# Patient Record
Sex: Female | Born: 1935
Health system: Southern US, Community
[De-identification: ages and names within clinical notes are randomized; demographics above are authoritative.]

## PROBLEM LIST (undated history)

## (undated) DIAGNOSIS — R7302 Impaired glucose tolerance (oral): Secondary | ICD-10-CM

## (undated) DIAGNOSIS — I700 Atherosclerosis of aorta: Secondary | ICD-10-CM

## (undated) DIAGNOSIS — R892 Abnormal level of other drugs, medicaments and biological substances in specimens from other organs, systems and tissues: Secondary | ICD-10-CM

## (undated) DIAGNOSIS — S2239XA Fracture of one rib, unspecified side, initial encounter for closed fracture: Secondary | ICD-10-CM

## (undated) DIAGNOSIS — M5136 Other intervertebral disc degeneration, lumbar region: Secondary | ICD-10-CM

## (undated) DIAGNOSIS — Z8719 Personal history of other diseases of the digestive system: Secondary | ICD-10-CM

## (undated) DIAGNOSIS — Z8673 Personal history of transient ischemic attack (TIA), and cerebral infarction without residual deficits: Secondary | ICD-10-CM

## (undated) DIAGNOSIS — M810 Age-related osteoporosis without current pathological fracture: Secondary | ICD-10-CM

## (undated) DIAGNOSIS — N393 Stress incontinence (female) (male): Secondary | ICD-10-CM

## (undated) DIAGNOSIS — J181 Lobar pneumonia, unspecified organism: Secondary | ICD-10-CM

## (undated) DIAGNOSIS — N184 Chronic kidney disease, stage 4 (severe): Secondary | ICD-10-CM

## (undated) DIAGNOSIS — F419 Anxiety disorder, unspecified: Secondary | ICD-10-CM

## (undated) DIAGNOSIS — I5189 Other ill-defined heart diseases: Secondary | ICD-10-CM

## (undated) DIAGNOSIS — R4189 Other symptoms and signs involving cognitive functions and awareness: Secondary | ICD-10-CM

## (undated) DIAGNOSIS — K5790 Diverticulosis of intestine, part unspecified, without perforation or abscess without bleeding: Secondary | ICD-10-CM

## (undated) DIAGNOSIS — K589 Irritable bowel syndrome without diarrhea: Secondary | ICD-10-CM

## (undated) DIAGNOSIS — J449 Chronic obstructive pulmonary disease, unspecified: Secondary | ICD-10-CM

## (undated) DIAGNOSIS — F32A Depression, unspecified: Secondary | ICD-10-CM

## (undated) DIAGNOSIS — M51369 Other intervertebral disc degeneration, lumbar region without mention of lumbar back pain or lower extremity pain: Secondary | ICD-10-CM

## (undated) DIAGNOSIS — K219 Gastro-esophageal reflux disease without esophagitis: Secondary | ICD-10-CM

## (undated) DIAGNOSIS — F329 Major depressive disorder, single episode, unspecified: Secondary | ICD-10-CM

## (undated) DIAGNOSIS — I10 Essential (primary) hypertension: Secondary | ICD-10-CM

## (undated) DIAGNOSIS — E785 Hyperlipidemia, unspecified: Secondary | ICD-10-CM

## (undated) HISTORY — DX: Chronic kidney disease, stage 4 (severe): N18.4

## (undated) HISTORY — DX: Chronic obstructive pulmonary disease, unspecified: J44.9

## (undated) HISTORY — DX: Stress incontinence (female) (male): N39.3

## (undated) HISTORY — PX: OTHER SURGICAL HISTORY: SHX169

## (undated) HISTORY — DX: Age-related osteoporosis without current pathological fracture: M81.0

## (undated) HISTORY — DX: Other ill-defined heart diseases: I51.89

## (undated) HISTORY — DX: Atherosclerosis of aorta: I70.0

## (undated) HISTORY — DX: Essential (primary) hypertension: I10

## (undated) HISTORY — DX: Hyperlipidemia, unspecified: E78.5

## (undated) HISTORY — DX: Impaired glucose tolerance (oral): R73.02

## (undated) HISTORY — DX: Major depressive disorder, single episode, unspecified: F32.9

## (undated) HISTORY — DX: Personal history of other diseases of the digestive system: Z87.19

## (undated) HISTORY — DX: Anxiety disorder, unspecified: F41.9

## (undated) HISTORY — DX: Other intervertebral disc degeneration, lumbar region: M51.36

## (undated) HISTORY — DX: Fracture of one rib, unspecified side, initial encounter for closed fracture: S22.39XA

## (undated) HISTORY — DX: Lobar pneumonia, unspecified organism: J18.1

## (undated) HISTORY — DX: Other intervertebral disc degeneration, lumbar region without mention of lumbar back pain or lower extremity pain: M51.369

## (undated) HISTORY — DX: Personal history of transient ischemic attack (TIA), and cerebral infarction without residual deficits: Z86.73

## (undated) HISTORY — DX: Irritable bowel syndrome, unspecified: K58.9

## (undated) HISTORY — DX: Depression, unspecified: F32.A

## (undated) HISTORY — DX: Diverticulosis of intestine, part unspecified, without perforation or abscess without bleeding: K57.90

## (undated) HISTORY — DX: Other symptoms and signs involving cognitive functions and awareness: R41.89

## (undated) HISTORY — DX: Gastro-esophageal reflux disease without esophagitis: K21.9

## (undated) HISTORY — DX: Abnormal level of other drugs, medicaments and biological substances in specimens from other organs, systems and tissues: R89.2

---
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
## 1960-01-19 HISTORY — PX: OTHER SURGICAL HISTORY: SHX169

## 1971-01-19 HISTORY — PX: TUBAL LIGATION: SHX77

## 1980-01-19 HISTORY — PX: BREAST ENHANCEMENT SURGERY: SHX7

## 1993-10-09 ENCOUNTER — Encounter: Payer: Self-pay | Admitting: Family Medicine

## 1994-01-18 HISTORY — PX: TOTAL ABDOMINAL HYSTERECTOMY: SHX209

## 1997-09-19 ENCOUNTER — Ambulatory Visit (HOSPITAL_COMMUNITY): Admission: RE | Admit: 1997-09-19 | Discharge: 1997-09-19 | Payer: Self-pay | Admitting: Internal Medicine

## 1998-09-30 ENCOUNTER — Ambulatory Visit (HOSPITAL_COMMUNITY): Admission: RE | Admit: 1998-09-30 | Discharge: 1998-09-30 | Payer: Self-pay | Admitting: Internal Medicine

## 1998-09-30 ENCOUNTER — Encounter: Payer: Self-pay | Admitting: Internal Medicine

## 2000-01-19 HISTORY — PX: COLONOSCOPY: SHX174

## 2000-03-09 ENCOUNTER — Ambulatory Visit (HOSPITAL_COMMUNITY): Admission: RE | Admit: 2000-03-09 | Discharge: 2000-03-09 | Payer: Self-pay | Admitting: Internal Medicine

## 2000-05-02 ENCOUNTER — Encounter: Payer: Self-pay | Admitting: Family Medicine

## 2000-05-03 ENCOUNTER — Ambulatory Visit (HOSPITAL_COMMUNITY): Admission: RE | Admit: 2000-05-03 | Discharge: 2000-05-03 | Payer: Self-pay | Admitting: Gastroenterology

## 2000-05-03 ENCOUNTER — Encounter: Payer: Self-pay | Admitting: Gastroenterology

## 2001-04-10 ENCOUNTER — Ambulatory Visit (HOSPITAL_COMMUNITY): Admission: RE | Admit: 2001-04-10 | Discharge: 2001-04-10 | Payer: Self-pay | Admitting: Internal Medicine

## 2002-01-07 ENCOUNTER — Encounter: Payer: Self-pay | Admitting: Internal Medicine

## 2002-01-07 ENCOUNTER — Encounter: Admission: RE | Admit: 2002-01-07 | Discharge: 2002-01-07 | Payer: Self-pay | Admitting: Internal Medicine

## 2002-04-30 ENCOUNTER — Encounter: Payer: Self-pay | Admitting: Internal Medicine

## 2002-04-30 ENCOUNTER — Ambulatory Visit (HOSPITAL_COMMUNITY): Admission: RE | Admit: 2002-04-30 | Discharge: 2002-04-30 | Payer: Self-pay | Admitting: Internal Medicine

## 2004-08-11 ENCOUNTER — Ambulatory Visit: Payer: Self-pay | Admitting: Internal Medicine

## 2004-08-17 ENCOUNTER — Ambulatory Visit: Payer: Self-pay | Admitting: Internal Medicine

## 2004-08-28 ENCOUNTER — Ambulatory Visit (HOSPITAL_COMMUNITY): Admission: RE | Admit: 2004-08-28 | Discharge: 2004-08-28 | Payer: Self-pay | Admitting: Internal Medicine

## 2005-09-06 ENCOUNTER — Ambulatory Visit: Payer: Self-pay | Admitting: Internal Medicine

## 2005-09-23 ENCOUNTER — Ambulatory Visit: Payer: Self-pay | Admitting: Internal Medicine

## 2005-09-23 ENCOUNTER — Ambulatory Visit (HOSPITAL_COMMUNITY): Admission: RE | Admit: 2005-09-23 | Discharge: 2005-09-23 | Payer: Self-pay | Admitting: Internal Medicine

## 2005-09-27 ENCOUNTER — Ambulatory Visit: Payer: Self-pay | Admitting: Internal Medicine

## 2006-10-04 ENCOUNTER — Ambulatory Visit: Payer: Self-pay | Admitting: Internal Medicine

## 2006-10-04 LAB — CONVERTED CEMR LAB
ALT: 21 units/L (ref 0–35)
Alkaline Phosphatase: 70 units/L (ref 39–117)
BUN: 21 mg/dL (ref 6–23)
Basophils Relative: 0.4 % (ref 0.0–1.0)
Bilirubin, Direct: 0.1 mg/dL (ref 0.0–0.3)
CO2: 25 meq/L (ref 19–32)
Calcium: 9.6 mg/dL (ref 8.4–10.5)
Cholesterol: 206 mg/dL (ref 0–200)
Creatinine, Ser: 1.3 mg/dL — ABNORMAL HIGH (ref 0.4–1.2)
Eosinophils Relative: 2 % (ref 0.0–5.0)
Glucose, Bld: 92 mg/dL (ref 70–99)
Hemoglobin: 13.3 g/dL (ref 12.0–15.0)
Lymphocytes Relative: 28.2 % (ref 12.0–46.0)
Monocytes Absolute: 0.6 10*3/uL (ref 0.2–0.7)
Monocytes Relative: 7.6 % (ref 3.0–11.0)
Potassium: 4.7 meq/L (ref 3.5–5.1)
RDW: 12.7 % (ref 11.5–14.6)
Total Bilirubin: 0.8 mg/dL (ref 0.3–1.2)
Total CHOL/HDL Ratio: 2.2
Total Protein: 6.3 g/dL (ref 6.0–8.3)
VLDL: 9 mg/dL (ref 0–40)
WBC: 7.8 10*3/uL (ref 4.5–10.5)

## 2006-10-07 ENCOUNTER — Ambulatory Visit (HOSPITAL_COMMUNITY): Admission: RE | Admit: 2006-10-07 | Discharge: 2006-10-07 | Payer: Self-pay | Admitting: Internal Medicine

## 2007-02-24 ENCOUNTER — Encounter: Payer: Self-pay | Admitting: Internal Medicine

## 2007-04-05 ENCOUNTER — Encounter: Payer: Self-pay | Admitting: Internal Medicine

## 2007-04-06 ENCOUNTER — Telehealth: Payer: Self-pay | Admitting: Internal Medicine

## 2007-05-01 ENCOUNTER — Telehealth: Payer: Self-pay | Admitting: Internal Medicine

## 2007-05-09 ENCOUNTER — Encounter: Payer: Self-pay | Admitting: Internal Medicine

## 2007-05-10 ENCOUNTER — Encounter: Payer: Self-pay | Admitting: Internal Medicine

## 2007-05-17 ENCOUNTER — Encounter: Payer: Self-pay | Admitting: Internal Medicine

## 2007-05-19 ENCOUNTER — Ambulatory Visit: Payer: Self-pay | Admitting: Internal Medicine

## 2007-05-19 DIAGNOSIS — G4700 Insomnia, unspecified: Secondary | ICD-10-CM

## 2007-05-19 DIAGNOSIS — E739 Lactose intolerance, unspecified: Secondary | ICD-10-CM

## 2007-05-19 DIAGNOSIS — M545 Low back pain: Secondary | ICD-10-CM

## 2007-05-19 DIAGNOSIS — M810 Age-related osteoporosis without current pathological fracture: Secondary | ICD-10-CM

## 2007-06-14 ENCOUNTER — Encounter: Payer: Self-pay | Admitting: Internal Medicine

## 2007-08-29 ENCOUNTER — Encounter: Payer: Self-pay | Admitting: Internal Medicine

## 2007-09-06 ENCOUNTER — Ambulatory Visit: Payer: Self-pay | Admitting: Vascular Surgery

## 2007-09-26 ENCOUNTER — Encounter: Payer: Self-pay | Admitting: Internal Medicine

## 2007-10-10 ENCOUNTER — Ambulatory Visit (HOSPITAL_COMMUNITY): Admission: RE | Admit: 2007-10-10 | Discharge: 2007-10-10 | Payer: Self-pay | Admitting: Internal Medicine

## 2007-12-05 ENCOUNTER — Encounter: Payer: Self-pay | Admitting: Family Medicine

## 2008-03-18 HISTORY — PX: OTHER SURGICAL HISTORY: SHX169

## 2008-04-10 ENCOUNTER — Ambulatory Visit: Payer: Self-pay | Admitting: Urology

## 2008-04-11 ENCOUNTER — Encounter: Payer: Self-pay | Admitting: Family Medicine

## 2008-04-15 ENCOUNTER — Ambulatory Visit: Payer: Self-pay | Admitting: Urology

## 2008-06-12 ENCOUNTER — Encounter: Payer: Self-pay | Admitting: Family Medicine

## 2008-06-13 ENCOUNTER — Encounter: Admission: RE | Admit: 2008-06-13 | Discharge: 2008-06-13 | Payer: Self-pay | Admitting: Family Medicine

## 2008-06-13 ENCOUNTER — Encounter: Payer: Self-pay | Admitting: Family Medicine

## 2008-07-04 ENCOUNTER — Encounter: Payer: Self-pay | Admitting: Family Medicine

## 2008-07-30 ENCOUNTER — Encounter: Payer: Self-pay | Admitting: Family Medicine

## 2008-08-14 ENCOUNTER — Encounter: Payer: Self-pay | Admitting: Family Medicine

## 2008-08-15 ENCOUNTER — Encounter: Payer: Self-pay | Admitting: Family Medicine

## 2008-08-19 ENCOUNTER — Ambulatory Visit: Payer: Self-pay | Admitting: Family Medicine

## 2008-08-19 LAB — CONVERTED CEMR LAB
ALT: 17 units/L (ref 0–35)
AST: 21 units/L (ref 0–37)
Basophils Relative: 1.3 % (ref 0.0–3.0)
Chloride: 110 meq/L (ref 96–112)
Eosinophils Relative: 4.5 % (ref 0.0–5.0)
GFR calc non Af Amer: 36.04 mL/min (ref >60)
HCT: 34.1 % — ABNORMAL LOW (ref 36.0–46.0)
Hemoglobin: 11.7 g/dL — ABNORMAL LOW (ref 12.0–15.0)
LDL Cholesterol: 96 mg/dL (ref 0–99)
Lymphs Abs: 2.3 10*3/uL (ref 0.7–4.0)
Monocytes Relative: 10.2 % (ref 3.0–12.0)
Neutro Abs: 3 10*3/uL (ref 1.4–7.7)
Platelets: 330 10*3/uL (ref 150.0–400.0)
Potassium: 5.2 meq/L — ABNORMAL HIGH (ref 3.5–5.1)
RBC: 3.68 M/uL — ABNORMAL LOW (ref 3.87–5.11)
Sodium: 142 meq/L (ref 135–145)
TSH: 3.49 microintl units/mL (ref 0.35–5.50)
Total Bilirubin: 0.7 mg/dL (ref 0.3–1.2)
Total CHOL/HDL Ratio: 2
Total Protein: 7.2 g/dL (ref 6.0–8.3)
VLDL: 10.2 mg/dL (ref 0.0–40.0)
WBC: 6.4 10*3/uL (ref 4.5–10.5)

## 2008-08-20 LAB — CONVERTED CEMR LAB: Vit D, 25-Hydroxy: 20 ng/mL — ABNORMAL LOW (ref 30–89)

## 2008-08-21 ENCOUNTER — Ambulatory Visit: Payer: Self-pay | Admitting: Family Medicine

## 2008-08-21 DIAGNOSIS — N189 Chronic kidney disease, unspecified: Secondary | ICD-10-CM

## 2008-08-21 DIAGNOSIS — D631 Anemia in chronic kidney disease: Secondary | ICD-10-CM

## 2008-08-21 DIAGNOSIS — E559 Vitamin D deficiency, unspecified: Secondary | ICD-10-CM

## 2008-09-03 ENCOUNTER — Encounter: Payer: Self-pay | Admitting: Family Medicine

## 2008-09-11 ENCOUNTER — Telehealth: Payer: Self-pay | Admitting: Family Medicine

## 2008-09-13 HISTORY — PX: OTHER SURGICAL HISTORY: SHX169

## 2008-09-25 ENCOUNTER — Ambulatory Visit: Payer: Self-pay | Admitting: Family Medicine

## 2008-09-25 LAB — CONVERTED CEMR LAB
Ketones, urine, test strip: NEGATIVE
Urobilinogen, UA: 0.2

## 2008-09-26 ENCOUNTER — Encounter (INDEPENDENT_AMBULATORY_CARE_PROVIDER_SITE_OTHER): Payer: Self-pay | Admitting: Internal Medicine

## 2008-10-01 ENCOUNTER — Telehealth (INDEPENDENT_AMBULATORY_CARE_PROVIDER_SITE_OTHER): Payer: Self-pay | Admitting: Internal Medicine

## 2008-10-02 ENCOUNTER — Ambulatory Visit: Payer: Self-pay | Admitting: Family Medicine

## 2008-10-03 ENCOUNTER — Encounter: Payer: Self-pay | Admitting: Family Medicine

## 2008-10-05 LAB — CONVERTED CEMR LAB
Basophils Relative: 1 % (ref 0.0–3.0)
Chloride: 113 meq/L — ABNORMAL HIGH (ref 96–112)
Eosinophils Relative: 6 % — ABNORMAL HIGH (ref 0.0–5.0)
Hgb A1c MFr Bld: 5.6 % (ref 4.6–6.5)
Lymphocytes Relative: 35.5 % (ref 12.0–46.0)
MCV: 95.6 fL (ref 78.0–100.0)
Neutrophils Relative %: 48.4 % (ref 43.0–77.0)
Potassium: 4.5 meq/L (ref 3.5–5.1)
RBC: 3.46 M/uL — ABNORMAL LOW (ref 3.87–5.11)
Sodium: 142 meq/L (ref 135–145)
Vitamin B-12: 458 pg/mL (ref 211–911)
WBC: 6 10*3/uL (ref 4.5–10.5)

## 2008-10-08 ENCOUNTER — Telehealth: Payer: Self-pay | Admitting: Family Medicine

## 2008-10-09 ENCOUNTER — Ambulatory Visit: Payer: Self-pay | Admitting: Family Medicine

## 2008-10-14 ENCOUNTER — Encounter: Payer: Self-pay | Admitting: Family Medicine

## 2008-10-30 ENCOUNTER — Ambulatory Visit: Payer: Self-pay | Admitting: Family Medicine

## 2008-11-19 ENCOUNTER — Ambulatory Visit (HOSPITAL_COMMUNITY): Admission: RE | Admit: 2008-11-19 | Discharge: 2008-11-19 | Payer: Self-pay | Admitting: Family Medicine

## 2008-12-16 ENCOUNTER — Encounter: Payer: Self-pay | Admitting: Family Medicine

## 2008-12-18 ENCOUNTER — Telehealth: Payer: Self-pay | Admitting: Family Medicine

## 2008-12-23 ENCOUNTER — Ambulatory Visit: Payer: Self-pay | Admitting: Family Medicine

## 2008-12-23 LAB — CONVERTED CEMR LAB
Basophils Relative: 1.7 % (ref 0.0–3.0)
Eosinophils Relative: 5.8 % — ABNORMAL HIGH (ref 0.0–5.0)
HCT: 33.3 % — ABNORMAL LOW (ref 36.0–46.0)
Lymphs Abs: 2.9 10*3/uL (ref 0.7–4.0)
MCV: 97.3 fL (ref 78.0–100.0)
Monocytes Absolute: 0.9 10*3/uL (ref 0.1–1.0)
Neutrophils Relative %: 37.6 % — ABNORMAL LOW (ref 43.0–77.0)
RBC: 3.42 M/uL — ABNORMAL LOW (ref 3.87–5.11)
Vitamin B-12: 400 pg/mL (ref 211–911)
WBC: 6.9 10*3/uL (ref 4.5–10.5)

## 2009-01-02 ENCOUNTER — Encounter: Payer: Self-pay | Admitting: Family Medicine

## 2009-01-21 ENCOUNTER — Encounter: Payer: Self-pay | Admitting: Family Medicine

## 2009-01-21 ENCOUNTER — Telehealth: Payer: Self-pay | Admitting: Family Medicine

## 2009-01-27 ENCOUNTER — Telehealth: Payer: Self-pay | Admitting: Family Medicine

## 2009-02-18 ENCOUNTER — Encounter: Payer: Self-pay | Admitting: Family Medicine

## 2009-03-04 ENCOUNTER — Telehealth: Payer: Self-pay | Admitting: Family Medicine

## 2009-03-17 ENCOUNTER — Telehealth: Payer: Self-pay | Admitting: Family Medicine

## 2009-04-02 ENCOUNTER — Telehealth: Payer: Self-pay | Admitting: Family Medicine

## 2009-06-02 ENCOUNTER — Telehealth: Payer: Self-pay | Admitting: Family Medicine

## 2009-07-04 ENCOUNTER — Telehealth: Payer: Self-pay | Admitting: Family Medicine

## 2009-08-14 ENCOUNTER — Encounter: Payer: Self-pay | Admitting: Family Medicine

## 2009-08-26 ENCOUNTER — Encounter (INDEPENDENT_AMBULATORY_CARE_PROVIDER_SITE_OTHER): Payer: Self-pay | Admitting: *Deleted

## 2009-09-09 ENCOUNTER — Ambulatory Visit: Payer: Self-pay | Admitting: Family Medicine

## 2009-09-10 LAB — CONVERTED CEMR LAB
ALT: 14 units/L (ref 0–35)
AST: 17 units/L (ref 0–37)
Albumin: 4.2 g/dL (ref 3.5–5.2)
BUN: 24 mg/dL — ABNORMAL HIGH (ref 6–23)
Basophils Relative: 1.2 % (ref 0.0–3.0)
Chloride: 104 meq/L (ref 96–112)
Eosinophils Relative: 3.2 % (ref 0.0–5.0)
HCT: 34.5 % — ABNORMAL LOW (ref 36.0–46.0)
Hemoglobin: 11.5 g/dL — ABNORMAL LOW (ref 12.0–15.0)
Lipase: 37 units/L (ref 11.0–59.0)
Lymphs Abs: 1.9 10*3/uL (ref 0.7–4.0)
MCV: 95.1 fL (ref 78.0–100.0)
Monocytes Absolute: 0.7 10*3/uL (ref 0.1–1.0)
Neutro Abs: 3.5 10*3/uL (ref 1.4–7.7)
Platelets: 283 10*3/uL (ref 150.0–400.0)
Potassium: 5 meq/L (ref 3.5–5.1)
Sodium: 140 meq/L (ref 135–145)
Total Bilirubin: 0.5 mg/dL (ref 0.3–1.2)
Total Protein: 6.4 g/dL (ref 6.0–8.3)
WBC: 6.4 10*3/uL (ref 4.5–10.5)

## 2009-09-15 ENCOUNTER — Telehealth: Payer: Self-pay | Admitting: Family Medicine

## 2009-09-23 ENCOUNTER — Ambulatory Visit: Payer: Self-pay | Admitting: Internal Medicine

## 2009-09-26 ENCOUNTER — Encounter: Payer: Self-pay | Admitting: Family Medicine

## 2009-09-29 ENCOUNTER — Telehealth (INDEPENDENT_AMBULATORY_CARE_PROVIDER_SITE_OTHER): Payer: Self-pay | Admitting: *Deleted

## 2009-10-20 ENCOUNTER — Telehealth (INDEPENDENT_AMBULATORY_CARE_PROVIDER_SITE_OTHER): Payer: Self-pay | Admitting: *Deleted

## 2009-10-20 ENCOUNTER — Telehealth: Payer: Self-pay | Admitting: Family Medicine

## 2009-11-27 ENCOUNTER — Telehealth: Payer: Self-pay | Admitting: Family Medicine

## 2009-12-01 ENCOUNTER — Ambulatory Visit: Payer: Self-pay | Admitting: Family Medicine

## 2009-12-01 DIAGNOSIS — N184 Chronic kidney disease, stage 4 (severe): Secondary | ICD-10-CM

## 2009-12-10 ENCOUNTER — Encounter (INDEPENDENT_AMBULATORY_CARE_PROVIDER_SITE_OTHER): Payer: Self-pay | Admitting: *Deleted

## 2009-12-23 ENCOUNTER — Ambulatory Visit: Payer: Self-pay | Admitting: Family Medicine

## 2009-12-25 LAB — CONVERTED CEMR LAB
Alkaline Phosphatase: 69 units/L (ref 39–117)
Basophils Relative: 1.4 % (ref 0.0–3.0)
Bilirubin, Direct: 0.1 mg/dL (ref 0.0–0.3)
Calcium: 9.2 mg/dL (ref 8.4–10.5)
Creatinine, Ser: 1.6 mg/dL — ABNORMAL HIGH (ref 0.4–1.2)
Eosinophils Absolute: 0.3 10*3/uL (ref 0.0–0.7)
GFR calc non Af Amer: 33.82 mL/min — ABNORMAL LOW (ref >60.00)
HDL: 74.8 mg/dL (ref >39.00)
Hemoglobin: 11.6 g/dL — ABNORMAL LOW (ref 12.0–15.0)
Lymphocytes Relative: 43.1 % (ref 12.0–46.0)
MCHC: 33.3 g/dL (ref 30.0–36.0)
MCV: 94.7 fL (ref 78.0–100.0)
Neutro Abs: 2.5 10*3/uL (ref 1.4–7.7)
RBC: 3.68 M/uL — ABNORMAL LOW (ref 3.87–5.11)
Sodium: 143 meq/L (ref 135–145)
Total Bilirubin: 0.8 mg/dL (ref 0.3–1.2)
Total CHOL/HDL Ratio: 3
Triglycerides: 50 mg/dL (ref 0.0–149.0)
VLDL: 10 mg/dL (ref 0.0–40.0)

## 2010-01-18 DIAGNOSIS — I5189 Other ill-defined heart diseases: Secondary | ICD-10-CM

## 2010-01-18 DIAGNOSIS — Z8673 Personal history of transient ischemic attack (TIA), and cerebral infarction without residual deficits: Secondary | ICD-10-CM

## 2010-01-18 HISTORY — DX: Personal history of transient ischemic attack (TIA), and cerebral infarction without residual deficits: Z86.73

## 2010-01-18 HISTORY — DX: Other ill-defined heart diseases: I51.89

## 2010-01-18 HISTORY — PX: ESOPHAGOGASTRODUODENOSCOPY: SHX1529

## 2010-02-07 ENCOUNTER — Encounter: Payer: Self-pay | Admitting: Family Medicine

## 2010-02-08 ENCOUNTER — Encounter: Payer: Self-pay | Admitting: Internal Medicine

## 2010-02-09 ENCOUNTER — Encounter: Payer: Self-pay | Admitting: Family Medicine

## 2010-02-16 ENCOUNTER — Encounter (INDEPENDENT_AMBULATORY_CARE_PROVIDER_SITE_OTHER): Payer: Self-pay | Admitting: *Deleted

## 2010-02-16 ENCOUNTER — Encounter: Payer: Self-pay | Admitting: Family Medicine

## 2010-02-16 LAB — CONVERTED CEMR LAB
ALT: 15 units/L
Alkaline Phosphatase: 61 units/L
CO2: 22 meq/L
Cholesterol: 185 mg/dL
LDL Cholesterol: 95 mg/dL
Sodium: 139 meq/L
Total Bilirubin: 0.3 mg/dL
Total Protein: 6.3 g/dL
Triglycerides: 56 mg/dL

## 2010-02-17 ENCOUNTER — Ambulatory Visit: Payer: Self-pay | Admitting: Cardiology

## 2010-02-19 NOTE — Letter (Signed)
Summary: Dr.Emilie Carp [facility],Urology,Note  Dr.Sabree Nuon Evans,Urology,Note   Imported By: Virgia Land 02/10/2009 09:27:33  _____________________________________________________________________  External Attachment:    Type:   Image     Comment:   External Document

## 2010-02-19 NOTE — Assessment & Plan Note (Signed)
Summary: ROA FOR 2 MONTH FOLLOW-UP/JRR   Vital Signs:  Patient profile:   77 year old female Height:      63 inches Weight:      123.25 pounds BMI:     21.91 Temp:     98.4 degrees F oral Pulse rate:   60 / minute Pulse rhythm:   regular BP sitting:   110 / 62  (left arm) Cuff size:   regular  Vitals Entered By: Ozzie Hoyle LPN (November 14, 624THL 12:26 PM) CC: 2 mont f/u   History of Present Illness: CC: f/u stomach, CPE  recent trip to [place] working on bylaws.    1. Stomache - seems better since on omeprazole.  thinks had virus for 1 mo, now better.  appetite returned in [place].  + soreness ribcage better after chiropractor put rib back in place and feeling better.  off omeprazole.  2. osteoporosis- takes sea calcium/vit D 1000IU.  last Dexa 2005 osteoporosis.  hasn't had vit D checked in a while.  3. HTN - per cards.  no HA, vision changes, chest pain, tightness, urinary changes, LE swelling.    4. HLD - very mild hx.  not on meds. takes coQ for gums/heart.    Preventative: flu shot UTD, tetanus shot UTD, pneumovax UTD, zostavax UTD. no paps - hysterectomy.  hasn't had recent pelvic. mammogram scheduled tomorrow.  -  Date:  10/25/2009    Flu vaccine given  Current Medications (verified): 1)  Zolpidem Tartrate 10 Mg  Tabs (Zolpidem Tartrate) .Marland Kitchen.. 1 By Mouth At Bedtime As Needed For Insomnia 2)  Alprazolam 0.25 Mg Tabs (Alprazolam) .... Take 1 Tablet By Mouth Twice A Day As Needed 3)  Gabapentin 300 Mg Caps (Gabapentin) .... Take 1 Capsule At Bedtime 4)  Vicodin 5-500 Mg  Tabs (Hydrocodone-Acetaminophen) .Marland Kitchen.. 1 Two Times A Day As Needed 5)  Bystolic 5 Mg Tabs (Nebivolol Hcl) .... 1/2 Daily 6)  Tekturna 150 Mg Tabs (Aliskiren Fumarate) .Marland Kitchen.. 1 Tablet Daily By Mouth 7)  Sea Calcium .Marland Kitchen.. 1 Per Patient Daily By Mouth 8)  Stomach Comfort .Marland KitchenMarland Kitchen. 1 Daily By Mouth 9)  Nutra-Calm .... Daily By Mouth 10)  Nopal .... 1 Capsules By Mouth 11)  Coq10 50 Mg Caps (Coenzyme Q10) ....  One Capsule Daily By Mouth 12)  Vitamin D 1000 Unit Tabs (Cholecalciferol) .... One Daily  Allergies: 1)  ! * Ramipril 2)  ! Doxycycline Monohydrate (Doxycycline Monohydrate)  Past History:  Past Medical History: Last updated: 08/20/2009 Diverticulitis, hx of Hyperlipidemia Anxiety Low back pain Osteoporosis Depression GERD IBS glucose intolerance Allergic rhinitis migraine Hypertension- followed by Dr. [place]  Past Surgical History: Last updated: 09/25/2009 Tonsillectomy [place] Submucous Resection 1962 Tubal ligation 1973 Breast implant 1982 ECHO Nml LVF  No Pulm Htn 10/09/93 Total Hysterectomy  For frequent dysmennorhea 1996 Colonoscopy  severe Diverticulosis (Dr Deatra Ina) 05/03/00 DEXA osteoporosis T -2.5 08/2003 Macroplastique implantation (Dr Rogers Blocker) 03/2008 Barium Swallow Swallowing Function nml  Marked Reflux 06/13/08 Uretherolysis and Removal of Macroplastique (Dr Amalia Hailey ) 09/13/08  Social History: Last updated: 09/23/2009 Widow third husband  Esoph Cancer 2002, first [place], second suicide.Marland KitchenMarland KitchenAF ([place]) Etoh   became Etohic Former Personal assistant Former Smoker Alcohol use-yes (1 1/2-2 glasses white wine/day) Participated in [facility] study PMH-FH-SH reviewed for relevance  Review of Systems       per HPI  Physical Exam  General:  Well-developed,well-nourished,in no acute distress; alert,appropriate and cooperative throughout examination Head:  Normocephalic and atraumatic without  obvious abnormalities. No apparent alopecia or balding. Eyes:  Conjunctiva clear bilaterally. PERRLA, EOMI.  s/p cataract removal Ears:  External ear exam shows no significant lesions or deformities.  Otoscopic examination reveals clear canals, tympanic membranes are intact bilaterally without bulging, retraction, inflammation or discharge. Hearing is grossly normal bilaterally. Nose:  External nasal examination shows no deformity or inflammation. Nasal mucosa are  pink and moist without lesions or exudates. Mouth:  Oral mucosa and oropharynx without lesions or exudates.  Teeth in good repair.  Neck:  No deformities, masses, or tenderness noted.  no bruits.  no LAD Lungs:  CTAB, no c/w Heart:  Normal rate and regular rhythm. S1 and S2 normal without gallop, murmur, click, rub or other extra sounds. Abdomen:  Bowel sounds positive, slightly hyperactive, abdomen soft and without masses, organomegaly or hernias noted.  Neg murphy, neg rebound.  + pain with deep palpation RUQ mostly.  no abd pulsatile mass or abd bruit Genitalia:  Pelvic Exam:        External: normal female genitalia without lesions or masses        Vagina: normal without lesions or masses        Cervix: normal without lesions or masses        Adnexa: normal bimanual exam without masses or fullness        Uterus: absent        Pap smear: not performed Msk:  No CVAT. Pulses:  2+ rad pulses Extremities:  No clubbing, cyanosis, edema, or deformity noted with normal full range of motion of all joints.   Neurologic:  No cranial nerve deficits noted. Station and gait are normal.  Sensory, motor and coordinative functions appear intact. Skin:  Intact without suspicious lesions or rashes Psych:  Cognition and judgment appear intact. Alert and cooperative with normal attention span and concentration. No apparent delusions, illusions, hallucinations   Impression & Recommendations:  Problem # 1:  ROUTINE GYNECOLOGICAL EXAMINATION (ICD-V72.31) done. no external lesions.  Problem # 2:  HEALTH MAINTENANCE EXAM (ICD-V70.0) protocols reviewed, immunizations UTD.  pelvic/breast today.  mammo tomorrow.  Problem # 3:  HYPERTENSION (ICD-401.9) per cards.  h/o Cr up to 1.8, recheck when returns fasting.  Her updated medication list for this problem includes:    Bystolic 5 Mg Tabs (Nebivolol hcl) .Marland Kitchen... 1/2 daily    Tekturna 150 Mg Tabs (Aliskiren fumarate) .Marland Kitchen... 1 tablet daily by mouth  BP today:  110/62 Prior BP: 124/70 (09/23/2009)  Labs Reviewed: K+: 5.0 (09/09/2009) Creat: : 1.8 (09/09/2009)   Chol: 190 (08/19/2008)   HDL: 83.50 (08/19/2008)   LDL: 96 (08/19/2008)   TG: 51.0 (08/19/2008)  Problem # 4:  OSTEOPOROSIS (ICD-733.00)  recheck Vit D.  taking sea calcium. Her updated medication list for this problem includes:    Vitamin D 1000 Unit Tabs (Cholecalciferol) ..... One daily  Bone Density: abnormal (08/22/2003) Vit D:28 (12/23/2008), 19 (10/02/2008)  Problem # 5:  RENAL INSUFFICIENCY (ICD-588.9) Cr 1.8 last check.  rpt today.  Problem # 6:  HYPERLIPIDEMIA, MILD NO MEDS (ICD-272.4) good control off meds.  very mild previously, now resolved.  good HDL.  if normal FLP, remove from list. Labs Reviewed: SGOT: 17 (09/09/2009)   SGPT: 14 (09/09/2009)   HDL:83.50 (08/19/2008), 87 (11/21/2007)  LDL:96 (08/19/2008), 81 (11/21/2007)  Chol:190 (08/19/2008), 183 (11/21/2007)  Trig:51.0 (08/19/2008), 74 (11/21/2007)  Complete Medication List: 1)  Zolpidem Tartrate 10 Mg Tabs (Zolpidem tartrate) .Marland Kitchen.. 1 by mouth at bedtime as needed for insomnia 2)  Alprazolam  0.25 Mg Tabs (Alprazolam) .... Take 1 tablet by mouth twice a day as needed 3)  Gabapentin 300 Mg Caps (Gabapentin) .... Take 1 capsule at bedtime 4)  Vicodin 5-500 Mg Tabs (Hydrocodone-acetaminophen) .Marland Kitchen.. 1 two times a day as needed 5)  Bystolic 5 Mg Tabs (Nebivolol hcl) .... 1/2 daily 6)  Tekturna 150 Mg Tabs (Aliskiren fumarate) .Marland Kitchen.. 1 tablet daily by mouth 7)  Sea Calcium  .Marland Kitchen.. 1 per patient daily by mouth 8)  Stomach Comfort  .Marland Kitchen.. 1 daily by mouth 9)  Nutra-calm  .... Daily by mouth 10)  Nopal  .... 1 capsules by mouth 11)  Coq10 50 Mg Caps (Coenzyme q10) .... One capsule daily by mouth 12)  Vitamin D 1000 Unit Tabs (Cholecalciferol) .... One daily  Patient Instructions: 1)  Return in next few weeks fasting for blood work (nothing to eat or drink after midnight).  [CMP, vit D, FLP, CBC, TSH V70.0, 401.9, 272.4,  733.0] 2)  Breast and pelvic exam today - everything looked normal and healthy. 3)  Mammogram scheduled tomorrow. 4)  Call us with questions.  Good to see you today. Prescriptions: VICODIN 5-500 MG  TABS (HYDROCODONE-ACETAMINOPHEN) 1 two times a day as needed  #60 x 1   Entered and Authorized by:   Ria Bush  MD   Signed by:   Ria Bush  MD on 12/01/2009   Method used:   Print then Give to Patient   RxID:   [id number] GABAPENTIN 300 MG CAPS (GABAPENTIN) Take 1 capsule at bedtime  #30 x 11   Entered and Authorized by:   Ria Bush  MD   Signed by:   Ria Bush  MD on 12/01/2009   Method used:   Print then Give to Patient   RxID:   [id number] ALPRAZOLAM 0.25 MG TABS (ALPRAZOLAM) Take 1 tablet by mouth twice a day as needed  #60 x 1   Entered and Authorized by:   Ria Bush  MD   Signed by:   Ria Bush  MD on 12/01/2009   Method used:   Print then Give to Patient   RxID:   [id number] ZOLPIDEM TARTRATE 10 MG  TABS (ZOLPIDEM TARTRATE) 1 by mouth at bedtime as needed for insomnia  #30 x 1   Entered and Authorized by:   Ria Bush  MD   Signed by:   Ria Bush  MD on 12/01/2009   Method used:   Print then Give to Patient   RxID:   [id number]    Orders Added: 1)  Est. Patient Level IV [id number]    Current Allergies (reviewed today): ! * RAMIPRIL ! DOXYCYCLINE MONOHYDRATE (DOXYCYCLINE MONOHYDRATE)   Prevention & Chronic Care Immunizations   Influenza vaccine: given  (10/25/2009)    Tetanus booster: 07/03/2002: given   Tetanus booster due: 07/02/2012    Pneumococcal vaccine: Pneumovax  (05/19/2007)   Pneumococcal vaccine deferral: Not indicated  (09/25/2009)   Pneumococcal vaccine due: 11/2005    H. zoster vaccine: 07/04/2009: Zostavax   H. zoster vaccine deferral: Not indicated  (09/25/2009)  Colorectal Screening   Hemoccult: Not documented   Hemoccult action/deferral: Not indicated  (09/25/2009)     Colonoscopy: Diverticulosis  (05/03/2000)   Colonoscopy due: 05/2010  Other Screening   Pap smear: Not documented   Pap smear action/deferral: Not indicated S/P hysterectomy  (09/25/2009)    Mammogram: ASSESSMENT: Negative - BI-RADS 1^MM [place]  (11/19/2008)   Mammogram due: 11/2009    DXA bone  density scan: abnormal  (08/22/2003)   DXA scan due: 08/2005    Smoking status: quit  (08/21/2008)  Lipids   Total Cholesterol: 190  (08/19/2008)   LDL: 96  (08/19/2008)   LDL Direct: 101.2  (10/04/2006)   HDL: 83.50  (08/19/2008)   Triglycerides: 51.0  (08/19/2008)    SGOT (AST): 17  (09/09/2009)   SGPT (ALT): 14  (09/09/2009)   Alkaline phosphatase: 58  (09/09/2009)   Total bilirubin: 0.5  (09/09/2009)  Hypertension   Last Blood Pressure: 110 / 62  (12/01/2009)   Serum creatinine: 1.8  (09/09/2009)   Serum potassium 5.0  (09/09/2009)  Self-Management Support :    Hypertension self-management support: Not documented    Lipid self-management support: Not documented

## 2010-02-19 NOTE — Progress Notes (Signed)
Summary: alprazolam   Phone Note Refill Request Message from:  Patient on November 27, 2009 10:39 AM  Refills Requested: Medication #1:  ALPRAZOLAM 0.25 MG TABS Take 1 tablet by mouth twice a day as needed   Last Refilled: 09/29/2009 Refill request from [facility] [address] [id number]  Initial call taken by: Lacretia Nicks,  November 27, 2009 10:40 AM  Follow-up for Phone Call        please call in. Follow-up by: Ria Bush  MD,  November 27, 2009 11:46 AM  Additional Follow-up for Phone Call Additional follow up Details #1::        Rx called in as directed.  Additional Follow-up by: Arnetha Courser CMA Deborra Medina),  November 27, 2009 1:59 PM    Prescriptions: ALPRAZOLAM 0.25 MG TABS (ALPRAZOLAM) Take 1 tablet by mouth twice a day as needed  #60 x 0   Entered and Authorized by:   Ria Bush  MD   Signed by:   Ria Bush  MD on 11/27/2009   Method used:   Telephoned to ...       [facility] [address] [phone]* (retail)       [address] [place], [place]  [place]:[id number]       Ph: [phone]       Fax: [phone]   RxID:   [id number]   Appended Document: alprazolam  Patient called regarding her xanx. She said that she went to pharmacy to pick up rx and they told her that they didn't have it, she says that she waited for 25 min before they told her that and she told them that she wouldn't be back. She wanted to let you know that she will just get a written script from you at her appt on Monday to take to a new pharmacy.

## 2010-02-19 NOTE — Letter (Signed)
Summary: [facility] / [place] [facility] [place] / [place] / DR. Herbie Baltimore EVANS   Imported By: Pilar Grammes 03/03/2009 10:55:11  _____________________________________________________________________  External Attachment:    Type:   Image     Comment:   External Document

## 2010-02-19 NOTE — Progress Notes (Signed)
Summary: Zolpidem  Phone Note Refill Request Message from:  La [place] on September 15, 2009 9:17 AM  Refills Requested: Medication #1:  ZOLPIDEM TARTRATE 10 MG  TABS 1 by mouth at bedtime as needed for insomnia [facility] [address] [place] [id number]*   Last Nicole English Date:  08/13/2009   Pharmacy Phone:  [phone]   Method Requested: Telephone to Pharmacy Initial call taken by: Christena Deem CMA Deborra Medina),  September 15, 2009 9:17 AM  Follow-up for Phone Call        Was this ok to refill x1? Follow-up by: Arnetha Courser CMA Deborra Medina),  September 15, 2009 10:04 AM  Additional Follow-up for Phone Call Additional follow up Details #1::        ok to refill x 5. Additional Follow-up by: Ria Bush  MD,  September 15, 2009 10:13 AM    Additional Follow-up for Phone Call Additional follow up Details #2::    Called in as directed Follow-up by: Arnetha Courser CMA Deborra Medina),  September 15, 2009 10:22 AM  Prescriptions: ZOLPIDEM TARTRATE 10 MG  TABS (ZOLPIDEM TARTRATE) 1 by mouth at bedtime as needed for insomnia  #30 x 5   Entered and Authorized by:   Ria Bush  MD   Signed by:   Ria Bush  MD on 09/15/2009   Method used:   Telephoned to ...       [facility] [address] [phone]* (retail)       [address] [place], [place]  [id number]       Ph: [phone]       Fax: [phone]   [place]:   [phone]

## 2010-02-19 NOTE — Letter (Signed)
Summary: Dr.Endre Coutts [facility]   Imported By: Virgia Land 02/24/2009 09:26:17  _____________________________________________________________________  External Attachment:    Type:   Image     Comment:   External Document

## 2010-02-19 NOTE — Progress Notes (Signed)
Summary: Pharmcy call ? DEA #  Phone Note From Pharmacy Call back at [phone]   [name]: [place]. [address] [phone]* Call For: Dr. Danise [name]  Summary of Call: Rx was called in this morning for a controlled substance and the DEA number is not matching up. Please call and talk with someone regarding this. Initial call taken by: Emelia Salisbury LPN,  October  3, 624THL 1:57 PM  Follow-up for Phone Call        Taken care of Follow-up by: Arnetha Courser CMA Deborra Medina),  October 20, 2009 2:03 PM

## 2010-02-19 NOTE — Progress Notes (Signed)
Summary: AMOXICILLIN  Phone Note Refill Request Message from:  Patient on January 21, 2009 4:40 PM  Refills Requested: Medication #1:  AMOXICILLIN 250 MG CAPS one tab by mouth 4 times a day. pt states she wants a refill just in case she gets a UTI, her cathether came out today. [facility] [place]   Method Requested: Electronic Initial call taken by: DeShannon Tamala Julian CMA Deborra Medina),  January 21, 2009 4:40 PM  Follow-up for Phone Call        Have her call Dr Amalia Hailey. I don't want to start this kind of prescribing. I'm not comfortable prescribing prophyklactically something done by someone else. Follow-up by: Raenette Rover MD,  January 21, 2009 4:46 PM  Additional Follow-up for Phone Call Additional follow up Details #1::        Patient notified as instructed by telephone. Patient states that she called Dr. Rebekah [place] office today and they wanted to give her what   they had given her before that made her sick. Patient states that she took the Amoxicillin for several days and then had to stop it for a few days and just started it back a few days ago and only has one left and really insisted on getting from you at least 2 more days worth of medicatoin to make sure that she does not get an infection.. Please advise. Additional Follow-up by: Sherrian Divers CMA Deborra Medina),  January 21, 2009 5:11 PM    Additional Follow-up for Phone Call Additional follow up Details #2::    Patient advised as instructed.  If she needs any additional refills, she will have to get them from Dr. Amalia Hailey. Follow-up by: Sherrian Divers CMA Deborra Medina),  January 21, 2009 5:19 PM  Prescriptions: AMOXICILLIN 250 MG CAPS (AMOXICILLIN) one tab by mouth 4 times a day.  #6 x 0   Entered and Authorized by:   Raenette Rover MD   Signed by:   Raenette Rover MD on 01/21/2009   Method used:   Electronically to        [facility]. [address] [phone]* (retail)       [address] [place], [place]  [place]:[id number]       Ph:  [phone]       Fax: [phone]   [place]:   [phone]

## 2010-02-19 NOTE — Assessment & Plan Note (Signed)
Summary: 2WK FOLLOW UP / LFW   Vital Signs:  Patient profile:   77 year old female Height:      63 inches Weight:      127.25 pounds BMI:     22.62 Temp:     97.6 degrees F oral Pulse rate:   60 / minute Pulse rhythm:   regular BP sitting:   124 / 70  (left arm) Cuff size:   regular  Vitals Entered By: Ozzie Hoyle LPN (September  6, 624THL 12:43 PM) CC: 2 week follow up   History of Present Illness: CC: 2wk f/u.  having sinus issues with weather change.  Stomach better with omeprazole - no longer having epigastric pain.  Still with right sided back pain mostly at night that seems to start in RUQ.  No nausea with this.  Hasn't noticed any types of foods that make it worse.  Also with bloating/gas that also has improved since starting omeprazole.  Endorses diet "all screwed up".  still missing meals, never has breakfast, averages 1 meal a day.  Has cut out all dairy because per patient doesn't digest milk protein well.  Allergies: 1)  ! * Ramipril 2)  ! Doxycycline Monohydrate (Doxycycline Monohydrate) PMH-FH-SH reviewed for relevance  Social History: Widow third husband  Esoph Cancer 2002, first [place], second suicide.Marland KitchenMarland KitchenAF ([place]) Etoh   became Etohic Former Personal assistant Former Smoker Alcohol use-yes (1 1/2-2 glasses white wine/day) Participated in [place] study  Physical Exam  General:  Well-developed,well-nourished,in no acute distress; alert,appropriate and cooperative throughout examination Abdomen:  Bowel sounds positive, slightly hyperactive, abdomen soft and without masses, organomegaly or hernias noted.  Neg murphy, neg rebound.  + pain with deep palpation RUQ mostly.  no abd pulsatile mass or abd bruit   Impression & Recommendations:  Problem # 1:  ABDOMINAL PAIN, LEFT UPPER QUADRANT (ICD-789.02) epigastric pain resolved with omeprazole.  likely attributable to GERD.  Still with persistent RUQ and R posterior back pain worse at night.  Could be gallbladder  pathology.  discussed this, pt prefers to wait and complete PPI course and will call us if not improved to schedule RUQ [place].  No early satiety.  No weight loss.  Problem # 2:  GERD (ICD-530.81) continue omeprazole for full 4 wk course.  then may stop and see if sxs return.  pt agrees to this. Her updated medication list for this problem includes:    Omeprazole 40 Mg Cpdr (Omeprazole) ..... One daily for 4 weeks  Complete Medication List: 1)  Zolpidem Tartrate 10 Mg Tabs (Zolpidem tartrate) .Marland Kitchen.. 1 by mouth at bedtime as needed for insomnia 2)  Alprazolam 0.25 Mg Tabs (Alprazolam) .... Take 1 tablet by mouth twice a day as needed 3)  Gabapentin 300 Mg Caps (Gabapentin) .... Take 1 capsule at bedtime 4)  Vicodin 5-500 Mg Tabs (Hydrocodone-acetaminophen) .Marland Kitchen.. 1 two times a day as needed 5)  Bystolic 5 Mg Tabs (Nebivolol hcl) .Marland Kitchen.. 1 two times a day 6)  Tekturna 150 Mg Tabs (Aliskiren fumarate) .Marland Kitchen.. 1 tablet daily by mouth 7)  Sea Calcium  .Marland Kitchen.. 1 per patient daily by mouth 8)  Stomach Comfort  .Marland Kitchen.. 1 daily by mouth 9)  Nutra-calm  .... Daily by mouth 10)  Nopal  .... 1 capsules by mouth 11)  Coq10 50 Mg Caps (Coenzyme q10) .... One capsule daily by mouth 12)  Omeprazole 40 Mg Cpdr (Omeprazole) .... One daily for 4 weeks 13)  Vitamin D 1000 Unit Tabs (Cholecalciferol) .Marland KitchenMarland KitchenMarland Kitchen  One daily  Patient Instructions: 1)  return in 1-2 months for complete physical 2)  Continue omeprazole daily for next 2 weeks. 3)  If not improving, call us and we will set up a liver/gallbladder ultrasound. 4)  More fiber in diet - fruits/vegetables/whole grains. 5)  Check on calcium supplement - goal is 1200mg /day.  cehck to make sure vit D is 1000IU daily. 6)  Pleasure to see you today. 7)  Call clinic with questions.    Current Allergies (reviewed today): ! * RAMIPRIL ! DOXYCYCLINE MONOHYDRATE (DOXYCYCLINE MONOHYDRATE)  Appended Document: old chart input    Clinical Lists Changes  Observations: Added new  observation of PAST SURG HX: Tonsillectomy [place] Submucous Resection 1962 Tubal ligation 1973 Breast implant 1982 ECHO Nml LVF  No Pulm Htn 10/09/93 Total Hysterectomy  For frequent dysmennorhea 1996 Colonoscopy  severe Diverticulosis (Dr Deatra Ina) 05/03/00 DEXA osteoporosis T -2.5 08/2003 Macroplastique implantation (Dr Rogers Blocker) 03/2008 Barium Swallow Swallowing Function nml  Marked Reflux 06/13/08 Uretherolysis and Removal of Macroplastique (Dr Amalia Hailey ) 09/13/08 (09/25/2009 20:32) Added new observation of PAPRECACT: Not indicated S/P hysterectomy (09/25/2009 20:32) Added new observation of HEMOCCRECACT: Not indicated (09/25/2009 20:32) Added new observation of ZOSTREFUSED: Not indicated (09/25/2009 20:32) Added new observation of PNEUVAXDECLN: Not indicated (09/25/2009 20:32) Added new observation of TDBOOSTDUE: 07/02/2012 (09/25/2009 20:32) Added new observation of DM PROGRESS: N/A (09/25/2009 20:32) Added new observation of DM FSREVIEW: N/A (09/25/2009 20:32) Added new observation of PLATELETK/UL: 220 K/uL (09/17/2009 20:32) Added new observation of MCV: 90.3 fL (09/17/2009 20:32) Added new observation of HCT: 38.4 % (09/17/2009 20:32) Added new observation of HGB: 12.7 g/dL (09/17/2009 20:32) Added new observation of RBC M/UL: 4.25 M/uL (09/17/2009 20:32) Added new observation of WBC COUNT: 7.0 10*3/microliter (09/17/2009 20:32) Added new observation of CALCIUM: 9.7 mg/dL (06/04/2009 20:32) Added new observation of CREATININE: 0.60 mg/dL (06/04/2009 20:32) Added new observation of BUN: 12 mg/dL (06/04/2009 20:32) Added new observation of BG RANDOM: 97 mg/dL (06/04/2009 20:32) Added new observation of CO2 PLSM/SER: 33 meq/L (06/04/2009 20:32) Added new observation of CL SERUM: 98 meq/L (06/04/2009 20:32) Added new observation of K SERUM: 3.6 meq/L (06/04/2009 20:32) Added new observation of NA: 139 meq/L (06/04/2009 20:32) Added new observation of BNP: 62   (06/04/2009 20:32) Added new observation of CALCIUM: 10.0 mg/dL (03/04/2009 20:32) Added new observation of CREATININE: 0.70 mg/dL (03/04/2009 20:32) Added new observation of BUN: 16 mg/dL (03/04/2009 20:32) Added new observation of BG RANDOM: 101 mg/dL (03/04/2009 20:32) Added new observation of CL SERUM: 99 meq/L (03/04/2009 20:32) Added new observation of K SERUM: 3.6 meq/L (03/04/2009 20:32) Added new observation of NA: 139 meq/L (03/04/2009 20:32) Added new observation of LDL: 144 mg/dL (03/04/2009 20:32) Added new observation of HDL: 95 mg/dL (03/04/2009 20:32) Added new observation of TRIGLYC TOT: 93 mg/dL (03/04/2009 20:32) Added new observation of CHOLESTEROL: 266 mg/dL (03/04/2009 20:32) Added new observation of TSH: 3.02 microintl units/mL (10/04/2006 20:32) Added new observation of WBC COUNT: 7.8 10*3/microliter (10/04/2006 20:32) Added new observation of TD BOOSTER: given  (07/03/2002 20:35)       Prevention & Chronic Care Immunizations   Influenza vaccine: Fluvax 3+  (10/30/2008)    Tetanus booster: 07/03/2002: given   Tetanus booster due: 07/02/2012    Pneumococcal vaccine: Pneumovax  (05/19/2007)   Pneumococcal vaccine deferral: Not indicated  (09/25/2009)   Pneumococcal vaccine due: 11/2005    H. zoster vaccine: 07/04/2009: Zostavax   H. zoster vaccine deferral: Not indicated  (09/25/2009)  Colorectal Screening  Hemoccult: Not documented   Hemoccult action/deferral: Not indicated  (09/25/2009)    Colonoscopy: Diverticulosis  (05/03/2000)   Colonoscopy due: 05/2010  Other Screening   Pap smear: Not documented   Pap smear action/deferral: Not indicated S/P hysterectomy  (09/25/2009)    Mammogram: ASSESSMENT: Negative - BI-RADS 1^MM DIGITAL SCREENING W/ IMPLANTS  (11/19/2008)   Mammogram due: 11/2009    DXA bone density scan: abnormal  (08/22/2003)   DXA scan due: 08/2005    Smoking status: quit  (08/21/2008)  Lipids   Total Cholesterol: 266   (03/04/2009)   LDL: 144  (03/04/2009)   LDL Direct: 101.2  (10/04/2006)   HDL: 95  (03/04/2009)   Triglycerides: 93  (03/04/2009)    SGOT (AST): 17  (09/09/2009)   SGPT (ALT): 14  (09/09/2009)   Alkaline phosphatase: 58  (09/09/2009)   Total bilirubin: 0.5  (09/09/2009)  Hypertension   Last Blood Pressure: 124 / 70  (09/23/2009)   Serum creatinine: 1.8  (09/09/2009)   Serum potassium 5.0  (09/09/2009)  Self-Management Support :    Hypertension self-management support: Not documented    Lipid self-management support: Not documented      Past History:  Past Surgical History: Tonsillectomy [place] Submucous Resection 1962 Tubal ligation 1973 Breast implant 1982 ECHO Nml LVF  No Pulm Htn 10/09/93 Total Hysterectomy  For frequent dysmennorhea 1996 Colonoscopy  severe Diverticulosis (Dr Deatra Ina) 05/03/00 DEXA osteoporosis T -2.5 08/2003 Macroplastique implantation (Dr Rogers Blocker) 03/2008 Barium Swallow Swallowing Function nml  Marked Reflux 06/13/08 Uretherolysis and Removal of Macroplastique (Dr Amalia Hailey ) 09/13/08   -  Date:  07/03/2002    TD booster given

## 2010-02-19 NOTE — Letter (Signed)
Summary: [place]   Imported By: Edmonia James 01/21/2009 11:59:33  _____________________________________________________________________  External Attachment:    Type:   Image     Comment:   External Document

## 2010-02-19 NOTE — Letter (Signed)
Summary: Dr.Tannisha Kennington Evans,[facility],Note  Dr.Paizleigh Wilds Evans,[facility]   Imported By: Virgia Land 02/25/2009 14:00:11  _____________________________________________________________________  External Attachment:    Type:   Image     Comment:   External Document

## 2010-02-19 NOTE — Progress Notes (Signed)
Summary: refill request for alprazolam  Phone Note Refill Request Message from:  Fax from Pharmacy  Refills Requested: Medication #1:  ALPRAZOLAM 0.25 MG TABS Take 1 tablet by mouth twice a day as needed   Last Refilled: 02/06/2009 Faxed refill [facility] from [facility] [address]  [phone].  Initial call taken by: Marty Heck CMA,  April 02, 2009 11:33 AM  Follow-up for Phone Call        [facility] Follow-up by: Marty Heck CMA,  April 02, 2009 12:26 PM    Prescriptions: ALPRAZOLAM 0.25 MG TABS (ALPRAZOLAM) Take 1 tablet by mouth twice a day as needed  #60 x 0   Entered and Authorized by:   Raenette Rover MD   Signed by:   Raenette Rover MD on 04/02/2009   Method used:   Telephoned to ...         [id number]

## 2010-02-19 NOTE — Letter (Signed)
Summary: Anabel Halon letter  [place] at [facility]  [address] [place], [place] [address]   Phone: [phone]  Fax: [phone]       08/26/2009 MRN: [id number]  [facility] [address] [place], [place]  [address]  Dear Ms. Charolotte Eke [place], and [place] announce the retirement of Modesto Charon, M.D., from full-time practice at the [facility] office effective July 17, 2009 and his plans of returning part-time.  It is important to Dr. Council Mechanic and to our practice that you understand that [facility] has seven physicians in our office for your health care needs.  We will continue to offer the same exceptional care that you have today.    Dr. Council Mechanic has spoken to many of you about his plans for retirement and returning part-time in the fall.   We will continue to work with you through the transition to schedule appointments for you in the office and meet the high standards that [place] is committed to.   Again, it is with great pleasure that we share the news that Dr. Council Mechanic will return to [facility] in October of 2011 with a reduced schedule.    If you have any questions, or would like to request an appointment with one of our physicians, please call us at [phone] and press the option for Scheduling an appointment.  We take pleasure in providing you with excellent patient care and look forward to seeing you at your next office visit.  [facility] are:  Viviana Simpler, M.D. Teresa Pelton, M.D. Loura Pardon, M.D. Eliezer Lofts, M.D. Owens Loffler, M.D. Arnette Norris, M.D. We proudly welcomed Renford Dills, M.D. and Ria Bush, M.D. to the practice in July/August 2011.  Sincerely,  [facility]

## 2010-02-19 NOTE — Progress Notes (Signed)
Summary: ALPRAZOLAM 0.25 MG TABS   Phone Note Refill Request Message from:  Patient on January 27, 2009 10:37 AM  Refills Requested: Medication #1:  ALPRAZOLAM 0.25 MG TABS Take 1 tablet by mouth twice a day as needed Patient would like this called in to [facility] [place].   Initial call taken by: Lacretia Nicks,  January 27, 2009 10:38 AM  Follow-up for Phone Call        px written on EMR for call in  Follow-up by: Allena Earing MD,  January 27, 2009 11:03 AM  Additional Follow-up for Phone Call Additional follow up Details #1::        [facility] Additional Follow-up by: Marty Heck CMA,  January 27, 2009 11:29 AM    New/Updated Medications: ALPRAZOLAM 0.25 MG TABS (ALPRAZOLAM) Take 1 tablet by mouth twice a day as needed Prescriptions: ALPRAZOLAM 0.25 MG TABS (ALPRAZOLAM) Take 1 tablet by mouth twice a day as needed  #60 x 0   Entered and Authorized by:   Allena Earing MD   Signed by:   Marty Heck CMA on 01/27/2009   Method used:   Telephoned to ...       [facility] [address] [phone]* (retail)       [address] [place], [place]  [place]:[id number]       Ph: [phone]       Fax: [phone]   [place]:   [phone]

## 2010-02-19 NOTE — Progress Notes (Signed)
Summary: Xanax refill request  Phone Note Refill Request Call back at [phone] [facility]) Message from:  [facility] on September 29, 2009 8:40 AM  Refills Requested: Medication #1:  ALPRAZOLAM 0.25 MG TABS Take 1 tablet by mouth twice a day as needed   Last Refilled: 08/03/2009 Electronic request for Xanax. Okay to refill? Please advise   Method Requested: Electronic Initial call taken by: Arnetha Courser CMA Deborra Medina),  September 29, 2009 8:41 AM  Follow-up for Phone Call        ok to refill #60, no refills. Follow-up by: Ria Bush  MD,  September 29, 2009 10:20 AM  Additional Follow-up for Phone Call Additional follow up Details #1::        thanks. Additional Follow-up by: Ria Bush  MD,  September 29, 2009 11:02 AM    Additional Follow-up for Phone Call Additional follow up Details #2::    Rx called in as directed Follow-up by: Arnetha Courser CMA Deborra Medina),  September 29, 2009 11:03 AM  Prescriptions: ALPRAZOLAM 0.25 MG TABS (ALPRAZOLAM) Take 1 tablet by mouth twice a day as needed  #60 x 0   Entered and Authorized by:   Arnetha Courser CMA ([place])   Signed by:   Arnetha Courser CMA ([place]) on 09/29/2009   Method used:   Telephoned to ...       [facility] [address] [phone]* (retail)       [address] [place], [place]  [place]:[id number]       Ph: [phone]       Fax: [phone]   [place]:   [phone]

## 2010-02-19 NOTE — Letter (Signed)
Summary: [facility]   Imported By: Edmonia James 08/20/2009 10:41:30  _____________________________________________________________________  External Attachment:    Type:   Image     Comment:   External Document  Appended Document: [facility]    Clinical Lists Changes  Observations: Added new observation of PAST MED HX: Diverticulitis, hx of Hyperlipidemia Anxiety Low back pain Osteoporosis Depression GERD IBS glucose intolerance Allergic rhinitis migraine Hypertension- followed by Dr. Martinique  (08/20/2009 11:16)       Past History:  Past Medical History: Diverticulitis, hx of Hyperlipidemia Anxiety Low back pain Osteoporosis Depression GERD IBS glucose intolerance Allergic rhinitis migraine Hypertension- followed by Dr. Martinique

## 2010-02-19 NOTE — Progress Notes (Signed)
Summary: Rx Alprazolam  Phone Note Refill Request Call back at [phone] Message from:  [facility] on Jun 02, 2009 8:08 AM  Refills Requested: Medication #1:  ALPRAZOLAM 0.25 MG TABS Take 1 tablet by mouth twice a day as needed   Last Refilled: 04/02/2009  Method Requested: Telephone to Pharmacy Initial call taken by: Emelia Salisbury LPN,  May 16, 624THL [id number] AM  Follow-up for Phone Call        Rx called to pharmacy Follow-up by: Emelia Salisbury LPN,  May 16, 624THL [id number] AM    Prescriptions: ALPRAZOLAM 0.25 MG TABS (ALPRAZOLAM) Take 1 tablet by mouth twice a day as needed  #60 x 1   Entered and Authorized by:   Raenette Rover MD   Signed by:   Raenette Rover MD on 06/02/2009   Method used:   Telephoned to ...       [facility] [address] [phone]* (retail)       [address] [place], [place]  [id number]       Ph: [phone]       Fax: [phone]   [place]:   [phone]

## 2010-02-19 NOTE — Progress Notes (Signed)
Summary: Rx Vicodin  Phone Note Refill Request Call back at [phone] Message from:  [facility] on March 04, 2009 7:56 AM  Refills Requested: Medication #1:  VICODIN 5-500 MG  TABS 1 two times a day as needed   Last Refilled: 01/27/2009 Received e-scribe refill request.   Method Requested: Telephone to Pharmacy Initial call taken by: Emelia Salisbury LPN,  February 15, 624THL 7:56 AM  Follow-up for Phone Call        Rx called to [facility]. Follow-up by: Emelia Salisbury LPN,  February 15, 624THL 9:44 AM    Prescriptions: VICODIN 5-500 MG  TABS (HYDROCODONE-ACETAMINOPHEN) 1 two times a day as needed  #60 x 5   Entered and Authorized by:   Raenette Rover MD   Signed by:   Raenette Rover MD on 03/04/2009   Method used:   Telephoned to ...       [facility] [address] [phone]* (retail)       [address] [place], [place]  [id number]       Ph: [phone]       Fax: [phone]   [place]:   [phone]

## 2010-02-19 NOTE — Assessment & Plan Note (Signed)
Summary: DIGESTIVE PROBLEMS/SCHALLER/CLE   Vital Signs:  Patient profile:   77 year old female Height:      63 inches Weight:      124.75 pounds BMI:     22.18 Temp:     97.9 degrees F oral Pulse rate:   60 / minute Pulse rhythm:   regular BP sitting:   122 / 64  (left arm) Cuff size:   regular  Vitals Entered By: Maudie Mercury Dance CMA Deborra Medina) (September 09, 2009 11:21 AM) CC: GI issues (Gas/pain)   History of Present Illness: CC: GI issues  1 mo h/o epigastric pain, bloating with gas, trouble laying flat at night.  Dull gnaw epigastrically, no radiation.  Has tried "stomach comfort" with mild improvement, especially of gas.  Nothing worsening, hasn't noticed any relation to foods.  Doesn't think has good diet.  Doesn't remember to eat, has about 1 meal a day.  Generally pain starts after noon.  Not like hunger pains.  Main issue is gas discomfort/bloating.  No heartburn or ST.  + occasional cough and hoarseness in AM.  Also notes trouble swallowing with pills especially later in evening.  Had swallow eval at 10am WNL, pt feels not thorough eval 2/2 sxs presenting at night.  review of records show normal swallow, marked reflux.  No fevers/chills, n/v/d/c.  Does have back and neck pain intermittently (chronic).  No weight changes.  No early satiety.  appetite OK.  In shingles study for booster, received it 06/2009.  nonsmoker.  + EtOH (about 1 1/2 to 2 glasses white wine/day).  weight stable.  Allergies: 1)  ! * Ramipril 2)  ! Doxycycline Monohydrate (Doxycycline Monohydrate)  Past History:  Past Medical History: Last updated: 08/20/2009 Diverticulitis, hx of Hyperlipidemia Anxiety Low back pain Osteoporosis Depression GERD IBS glucose intolerance Allergic rhinitis migraine Hypertension- followed by Dr. Martinique  Past Surgical History: Last updated: 10/30/2008 Tonsillectomy [place] Submucous Resection 1962 Tubal ligation 1973 Breast implant 1982 ECHO  Nml LVF  No Pulm Htn 10/09/93 Total Hysterectomy  For frequent dysmennorhea 1996 Colonoscopy  Divertics (Dr Deatra Ina) 05/03/00 Macroplastique implantation (Dr Rogers Blocker) 03/2008 Barium Swallow Swallowing Function nml  Marked Reflux 06/13/08 Uretherolysis and Removal of Macroplastique (Dr Amalia Hailey ) 09/13/08  Social History: Last updated: 08/21/2008 Widow third husband  Esoph Cancer 2002, first [place], second suicide.Marland KitchenMarland KitchenAF ([place]) Etoh   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 No apparent alopecia or balding. Eyes:  Conjunctiva clear bilaterally. PERRLA, EOMI. Ears:  External ear exam shows no significant lesions or deformities.  Otoscopic examination reveals clear canals, tympanic membranes are intact bilaterally without bulging, retraction, inflammation or discharge. Hearing is grossly normal bilaterally. Nose:  External nasal examination shows no deformity or inflammation. Nasal mucosa are pink and moist without lesions or exudates. Mouth:  Oral mucosa and oropharynx without lesions or exudates.  Teeth in good repair. Mild white PND. Neck:  No deformities, masses, or tenderness noted. Lungs:  Fine ronchi in RLL. No wheezing heard, o/w clear. Heart:  Normal rate and regular rhythm. S1 and S2 normal without gallop, murmur, click, rub or other extra sounds. Abdomen:  Bowel sounds positive, slightly hyperactive, abdomen soft and without masses, organomegaly or hernias noted.  Neg murphy, neg rebound.  +  pain with deep palpation RUQ mostly.  no abd pulsatile mass or abd bruit Pulses:  2+ periph pulses   Impression & Recommendations:  Problem # 1:  ABDOMINAL PAIN, EPIGASTRIC (ICD-789.06) with bloating and  gas.  gastritis vs gallbladder.  More likely gastritis.  treat with PPI for 3 wks, RTC for f/u. If not improved, RUQ [place] to eval stones.  check basic labwork.  Red flags to seek urgent care discussed.  alternatively could be flare of IBS (in chart, however pt says may have but never told had this).  rec decrease EtOh intake.  reviewed ingredients of stomach comfort to include CaHCO3, alginic acid, slippery elm bark, ginger root, papaya fruit, licorice root.  Orders: TLB-BMP (Basic Metabolic Panel-BMET) (99991111) TLB-CBC Platelet - w/Differential (85025-CBCD) TLB-Hepatic/Liver Function Pnl (80076-HEPATIC) TLB-Lipase (83690-LIPASE) Prescription Created Electronically [id number])  Problem # 2:  HYPERTENSION (ICD-401.9) due for blood work, checked to follow Cr.  The following medications were removed from the medication list:    Hydrochlorothiazide 12.5 Mg Caps (Hydrochlorothiazide) .Marland Kitchen... 1 capsule daily as needed    Lasix 20 Mg Tabs (Furosemide) ..... One tab by mouth daily for five days. Her updated medication list for this problem includes:    Bystolic 5 Mg Tabs (Nebivolol hcl) .Marland Kitchen... 1 two times a day    Tekturna 150 Mg Tabs (Aliskiren fumarate) .Marland Kitchen... 1 tablet daily by mouth  BP today: 122/64 Prior BP: 130/64 (12/23/2008)  Labs Reviewed: K+: 4.5 (10/02/2008) Creat: : 1.5 (10/02/2008)   Chol: 190 (08/19/2008)   HDL: 83.50 (08/19/2008)   LDL: 96 (08/19/2008)   TG: 51.0 (08/19/2008)  Complete Medication List: 1)  Zolpidem Tartrate 10 Mg Tabs (Zolpidem tartrate) .Marland Kitchen.. 1 by mouth at bedtime as needed for insomnia 2)  Alprazolam 0.25 Mg Tabs (Alprazolam) .... Take 1 tablet by mouth twice a day as needed 3)  Gabapentin 300 Mg Caps (Gabapentin) .... Take 1 capsule at bedtime 4)  Vicodin 5-500 Mg Tabs (Hydrocodone-acetaminophen) .Marland Kitchen.. 1 two times a day as needed 5)  Bystolic 5 Mg Tabs (Nebivolol hcl) .Marland Kitchen.. 1 two times a day 6)  Tekturna 150 Mg Tabs (Aliskiren fumarate) .Marland Kitchen.. 1 tablet daily by  mouth 7)  Sea Calcium  .Marland Kitchen.. 1 per patient daily by mouth 8)  Stomach Comfort  .Marland Kitchen.. 1 daily by mouth 9)  Nutra-calm  .... Daily by mouth 10)  Nopal  .... 1 capsules by mouth 11)  Coq10 50 Mg Caps (Coenzyme q10) .... 75 mg daily by mouth 12)  Omeprazole 40 Mg Cpdr (Omeprazole) .... One daily for 4 weeks  Patient Instructions: 1)  This sounds like gastritis.  Treat with omeprazole 40mg  daily for next 3 weeks. 2)  Return in 2-3 weeks for follow up. 3)  Continue stomach comfort or Gas-X for symptomatic relief. 4)  If you start having high fevers >101.5, worsening abdominal pain, nausea or vomiting, or not improving as expected, please return to be seen sooner. 5)  Pleasure to meet you today. Prescriptions: OMEPRAZOLE 40 MG CPDR (OMEPRAZOLE) one daily for 4 weeks  #30 x 1   Entered and Authorized by:   Ria Bush  MD   Signed by:   Ria Bush  MD on 09/09/2009   Method used:   Electronically to        [facility]. [address] [phone]* (retail)       [address] [place], [place]  [id number]       Ph: [phone]  Fax: [phone]   [id number]   Current Allergies (reviewed today): ! * RAMIPRIL ! DOXYCYCLINE MONOHYDRATE (DOXYCYCLINE MONOHYDRATE)

## 2010-02-19 NOTE — Progress Notes (Signed)
Summary: Excessive thrist  Phone Note Call from Patient Call back at Home Phone [phone]   [name]: Patient Call For: Raenette Rover MD Summary of Call: Patient is having some thirst issues, some nausea, no vomiting, no diarrhea, some dizziness, frequent urination, abdomnal pain on yesterday and wanted to know what to do.  Hydrated herself all day yesterday, today dry tongue but she feels better.  No change in medications recently.  Has not had blood work done in a while.  Please advise.   Initial call taken by: Sherrian Divers CMA Deborra Medina),  March 17, 2009 9:38 AM  Follow-up for Phone Call        No FH DM and sugar very nml 9/10. Think little chance diabetes. Perhaps all her urology work has changed her fluid setpoint. If dry mouth continues another week or so, come in after labwork. Follow-up by: Raenette Rover MD,  March 17, 2009 9:46 AM  Additional Follow-up for Phone Call Additional follow up Details #1::        Left message on voicemail for patient to return call.  Sherrian Divers CMA Deborra Medina)  March 17, 2009 10:19 AM   Patient notified as instructed.   Additional Follow-up by: Sherrian Divers CMA Deborra Medina),  March 17, 2009 10:22 AM

## 2010-02-19 NOTE — Miscellaneous (Signed)
  Clinical Lists Changes  Observations: Removed observation of PLATELETK/UL: 220 K/uL (09/17/2009 20:32) Removed observation of MCV: 90.3 fL (09/17/2009 20:32) Removed observation of HCT: 38.4 % (09/17/2009 20:32) Removed observation of HGB: 12.7 g/dL (09/17/2009 20:32) Removed observation of RBC M/UL: 4.25 M/uL (09/17/2009 20:32) Removed observation of WBC COUNT: 7.0 10*3/microliter (09/17/2009 20:32) Removed observation of NA: 139 meq/L (06/04/2009 20:32) Removed observation of K SERUM: 3.6 meq/L (06/04/2009 20:32) Removed observation of CREATININE: 0.60 mg/dL (06/04/2009 20:32) Removed observation of CO2 PLSM/SER: 33 meq/L (06/04/2009 20:32) Removed observation of CL SERUM: 98 meq/L (06/04/2009 20:32) Removed observation of CALCIUM: 9.7 mg/dL (06/04/2009 20:32) Removed observation of BUN: 12 mg/dL (06/04/2009 20:32) Removed observation of BG RANDOM: 97 mg/dL (06/04/2009 20:32) Removed observation of BNP: 62  (06/04/2009 20:32) Removed observation of NA: 139 meq/L (03/04/2009 20:32) Removed observation of K SERUM: 3.6 meq/L (03/04/2009 20:32) Removed observation of CREATININE: 0.70 mg/dL (03/04/2009 20:32) Removed observation of CL SERUM: 99 meq/L (03/04/2009 20:32) Removed observation of CALCIUM: 10.0 mg/dL (03/04/2009 20:32) Removed observation of BUN: 16 mg/dL (03/04/2009 20:32) Removed observation of BG RANDOM: 101 mg/dL (03/04/2009 20:32) Removed observation of LDL: 144 mg/dL (03/04/2009 20:32) Removed observation of HDL: 95 mg/dL (03/04/2009 20:32) Removed observation of TRIGLYC TOT: 93 mg/dL (03/04/2009 20:32) Removed observation of CHOLESTEROL: 266 mg/dL (03/04/2009 20:32)

## 2010-02-19 NOTE — Letter (Signed)
Summary: [place] No Show Letter  [place] at [facility]  [address] [place], [place] [address]   Phone: [phone]  Fax: [phone]    12/10/2009 MRN: [id number]  [facility] [address] [place], [place]  [address]   Dear Nicole English,   Our records indicate that you missed your scheduled appointment with ____lab_________________ on __11.23.11__________.  Please contact this office to reschedule your appointment as soon as possible.  It is important that you keep your scheduled appointments with your physician, so we can provide you the best care possible.  Please be advised that there may be a charge for "no show" appointments.    Sincerely,    at [facility]

## 2010-02-19 NOTE — Progress Notes (Signed)
Summary: Rx Zolpidem  Phone Note Refill Request Call back at [phone] Message from:  [facility] on March 17, 2009 8:21 AM  Refills Requested: Medication #1:  ZOLPIDEM TARTRATE 10 MG  TABS 1 by mouth at bedtime as needed for insomnia   Last Refilled: 02/16/2009  Method Requested: Electronic Initial call taken by: Emelia Salisbury LPN,  February 28, 624THL 8:22 AM    Prescriptions: ZOLPIDEM TARTRATE 10 MG  TABS (ZOLPIDEM TARTRATE) 1 by mouth at bedtime as needed for insomnia  #30 x 5   Entered and Authorized by:   Raenette Rover MD   Signed by:   Raenette Rover MD on 03/17/2009   Method used:   Telephoned to ...       [facility] [address] [phone]* (retail)       [address] [place], [place]  [id number]       Ph: [phone]       Fax: [phone]   [place]:   [phone]

## 2010-02-19 NOTE — Progress Notes (Signed)
Summary: pt was given zostavax  Phone Note Call from Patient   [name]: Patient Call For: Nicole Rover MD Summary of Call: Pt states she is involved in a shingles study and was given a zostavax booster on 07/01/09. I dont know where to list this in emr. Initial call taken by: Marty Heck CMA,  July 04, 2009 8:54 AM      Other Immunization History:    Zostavax # 1:  Zostavax (07/04/2009)

## 2010-02-19 NOTE — Letter (Signed)
Summary: [place]   Imported By: Edmonia James 01/31/2009 13:45:14  _____________________________________________________________________  External Attachment:    Type:   Image     Comment:   External Document

## 2010-02-19 NOTE — Progress Notes (Signed)
Summary: Vicodin Refill  Phone Note Refill Request Call back at [facility] [phone] Message from:  Pharmacy on October 20, 2009 10:01 AM  Refills Requested: Medication #1:  VICODIN 5-500 MG  TABS 1 two times a day as needed   Last Refilled: 07/07/2009 Electronic refill request. Okay to refill?   Method Requested: Electronic Initial call taken by: Arnetha Courser CMA Deborra Medina),  October 20, 2009 10:01 AM  Follow-up for Phone Call        ok to refill. Follow-up by: Ria Bush  MD,  October 20, 2009 10:25 AM  Additional Follow-up for Phone Call Additional follow up Details #1::        Rx. called in as directed and patient notified Additional Follow-up by: Arnetha Courser CMA Deborra Medina),  October 20, 2009 10:41 AM    Prescriptions: VICODIN 5-500 MG  TABS (HYDROCODONE-ACETAMINOPHEN) 1 two times a day as needed  #60 x 1   Entered and Authorized by:   Ria Bush  MD   Signed by:   Ria Bush  MD on 10/20/2009   Method used:   Telephoned to ...       [facility] [address] [address] [phone]* (retail)       [address] [place], [place]  IN:[id number]       Ph: [phone]       Fax: [phone]   [place]:   [phone]

## 2010-02-20 ENCOUNTER — Encounter (INDEPENDENT_AMBULATORY_CARE_PROVIDER_SITE_OTHER): Payer: Self-pay | Admitting: *Deleted

## 2010-02-23 ENCOUNTER — Encounter: Payer: Self-pay | Admitting: Family Medicine

## 2010-02-25 NOTE — Miscellaneous (Signed)
Summary: Lab results to flowsheet  Clinical Lists Changes  Observations: Added new observation of CALCIUM: 9.4 mg/dL (02/16/2010 8:14) Added new observation of ALBUMIN: 4.2 g/dL (02/16/2010 8:14) Added new observation of PROTEIN, TOT: 6.3 g/dL (02/16/2010 8:14) Added new observation of SGPT (ALT): 15 units/L (02/16/2010 8:14) Added new observation of SGOT (AST): 18 units/L (02/16/2010 8:14) Added new observation of ALK PHOS: 61 units/L (02/16/2010 8:14) Added new observation of BILI TOTAL: 0.3 mg/dL (02/16/2010 8:14) Added new observation of CREATININE: 1.40 mg/dL (02/16/2010 8:14) Added new observation of BUN: 26 mg/dL (02/16/2010 8:14) Added new observation of BG RANDOM: 88 mg/dL (02/16/2010 8:14) Added new observation of CO2 PLSM/SER: 22 meq/L (02/16/2010 8:14) Added new observation of CL SERUM: 106 meq/L (02/16/2010 8:14) Added new observation of K SERUM: 5.1 meq/L (02/16/2010 8:14) Added new observation of NA: 139 meq/L (02/16/2010 8:14) Added new observation of LDL: 95 mg/dL (02/16/2010 8:14) Added new observation of HDL: 79 mg/dL (02/16/2010 8:14) Added new observation of TRIGLYC TOT: 56 mg/dL (02/16/2010 8:14) Added new observation of CHOLESTEROL: 185 mg/dL (02/16/2010 8:14)

## 2010-03-11 NOTE — Letter (Signed)
Summary: [facility] - Dr. Amalia Hailey, [facility] Office Visit   Imported By: Jamelle Haring 03/03/2010 09:50:34  _____________________________________________________________________  External Attachment:    Type:   Image     Comment:   External Document  Appended Document: [facility] - Dr. Amalia Hailey, [facility]    Clinical Lists Changes  Medications: Added new medication of TOVIAZ 8 MG XR24H-TAB (FESOTERODINE FUMARATE) one daily for urination Observations: Added new observation of PAST MED HX: Diverticulitis, hx of Hyperlipidemia Anxiety Low back pain Osteoporosis Depression GERD IBS glucose intolerance Allergic rhinitis migraine Hypertension- followed by Dr. Martinique Stress Urinary incontinence [Dr. Amalia Hailey  uro - Dr. Amalia Hailey  (03/03/2010 10:25)        Past History:  Past Medical History: Diverticulitis, hx of Hyperlipidemia Anxiety Low back pain Osteoporosis Depression GERD IBS glucose intolerance Allergic rhinitis migraine Hypertension- followed by Dr. Martinique Stress Urinary incontinence [Dr. Evans]  uro - Dr. Amalia Hailey

## 2010-03-26 NOTE — Letter (Signed)
Summary: [facility]   Imported By: Phillis Knack 03/16/2010 09:57:28  _____________________________________________________________________  External Attachment:    Type:   Image     Comment:   External Document

## 2010-03-27 ENCOUNTER — Other Ambulatory Visit: Payer: Self-pay | Admitting: Family Medicine

## 2010-03-27 DIAGNOSIS — Z1231 Encounter for screening mammogram for malignant neoplasm of breast: Secondary | ICD-10-CM

## 2010-04-07 ENCOUNTER — Encounter: Payer: Self-pay | Admitting: Family Medicine

## 2010-04-07 DIAGNOSIS — J309 Allergic rhinitis, unspecified: Secondary | ICD-10-CM | POA: Insufficient documentation

## 2010-04-07 DIAGNOSIS — F32A Depression, unspecified: Secondary | ICD-10-CM

## 2010-04-07 DIAGNOSIS — E785 Hyperlipidemia, unspecified: Secondary | ICD-10-CM

## 2010-04-07 DIAGNOSIS — M810 Age-related osteoporosis without current pathological fracture: Secondary | ICD-10-CM

## 2010-04-07 DIAGNOSIS — Z8719 Personal history of other diseases of the digestive system: Secondary | ICD-10-CM | POA: Insufficient documentation

## 2010-04-07 DIAGNOSIS — K589 Irritable bowel syndrome without diarrhea: Secondary | ICD-10-CM

## 2010-04-07 DIAGNOSIS — M545 Low back pain, unspecified: Secondary | ICD-10-CM

## 2010-04-07 DIAGNOSIS — G43909 Migraine, unspecified, not intractable, without status migrainosus: Secondary | ICD-10-CM | POA: Insufficient documentation

## 2010-04-07 DIAGNOSIS — N393 Stress incontinence (female) (male): Secondary | ICD-10-CM

## 2010-04-07 DIAGNOSIS — K219 Gastro-esophageal reflux disease without esophagitis: Secondary | ICD-10-CM

## 2010-04-07 DIAGNOSIS — F329 Major depressive disorder, single episode, unspecified: Secondary | ICD-10-CM | POA: Insufficient documentation

## 2010-04-07 DIAGNOSIS — R7302 Impaired glucose tolerance (oral): Secondary | ICD-10-CM

## 2010-04-07 DIAGNOSIS — F419 Anxiety disorder, unspecified: Secondary | ICD-10-CM

## 2010-04-07 DIAGNOSIS — I10 Essential (primary) hypertension: Secondary | ICD-10-CM

## 2010-04-12 ENCOUNTER — Other Ambulatory Visit: Payer: Self-pay | Admitting: Family Medicine

## 2010-04-13 ENCOUNTER — Ambulatory Visit (HOSPITAL_COMMUNITY)
Admission: RE | Admit: 2010-04-13 | Discharge: 2010-04-13 | Disposition: A | Discharge status: Home / Self Care | Payer: Medicare Other | Source: Ambulatory Visit | Attending: Family Medicine | Admitting: Family Medicine

## 2010-04-13 DIAGNOSIS — Z1231 Encounter for screening mammogram for malignant neoplasm of breast: Secondary | ICD-10-CM | POA: Insufficient documentation

## 2010-04-13 NOTE — Telephone Encounter (Signed)
Please phone in and notify patient.

## 2010-04-13 NOTE — Telephone Encounter (Signed)
Rx called in as directed and patient aware.

## 2010-04-15 ENCOUNTER — Encounter: Payer: Self-pay | Admitting: *Deleted

## 2010-04-19 HISTORY — PX: CT HEAD LIMITED W/O CM: HXRAD127

## 2010-04-30 ENCOUNTER — Other Ambulatory Visit: Payer: Self-pay | Admitting: *Deleted

## 2010-04-30 DIAGNOSIS — I10 Essential (primary) hypertension: Secondary | ICD-10-CM

## 2010-04-30 MED ORDER — ALISKIREN FUMARATE 150 MG PO TABS: 150.0000 mg | ORAL_TABLET | Freq: Every day | ORAL | Status: DC

## 2010-04-30 NOTE — Telephone Encounter (Signed)
escribe medication per fax request  

## 2010-05-06 ENCOUNTER — Telehealth: Payer: Self-pay | Admitting: *Deleted

## 2010-05-06 ENCOUNTER — Inpatient Hospital Stay: Payer: Self-pay | Admitting: Internal Medicine

## 2010-05-06 DIAGNOSIS — R079 Chest pain, unspecified: Secondary | ICD-10-CM

## 2010-05-06 NOTE — Telephone Encounter (Signed)
Noted.  Admitted to [facility], received H&P already.  Can we call tomorrow to see how she's doing?  I'll be glad to f/u with patient at d/c.

## 2010-05-06 NOTE — Telephone Encounter (Signed)
Triage Record Num: [id number] Operator: Regina Eck Patient Name: Donshae Monico Call Date & Time: 05/05/2010 11:04:35PM Patient Phone: [phone] PCP: Patient Gender: Female PCP Fax : Patient DOB: [birth year] Practice Name: [facility] Reason for Call: Pt. calling about numbness on left side of body; pressure in left arm above elbow and some in midsternum; dry throat Onset 4/17 approx. 45 mins. Emergent sx of 'New numbness, weakness or paralysis involving face, arm or leg, especially on same side of body, occuring now or within last 2 hrs' per Neurological Deficits protocol. RN instructed pt. to contact 911 now - she will go to [facility] as that is closest to her. Protocol(s) Used: Neurological Deficits Recommended Outcome per Protocol: Activate EMS 911 Reason for Outcome: New numbness, weakness or paralysis involving face, arm or leg, especially on same side of body, occurring now or within last 2 hours Care Advice: ~ Do not give the patient anything to eat or drink. ~ IMMEDIATE ACTION Write down provider's name. List or place the following in a bag for transport with the patient: current prescription and/or nonprescription medications; alternative treatments, therapies and medications; and street drugs. ~ 04/

## 2010-05-07 NOTE — Telephone Encounter (Signed)
Spoke with patient. She feels about the same as she did. The hospital  Said no bleed or blockages. She was dehydrated and is trying to get more fluids. Appointment scheduled.

## 2010-05-14 ENCOUNTER — Ambulatory Visit (INDEPENDENT_AMBULATORY_CARE_PROVIDER_SITE_OTHER): Payer: Medicare Other | Admitting: Family Medicine

## 2010-05-14 ENCOUNTER — Encounter: Payer: Self-pay | Admitting: Family Medicine

## 2010-05-14 ENCOUNTER — Telehealth: Payer: Self-pay | Admitting: *Deleted

## 2010-05-14 VITALS — BP 116/68 | HR 56 | Temp 98.4°F | Wt 123.1 lb

## 2010-05-14 DIAGNOSIS — I10 Essential (primary) hypertension: Secondary | ICD-10-CM

## 2010-05-14 DIAGNOSIS — R1013 Epigastric pain: Secondary | ICD-10-CM

## 2010-05-14 DIAGNOSIS — N259 Disorder resulting from impaired renal tubular function, unspecified: Secondary | ICD-10-CM

## 2010-05-14 LAB — POCT URINALYSIS DIPSTICK
Blood, UA: NEGATIVE
Nitrite, UA: NEGATIVE
Protein, UA: 100
Urobilinogen, UA: 0.2
pH, UA: 6.5

## 2010-05-14 NOTE — Patient Instructions (Addendum)
We will check blood work today for H pylori If normal, consider going up to 2 pills of prilosec daily for a 2 wk trial.   We will set you up with stomach doctor, we will call you with appointment.Marland Kitchenif H pylori test negative Ensure you are pushing fluids and rest. Good to see you today, call us with questions.

## 2010-05-14 NOTE — Assessment & Plan Note (Signed)
Main concern today is feeling of dry mouth, states this is how sxs started that led her to ER. Sxs consistent with dyspepsia, concern for gastritis.  Has had sxs on and off for > 6 mo.  Cardiac w/u negative in hospital. Has not been checked for H pylori.  If positive, treat.  If negative, referral to GI for eval, possible EGD (given age, in setting of longstanding difficulty swallowing small pills, no problems with solids or liquids though, r/o stricture, other).  No current red flags. For now, continue prilosec 20mg  daily.  Pt hesitant to increase as states very sensitive to meds.

## 2010-05-14 NOTE — Assessment & Plan Note (Addendum)
Stable per previous checks.    Lab Results  Component Value Date   CREATININE 1.40 02/16/2010   BUN 26 02/16/2010   NA 139 02/16/2010   K 5.1 02/16/2010   CL 106 02/16/2010   CO2 22 02/16/2010  (04/2010 - 1.43 in hospital)  On tekturna.  Per chart intolerance of ACEIs (bumped kidney function). Reviewing records, pt has had renal doppler in past. Will check with Dr. Martinique to see his opinion on ARB as unsure if tekturna will have same proven renoprotection as ACEI/ARBs.  However BP has been very well controlled on current therapy. 2+ proteinuria today.  Recheck next visit.  Due for Hgb recheck as well. Not hypercalcemic.

## 2010-05-14 NOTE — Telephone Encounter (Signed)
Error-no telephone note for this patient

## 2010-05-14 NOTE — Progress Notes (Signed)
Subjective:    Patient ID: [name], female    DOB: January 11, 1933, 77 y.o.   MRN: [id number]  HPI CC: [facility] hospitalization f/u  Nicole English is pleasant 77 yo with h/o HTN, GERD, DDD who presents today as hospital follow up for recent 24 hour admission 4/18 for chest pain likely 2/2 gastritis as well as L arm numbness and tightness.  Started on prilosec 20mg  po bid.  Underwent CT of head which was normal.  Nitroglycerin didn't help.  [place] H&P and D/C summary reviewed.  She underwent r/o with negative enzymes x 3, and lexiscan showing no significant ischemia, normal wall motion and EF 90%.  She was also found to have creatinine of 1.43.  At baseline does have some CRI with Cr ranging from 1.5 to 1.9 in last 2 years.  Does not see nephrologist currently.    Today Nicole English states she has had some dehydration symptoms.  Also under a lot of increased stress recently.  Since discharge, felt good initially, yesterday started having dry mouth again as well as burping.  This is how symptoms started that led her to ER.  Drinking plenty of water.  Trying to stay hydrated but having difficulty.  Voiding more than normal.  Now having some irritative symptoms.  Some shortness of breath last few days along with indigestion.  Discomfort not associated with any type of food.  Taking prilosec 20mg  twice daily.  + burning bladder, no rashes or discharge or yeast infection.  H/o stress incontinence, prescribed toviaz by Dr. Amalia Hailey at [facility] (uro) but has stopped taking.  Decreased appetite.  No early satiety.  Endorses longstanding h/o dysphagia with small pills, not otherwise.  States had normal swallow study but was disenfranchised with them because doesn't felt they actually evaluated her complaint.    No fevers/chills, NS, weight changes, recurrent vomiting.  No nausea.  No HA, vision changes, leg swelling.  Medications and allergies reviewed and updated as above. PMHx, SHx reviewed for relevance. Past  Medical History  Diagnosis Date  . History of diverticulitis of colon   . HLD (hyperlipidemia)   . Anxiety   . LBP (low back pain)   . OP (osteoporosis)   . Depression   . GERD (gastroesophageal reflux disease)   . IBS (irritable bowel syndrome)   . Glucose intolerance (impaired glucose tolerance)   . Allergic rhinitis   . Migraine   . HTN (hypertension)     Dr. Martinique, cards  . SUI (stress urinary incontinence, female)     Dr. Amalia Hailey, urology (some urge as well)   Past Surgical History  Procedure Date  . Tonsillectomy 1942  . Submucous resection 1962  . Tubal ligation 1973  . Breast enhancement surgery 1982  . Total abdominal hysterectomy 1996    for frequent dysmennorhea  . Macroplastique implantation 03/2008    Dr. Rogers Blocker  . Uretherolysis and removal of macroplastique 09/13/08    Dr. Amalia Hailey  . Ct head limited w/o cm 04/2010    nothing acute, ? old infarcts L internal capsule  . Renal doppler 2009?    simple cysts, wnl    reports that she has quit smoking. She does not have any smokeless tobacco history on file. She reports that she drinks alcohol. She reports that she does not use illicit drugs. family history includes Cancer in her father; Heart disease in her father; Heart failure in her father; and Leukemia in her mother. Allergies  Allergen Reactions  . Doxycycline  Monohydrate     REACTION: Vomitting  . Ramipril     REACTION: worsening renal function   Review of Systems Per HPI    Objective:   Physical Exam  Constitutional: She appears well-developed and well-nourished. No distress.  HENT:  Head: Normocephalic and atraumatic.  Mouth/Throat: Oropharynx is clear and moist. No oropharyngeal exudate.  Eyes: Conjunctivae and EOM are normal. Pupils are equal, round, and reactive to light. No scleral icterus.  Neck: Normal range of motion. Neck supple.  Cardiovascular: Normal rate, regular rhythm, normal heart sounds and intact distal pulses.   No murmur  heard. Pulmonary/Chest: Effort normal and breath sounds normal. No respiratory distress. She has no wheezes. She has no rales.  Abdominal: Soft. Normal appearance and bowel sounds are normal. She exhibits no distension and no mass. There is no hepatosplenomegaly. There is no tenderness. There is no rebound and no guarding.  Lymphadenopathy:    She has no cervical adenopathy.  Skin: Skin is warm and dry. No rash noted.          Assessment & Plan:

## 2010-05-14 NOTE — Assessment & Plan Note (Addendum)
Good control on current meds, see below

## 2010-05-15 ENCOUNTER — Telehealth: Payer: Self-pay | Admitting: Family Medicine

## 2010-05-15 ENCOUNTER — Encounter: Payer: Self-pay | Admitting: Family Medicine

## 2010-05-15 DIAGNOSIS — R1013 Epigastric pain: Secondary | ICD-10-CM

## 2010-05-15 DIAGNOSIS — K219 Gastro-esophageal reflux disease without esophagitis: Secondary | ICD-10-CM

## 2010-05-15 NOTE — Telephone Encounter (Signed)
Patient notified. She said she was feeling slightly better. She increased her prilosec to BID yesterday and it seemed to help. She is going to do that over the weekend and call me Monday with an update before going to GI. She also said she saw Dr. Martinique in January and got a good report from him.

## 2010-05-15 NOTE — Telephone Encounter (Signed)
Please notify H pylori test returned negative for infection.  If still having abd sxs, will refer to GI for evaluation.  Have placed order in chart.  Also ask when last time she saw Dr. Martinique Cards was - if not in last 6 mo, rec f/u with him for BP check (as he wanted her to f/u in 6 mo at last visit (07/2009)

## 2010-05-18 ENCOUNTER — Ambulatory Visit: Payer: Medicare Other | Admitting: Family Medicine

## 2010-05-18 ENCOUNTER — Telehealth: Payer: Self-pay | Admitting: *Deleted

## 2010-05-18 NOTE — Telephone Encounter (Signed)
Pt called back today to report.  She says she does feel better since increasing her prilosec to twice a day dosing.  She doesn't know if she should have further testing- she has concerns and would like direction from you.  Please call pt when you can to discuss her concerns.

## 2010-05-18 NOTE — Telephone Encounter (Signed)
Discussed concerns.  Reviewed CRI.  Discussed GERD and gastritis. Pt prefers to wait on GI referral, as no early satiety or fevers or weight loss, ok to wait. Will call us if changes mind. Will take prilosec 20mg  bid x 3 wks then notify us if needs daily medicine (would likely send in 40mg  pill daily).

## 2010-05-20 ENCOUNTER — Encounter: Payer: Self-pay | Admitting: Family Medicine

## 2010-05-20 ENCOUNTER — Other Ambulatory Visit: Payer: Self-pay | Admitting: Family Medicine

## 2010-05-21 NOTE — Telephone Encounter (Signed)
Ok to phone in.  plz call pt.

## 2010-05-21 NOTE — Telephone Encounter (Signed)
Rx called in as directed.   

## 2010-06-02 ENCOUNTER — Ambulatory Visit (AMBULATORY_SURGERY_CENTER): Payer: Medicare Other

## 2010-06-02 VITALS — Ht 63.5 in | Wt 124.7 lb

## 2010-06-02 DIAGNOSIS — Z1211 Encounter for screening for malignant neoplasm of colon: Secondary | ICD-10-CM

## 2010-06-02 NOTE — Progress Notes (Signed)
Pt came into office today for her pre-visit prior to the colonoscopy.When I verified her appointment for the procedure, (colon) on 06/17/10 with Dr Deatra Ina, she got mad and said she was coming into the office for an endoscopy not a colonoscopy. I explained to the pt we would need an order from her doctor for an endoscopy or she can see Dr Deatra Ina in the office to discuss. Pt said she was leaving and going somewhere else and left the office!Nicole English

## 2010-06-02 NOTE — Procedures (Signed)
RENAL ARTERY DUPLEX EVALUATION   INDICATION:  Elevated, fluctuating hypertension.   HISTORY:  Diabetes:  No.  Cardiac:  Possible CHF.  Hypertension:  Yes.  Smoking:  Sporadic.   RENAL ARTERY DUPLEX FINDINGS:  Aorta-Proximal:  57 cm/s  Aorta-Mid:  55 cm/s  Aorta-Distal:  74 cm/s  Celiac Artery Origin:  176 cm/s  SMA Origin:  153 cm/s                                    RIGHT               LEFT  Renal Artery Origin:             98/10 cm/s          57/10 cm/s  Renal Artery Proximal:           123/26 cm/s         52/14 cm/s  Renal Artery Mid:                101/14 cm/s         27/7 cm/s  Renal Artery Distal:             112/16 cm/s         38/6 cm/s  Hilar Acceleration Time (AT):  Renal-Aortic Ratio (RAR):        2.16                1.0  Kidney Size:                     10.4 cm             [id number] cm  End Diastolic Ratio (EDR):  Resistive Index (RI):            0.78   IMPRESSION:  1. Normal renal artery duplex with no evidence of stenosis      bilaterally.  2. Bilateral kidneys appear normal with respect to shape and size.  3. However, bilateral kidneys appear multicystic with the largest on      the right measuring 2.03 cm X 2.52 cm and on the left measuring      1.49 cm X 1.70 cm.  4. Note:  Some areas technically difficult to image.   ___________________________________________  Rosetta Posner, M.D.   AS/MEDQ  D:  09/06/2007  T:  09/06/2007  Job:  [id number]

## 2010-06-03 ENCOUNTER — Telehealth: Payer: Self-pay | Admitting: *Deleted

## 2010-06-03 DIAGNOSIS — R1013 Epigastric pain: Secondary | ICD-10-CM

## 2010-06-03 DIAGNOSIS — K219 Gastro-esophageal reflux disease without esophagitis: Secondary | ICD-10-CM

## 2010-06-03 DIAGNOSIS — Z8719 Personal history of other diseases of the digestive system: Secondary | ICD-10-CM

## 2010-06-03 DIAGNOSIS — K589 Irritable bowel syndrome without diarrhea: Secondary | ICD-10-CM

## 2010-06-03 NOTE — Telephone Encounter (Signed)
Patient went to her GI appt and was very upset that she didn't have a endoscopy. She says that they want her to have a colonoscopy and she refuses to have one. She says that no one there wanted to help her so she is going somewhere else.

## 2010-06-04 ENCOUNTER — Encounter: Payer: Self-pay | Admitting: *Deleted

## 2010-06-04 NOTE — Telephone Encounter (Signed)
Called to touch base with pt.  confusion because when arrived at GI, realized was there for a screening colonsocopy??  I'm confused as well because that's not what I ordered and I thought I had cancelled that referral.  I wanted her to be evaluated by GI doc for possible EGD.  Pt states she does not want to have another screening colonoscopy. Does not want to go to [place].  Requests we set up referral to [facility] clinic for GI evaluation of upper GI sxs. Will place in chart.

## 2010-06-08 NOTE — Telephone Encounter (Signed)
Noted. Thanks.

## 2010-06-08 NOTE — Telephone Encounter (Signed)
I Dont know what the situation was with [facility]. She had a Endoscopy and Colonoscopy in 2002 with [facility]. The appt was made with the patient. I think it was [facility] that scheduled it wrong but the patient was prob due to have both done again. I have made her a consult for an Endoscopy with [facility] for 07/02/2010 this is s pre-visit before they do an Endoscopy on her. Now I also need to request again the [facility] records as they are gone from here for scanning. I will await those records as well as her old [place] ones to send to [facility].

## 2010-06-17 ENCOUNTER — Other Ambulatory Visit: Payer: Medicare Other | Admitting: Gastroenterology

## 2010-07-06 ENCOUNTER — Other Ambulatory Visit: Payer: Self-pay | Admitting: Family Medicine

## 2010-07-07 NOTE — Telephone Encounter (Signed)
plz phone in. 

## 2010-07-07 NOTE — Telephone Encounter (Signed)
Rx called in as directed.   

## 2010-07-08 ENCOUNTER — Emergency Department: Payer: Self-pay | Admitting: Unknown Physician Specialty

## 2010-07-08 ENCOUNTER — Telehealth: Payer: Self-pay | Admitting: *Deleted

## 2010-07-08 NOTE — Telephone Encounter (Signed)
Spoke with patient again. She said she hasn't been taking the prilosec twice a day. She has only been taking it when she really needs it because it causes swelling. She said that she can check her BP at home but hasn't been doing it. I encouraged her to start taking it regularly since she has the means to do so. I also encouraged her to increase her fluid intake and if that didn't help to let us know. She said she saw a PA at [facility] and assumed they had your note. She said she was feeling worse and really thought she was dehydrated. I again encouraged her to increase her fluids and she said she had, but that it wasn't helping. I explained that it may take a while to notice any improvement. She said her mouth was terribly dry and her head felt "bad." She checked her blood pressure on the phone with me and it was 178/114. I explained that more than likely that is why she's feeling bad. She said she has been taking her medication as directed and wanted to know how to get it lower so that she would feel better. I told her that would be for a doctor to decide, that there wasn't anything I could tell her to do over the phone to lower it. I told her that you were gone this afternoon and that it may be in her best interest to go to an urgent care for immediate treatment and then we can address it further here later this week. She agreed and said she would go. GI is aware that she refuses the colonoscopy.

## 2010-07-08 NOTE — Telephone Encounter (Signed)
Patient called back and left a message that she was able to cancel the colonoscopy portion of the test. She says that she is starting to feel dehydrated again with extremely dry mouth and same symptoms as before and wants to know if she should go back to the ER for fluids and tests. Please advise

## 2010-07-08 NOTE — Telephone Encounter (Signed)
Patient called and was upset that [facility] wanted to do a colonoscopy too. She wanted to know why everyone wanted to do that when all she needed was an endoscopy. I tried explaining to her that her last colonoscopy was done in 2002 and that the standards recommend to repeat every 10 years. I said it has been 10 years, so she is due for that study. I told her it was easier on her to have both done at the same time instead of separately so that's probably why they scheduled her for both. She said she only wanted the endoscopy done due to issues with her previous colonoscopy. I asked if she had told [facility] that when she was there and she said she was never given the opportunity to. I told her she needed to call [facility] and advise them that she only wanted the one study since it was her right as a patient to refuse and so that they could make appropriate changes to their schedule. I provided her with the phone number and told her to make sure she called them today. She said she would.  I also told her to consider the colonoscopy since she was due for it and that it would be easier on her. She still refused it.

## 2010-07-08 NOTE — Telephone Encounter (Signed)
Has she been taking prilosec 20mg  twice daily? Is she able to check blood pressure at home to ensure staying good value? I would recommend drink as much simple fluid as possible (water, gatorade), if not improving to let us know. Did she have initial evaluation with GI doc prior to scheduling procedure?  Had they received my note? Pt has declined colonsocopy in past which is her right to do, but needs to make GI docs aware that is her decision on this.  It was in my last phone note.

## 2010-07-09 NOTE — Telephone Encounter (Signed)
Noted. Thanks.

## 2010-07-09 NOTE — Telephone Encounter (Signed)
Patient called me before I could call her. She said she was told to follow up with you since her BP was high. I made her an appt for tomorrow to discuss things. She said she is still convinced she is dehydrated because of her dry mouth and gas but said nothing was done for that yesterday. I told her many things can cause dry mouth and gas, not just being dehydrated, so it would be discussed with her tomorrow. She verbalized understanding. I will get records.

## 2010-07-09 NOTE — Telephone Encounter (Signed)
Can we call for an update on [facility] visit?

## 2010-07-10 ENCOUNTER — Encounter: Payer: Self-pay | Admitting: Family Medicine

## 2010-07-10 ENCOUNTER — Ambulatory Visit (INDEPENDENT_AMBULATORY_CARE_PROVIDER_SITE_OTHER): Payer: Medicare Other | Admitting: Family Medicine

## 2010-07-10 DIAGNOSIS — K117 Disturbances of salivary secretion: Secondary | ICD-10-CM

## 2010-07-10 DIAGNOSIS — I10 Essential (primary) hypertension: Secondary | ICD-10-CM

## 2010-07-10 DIAGNOSIS — R1310 Dysphagia, unspecified: Secondary | ICD-10-CM | POA: Insufficient documentation

## 2010-07-10 DIAGNOSIS — R682 Dry mouth, unspecified: Secondary | ICD-10-CM

## 2010-07-10 MED ORDER — GABAPENTIN 100 MG PO CAPS: 100.0000 mg | ORAL_CAPSULE | Freq: Three times a day (TID) | ORAL | Status: DC

## 2010-07-10 NOTE — Patient Instructions (Signed)
Decrease gabapentin (neurontin) to 100mg  nightly, hopefully this will help dry mouth.  I've sent in 1 month supply. Ensure you are carrying water bottle with you to stay well hydrated. Keep eye on blood pressures in mornings for now and call me with readings in a week.  If staying elevated, we may increase tekturna.  If we do we would want you to return sooner for blood work. Good to see you today, call us with questions.

## 2010-07-10 NOTE — Assessment & Plan Note (Signed)
Don't think due to dehydration. Reviewed meds,gabapentin and vicodin could cause dry mouth. Per patient tekturna as well, but not on list of side effects available to me. Start with decreasing gabapentin to 100mg  daily, reassess. RTC 3 mo, sooner if needed.

## 2010-07-10 NOTE — Progress Notes (Signed)
Subjective:    Patient ID: Nicole English, female    DOB: 1932-04-10, 77 y.o.   MRN: [id number]  HPI CC: hosp f/u bp and dry mouth  Seen 07/08/2010 [facility] (records reviewed) for elevated sbp to 200s, they did not give IV fluids, just monitored.  EKG - NSR bradycardia at 56.  Blood work remarkable for Cr 1.5, known CRI.  Hgb 11.1, TnI <0.02. Lab Results  Component Value Date   CREATININE 1.40 02/16/2010   Recent labile BP - in ER elevated to [address].  Brings log of home values over last 2 days - 122-161/66-94, HR 50s consistently.  On bystolic.  Taking prilosec 20mg  every other day.  When takes daily, notices some neck swelling.  Saw [facility] PA, changed prilosec to something stronger (unsure med) but unable to fill 2/2 cost.  Scheduled for EGD with [facility] next week.  Concern with esoph stricture.  + dizziness and eye pain, head pressure yesterday.  Thought had fever and some chest tightness.  Diffuse body aches/pain.  ? Viral syndrome.  No cough, SOB, chest pain.  Today feeling better.  Dry mouth - has been going on for several months.  Has been on gabapentin for several years.  Endorses dry eyes when feels dry mouth.  No taut or dry skin.  Patient Active Problem List  Diagnoses  . VITAMIN D DEFICIENCY  . ANEMIA, NORMOCYTIC  . CKD (chronic kidney disease) stage 3, GFR 30-59 ml/min  . LOW BACK PAIN  . OSTEOPOROSIS  . INSOMNIA-SLEEP DISORDER-UNSPEC  . DIVERTICULITIS, HX OF  . History of diverticulitis of colon  . HLD (hyperlipidemia)  . Anxiety  . Depression  . GERD (gastroesophageal reflux disease)  . IBS (irritable bowel syndrome)  . Glucose intolerance (impaired glucose tolerance)  . Allergic rhinitis  . Migraine  . HTN (hypertension)  . SUI (stress urinary incontinence, female)  . Abdominal pain, epigastric   Past Medical History  Diagnosis Date  . History of diverticulitis of colon   . HLD (hyperlipidemia)   . Anxiety   . LBP (low back pain)   . OP (osteoporosis)   .  Depression   . GERD (gastroesophageal reflux disease)   . IBS (irritable bowel syndrome)   . Glucose intolerance (impaired glucose tolerance)   . Allergic rhinitis   . Migraine   . HTN (hypertension)     Dr. Martinique, cards  . SUI (stress urinary incontinence, female)     Dr. Amalia Hailey, urology (some urge as well)   Past Surgical History  Procedure Date  . Tonsillectomy 1942  . Submucous resection 1962  . Tubal ligation 1973  . Breast enhancement surgery 1982  . Total abdominal hysterectomy 1996    for frequent dysmennorhea  . Macroplastique implantation 03/2008    Dr. Rogers Blocker  . Uretherolysis and removal of macroplastique 09/13/08    Dr. Amalia Hailey  . Ct head limited w/o cm 04/2010    nothing acute, ? old infarcts L internal capsule  . Renal doppler 2009?    simple cysts, wnl  . Colonoscopy     does not want another!   History  Substance Use Topics  . Smoking status: Former Research scientist (life sciences)  . Smokeless tobacco: Not on file  . Alcohol Use: Yes     1 1/2-2 glasses white wine/day   Family History  Problem Relation Age of Onset  . Heart failure Father   . Heart disease Father   . Cancer Father     prostate  .  Leukemia Mother     CLL prior   Allergies  Allergen Reactions  . Doxycycline Monohydrate     REACTION: Vomitting  . Ramipril     REACTION: worsening renal function   Current Outpatient Prescriptions on File Prior to Visit  Medication Sig Dispense Refill  . aliskiren (TEKTURNA) 150 MG tablet Take 1 tablet (150 mg total) by mouth daily.  30 tablet  5  . ALPRAZolam (XANAX) 0.25 MG tablet take 1 tablet by mouth twice a day if needed  60 tablet  0  . cholecalciferol (VITAMIN D) 1000 UNITS tablet Take 1,000 Units by mouth daily.        . Coenzyme Q10 (COQ10) 50 MG CAPS Take 1 capsule by mouth daily.        Marland Kitchen gabapentin (NEURONTIN) 300 MG capsule Take 300 mg by mouth at bedtime.        Marland Kitchen HYDROcodone-acetaminophen (VICODIN) 5-500 MG per tablet take 1 tablet by mouth twice a day if needed   60 tablet  1  . nebivolol (BYSTOLIC) 5 MG tablet Take 2.5 mg by mouth daily.        Marland Kitchen omeprazole (PRILOSEC) 20 MG capsule Take 20 mg by mouth as needed.       . zolpidem (AMBIEN) 10 MG tablet take 1 tablet by mouth at bedtime if needed for insomnia  30 tablet  1   Review of Systems Per HPI    Objective:   Physical Exam  Nursing note and vitals reviewed. Constitutional: She appears well-developed and well-nourished. No distress.  HENT:  Head: Normocephalic and atraumatic.  Mouth/Throat: Oropharynx is clear and moist. No oropharyngeal exudate.  Eyes: Conjunctivae and EOM are normal. Pupils are equal, round, and reactive to light. No scleral icterus.  Neck: Normal range of motion. Neck supple. Carotid bruit is not present.  Cardiovascular: Normal rate, regular rhythm, normal heart sounds and intact distal pulses.   No murmur heard. Pulmonary/Chest: Effort normal and breath sounds normal. No respiratory distress. She has no wheezes. She has no rales.  Abdominal: Soft. Bowel sounds are normal. She exhibits no distension. There is no tenderness. There is no rebound.       No abd/renal bruits  Musculoskeletal: She exhibits no edema.  Lymphadenopathy:    She has no cervical adenopathy.  Skin: Skin is warm and dry. No rash noted.  Psychiatric: She has a normal mood and affect.          Assessment & Plan:

## 2010-07-10 NOTE — Assessment & Plan Note (Signed)
Scheduled for endoscopy next week. Concern for stricture vs other. If normal, consider testing for scleroderma given sxs.

## 2010-07-10 NOTE — Assessment & Plan Note (Addendum)
Stable from hospital check where Cr 1.5. Lab Results  Component Value Date   CREATININE 1.40 02/16/2010

## 2010-07-10 NOTE — Assessment & Plan Note (Addendum)
Bradycardic but not sxs.  On bystolic. No changes for now.  Check bp's with heart rate in am for next week, call us with update. Will decide on changes then.

## 2010-07-13 ENCOUNTER — Telehealth: Payer: Self-pay | Admitting: *Deleted

## 2010-07-13 NOTE — Telephone Encounter (Signed)
Continued from below-   Endoscopy and was told to not drink anything for up 5 hrs before coming in and she asking what she can do to help with her thirst since she has to go so long without any liquid. Please advise.

## 2010-07-13 NOTE — Telephone Encounter (Signed)
No good answer. Push fluids until she is supposed to stop drinking.  Hopefully endoscopy will help alleviate sxs.

## 2010-07-13 NOTE — Telephone Encounter (Signed)
Patient notified

## 2010-07-13 NOTE — Telephone Encounter (Signed)
Patient says that she is staying extremely thirsty. She has decreased the gabapentin as instructed but hasn't seemed to help. Her main concern is that she is going in for her

## 2010-07-14 ENCOUNTER — Encounter: Payer: Self-pay | Admitting: Cardiology

## 2010-07-15 ENCOUNTER — Ambulatory Visit: Payer: Medicare Other | Admitting: Gastroenterology

## 2010-07-15 ENCOUNTER — Encounter: Payer: Self-pay | Admitting: Family Medicine

## 2010-07-16 ENCOUNTER — Ambulatory Visit: Payer: Self-pay | Admitting: Gastroenterology

## 2010-07-17 ENCOUNTER — Telehealth: Payer: Self-pay | Admitting: *Deleted

## 2010-07-17 NOTE — Telephone Encounter (Signed)
Pt called with her BP readings.  6/23 136/80 pulse 53, 6/24 124/69 p 57, 6/25 139/77 p 56, 6/26 112/63 p 62, 6/27 140/83 p 57, 6/28 144/89 p 54, 6/29 146/76 p 50.

## 2010-07-19 NOTE — Telephone Encounter (Signed)
Noted. Blood pressure running slightly high.  Goal for her <130/80 given some kidney insufficiency. Lab Results  Component Value Date   CREATININE 1.40 02/16/2010  pulse also running low.  May consider going off bystolic and starting amlodipine if pt amenable.  Or to f/u with Dr. Martinique and bring log like she has been keeping.  How did endoscopy go?

## 2010-07-20 ENCOUNTER — Encounter: Payer: Self-pay | Admitting: Family Medicine

## 2010-07-20 NOTE — Telephone Encounter (Signed)
Attempted to contact patient. Phone # has been disconnected. Will try again later.

## 2010-07-21 NOTE — Telephone Encounter (Signed)
Attempted to call patient. No answer and mailbox could not accept messages at this time. Will try again later.

## 2010-07-23 ENCOUNTER — Encounter: Payer: Self-pay | Admitting: Family Medicine

## 2010-07-23 NOTE — Telephone Encounter (Signed)
Attempted to contact patient. No answer and mailbox could not accept messages. Will try again later.

## 2010-07-24 ENCOUNTER — Encounter: Payer: Self-pay | Admitting: Family Medicine

## 2010-07-24 NOTE — Telephone Encounter (Signed)
Attempted to contact patient. No answer and mailbox could not accept messages. Will try again later.

## 2010-07-25 ENCOUNTER — Other Ambulatory Visit: Payer: Self-pay | Admitting: Family Medicine

## 2010-07-26 NOTE — Telephone Encounter (Signed)
Please phone in and notify patient.

## 2010-07-27 NOTE — Telephone Encounter (Signed)
Rx called in as directed.   

## 2010-07-28 NOTE — Telephone Encounter (Signed)
Attempted to contact patient. No answer and mailbox could not accept messages. Will try again later.

## 2010-07-29 ENCOUNTER — Telehealth: Payer: Self-pay | Admitting: Cardiology

## 2010-07-29 NOTE — Telephone Encounter (Signed)
Called stating BP has been elevated. States she was in [place] in April to r/o stroke and heart attack. Had stress test and was told was neg. MRI was neg. BP has been elevated off and on since then. Yest 155/87; 159/96; last pm 143/87; today 141/87; feels lightheaded and has headache. Has been under some stress with moving. Advised that Dr. Martinique was at [place] and would talk w/ him and get back in touch w/ her. Is taking Bystolic 2.5 mg daily and Tekturna 150 mg daily. Has been on Bystolic 5 mg BID in past and couldn't tolerate. She probably could tolerate Bystolic 5 mg daily.

## 2010-07-29 NOTE — Telephone Encounter (Signed)
Pt called back.  She is going to check with Dr. Martinique and see what he advises.  She will call back here to have meds changed if she needs to.   Her endoscopy went well, they didn't find anything wrong but she still has the problem that she went in with, they did do some stretching while they were in there so she hopes that will help.  (we were calling her new home number, but she doesn't live there yet, so please call her on her mobile number, which is actually her home phone number at her present house)

## 2010-07-29 NOTE — Telephone Encounter (Signed)
CALL PT ABOUT A SPIKE IN BP. CHART IN BOX.

## 2010-07-30 NOTE — Telephone Encounter (Signed)
Pt called to let you know that Dr. Martinique has decreased her bystolic dose to 5 mg's a day to see if that will better control her BP.

## 2010-07-30 NOTE — Telephone Encounter (Signed)
Increase bystolic to 5 mg daily.

## 2010-07-30 NOTE — Telephone Encounter (Signed)
Noted. Thanks.

## 2010-07-30 NOTE — Telephone Encounter (Signed)
Per Dr. Martinique from note yest will increase Bystolic to 5 mg. Continue to monitor BP. Will call if has any further elevation of BP.

## 2010-08-16 ENCOUNTER — Emergency Department (HOSPITAL_COMMUNITY)
Admission: EM | Admit: 2010-08-16 | Discharge: 2010-08-17 | Disposition: A | Discharge status: Home / Self Care | Payer: Medicare Other | Attending: Emergency Medicine | Admitting: Emergency Medicine

## 2010-08-16 DIAGNOSIS — R0609 Other forms of dyspnea: Secondary | ICD-10-CM | POA: Insufficient documentation

## 2010-08-16 DIAGNOSIS — I10 Essential (primary) hypertension: Secondary | ICD-10-CM | POA: Insufficient documentation

## 2010-08-16 DIAGNOSIS — Z7982 Long term (current) use of aspirin: Secondary | ICD-10-CM | POA: Insufficient documentation

## 2010-08-16 DIAGNOSIS — R0989 Other specified symptoms and signs involving the circulatory and respiratory systems: Secondary | ICD-10-CM | POA: Insufficient documentation

## 2010-08-16 DIAGNOSIS — R42 Dizziness and giddiness: Secondary | ICD-10-CM | POA: Insufficient documentation

## 2010-08-16 DIAGNOSIS — Z79899 Other long term (current) drug therapy: Secondary | ICD-10-CM | POA: Insufficient documentation

## 2010-08-16 DIAGNOSIS — R079 Chest pain, unspecified: Secondary | ICD-10-CM | POA: Insufficient documentation

## 2010-08-17 ENCOUNTER — Emergency Department (HOSPITAL_COMMUNITY): Payer: Medicare Other

## 2010-08-17 ENCOUNTER — Other Ambulatory Visit: Payer: Self-pay | Admitting: Family Medicine

## 2010-08-17 ENCOUNTER — Telehealth: Payer: Self-pay | Admitting: Cardiology

## 2010-08-17 LAB — DIFFERENTIAL
Basophils Absolute: 0.1 10*3/uL (ref 0.0–0.1)
Basophils Relative: 1 % (ref 0–1)
Eosinophils Absolute: 0.3 10*3/uL (ref 0.0–0.7)
Eosinophils Relative: 4 % (ref 0–5)
Lymphs Abs: 1.5 10*3/uL (ref 0.7–4.0)
Neutrophils Relative %: 66 % (ref 43–77)

## 2010-08-17 LAB — CK TOTAL AND CKMB (NOT AT ARMC)
CK, MB: 4.8 ng/mL — ABNORMAL HIGH (ref 0.3–4.0)
Relative Index: INVALID (ref 0.0–2.5)

## 2010-08-17 LAB — CBC
MCV: 89.4 fL (ref 78.0–100.0)
Platelets: 322 10*3/uL (ref 150–400)
RBC: 3.48 MIL/uL — ABNORMAL LOW (ref 3.87–5.11)
RDW: 12.2 % (ref 11.5–15.5)
WBC: 7.9 10*3/uL (ref 4.0–10.5)

## 2010-08-17 LAB — POCT I-STAT, CHEM 8
Chloride: 99 mEq/L (ref 96–112)
HCT: 34 % — ABNORMAL LOW (ref 36.0–46.0)
Potassium: 4.8 mEq/L (ref 3.5–5.1)

## 2010-08-17 LAB — GLUCOSE, CAPILLARY: Glucose-Capillary: 114 mg/dL — ABNORMAL HIGH (ref 70–99)

## 2010-08-17 MED ORDER — GABAPENTIN 100 MG PO CAPS: 100.0000 mg | ORAL_CAPSULE | Freq: Three times a day (TID) | ORAL | Status: DC

## 2010-08-17 NOTE — Telephone Encounter (Signed)
Resent Rx- wrong quantity on initial refill. Changed from 30 to 90.

## 2010-08-17 NOTE — Telephone Encounter (Signed)
Pt seen at ER last night for high blood pressure, discharged this morning at 5 am, pt was prescribed Norvasc of  5mg , pt wants a call back regarding medication management, pt has 6 month ov f/u scheduled already for 8/9, please call pt back regarding questions

## 2010-08-17 NOTE — Telephone Encounter (Signed)
Was in ER early this AM w/ BP 195/85; the ER doctor gave RX for Norvasc but hasn't filled yet. Since being home BP 147/84. Looked back in chart and was unable to take Norvasc due to edema in ankles and feet. Since 7/11 has been taking Bystolic 5 mg. Per Dr. Martinique advised to increase Tekturna to 300 mg daily. Scheduled to see him 8/9. Advised to monitor BP and to call if remains elevated. She wants to take Bystolic 2.5 mg BID; and Tekturna 150 mg BID. Will call if further questions.

## 2010-08-21 ENCOUNTER — Encounter: Payer: Self-pay | Admitting: Cardiology

## 2010-08-21 ENCOUNTER — Telehealth: Payer: Self-pay | Admitting: Cardiology

## 2010-08-21 NOTE — Telephone Encounter (Signed)
Pt states her blood pressure is up and was seen in the ER this weekend, would like to speak to nurse by end of day, told pt will route message but it is 55mins til 5pm, if no call returned by end of day to please return to emergency room if she is having any symptoms, please call pt back ASAP

## 2010-08-21 NOTE — Telephone Encounter (Signed)
Called at 4:55 stating BP today about 5 pm was 212/110.  Took a Xanax. At 5:30 was 174/94. She is taking both Bystolic 5 mg and Tekturna 300 mg daily in AM. Advised if BP high tonight ie: 200/100 can take extra Bystolic 2.5 mg. Also advised maybe tomorrow take Bystolic in AM and Tekturna in PM. She may take a Xanax as needed. Will call her Monday to see how she did over weekend and will talk w/Dr. Martinique to see if wants to adjust medication again. She was comfortable with that. If gets concerned over weekend will go to ER.

## 2010-08-24 ENCOUNTER — Telehealth: Payer: Self-pay | Admitting: Cardiology

## 2010-08-24 NOTE — Telephone Encounter (Signed)
Called stating she has 2 doses of Tekturna left and didn't want to get refilled if he was going to change med. Has an app 8/9. BP yest (sun) was 165/105. Is taking Bystolic 5 mg in AM; and Tekturna 300 mg around 2-3 PM and then taking extra Bystolic 2.5 mg around 7 pm. Per Dr. Martinique take the tekturna tues and wed and Bystolic and will re adjust meds on Thursday.

## 2010-08-24 NOTE — Telephone Encounter (Signed)
Patient called, she will be two days short on Tekturna.  She wasn't sure if she needs to refill, patient did not want to refill full prescription if Dr. Martinique changes medication.  This is a new medication she has started taking.

## 2010-08-27 ENCOUNTER — Ambulatory Visit (INDEPENDENT_AMBULATORY_CARE_PROVIDER_SITE_OTHER): Payer: Medicare Other | Admitting: Cardiology

## 2010-08-27 ENCOUNTER — Encounter: Payer: Self-pay | Admitting: Cardiology

## 2010-08-27 VITALS — BP 152/80 | HR 60 | Ht 62.0 in | Wt 116.0 lb

## 2010-08-27 DIAGNOSIS — I10 Essential (primary) hypertension: Secondary | ICD-10-CM

## 2010-08-27 MED ORDER — ASPIRIN EC 81 MG PO TBEC: 81.0000 mg | DELAYED_RELEASE_TABLET | Freq: Every day | ORAL | Status: AC

## 2010-08-27 MED ORDER — ALISKIREN FUMARATE 300 MG PO TABS: 300.0000 mg | ORAL_TABLET | Freq: Every day | ORAL | Status: AC

## 2010-08-27 NOTE — Assessment & Plan Note (Signed)
She has had some increase in the lability of her blood pressure recently. Pending this may be related to the stress of her move and of caring for her grandchildren. She has been difficult to control given her multiple drug intolerances. I recommended continuing on Tekturna 300 mg daily and Bystolic 5 mg daily. We will continue to monitor her blood pressure for that period of time. If she has sustained elevation of her readings in we may need to consider a trial of hydralazine. I do not think she would tolerate clonidine well but this would also be an option.

## 2010-08-27 NOTE — Progress Notes (Signed)
[name] Date of Birth: [birth year]   History of Present Illness: [name] is seen today for followup of her labile blood pressure. She reports that she has been seen on 2 or 3 occasions at [facility] for labile elevation her blood pressure. At one time she was felt to be dehydrated. She has been under increased stress this summer since she moved to a new residence in June. She denies any dizziness or syncope. She has had no chest pain or shortness of breath. She denies using any over-the-counter medications. She has been intolerant to multiple medications in the past. ACE inhibitors caused worsening of her renal function and elevation of her potassium. For this reason she is also not been tried on angiotensin receptor blockers. HCTZ also resulted in worsening renal insufficiency. Amlodipine caused swelling. She has had extensive evaluation for secondary causes of hypertension including renal artery duplex Doppler and evaluation for pheochromocytoma which have all been negative. She has done relatively well on Tekturna and Bystolic.  Current Outpatient Prescriptions on File Prior to Visit  Medication Sig Dispense Refill  . ALPRAZolam (XANAX) 0.25 MG tablet Take 0.25 mg by mouth at bedtime as needed.        . cholecalciferol (VITAMIN D) 1000 UNITS tablet Take 1,000 Units by mouth daily.        . Coenzyme Q10 (COQ10) 50 MG CAPS Take 1 capsule by mouth daily.        Marland Kitchen gabapentin (NEURONTIN) 100 MG capsule Take 1 capsule (100 mg total) by mouth 3 (three) times daily.  90 capsule  0  . gabapentin (NEURONTIN) 300 MG capsule Take 100 mg by mouth 3 (three) times daily.       Marland Kitchen HYDROcodone-acetaminophen (VICODIN) 5-500 MG per tablet take 1 tablet by mouth twice a day if needed  60 tablet  1  . nebivolol (BYSTOLIC) 5 MG tablet Take 5 mg by mouth daily.       . nitrofurantoin (MACRODANTIN) 100 MG capsule Take 100 mg by mouth 2 (two) times daily.        Marland Kitchen omeprazole (PRILOSEC) 20 MG  capsule Take 20 mg by mouth as needed.       . zolpidem (AMBIEN) 10 MG tablet take 1 tablet by mouth at bedtime if needed for insomnia  30 tablet  1    Allergies  Allergen Reactions  . Doxycycline Monohydrate     REACTION: Vomitting  . Ramipril     REACTION: worsening renal function    Past Medical History  Diagnosis Date  . History of diverticulitis of colon   . HLD (hyperlipidemia)   . Anxiety   . OP (osteoporosis)   . Depression   . GERD (gastroesophageal reflux disease)   . IBS (irritable bowel syndrome)   . Glucose intolerance (impaired glucose tolerance)   . Allergic rhinitis   . Migraine   . HTN (hypertension)     Dr. Martinique, cards  . SUI (stress urinary incontinence, female)     Dr. Amalia Hailey, urology (some urge as well)  . Diverticulosis     colonsocopy 2002 aborted 2/2 tortuous colon and heavy sigmoid diverticulosis  . Dysphagia     EGD 2012 - wnl, s/p esoph dilation    Past Surgical History  Procedure Date  . Tonsillectomy 1942  . Submucous resection 1962  . Tubal ligation 1973  . Breast enhancement surgery 1982  . Total abdominal hysterectomy 1996    for frequent dysmennorhea  . Macroplastique  implantation 03/2008    Dr. Rogers Blocker  . Uretherolysis and removal of macroplastique 09/13/08    Dr. Amalia Hailey  . Ct head limited w/o cm 04/2010    nothing acute, ? old infarcts L internal capsule  . Renal doppler 2009?    simple cysts, wnl  . Colonoscopy 2002    does not want another!  . Esophagogastroduodenoscopy 2012    WNL, s/p esoph dilation (Dr. Julieta Gutting)    History  Smoking status  . Former Smoker  Smokeless tobacco  . Not on file    History  Alcohol Use  . Yes    1 1/2-2 glasses white wine/day    Family History  Problem Relation Age of Onset  . Heart failure Father   . Heart disease Father   . Cancer Father     prostate  . Hypertension Father   . Coronary artery disease Father   . Leukemia Mother     CLL prior    Review of Systems: The  review of systems is positive for labile blood pressure readings.  All other systems were reviewed and are negative.  Physical Exam: BP 152/80  Pulse 60  Ht 5\' 2"  (1.575 m)  Wt 116 lb (52.617 kg)  BMI 21.22 kg/m2 She is a pleasant elderly white female in no acute distress. She is normocephalic, atraumatic. Pupils are equal round and reactive to light accommodation. Extraocular movements are full. Oropharynx is clear. Neck is supple without JVD, adenopathy, thyromegaly, or bruits. Lungs are clear. Cardiac exam reveals a regular rate and rhythm without gallop, murmur, or click. Abdomen is soft and nontender. She has no masses or bruits. Extremities are without edema. Pedal pulses are good. Her neurologic exam is nonfocal. LABORATORY DATA:   Assessment / Plan:

## 2010-09-07 NOTE — Progress Notes (Signed)
Addended by: Lowry Ram on: 09/07/2010 04:04 PM   Modules accepted: Level of Service

## 2010-09-08 ENCOUNTER — Other Ambulatory Visit: Payer: Self-pay | Admitting: Family Medicine

## 2010-09-08 NOTE — Telephone Encounter (Signed)
plz phone in and notify pt. 

## 2010-09-09 NOTE — Telephone Encounter (Signed)
Rx called in as directed.   

## 2010-09-15 ENCOUNTER — Other Ambulatory Visit: Payer: Self-pay | Admitting: Family Medicine

## 2010-09-16 ENCOUNTER — Telehealth: Payer: Self-pay | Admitting: *Deleted

## 2010-09-16 MED ORDER — GABAPENTIN 100 MG PO CAPS: 100.0000 mg | ORAL_CAPSULE | Freq: Every day | ORAL | Status: DC

## 2010-09-16 NOTE — Telephone Encounter (Signed)
Spoke with patient. She said she was taking 100 mg three times daily. I explained that her last office note said she was decreased to 100 mg at night. She said that was incorrect and that she had been decreased from 300 mg three times daily to 100 mg three times daily. Pharmacy notified. Refilled with # 90 and 6 refills.

## 2010-09-16 NOTE — Telephone Encounter (Signed)
Please clarify with patient which is right script then notify pharmacy. Last OV we had backed off to 100mg  nightly to see if would help with dry mouth, see if this is what she's taking currently (instead of 100mg  tid).

## 2010-09-16 NOTE — Telephone Encounter (Signed)
[facility] received 2 scripts for gabapentin 100 mg's with different directions, please advised on which is correct script.

## 2010-10-07 ENCOUNTER — Other Ambulatory Visit: Payer: Self-pay | Admitting: Family Medicine

## 2010-10-08 NOTE — Telephone Encounter (Signed)
Please phone in

## 2010-10-08 NOTE — Telephone Encounter (Signed)
Rx's called in as directed.  

## 2010-11-10 ENCOUNTER — Telehealth: Payer: Self-pay | Admitting: Cardiology

## 2010-11-10 DIAGNOSIS — R0602 Shortness of breath: Secondary | ICD-10-CM

## 2010-11-10 NOTE — Telephone Encounter (Signed)
Patient calling C/O sob, weakness in leg. Pharmacy advise patient to call her MD due to side effect. Bystolic, Tekturna.

## 2010-11-11 NOTE — Telephone Encounter (Signed)
Called stating since increasing her medication on 8/9 she has been having increasing SOB and weakness in lower extremities. States she talked w/pharmacist and was told that both Bystolic and Marisa Severin can cause SOB and weakness. Is taking Bystolic 5 mg in AM and Tekturna 300 mg in afternoon. Per Dr. Martinique she may break up Tekturna by taking 150 mg in AM and 150 mg in afternoon. Will also get Echo to evaluate SOB. Will call when scheduled.

## 2010-11-16 ENCOUNTER — Other Ambulatory Visit: Payer: Self-pay | Admitting: Family Medicine

## 2010-11-16 NOTE — Telephone Encounter (Signed)
OK to refill

## 2010-11-16 NOTE — Telephone Encounter (Signed)
Please phone in

## 2010-11-16 NOTE — Telephone Encounter (Signed)
Rx called in as directed.   

## 2010-11-19 ENCOUNTER — Ambulatory Visit (HOSPITAL_COMMUNITY): Payer: Medicare Other | Attending: Cardiology | Admitting: Radiology

## 2010-11-19 DIAGNOSIS — R0989 Other specified symptoms and signs involving the circulatory and respiratory systems: Secondary | ICD-10-CM | POA: Insufficient documentation

## 2010-11-19 DIAGNOSIS — I10 Essential (primary) hypertension: Secondary | ICD-10-CM | POA: Insufficient documentation

## 2010-11-19 DIAGNOSIS — R0602 Shortness of breath: Secondary | ICD-10-CM

## 2010-11-19 DIAGNOSIS — E785 Hyperlipidemia, unspecified: Secondary | ICD-10-CM | POA: Insufficient documentation

## 2010-11-19 DIAGNOSIS — R0609 Other forms of dyspnea: Secondary | ICD-10-CM | POA: Insufficient documentation

## 2010-11-19 DIAGNOSIS — I079 Rheumatic tricuspid valve disease, unspecified: Secondary | ICD-10-CM | POA: Insufficient documentation

## 2010-11-19 DIAGNOSIS — I059 Rheumatic mitral valve disease, unspecified: Secondary | ICD-10-CM | POA: Insufficient documentation

## 2010-11-19 HISTORY — PX: US ECHOCARDIOGRAPHY: HXRAD669

## 2010-11-23 ENCOUNTER — Telehealth: Payer: Self-pay | Admitting: *Deleted

## 2010-11-23 ENCOUNTER — Encounter: Payer: Self-pay | Admitting: Family Medicine

## 2010-11-23 MED ORDER — HYDRALAZINE HCL 10 MG PO TABS: 10.0000 mg | ORAL_TABLET | Freq: Three times a day (TID) | ORAL | Status: DC

## 2010-11-23 NOTE — Telephone Encounter (Signed)
Notified of lab results. Will send copy to Dr. Danise [name]. Also wants to know what other meds she can take for her BP. States Marisa Severin is too expensive. Per Dr. Martinique unfortunately has been intolerant of a lot of medications. So will try hydrazaline 10 mg TID; may decrease Tekturna to 150 mg daily. If tolerates Hydrazaline may can stop Tekturna. Will send Rx in for Hydrazaline.

## 2010-12-03 ENCOUNTER — Other Ambulatory Visit (INDEPENDENT_AMBULATORY_CARE_PROVIDER_SITE_OTHER): Payer: Medicare Other

## 2010-12-03 ENCOUNTER — Other Ambulatory Visit: Payer: Self-pay | Admitting: Family Medicine

## 2010-12-03 DIAGNOSIS — D649 Anemia, unspecified: Secondary | ICD-10-CM

## 2010-12-03 DIAGNOSIS — N183 Chronic kidney disease, stage 3 unspecified: Secondary | ICD-10-CM

## 2010-12-03 DIAGNOSIS — E785 Hyperlipidemia, unspecified: Secondary | ICD-10-CM

## 2010-12-03 DIAGNOSIS — I10 Essential (primary) hypertension: Secondary | ICD-10-CM

## 2010-12-03 DIAGNOSIS — R7302 Impaired glucose tolerance (oral): Secondary | ICD-10-CM

## 2010-12-03 DIAGNOSIS — R7309 Other abnormal glucose: Secondary | ICD-10-CM

## 2010-12-03 DIAGNOSIS — E559 Vitamin D deficiency, unspecified: Secondary | ICD-10-CM

## 2010-12-04 ENCOUNTER — Telehealth: Payer: Self-pay | Admitting: Family Medicine

## 2010-12-04 DIAGNOSIS — N189 Chronic kidney disease, unspecified: Secondary | ICD-10-CM

## 2010-12-04 DIAGNOSIS — E875 Hyperkalemia: Secondary | ICD-10-CM

## 2010-12-04 DIAGNOSIS — N179 Acute kidney failure, unspecified: Secondary | ICD-10-CM

## 2010-12-04 LAB — CBC WITH DIFFERENTIAL/PLATELET
Basophils Relative: 1.1 % (ref 0.0–3.0)
Eosinophils Absolute: 0.4 10*3/uL (ref 0.0–0.7)
Eosinophils Relative: 6.2 % — ABNORMAL HIGH (ref 0.0–5.0)
HCT: 33.2 % — ABNORMAL LOW (ref 36.0–46.0)
Lymphs Abs: 2.5 10*3/uL (ref 0.7–4.0)
MCHC: 33.5 g/dL (ref 30.0–36.0)
MCV: 93.1 fl (ref 78.0–100.0)
Monocytes Absolute: 0.6 10*3/uL (ref 0.1–1.0)
Platelets: 262 10*3/uL (ref 150.0–400.0)
WBC: 6.6 10*3/uL (ref 4.5–10.5)

## 2010-12-04 LAB — TSH: TSH: 2.94 u[IU]/mL (ref 0.35–5.50)

## 2010-12-04 LAB — BASIC METABOLIC PANEL
BUN: 43 mg/dL — ABNORMAL HIGH (ref 6–23)
Calcium: 9.8 mg/dL (ref 8.4–10.5)
GFR: 24.84 mL/min — ABNORMAL LOW (ref >60.00)
Glucose, Bld: 91 mg/dL (ref 70–99)
Sodium: 151 mEq/L — ABNORMAL HIGH (ref 135–145)

## 2010-12-04 MED ORDER — SODIUM POLYSTYRENE SULFONATE 15 GM/60ML PO SUSP: 15.0000 g | Freq: Once | ORAL | Status: AC

## 2010-12-04 NOTE — Telephone Encounter (Signed)
Called pt and discussed abnormal potassium and sodium.  Will send in kayexalate, advised pt to pick up tonight. Has had viral sxs - some diarrhea as well.  Voiding fine. Had been on increased dose of tekturna 300mg , stopped 1 wk ago. Started hydralazine, continued bystolic.  Has seen nephrologist in past.  Can we call pt for rpt blood work on Monday or Tuesday?  thakns.

## 2010-12-07 ENCOUNTER — Other Ambulatory Visit (INDEPENDENT_AMBULATORY_CARE_PROVIDER_SITE_OTHER): Payer: Medicare Other

## 2010-12-07 DIAGNOSIS — E875 Hyperkalemia: Secondary | ICD-10-CM

## 2010-12-07 DIAGNOSIS — N179 Acute kidney failure, unspecified: Secondary | ICD-10-CM

## 2010-12-07 LAB — RENAL FUNCTION PANEL
BUN: 28 mg/dL — ABNORMAL HIGH (ref 6–23)
CO2: 25 mEq/L (ref 19–32)
Calcium: 9 mg/dL (ref 8.4–10.5)
Creatinine, Ser: 1.7 mg/dL — ABNORMAL HIGH (ref 0.4–1.2)

## 2010-12-07 NOTE — Telephone Encounter (Signed)
Spoke with patient and she had already had repeat labs this morning prior to my call. She will await results.

## 2010-12-09 ENCOUNTER — Telehealth: Payer: Self-pay | Admitting: Family Medicine

## 2010-12-09 ENCOUNTER — Encounter: Payer: Self-pay | Admitting: Family Medicine

## 2010-12-09 ENCOUNTER — Ambulatory Visit (INDEPENDENT_AMBULATORY_CARE_PROVIDER_SITE_OTHER): Payer: Medicare Other | Admitting: Family Medicine

## 2010-12-09 VITALS — BP 128/60 | HR 62 | Temp 98.7°F | Ht 62.0 in | Wt 120.0 lb

## 2010-12-09 DIAGNOSIS — E559 Vitamin D deficiency, unspecified: Secondary | ICD-10-CM

## 2010-12-09 DIAGNOSIS — D649 Anemia, unspecified: Secondary | ICD-10-CM

## 2010-12-09 DIAGNOSIS — I10 Essential (primary) hypertension: Secondary | ICD-10-CM

## 2010-12-09 DIAGNOSIS — M810 Age-related osteoporosis without current pathological fracture: Secondary | ICD-10-CM

## 2010-12-09 DIAGNOSIS — E875 Hyperkalemia: Secondary | ICD-10-CM

## 2010-12-09 DIAGNOSIS — E785 Hyperlipidemia, unspecified: Secondary | ICD-10-CM

## 2010-12-09 DIAGNOSIS — Z23 Encounter for immunization: Secondary | ICD-10-CM

## 2010-12-09 DIAGNOSIS — Z0000 Encounter for general adult medical examination without abnormal findings: Secondary | ICD-10-CM | POA: Insufficient documentation

## 2010-12-09 DIAGNOSIS — N179 Acute kidney failure, unspecified: Secondary | ICD-10-CM

## 2010-12-09 LAB — POTASSIUM: Potassium: 5.8 mEq/L — ABNORMAL HIGH (ref 3.5–5.1)

## 2010-12-09 NOTE — Assessment & Plan Note (Addendum)
Reviewed numbers on recent blood work. intolerant of ACEI/ARB/tekturna Reviewed importance of hydration status.

## 2010-12-09 NOTE — Assessment & Plan Note (Addendum)
Chronic, labile. Tolerating bystolic and hydralazine well, doing ok with tid dosing of hydralazine. Continue. Will route note to Dr. Martinique as fyi recent blood work.

## 2010-12-09 NOTE — Patient Instructions (Addendum)
Blood work today. If potassium staying abnormal, may do short course of lasix.  Otherwise, will continue to monitor for now. Continue blood pressure meds as up to now. Good to see you today, call us with questions. Tdap today. Return as needed or in 1 year for next physical.

## 2010-12-09 NOTE — Assessment & Plan Note (Addendum)
Anticipate due to CKD.  hgb >10.

## 2010-12-09 NOTE — Assessment & Plan Note (Signed)
Stable.  Off meds.

## 2010-12-09 NOTE — Telephone Encounter (Signed)
Can we have pt pass by to pick up iFOB for colon screening as last colonoscopy 2002, and we don't want to repeat that.  Placed iFOB letter in kim's box.

## 2010-12-09 NOTE — Assessment & Plan Note (Signed)
On calcium/vit D supplements.  Declines rpt dexa.

## 2010-12-09 NOTE — Assessment & Plan Note (Addendum)
I have personally reviewed the Medicare Annual Wellness questionnaire and have noted 1. The patient's medical and social history 2. Their use of alcohol, tobacco or illicit drugs 3. Their current medications and supplements 4. The patient's functional ability including ADL's, fall risks, home safety risks and hearing or visual impairment. 5. Diet and physical activities 6. Evidence for depression or mood disorders The patients weight, height, BMI have been recorded in the chart.  Hearing and vision has been addressed. I have made referrals, counseling and provided education to the patient based review of the above and I have provided the pt with a written personalized care plan for preventive services.  Tdap today per pt preference as has grandchild due 01/2011.  Discussed likely will not be covered by medicare. Colonoscopy 2002, did not visualize cecum to descending colon, pt does not want to have repeated. Will ask pt to submit iFOB

## 2010-12-09 NOTE — Assessment & Plan Note (Signed)
Improving.  Pt thinks may not have been staying hydrated as well as she could have in last few weeks.

## 2010-12-09 NOTE — Assessment & Plan Note (Signed)
Vit D today good range, continue 1000IU daily.

## 2010-12-09 NOTE — Telephone Encounter (Signed)
Spoke with patient and she was confused as to why she didn't get it at her appt, but she will stop by next week to pick up the IFOB.

## 2010-12-09 NOTE — Assessment & Plan Note (Signed)
S/p kayexalate. If potassium staying elevated, may consider short course of lasix as pt endorses feeling retaining more fluid although on exam no dependent edema.

## 2010-12-09 NOTE — Progress Notes (Signed)
Subjective:    Patient ID: Nicole English, female    DOB: 01-30-1932, 77 y.o.   MRN: [id number]  HPI CC: medicare wellness today.  Here for medicare physical today.  HTN - occasional dizziness.  Some headaches.  Changed tekturna to hydralazine, continues bystolic 5mg  daily.  Intermittent shortness of breath, leg swelling.  Stopped tekturna 2/2 did not like how it was affecting her on increased dose (300mg ).  On fasting physical blood work last week found to have ARF with Cr to 2.1 and hyperkalemia to 6.0, treated with kayexalate and on rpt kidneys improved some with Cr 1.7 and K down to 5.2.  Noting more swelling around abdomen.  osteoporosis- takes sea calcium/vit D 1000IU. last Dexa 2005 osteoporosis. Vit D 54 this month.  Has never been on bisphosphonate.  Wt Readings from Last 3 Encounters:  12/09/10 120 lb (54.432 kg)  08/27/10 116 lb (52.617 kg)  07/10/10 118 lb (53.524 kg)   Preventative: flu shot at pharmacy 2012 tetanus shot 2004, has grandson due 01/2010 Pneumovax completed. zostavax completed. no paps - hysterectomy. Pelvic last year WNL. mammogram done 02/2010.  Requests defer breast exam today, rpt next year.  Does SBE at home, no concerns. H/o osteoporosis - dexa remotely, doesn't want to repeat.  On vitamin d and sea calcium 4 tablets daily. Vision ok - last screen 04/2010.  Hearing ok.  Colonoscopy 04/2000 aborted 2/2 tortuous colon and diverticulosis, descending colon to cecum not visualized, does not want another  Medications and allergies reviewed and updated in chart.  Past histories reviewed and updated if relevant as below. Patient Active Problem List  Diagnoses  . VITAMIN D DEFICIENCY  . ANEMIA, NORMOCYTIC  . CKD (chronic kidney disease) stage 3, GFR 30-59 ml/min  . LOW BACK PAIN  . OSTEOPOROSIS  . INSOMNIA-SLEEP DISORDER-UNSPEC  . DIVERTICULITIS, HX OF  . History of diverticulitis of colon  . HLD (hyperlipidemia)  . Anxiety  . Depression  . GERD  (gastroesophageal reflux disease)  . IBS (irritable bowel syndrome)  . Glucose intolerance (impaired glucose tolerance)  . Allergic rhinitis  . Migraine  . HTN (hypertension)  . SUI (stress urinary incontinence, female)  . Abdominal pain, epigastric  . Dry mouth  . Dysphagia  . Acute on chronic renal failure  . Hyperkalemia   Past Medical History  Diagnosis Date  . History of diverticulitis of colon   . HLD (hyperlipidemia)   . Anxiety   . OP (osteoporosis)   . Depression   . GERD (gastroesophageal reflux disease)   . IBS (irritable bowel syndrome)   . Glucose intolerance (impaired glucose tolerance)   . Allergic rhinitis   . Migraine   . HTN (hypertension)     Dr. Martinique, cards  . SUI (stress urinary incontinence, female)     Dr. Amalia Hailey, urology (some urge as well)  . Diverticulosis     colonsocopy 2002 aborted 2/2 tortuous colon and heavy sigmoid diverticulosis  . Dysphagia     EGD 2012 - wnl, s/p esoph dilation   Past Surgical History  Procedure Date  . Tonsillectomy 1942  . Submucous resection 1962  . Tubal ligation 1973  . Breast enhancement surgery 1982  . Total abdominal hysterectomy 1996    for frequent dysmennorhea  . Macroplastique implantation 03/2008    Dr. Rogers Blocker  . Uretherolysis and removal of macroplastique 09/13/08    Dr. Amalia Hailey  . Ct head limited w/o cm 04/2010    nothing acute, ? old infarcts L  internal capsule  . Renal doppler 2009?    simple cysts, wnl  . Colonoscopy 2002    does not want another!  . Esophagogastroduodenoscopy 2012    WNL, s/p esoph dilation (Dr. Candace Cruise, [facility])  . US echocardiography 11/2010    EF 55-60%, mild LAE, mild diastolic dysfunction, normal valves   History  Substance Use Topics  . Smoking status: Former Research scientist (life sciences)  . Smokeless tobacco: Never Used  . Alcohol Use: Yes     1 1/2-2 glasses white wine/day   Family History  Problem Relation Age of Onset  . Heart failure Father   . Heart disease Father   . Cancer Father      prostate  . Hypertension Father   . Coronary artery disease Father   . Leukemia Mother     CLL prior   Allergies  Allergen Reactions  . Doxycycline Monohydrate     REACTION: Vomitting  . Ramipril     REACTION: worsening renal function   Current Outpatient Prescriptions on File Prior to Visit  Medication Sig Dispense Refill  . ALPRAZolam (XANAX) 0.25 MG tablet take 1 tablet by mouth twice a day if needed  60 tablet  3  . aspirin EC 81 MG tablet Take 1 tablet (81 mg total) by mouth daily.  150 tablet  2  . cholecalciferol (VITAMIN D) 1000 UNITS tablet Take 1,000 Units by mouth daily.        . Coenzyme Q10 (COQ10) 50 MG CAPS Take 1 capsule by mouth daily.        Marland Kitchen gabapentin (NEURONTIN) 100 MG capsule Take 1 capsule (100 mg total) by mouth at bedtime.  30 capsule  6  . hydrALAZINE (APRESOLINE) 10 MG tablet Take 1 tablet (10 mg total) by mouth 3 (three) times daily.  90 tablet  5  . HYDROcodone-acetaminophen (VICODIN) 5-500 MG per tablet take tablet by mouth twice a day if needed  60 tablet  0  . nebivolol (BYSTOLIC) 5 MG tablet Take 5 mg by mouth daily.       Marland Kitchen omeprazole (PRILOSEC) 20 MG capsule Take 20 mg by mouth as needed.       . zolpidem (AMBIEN) 10 MG tablet take 1 tablet by mouth at bedtime if needed  30 tablet  3   Review of Systems  Constitutional: Negative for fever, chills, activity change, appetite change, fatigue and unexpected weight change.  HENT: Negative for hearing loss and neck pain.   Eyes: Negative for visual disturbance.  Respiratory: Positive for shortness of breath. Negative for cough, chest tightness and wheezing.   Cardiovascular: Negative for chest pain, palpitations and leg swelling.  Gastrointestinal: Negative for nausea, vomiting, abdominal pain, diarrhea, constipation, blood in stool and abdominal distention.  Genitourinary: Negative for hematuria and difficulty urinating.  Musculoskeletal: Negative for myalgias and arthralgias.  Skin: Negative for  rash.  Neurological: Positive for headaches. Negative for dizziness, seizures and syncope.  Hematological: Bruises/bleeds easily.  Psychiatric/Behavioral: Negative for dysphoric mood. The patient is not nervous/anxious.        Objective:   Physical Exam  Nursing note and vitals reviewed. Constitutional: She is oriented to person, place, and time. She appears well-developed and well-nourished. No distress.  HENT:  Head: Normocephalic and atraumatic.  Right Ear: Hearing, tympanic membrane, external ear and ear canal normal.  Left Ear: Hearing, tympanic membrane, external ear and ear canal normal.  Nose: Nose normal. No mucosal edema or rhinorrhea.  Mouth/Throat: Uvula is midline, oropharynx is clear and  moist and mucous membranes are normal. No oropharyngeal exudate, posterior oropharyngeal erythema or tonsillar abscesses.  Eyes: Conjunctivae and EOM are normal. Pupils are equal, round, and reactive to light. No scleral icterus.  Neck: Normal range of motion. Neck supple.  Cardiovascular: Normal rate, regular rhythm, normal heart sounds and intact distal pulses.   No murmur heard. Pulses:      Radial pulses are 2+ on the right side, and 2+ on the left side.  Pulmonary/Chest: Effort normal and breath sounds normal. No respiratory distress. She has no wheezes. She has no rales.  Abdominal: Soft. Bowel sounds are normal. She exhibits no distension and no mass. There is no tenderness. There is no rebound and no guarding.  Musculoskeletal: Normal range of motion. She exhibits no edema.  Lymphadenopathy:    She has no cervical adenopathy.  Neurological: She is alert and oriented to person, place, and time.       CN grossly intact, station and gait intact  Skin: Skin is warm and dry. No rash noted.  Psychiatric: She has a normal mood and affect. Her behavior is normal. Judgment and thought content normal.       Assessment & Plan:

## 2010-12-10 NOTE — Telephone Encounter (Signed)
Did not review actual colonoscopy report from 2002 until pt left.

## 2010-12-11 ENCOUNTER — Telehealth: Payer: Self-pay | Admitting: Family Medicine

## 2010-12-11 DIAGNOSIS — E875 Hyperkalemia: Secondary | ICD-10-CM

## 2010-12-11 DIAGNOSIS — N189 Chronic kidney disease, unspecified: Secondary | ICD-10-CM

## 2010-12-11 DIAGNOSIS — N179 Acute kidney failure, unspecified: Secondary | ICD-10-CM

## 2010-12-11 MED ORDER — SODIUM POLYSTYRENE SULFONATE 15 GM/60ML PO SUSP: 15.0000 g | Freq: Once | ORAL | Status: AC

## 2010-12-11 MED ORDER — FUROSEMIDE 20 MG PO TABS: 10.0000 mg | ORAL_TABLET | Freq: Every day | ORAL | Status: DC

## 2010-12-11 NOTE — Telephone Encounter (Signed)
Please notify potassium level has returned elevated again at 5.8 I would like her to take another dose of kayexalate (Sent to [place]) and to take 5d course of lasix 10mg  (sent to [place]). I'd like her to return mid next week to recheck potassium values. (not sure who to send to today so sent to [facility], dee and kim)

## 2010-12-11 NOTE — Telephone Encounter (Signed)
Patient advised as instructed via telephone.  Lab appt scheduled for 12/16/2010.

## 2010-12-16 ENCOUNTER — Other Ambulatory Visit (INDEPENDENT_AMBULATORY_CARE_PROVIDER_SITE_OTHER): Payer: Medicare Other

## 2010-12-16 DIAGNOSIS — N179 Acute kidney failure, unspecified: Secondary | ICD-10-CM

## 2010-12-16 DIAGNOSIS — E875 Hyperkalemia: Secondary | ICD-10-CM

## 2010-12-16 LAB — RENAL FUNCTION PANEL
Chloride: 106 mEq/L (ref 96–112)
GFR: 30.79 mL/min — ABNORMAL LOW (ref >60.00)
Phosphorus: 5 mg/dL — ABNORMAL HIGH (ref 2.3–4.6)
Potassium: 4.7 mEq/L (ref 3.5–5.1)
Sodium: 136 mEq/L (ref 135–145)

## 2010-12-20 ENCOUNTER — Other Ambulatory Visit: Payer: Self-pay | Admitting: Cardiology

## 2010-12-25 ENCOUNTER — Telehealth: Payer: Self-pay | Admitting: Cardiology

## 2010-12-25 NOTE — Telephone Encounter (Signed)
Called stating BP has still been elevated. Today 145/90 this AM; at 1:00 169/95; at 2:20 182/99. Also states she is taking Hydralazine 10 mg q 8 hrs but about 1 hr before next dose is due she get  tingling and numbness on tongue and headache in "back of head". Per Dr. Martinique will increase Hydralazine to 25 mg TID. She states she has enough medication because she just got a refill. Will call us when needs new Rx. Will continue to monitor BP.

## 2010-12-25 NOTE — Telephone Encounter (Signed)
New Msg: Pt calling stating that she has a spike in BP and pt wants to know what is advised she do. Please return pt call to discuss further.

## 2010-12-28 ENCOUNTER — Telehealth: Payer: Self-pay | Admitting: Cardiology

## 2010-12-28 NOTE — Telephone Encounter (Signed)
New Msg: Pt calling stating that she still has same symptoms that pt had Friday when MD changed pt medications. Pt said meds are not helping pt symptoms. Please return pt call to discuss further.

## 2010-12-28 NOTE — Telephone Encounter (Signed)
Called stating that BP is still elevated. BP 141/84;164/114 over the week-end. States she is having symptoms of numbness and tingling 2 hrs prior to taking medication. She is really running out of options for BP. Can not tolerate increase doses of Tekturna, Bystolic, HCTZ and Hydralazine. Spoke w/Lori Servando Snare, NP and she suggested Benicar 20 mg. Also Cecille Rubin doesn't feel that she has been on increase dose of Hydralazine 25 mg TID long enough to bring BP down. Ms. Cockrill really doesn't want to try another medication. Advised her that I will talk w/Dr. Martinique on Wed but if she feels worse tomorrow or if BP is still elevated to call back tomorrow. She is agreeable to that plan.

## 2010-12-30 ENCOUNTER — Telehealth: Payer: Self-pay | Admitting: *Deleted

## 2010-12-30 MED ORDER — OLMESARTAN MEDOXOMIL 20 MG PO TABS: 20.0000 mg | ORAL_TABLET | Freq: Every day | ORAL | Status: DC

## 2010-12-30 NOTE — Telephone Encounter (Signed)
Spoke w/Dr. Martinique regarding BP issues. He suggested starting Benicar 20 mg daily in addition to Hydralazine. She states past 2 days BP still goes up and down. Usually lower in mornings and higher at night. Advised to continue to monitor BP. States she is still dizzy and having numbness and tingling 1 1/2 hr AFTER taking medications.

## 2010-12-31 ENCOUNTER — Other Ambulatory Visit: Payer: Medicare Other

## 2010-12-31 DIAGNOSIS — Z1211 Encounter for screening for malignant neoplasm of colon: Secondary | ICD-10-CM

## 2011-01-01 ENCOUNTER — Encounter: Payer: Self-pay | Admitting: *Deleted

## 2011-01-04 ENCOUNTER — Other Ambulatory Visit: Payer: Self-pay | Admitting: Cardiology

## 2011-01-04 MED ORDER — HYDRALAZINE HCL 25 MG PO TABS: 25.0000 mg | ORAL_TABLET | Freq: Three times a day (TID) | ORAL | Status: DC

## 2011-01-04 NOTE — Telephone Encounter (Signed)
Called requesting refill on increase dose of Hydralazine 25 mg TID. Sent to [facility].

## 2011-01-04 NOTE — Telephone Encounter (Signed)
New message:  Pt needs a new prescription for Hydralazine 25 mg.  Please call this into [facility] on [address].

## 2011-01-05 ENCOUNTER — Other Ambulatory Visit: Payer: Self-pay | Admitting: Internal Medicine

## 2011-01-05 MED ORDER — FUROSEMIDE 20 MG PO TABS: 10.0000 mg | ORAL_TABLET | Freq: Every day | ORAL | Status: DC | PRN

## 2011-01-05 NOTE — Telephone Encounter (Signed)
Patient would like another refill on her Furosemide. She states that she only received a few pills and she still has fluid.   Is this Ok.

## 2011-01-05 NOTE — Telephone Encounter (Signed)
Message left notifying patient to use Rx sparingly due to kidneys. Advised to call with any questions or concerns.

## 2011-01-05 NOTE — Telephone Encounter (Signed)
Have sent in small refill, to use prn, but need to use sparingly because can affect kidneys.

## 2011-01-07 ENCOUNTER — Telehealth: Payer: Self-pay | Admitting: Family Medicine

## 2011-01-07 NOTE — Telephone Encounter (Signed)
Questions about Dr. Darnell Level recommendations to take sparingly.  Patient needs clarification on what that means  Needs further instructions.  Pt also mentioned Kidney disease and she'd like more information about this.  Please return the call.  Thanks

## 2011-01-08 NOTE — Telephone Encounter (Signed)
Message left advising patient to take lasix only as needed, not everyday. Advised this is due to her K+ being low previously and also her kidney function not being ideal. Advised it can deplete K+ and it can cause an additional decrease in kidney function. Therefore she shouldn't take it everyday but only when swelling is significant. Advised to call with anymore questions.

## 2011-01-12 ENCOUNTER — Other Ambulatory Visit: Payer: Self-pay | Admitting: Family Medicine

## 2011-01-13 ENCOUNTER — Other Ambulatory Visit: Payer: Self-pay | Admitting: *Deleted

## 2011-01-13 NOTE — Telephone Encounter (Signed)
OK to refill in Dr. Synthia Innocent absence?

## 2011-01-13 NOTE — Telephone Encounter (Signed)
Rx called in as directed.   

## 2011-01-13 NOTE — Telephone Encounter (Signed)
Opened in error, refill already taken care of.

## 2011-01-22 ENCOUNTER — Telehealth: Payer: Self-pay | Admitting: Cardiology

## 2011-01-22 ENCOUNTER — Telehealth: Payer: Self-pay | Admitting: Family Medicine

## 2011-01-22 ENCOUNTER — Other Ambulatory Visit: Payer: Self-pay

## 2011-01-22 MED ORDER — FUROSEMIDE 20 MG PO TABS: 10.0000 mg | ORAL_TABLET | Freq: Every day | ORAL | Status: DC | PRN

## 2011-01-22 MED ORDER — NEBIVOLOL HCL 5 MG PO TABS: 5.0000 mg | ORAL_TABLET | Freq: Every day | ORAL | Status: DC

## 2011-01-22 MED ORDER — HYDRALAZINE HCL 25 MG PO TABS: 25.0000 mg | ORAL_TABLET | Freq: Three times a day (TID) | ORAL | Status: DC

## 2011-01-22 MED ORDER — OLMESARTAN MEDOXOMIL 20 MG PO TABS: 20.0000 mg | ORAL_TABLET | Freq: Every day | ORAL | Status: DC

## 2011-01-22 NOTE — Telephone Encounter (Signed)
Nerw Problem:     Patient called because she is switching to a new pharmacy and needs new prescriptions written and mailed to her.

## 2011-01-22 NOTE — Telephone Encounter (Signed)
Pt sent in request for all prescriptions to change pharmacies due to Medicare and needs all prescriptions re-written and mailed to her but she has not received.  Please call patient back today.  She is at point that she needs her medications/refills this weekend.

## 2011-01-25 NOTE — Telephone Encounter (Signed)
Patient called and told written prescriptions for bystolic 5mg ,benicar 20 mg,hydralazine 25 mg,furosemide20mg  mailed today.

## 2011-01-29 ENCOUNTER — Other Ambulatory Visit: Payer: Self-pay | Admitting: Internal Medicine

## 2011-01-29 DIAGNOSIS — L820 Inflamed seborrheic keratosis: Secondary | ICD-10-CM | POA: Diagnosis not present

## 2011-01-29 DIAGNOSIS — B079 Viral wart, unspecified: Secondary | ICD-10-CM | POA: Diagnosis not present

## 2011-01-29 DIAGNOSIS — D485 Neoplasm of uncertain behavior of skin: Secondary | ICD-10-CM | POA: Diagnosis not present

## 2011-01-29 MED ORDER — GABAPENTIN 100 MG PO CAPS: 100.0000 mg | ORAL_CAPSULE | Freq: Three times a day (TID) | ORAL | Status: DC

## 2011-01-29 MED ORDER — ALPRAZOLAM 0.25 MG PO TABS: 0.2500 mg | ORAL_TABLET | Freq: Two times a day (BID) | ORAL | Status: DC | PRN

## 2011-01-29 MED ORDER — HYDROCODONE-ACETAMINOPHEN 5-500 MG PO TABS: 1.0000 | ORAL_TABLET | Freq: Two times a day (BID) | ORAL | Status: DC | PRN

## 2011-01-29 MED ORDER — ZOLPIDEM TARTRATE 10 MG PO TABS: 10.0000 mg | ORAL_TABLET | Freq: Every evening | ORAL | Status: DC | PRN

## 2011-01-29 NOTE — Telephone Encounter (Signed)
Patient states she needs her Rx printed out that you have prescribed her so she can mail them into her pharmacy.  Hydrocodone,Gabapentin and Xanax and Ambien.

## 2011-01-29 NOTE — Telephone Encounter (Signed)
Done. Durene Cal in 3 mo supply of gabapentin for 1 year. However other meds are 1 mo at a time, sent in 3 refills.

## 2011-02-01 NOTE — Telephone Encounter (Signed)
Patient notified and prescriptions mailed to patient per her request.

## 2011-02-14 ENCOUNTER — Encounter (HOSPITAL_COMMUNITY): Payer: Self-pay | Admitting: *Deleted

## 2011-02-14 ENCOUNTER — Emergency Department (HOSPITAL_COMMUNITY)
Admission: EM | Admit: 2011-02-14 | Discharge: 2011-02-14 | Disposition: A | Discharge status: Home / Self Care | Payer: Medicare Other | Attending: Emergency Medicine | Admitting: Emergency Medicine

## 2011-02-14 ENCOUNTER — Emergency Department (HOSPITAL_COMMUNITY): Payer: Medicare Other

## 2011-02-14 DIAGNOSIS — S52509A Unspecified fracture of the lower end of unspecified radius, initial encounter for closed fracture: Secondary | ICD-10-CM

## 2011-02-14 DIAGNOSIS — M25539 Pain in unspecified wrist: Secondary | ICD-10-CM | POA: Insufficient documentation

## 2011-02-14 DIAGNOSIS — E785 Hyperlipidemia, unspecified: Secondary | ICD-10-CM | POA: Insufficient documentation

## 2011-02-14 DIAGNOSIS — Z7982 Long term (current) use of aspirin: Secondary | ICD-10-CM | POA: Insufficient documentation

## 2011-02-14 DIAGNOSIS — M25439 Effusion, unspecified wrist: Secondary | ICD-10-CM | POA: Diagnosis not present

## 2011-02-14 DIAGNOSIS — N183 Chronic kidney disease, stage 3 unspecified: Secondary | ICD-10-CM | POA: Diagnosis not present

## 2011-02-14 DIAGNOSIS — K219 Gastro-esophageal reflux disease without esophagitis: Secondary | ICD-10-CM | POA: Diagnosis not present

## 2011-02-14 DIAGNOSIS — M810 Age-related osteoporosis without current pathological fracture: Secondary | ICD-10-CM | POA: Insufficient documentation

## 2011-02-14 DIAGNOSIS — I129 Hypertensive chronic kidney disease with stage 1 through stage 4 chronic kidney disease, or unspecified chronic kidney disease: Secondary | ICD-10-CM | POA: Insufficient documentation

## 2011-02-14 DIAGNOSIS — Z79899 Other long term (current) drug therapy: Secondary | ICD-10-CM | POA: Diagnosis not present

## 2011-02-14 DIAGNOSIS — S52599A Other fractures of lower end of unspecified radius, initial encounter for closed fracture: Secondary | ICD-10-CM | POA: Insufficient documentation

## 2011-02-14 DIAGNOSIS — F341 Dysthymic disorder: Secondary | ICD-10-CM | POA: Insufficient documentation

## 2011-02-14 DIAGNOSIS — K589 Irritable bowel syndrome without diarrhea: Secondary | ICD-10-CM | POA: Insufficient documentation

## 2011-02-14 DIAGNOSIS — M549 Dorsalgia, unspecified: Secondary | ICD-10-CM | POA: Insufficient documentation

## 2011-02-14 DIAGNOSIS — S60219A Contusion of unspecified wrist, initial encounter: Secondary | ICD-10-CM | POA: Diagnosis not present

## 2011-02-14 DIAGNOSIS — M25559 Pain in unspecified hip: Secondary | ICD-10-CM | POA: Insufficient documentation

## 2011-02-14 DIAGNOSIS — W010XXA Fall on same level from slipping, tripping and stumbling without subsequent striking against object, initial encounter: Secondary | ICD-10-CM | POA: Insufficient documentation

## 2011-02-14 MED ORDER — HYDROCODONE-ACETAMINOPHEN 5-325 MG PO TABS
1.0000 | ORAL_TABLET | Freq: Once | ORAL | Status: AC
  Administered 2011-02-14: 1 via ORAL
  Filled 2011-02-14: qty 1

## 2011-02-14 MED ORDER — DOCUSATE SODIUM 100 MG PO CAPS: 100.0000 mg | ORAL_CAPSULE | Freq: Two times a day (BID) | ORAL | Status: AC

## 2011-02-14 MED ORDER — HYDROCODONE-ACETAMINOPHEN 5-325 MG PO TABS: 1.0000 | ORAL_TABLET | ORAL | Status: DC | PRN

## 2011-02-14 NOTE — ED Notes (Signed)
The pt fell on the ice yesterday.  C/o a lt swollen wrist and some minor lower back pain

## 2011-02-14 NOTE — ED Provider Notes (Signed)
History     CSN: [id number]  Arrival date & time 02/14/11  [id number]   First MD Initiated Contact with Patient 02/14/11 [phone]      Chief Complaint  Patient presents with  . Fall    (Consider location/radiation/quality/duration/timing/severity/associated sxs/prior treatment) HPI Comments: Patient presents after having a slip and fall yesterday where she landed on her buttocks and low back as well as her outstretched left hand.  Patient comes in today because she now felt safe driving to the hospital for evaluation and has primarily left wrist pain with some mild swelling present.  Patient is right-hand dominant.  She has no weakness or numbness in her fingertips.  No complaints of pain in her shoulder or elbow.  Patient also notes some very mild bilateral hip pain but is able to ambulate but normal and with ease.  She has no loss of bowel or bladder control.  She has no weakness or numbness in her legs.  Patient notes that this was not a presyncopal event as she had no lightheadedness, dizziness, chest pain or shortness of breath.  Patient did not have loss of consciousness with this episode.  Patient is a 78 y.o. female presenting with fall. The history is provided by the patient. No language interpreter was used.  Fall The accident occurred yesterday. The fall occurred while walking. She landed on concrete. There was no blood loss. The point of impact was the left wrist. The pain is present in the left wrist. The pain is mild. She was ambulatory at the scene. There was no entrapment after the fall. There was no drug use involved in the accident. There was no alcohol use involved in the accident. Pertinent negatives include no visual change, no fever, no numbness, no abdominal pain, no bowel incontinence, no nausea, no vomiting, no hematuria, no headaches, no hearing loss, no loss of consciousness and no tingling.    Past Medical History  Diagnosis Date  . History of diverticulitis of colon   .  HLD (hyperlipidemia)   . Anxiety   . OP (osteoporosis)   . Depression   . GERD (gastroesophageal reflux disease)   . IBS (irritable bowel syndrome)   . Glucose intolerance (impaired glucose tolerance)   . Allergic rhinitis   . Migraine   . HTN (hypertension)     Dr. Martinique, cards  . SUI (stress urinary incontinence, female)     Dr. Amalia Hailey, urology (some urge as well)  . Diverticulosis     colonsocopy 2002 aborted 2/2 tortuous colon and heavy sigmoid diverticulosis  . Dysphagia     EGD 2012 - wnl, s/p esoph dilation  . CKD (chronic kidney disease) stage 3, GFR 30-59 ml/min     Past Surgical History  Procedure Date  . Tonsillectomy 1942  . Submucous resection 1962  . Tubal ligation 1973  . Breast enhancement surgery 1982  . Total abdominal hysterectomy 1996    for frequent dysmennorhea  . Macroplastique implantation 03/2008    Dr. Rogers Blocker  . Uretherolysis and removal of macroplastique 09/13/08    Dr. Amalia Hailey  . Ct head limited w/o cm 04/2010    nothing acute, ? old infarcts L internal capsule  . Renal doppler 2009?    simple cysts, wnl  . Colonoscopy 2002    does not want another!  . Esophagogastroduodenoscopy 2012    WNL, s/p esoph dilation (Dr. Candace Cruise, [facility])  . US echocardiography 11/2010    EF 55-60%, mild LAE, mild diastolic dysfunction, normal valves  Family History  Problem Relation Age of Onset  . Heart failure Father   . Heart disease Father   . Cancer Father     prostate  . Hypertension Father   . Coronary artery disease Father   . Leukemia Mother     CLL prior    History  Substance Use Topics  . Smoking status: Former Research scientist (life sciences)  . Smokeless tobacco: Never Used  . Alcohol Use: Yes     1 1/2-2 glasses white wine/day    OB History    Grav Para Term Preterm Abortions TAB SAB Ect Mult Living                  Review of Systems  Constitutional: Negative.  Negative for fever and chills.  HENT: Negative.   Eyes: Negative.  Negative for discharge and  redness.  Respiratory: Negative.  Negative for cough and shortness of breath.   Cardiovascular: Negative.  Negative for chest pain.  Gastrointestinal: Negative.  Negative for nausea, vomiting, abdominal pain, diarrhea and bowel incontinence.  Genitourinary: Negative.  Negative for dysuria, hematuria and vaginal discharge.  Musculoskeletal: Positive for back pain and joint swelling.  Skin: Negative.  Negative for color change and rash.  Neurological: Negative.  Negative for tingling, loss of consciousness, syncope, numbness and headaches.  Hematological: Negative.  Negative for adenopathy.  Psychiatric/Behavioral: Negative.  Negative for confusion.  All other systems reviewed and are negative.    Allergies  Doxycycline monohydrate and Ramipril  Home Medications   Current Outpatient Rx  Name Route Sig Dispense Refill  . ALPRAZOLAM 0.25 MG PO TABS Oral Take 0.25 mg by mouth 2 (two) times daily as needed. For anxiety    . ASPIRIN EC 81 MG PO TBEC Oral Take 1 tablet (81 mg total) by mouth daily. 150 tablet 2  . B COMPLEX PO TABS Oral Take 2 tablets by mouth daily.    Marland Kitchen VITAMIN D 1000 UNITS PO TABS Oral Take 1,000 Units by mouth daily.      . COQ10 50 MG PO CAPS Oral Take 1 capsule by mouth daily.      . FUROSEMIDE 20 MG PO TABS Oral Take 10 mg by mouth daily as needed. For fluid retention    . GABAPENTIN 100 MG PO CAPS Oral Take 1 capsule (100 mg total) by mouth 3 (three) times daily. 270 capsule 3  . HYDRALAZINE HCL 25 MG PO TABS Oral Take 1 tablet (25 mg total) by mouth 3 (three) times daily. 90 tablet 11  . HYDROCODONE-ACETAMINOPHEN 5-500 MG PO TABS Oral Take 1 tablet by mouth 2 (two) times daily as needed. For pain    . NEBIVOLOL HCL 5 MG PO TABS Oral Take 1 tablet (5 mg total) by mouth daily. 30 tablet 5  . NON FORMULARY Oral Take 1 tablet by mouth 4 (four) times daily. Sea calcium    . OLMESARTAN MEDOXOMIL 20 MG PO TABS Oral Take 1 tablet (20 mg total) by mouth daily. 30 tablet 11    . OMEPRAZOLE 20 MG PO CPDR Oral Take 20 mg by mouth daily as needed. For reflux    . ZOLPIDEM TARTRATE 10 MG PO TABS Oral Take 10 mg by mouth at bedtime as needed. For sleep      BP 137/60  Pulse 64  Temp(Src) 97.4 F (36.3 C) (Oral)  Resp 20  SpO2 97%  Physical Exam  Nursing note and vitals reviewed. Constitutional: She is oriented to person, place, and time. She  appears well-developed and well-nourished.  Non-toxic appearance. She does not have a sickly appearance.  HENT:  Head: Normocephalic and atraumatic.  Eyes: Conjunctivae, EOM and lids are normal. Pupils are equal, round, and reactive to light. No scleral icterus.  Neck: Trachea normal and normal range of motion. Neck supple.  Cardiovascular: Normal rate, regular rhythm and normal heart sounds.   Pulmonary/Chest: Effort normal and breath sounds normal.  Abdominal: Soft. Normal appearance. There is no tenderness. There is no rebound, no guarding and no CVA tenderness.  Musculoskeletal: Normal range of motion. She exhibits edema.       No focal C-spine, T-spine or L-spine tenderness on exam or step-offs noted on exam.  Patient has mild left wrist swelling with mild bruising present.  She has a palpable radial pulse.  Capillary refill in her fingertips is less than 2 seconds.  Sensation to light touch is intact in all of her fingertips.  She has no pain over her left clavicle left shoulder or left elbow and has full range of motion at those joints.  She does have snuffbox tenderness on examination of her wrist.  Neurological: She is alert and oriented to person, place, and time. She has normal strength.  Skin: Skin is warm, dry and intact. No rash noted.  Psychiatric: She has a normal mood and affect. Her behavior is normal. Judgment and thought content normal.    ED Course  Procedures (including critical care time)  Dg Wrist Complete Left  02/14/2011  *RADIOLOGY REPORT*  Clinical Data: Fall  LEFT WRIST - COMPLETE 3+ VIEW   Comparison: None  Findings: Mildly impacted fracture deformity of the distal radius is identified.  No significant angulation of the fracture fragments identified.  Degenerative changes are noted at the basilar joint.  There is extensive chondrocalcinosis noted within the wrist.  IMPRESSION:  1.  Slightly impacted fracture deformity involves the distal radius.  Per CMS PQRS reporting requirements (PQRS Measure 24): Given the patient's age of greater than 14 and the fracture site (hip, distal radius, or spine), the patient should be tested for osteoporosis using DXA, and the appropriate treatment considered based on the DXA results.  Original Report Authenticated By: Angelita Ingles, M.D.      MDM  Patient with left distal radius fracture.  Patient will be placed in a sugar tong splint with a sling and given followup with the hand specialist later this week.  Patient is neurovascularly intact at this time.  She'll be given a prescription for Norco to help control her pain at home.        Lezlie Octave, MD 02/14/11 [phone]

## 2011-02-14 NOTE — Progress Notes (Signed)
Orthopedic Tech Progress Note Patient Details:  Nicole English 12/21/1932 [id number]  Type of Splint: Sugartong;Other (comment) Splint Location: arm sling Splint Interventions: Application    Cammer, Theodoro Parma 02/14/2011, 11:18 AM

## 2011-02-14 NOTE — ED Notes (Signed)
Pt slipped on ice yesterday and fell complains of low back pain and pain and deformity to left wrist.

## 2011-02-18 DIAGNOSIS — S52599A Other fractures of lower end of unspecified radius, initial encounter for closed fracture: Secondary | ICD-10-CM | POA: Diagnosis not present

## 2011-02-23 ENCOUNTER — Ambulatory Visit: Payer: Medicare Other | Admitting: Cardiology

## 2011-03-05 ENCOUNTER — Other Ambulatory Visit: Payer: Self-pay | Admitting: Family Medicine

## 2011-03-05 DIAGNOSIS — Z1231 Encounter for screening mammogram for malignant neoplasm of breast: Secondary | ICD-10-CM

## 2011-03-08 DIAGNOSIS — S52599A Other fractures of lower end of unspecified radius, initial encounter for closed fracture: Secondary | ICD-10-CM | POA: Diagnosis not present

## 2011-03-17 ENCOUNTER — Ambulatory Visit (INDEPENDENT_AMBULATORY_CARE_PROVIDER_SITE_OTHER): Payer: Medicare Other | Admitting: Cardiology

## 2011-03-17 ENCOUNTER — Encounter: Payer: Self-pay | Admitting: Cardiology

## 2011-03-17 ENCOUNTER — Other Ambulatory Visit: Payer: Medicare Other

## 2011-03-17 VITALS — BP 140/73 | HR 65 | Ht 62.0 in | Wt 121.8 lb

## 2011-03-17 DIAGNOSIS — E785 Hyperlipidemia, unspecified: Secondary | ICD-10-CM

## 2011-03-17 DIAGNOSIS — I10 Essential (primary) hypertension: Secondary | ICD-10-CM

## 2011-03-17 NOTE — Progress Notes (Signed)
Nicole English Date of Birth: [birth year]   History of Present Illness: Nicole English is seen today for followup. She states she has been doing well. She still has some fluctuation in her blood pressure readings but it typically stays less than [id number] systolic. She slipped on the ice 4 weeks ago and fractured her left ulna. She states she will need to have surgery on her left thumb. She denies any dizziness or chest pain. She's had no shortness of breath.  Current Outpatient Prescriptions on File Prior to Visit  Medication Sig Dispense Refill  . ALPRAZolam (XANAX) 0.25 MG tablet Take 0.25 mg by mouth 2 (two) times daily as needed. For anxiety       . aspirin EC 81 MG tablet Take 1 tablet (81 mg total) by mouth daily.  150 tablet  2  . b complex vitamins tablet Take 2 tablets by mouth daily.      . cholecalciferol (VITAMIN D) 1000 UNITS tablet Take 1,000 Units by mouth daily.        . Coenzyme Q10 (COQ10) 50 MG CAPS Take 1 capsule by mouth daily.        . furosemide (LASIX) 20 MG tablet Take 10 mg by mouth as needed. For fluid retention      . gabapentin (NEURONTIN) 100 MG capsule Take 1 capsule (100 mg total) by mouth 3 (three) times daily.  270 capsule  3  . hydrALAZINE (APRESOLINE) 25 MG tablet Take 1 tablet (25 mg total) by mouth 3 (three) times daily.  90 tablet  11  . HYDROcodone-acetaminophen (VICODIN) 5-500 MG per tablet Take 1 tablet by mouth 2 (two) times daily as needed. For pain       . nebivolol (BYSTOLIC) 5 MG tablet Take 1 tablet (5 mg total) by mouth daily.  30 tablet  5  . NON FORMULARY Take 1 tablet by mouth 4 (four) times daily. Sea calcium      . olmesartan (BENICAR) 20 MG tablet Take 1 tablet (20 mg total) by mouth daily.  30 tablet  11  . omeprazole (PRILOSEC) 20 MG capsule Take 20 mg by mouth daily as needed. For reflux      . zolpidem (AMBIEN) 10 MG tablet Take 10 mg by mouth at bedtime as needed. For sleep       . HYDROcodone-acetaminophen (NORCO) 5-325 MG per tablet Take  1 tablet by mouth every 4 (four) hours as needed for pain.  20 tablet  0    Allergies  Allergen Reactions  . Doxycycline Monohydrate     REACTION: Vomitting  . Ramipril     REACTION: worsening renal function    Past Medical History  Diagnosis Date  . History of diverticulitis of colon   . HLD (hyperlipidemia)   . Anxiety   . OP (osteoporosis)   . Depression   . GERD (gastroesophageal reflux disease)   . IBS (irritable bowel syndrome)   . Glucose intolerance (impaired glucose tolerance)   . Allergic rhinitis   . Migraine   . HTN (hypertension)     Dr. Martinique, cards  . SUI (stress urinary incontinence, female)     Dr. Amalia Hailey, urology (some urge as well)  . Diverticulosis     colonsocopy 2002 aborted 2/2 tortuous colon and heavy sigmoid diverticulosis  . Dysphagia     EGD 2012 - wnl, s/p esoph dilation  . CKD (chronic kidney disease) stage 3, GFR 30-59 ml/min     Past Surgical History  Procedure Date  . Tonsillectomy 1942  . Submucous resection 1962  . Tubal ligation 1973  . Breast enhancement surgery 1982  . Total abdominal hysterectomy 1996    for frequent dysmennorhea  . Macroplastique implantation 03/2008    Dr. Rogers Blocker  . Uretherolysis and removal of macroplastique 09/13/08    Dr. Amalia Hailey  . Ct head limited w/o cm 04/2010    nothing acute, ? old infarcts L internal capsule  . Renal doppler 2009?    simple cysts, wnl  . Colonoscopy 2002    does not want another!  . Esophagogastroduodenoscopy 2012    WNL, s/p esoph dilation (Dr. Candace Cruise, [facility])  . US echocardiography 11/2010    EF 55-60%, mild LAE, mild diastolic dysfunction, normal valves    History  Smoking status  . Former Smoker  Smokeless tobacco  . Never Used    History  Alcohol Use  . Yes    1 1/2-2 glasses white wine/day    Family History  Problem Relation Age of Onset  . Heart failure Father   . Heart disease Father   . Cancer Father     prostate  . Hypertension Father   . Coronary artery  disease Father   . Leukemia Mother     CLL prior    Review of Systems: As noted in history of present illness.  All other systems were reviewed and are negative.  Physical Exam: BP 140/73  Pulse 65  Ht 5\' 2"  (1.575 m)  Wt 121 lb 12.8 oz (55.248 kg)  BMI 22.28 kg/m2 She is a pleasant elderly white female in no acute distress. She is normocephalic, atraumatic. Pupils are equal round and reactive to light accommodation. Extraocular movements are full. Oropharynx is clear. Neck is supple without JVD, adenopathy, thyromegaly, or bruits. Lungs are clear. Cardiac exam reveals a regular rate and rhythm without gallop, murmur, or click. Abdomen is soft and nontender. She has no masses or bruits. Extremities are without edema. Her left forearm is in a soft brace. Pedal pulses are good. Her neurologic exam is nonfocal. LABORATORY DATA:   Assessment / Plan:

## 2011-03-17 NOTE — Patient Instructions (Signed)
Continue your current medication.  I will see you again in 6 months.   

## 2011-03-17 NOTE — Assessment & Plan Note (Signed)
I think her blood pressure is doing reasonably well. She is tolerating her medications well which has been an issue in the past. She is on multiple drugs. We reviewed her lab work from November. I will plan a followup again in 6 months. I have made no changes in her therapy today.

## 2011-04-05 DIAGNOSIS — S52599A Other fractures of lower end of unspecified radius, initial encounter for closed fracture: Secondary | ICD-10-CM | POA: Diagnosis not present

## 2011-04-15 ENCOUNTER — Ambulatory Visit (HOSPITAL_COMMUNITY)
Admission: RE | Admit: 2011-04-15 | Discharge: 2011-04-15 | Disposition: A | Discharge status: Home / Self Care | Payer: Medicare Other | Source: Ambulatory Visit | Attending: Family Medicine | Admitting: Family Medicine

## 2011-04-15 DIAGNOSIS — Z1231 Encounter for screening mammogram for malignant neoplasm of breast: Secondary | ICD-10-CM | POA: Insufficient documentation

## 2011-04-19 ENCOUNTER — Encounter: Payer: Self-pay | Admitting: *Deleted

## 2011-05-17 DIAGNOSIS — S52599A Other fractures of lower end of unspecified radius, initial encounter for closed fracture: Secondary | ICD-10-CM | POA: Diagnosis not present

## 2011-05-17 DIAGNOSIS — M19049 Primary osteoarthritis, unspecified hand: Secondary | ICD-10-CM | POA: Diagnosis not present

## 2011-06-07 ENCOUNTER — Telehealth: Payer: Self-pay | Admitting: Cardiology

## 2011-06-07 NOTE — Telephone Encounter (Signed)
New problem:  Patient calling C/O chest pain.

## 2011-06-07 NOTE — Telephone Encounter (Signed)
Discussed pt issues with lori gerhardt np, pt is due to take her benicar at 4pm today. Pt instructed to go ahead and take benicar now. She will check her bp again around 4 pm and if cont to be elevated she will take another benicar at that time. She will track her bp daily and bring those to the appt she was made with lori gerhardt np for Friday this week.

## 2011-06-07 NOTE — Telephone Encounter (Signed)
Spoke with pt, for the last couple days she has not felt well. This morning she is having SOB with little exertion and she feels congested. Her bp is 181/117. She reports her head feels funny, the back of her neck feels tight. She denies chest pain or palpitations, states she feels tight. She feels her throat is congested and she is having to clear her throat a lot. Will discuss with lori gerhardt np.

## 2011-06-11 ENCOUNTER — Ambulatory Visit (INDEPENDENT_AMBULATORY_CARE_PROVIDER_SITE_OTHER): Payer: Medicare Other | Admitting: Nurse Practitioner

## 2011-06-11 ENCOUNTER — Encounter: Payer: Self-pay | Admitting: Nurse Practitioner

## 2011-06-11 VITALS — BP 146/80 | HR 63 | Ht 62.0 in | Wt 126.0 lb

## 2011-06-11 DIAGNOSIS — I10 Essential (primary) hypertension: Secondary | ICD-10-CM | POA: Diagnosis not present

## 2011-06-11 DIAGNOSIS — N183 Chronic kidney disease, stage 3 unspecified: Secondary | ICD-10-CM

## 2011-06-11 LAB — BASIC METABOLIC PANEL WITH GFR
BUN: 21 mg/dL (ref 6–23)
CO2: 24 meq/L (ref 19–32)
Calcium: 9.5 mg/dL (ref 8.4–10.5)
Chloride: 105 meq/L (ref 96–112)
Creatinine, Ser: 1.9 mg/dL — ABNORMAL HIGH (ref 0.4–1.2)
GFR: 27.23 mL/min — ABNORMAL LOW
Glucose, Bld: 100 mg/dL — ABNORMAL HIGH (ref 70–99)
Potassium: 6 meq/L — ABNORMAL HIGH (ref 3.5–5.1)
Sodium: 137 meq/L (ref 135–145)

## 2011-06-11 MED ORDER — OLMESARTAN MEDOXOMIL 20 MG PO TABS: 20.0000 mg | ORAL_TABLET | Freq: Two times a day (BID) | ORAL | Status: DC

## 2011-06-11 NOTE — Progress Notes (Signed)
[name] Date of Birth: 02-04-1932 Medical Record [id number]  History of Present Illness: Nicole English is seen today for a work in visit. She is seen for Dr. Martinique. She is a very pleasant 78 year old female with no known CAD. Does have HTN with diastolic dysfunction. Other problems are as listed below.   She comes in today. She is here alone. She had called earlier this week. Blood pressure was up. She had been feeling ok but had had a stressful weekend. Slid down an embankment at her grandchild's soccer field. No injury but has been having chronic orthopedic issues. No NSAID use. Not exercising. She started feeling a little congested and a little short of breath. Took her blood pressure and it was elevated at 181/117. We increased her Benicar to BID.   She is now feeling better. Blood pressure is trending back down. She has no more symptoms. She does report eating "lots of bananas". We will need to recheck labs today.   Current Outpatient Prescriptions on File Prior to Visit  Medication Sig Dispense Refill  . ALPRAZolam (XANAX) 0.25 MG tablet Take 0.25 mg by mouth 2 (two) times daily as needed. For anxiety       . aspirin EC 81 MG tablet Take 1 tablet (81 mg total) by mouth daily.  150 tablet  2  . b complex vitamins tablet Take 2 tablets by mouth daily.      . cholecalciferol (VITAMIN D) 1000 UNITS tablet Take 1,000 Units by mouth daily.        . Coenzyme Q10 (COQ10) 50 MG CAPS Take 1 capsule by mouth daily.        . furosemide (LASIX) 20 MG tablet Take 10 mg by mouth as needed. For fluid retention      . gabapentin (NEURONTIN) 100 MG capsule Take 1 capsule (100 mg total) by mouth 3 (three) times daily.  270 capsule  3  . hydrALAZINE (APRESOLINE) 25 MG tablet Take 1 tablet (25 mg total) by mouth 3 (three) times daily.  90 tablet  11  . HYDROcodone-acetaminophen (VICODIN) 5-500 MG per tablet Take 1 tablet by mouth 2 (two) times daily as needed. For pain       . nebivolol (BYSTOLIC) 5 MG  tablet Take 1 tablet (5 mg total) by mouth daily.  30 tablet  5  . NON FORMULARY Take 1 tablet by mouth 4 (four) times daily. Sea calcium      . omeprazole (PRILOSEC) 20 MG capsule Take 20 mg by mouth daily as needed. For reflux      . Oyster Shell (OYSTER CALCIUM) 500 MG TABS Take 500 mg of elemental calcium by mouth 2 (two) times daily.      Marland Kitchen zolpidem (AMBIEN) 10 MG tablet Take 10 mg by mouth at bedtime as needed. For sleep       . DISCONTD: olmesartan (BENICAR) 20 MG tablet Take 1 tablet (20 mg total) by mouth daily.  30 tablet  11  . HYDROcodone-acetaminophen (NORCO) 5-325 MG per tablet Take 1 tablet by mouth every 4 (four) hours as needed for pain.  20 tablet  0    Allergies  Allergen Reactions  . Doxycycline Monohydrate     REACTION: Vomitting  . Ramipril     REACTION: worsening renal function    Past Medical History  Diagnosis Date  . History of diverticulitis of colon   . HLD (hyperlipidemia)   . Anxiety   . OP (osteoporosis)   .  Depression   . GERD (gastroesophageal reflux disease)   . IBS (irritable bowel syndrome)   . Glucose intolerance (impaired glucose tolerance)   . Allergic rhinitis   . Migraine   . HTN (hypertension)     Dr. Martinique, cards  . SUI (stress urinary incontinence, female)     Dr. Amalia Hailey, urology (some urge as well)  . Diverticulosis     colonsocopy 2002 aborted 2/2 tortuous colon and heavy sigmoid diverticulosis  . Dysphagia     EGD 2012 - wnl, s/p esoph dilation  . CKD (chronic kidney disease) stage 3, GFR 30-59 ml/min   . Diastolic dysfunction     per echo in November 2012; normal EF    Past Surgical History  Procedure Date  . Tonsillectomy 1942  . Submucous resection 1962  . Tubal ligation 1973  . Breast enhancement surgery 1982  . Total abdominal hysterectomy 1996    for frequent dysmennorhea  . Macroplastique implantation 03/2008    Dr. Rogers Blocker  . Uretherolysis and removal of macroplastique 09/13/08    Dr. Amalia Hailey  . Ct head limited w/o  cm 04/2010    nothing acute, ? old infarcts L internal capsule  . Renal doppler 2009?    simple cysts, wnl  . Colonoscopy 2002    does not want another!  . Esophagogastroduodenoscopy 2012    WNL, s/p esoph dilation (Dr. Candace Cruise, [facility])  . US echocardiography 11/2010    EF 55-60%, mild LAE, mild diastolic dysfunction, normal valves    History  Smoking status  . Former Smoker  Smokeless tobacco  . Never Used    History  Alcohol Use  . Yes    1 1/2-2 glasses white wine/day    Family History  Problem Relation Age of Onset  . Heart failure Father   . Heart disease Father   . Cancer Father     prostate  . Hypertension Father   . Coronary artery disease Father   . Leukemia Mother     CLL prior    Review of Systems: The review of systems is per the HPI.  All other systems were reviewed and are negative.  Physical Exam: BP 146/80  Pulse 63  Ht 5\' 2"  (1.575 m)  Wt 126 lb (57.153 kg)  BMI 23.05 kg/m2 Patient is very pleasant and in no acute distress. Skin is warm and dry. Color is normal.  HEENT is unremarkable. Normocephalic/atraumatic. PERRL. Sclera are nonicteric. Neck is supple. No masses. No JVD. Lungs are clear. Cardiac exam shows a regular rate and rhythm. Abdomen is soft. Extremities are without edema. Gait and ROM are intact. No gross neurologic deficits noted.   LABORATORY DATA:  Echo Study Conclusions from November 2012  - Left ventricle: The cavity size was normal. Wall thickness was normal. Systolic function was normal. The estimated ejection fraction was in the range of 55% to 60%. Wall motion was normal; there were no regional wall motion abnormalities. Doppler parameters are consistent with high ventricular filling pressure. - Mitral valve: Calcified annulus. - Left atrium: The atrium was mildly dilated.    Assessment / Plan:

## 2011-06-11 NOTE — Assessment & Plan Note (Signed)
Her blood pressure is trending back down. We will need to recheck a potassium and BUN/creatine today. May need to have her cut back her intake of oral potassium. She will continue to monitor her blood pressure at home. We do have other options if needed for anti hypertensive therapy. I will see her back in one month. Patient is agreeable to this plan and will call if any problems develop in the interim.

## 2011-06-11 NOTE — Patient Instructions (Signed)
Stay on your current medicines  I will see you in a month  Monitor your blood pressure at home and keep a diary  We are going to check your potassium level today  Call the [place] office at [phone] if you have any questions, problems or concerns.

## 2011-06-11 NOTE — Assessment & Plan Note (Signed)
She is on higher doses of Benicar now. We will need to repeat her labs today.

## 2011-06-15 ENCOUNTER — Telehealth: Payer: Self-pay | Admitting: Cardiology

## 2011-06-15 ENCOUNTER — Telehealth: Payer: Self-pay | Admitting: *Deleted

## 2011-06-15 NOTE — Telephone Encounter (Signed)
New Problem:    Patient called wanting to know if she would still be able to eat avocados with the medications she is currently taking. Please call back.

## 2011-06-15 NOTE — Telephone Encounter (Signed)
Patient called no answer.

## 2011-06-15 NOTE — Telephone Encounter (Signed)
Left message to call back  

## 2011-06-15 NOTE — Telephone Encounter (Signed)
Message copied by Earvin Hansen on Tue Jun 15, 2011  2:14 PM ------      Message from: Burtis Junes      Created: Fri Jun 11, 2011  4:21 PM       Please call. Needs to cut out all of her bananas. Cut the Benicar back to just one a day. Increase the Bystolic to 10 mg. Monitor blood pressure. I think I am seeing her back. She will need to have BMET on Tuesday rechecked.

## 2011-06-15 NOTE — Telephone Encounter (Signed)
Message copied by Earvin Hansen on Tue Jun 15, 2011 11:48 AM ------      Message from: Burtis Junes      Created: Fri Jun 11, 2011  4:21 PM       Please call. Needs to cut out all of her bananas. Cut the Benicar back to just one a day. Increase the Bystolic to 10 mg. Monitor blood pressure. I think I am seeing her back. She will need to have BMET on Tuesday rechecked.

## 2011-06-15 NOTE — Telephone Encounter (Signed)
Advised patient of labs and medication changes

## 2011-06-16 ENCOUNTER — Telehealth: Payer: Self-pay | Admitting: Nurse Practitioner

## 2011-06-16 MED ORDER — NEBIVOLOL HCL 5 MG PO TABS: 10.0000 mg | ORAL_TABLET | Freq: Every day | ORAL | Status: DC

## 2011-06-16 NOTE — Telephone Encounter (Signed)
PT NEEDS DOUBLE RX FOR BYSTOLIC TO BE CALLED IN TO [facility] [place], THE DOUBLE BENACAR NEEDS TO BE CANCELLED [place] AFTER CALLED IN DUE TO BLOOD WORK

## 2011-06-16 NOTE — Telephone Encounter (Signed)
Patient called stated she is confused on what to eat.States she was told not to eat bananas.States was wanting to know if avocados are ok, but looked them up on internet last night and read they are higher in potassium.Patient was told try not to worry just dont eat bananas or avocados until she sees Truitt Merle NP back in office,and then she can discuss her diet.

## 2011-06-18 ENCOUNTER — Other Ambulatory Visit: Payer: Self-pay | Admitting: Family Medicine

## 2011-06-18 NOTE — Telephone Encounter (Signed)
[name]: Nicole English/Patient; Phone Number: [phone]; Message from caller: Patient calling to verify fax was received for refill on Xanax from [place] and states she needs medication today.

## 2011-06-18 NOTE — Telephone Encounter (Signed)
Ok to refill 

## 2011-06-18 NOTE — Telephone Encounter (Signed)
Rx called in as directed.   

## 2011-07-14 ENCOUNTER — Ambulatory Visit (INDEPENDENT_AMBULATORY_CARE_PROVIDER_SITE_OTHER): Payer: Medicare Other | Admitting: Nurse Practitioner

## 2011-07-14 ENCOUNTER — Encounter: Payer: Self-pay | Admitting: Nurse Practitioner

## 2011-07-14 VITALS — BP 100/60 | HR 61 | Ht 62.0 in | Wt 123.0 lb

## 2011-07-14 DIAGNOSIS — I10 Essential (primary) hypertension: Secondary | ICD-10-CM | POA: Diagnosis not present

## 2011-07-14 LAB — BASIC METABOLIC PANEL
BUN: 46 mg/dL — ABNORMAL HIGH (ref 6–23)
CO2: 23 mEq/L (ref 19–32)
Calcium: 9.3 mg/dL (ref 8.4–10.5)
Chloride: 105 mEq/L (ref 96–112)
Creatinine, Ser: 1.8 mg/dL — ABNORMAL HIGH (ref 0.4–1.2)
GFR: 28.79 mL/min — ABNORMAL LOW (ref >60.00)
Glucose, Bld: 97 mg/dL (ref 70–99)
Potassium: 5.5 mEq/L — ABNORMAL HIGH (ref 3.5–5.1)
Sodium: 135 mEq/L (ref 135–145)

## 2011-07-14 NOTE — Patient Instructions (Addendum)
We are going to check your lab today  I will see you in 6 weeks. Bring your blood pressure cuff at your next visit for [place] to check.  Call the [facility] office at [phone] if you have any questions, problems or concerns.

## 2011-07-14 NOTE — Assessment & Plan Note (Signed)
Her blood pressure is quite low here today. She does have some lability at home which she feels is more related to stress which is not going to get better until later this summer. I do not think she needs more medicine at this point. We are going to recheck her BMET today. Potassium was quite high. She needs to get her BP cuff checked to see if it correlates. We will see her back in about 6 weeks. She will continue to monitor at home but will also bring her cuff in at her next visit. No change in medicines today. Patient is agreeable to this plan and will call if any problems develop in the interim.

## 2011-07-14 NOTE — Progress Notes (Signed)
[name] Date of Birth: 08-Apr-1932 Medical Record [id number]  History of Present Illness: Nicole English is seen back today for a one month check. She is seen for Dr. Martinique. She has no known CAD. Reports negative stress test last year at [facility]. Does have HTN with diastolic dysfunction, HLD, anxiety, depresion, SUI, diverticulosis and CKD.   She comes in today. She is here with her granddaughter. She hasn't really been feeling all that well. No chest pain. Gets weak at times. Some headaches. Still not exercising. Stayed in bed all day yesterday due to "just not feeling good" and read a book all day. Blood pressure seems to be up and down and she feels it is related to her stress level. She will use her Xanax prn. 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 At her last visit, we were going to increase her ARB but her potassium was quite elevated and so we increased her Bystolic instead. She has never had her blood pressure cuff checked for correlation.   Current Outpatient Prescriptions on File Prior to Visit  Medication Sig Dispense Refill  . ALPRAZolam (XANAX) 0.25 MG tablet TAKE ONE TABLET BY MOUTH TWICE DAILY AS NEEDED FOR SLEEP OR ANXIETY  60 tablet  0  . aspirin EC 81 MG tablet Take 1 tablet (81 mg total) by mouth daily.  150 tablet  2  . b complex vitamins tablet Take 2 tablets by mouth daily.      . cholecalciferol (VITAMIN D) 1000 UNITS tablet Take 1,000 Units by mouth daily.        . Coenzyme Q10 (COQ10) 50 MG CAPS Take 1 capsule by mouth daily.        . furosemide (LASIX) 20 MG tablet Take 10 mg by mouth as needed. For fluid retention      . gabapentin (NEURONTIN) 100 MG capsule Take 1 capsule (100 mg total) by mouth 3 (three) times daily.  270 capsule  3  . hydrALAZINE (APRESOLINE) 25 MG tablet Take 1 tablet (25 mg total) by mouth 3 (three) times daily.  90 tablet  11  . HYDROcodone-acetaminophen (VICODIN) 5-500 MG per tablet Take 1 tablet by mouth 2  (two) times daily as needed. For pain       . nebivolol (BYSTOLIC) 5 MG tablet Take 2 tablets (10 mg total) by mouth daily. 2 daily  60 tablet  3  . NON FORMULARY Take 1 tablet by mouth 4 (four) times daily. Sea calcium      . olmesartan (BENICAR) 20 MG tablet Take 20 mg by mouth daily.      Marland Kitchen omeprazole (PRILOSEC) 20 MG capsule Take 20 mg by mouth daily as needed. For reflux      . Oyster Shell (OYSTER CALCIUM) 500 MG TABS Take 500 mg of elemental calcium by mouth 2 (two) times daily.      Marland Kitchen zolpidem (AMBIEN) 10 MG tablet Take 10 mg by mouth at bedtime as needed. For sleep       . HYDROcodone-acetaminophen (NORCO) 5-325 MG per tablet Take 1 tablet by mouth every 4 (four) hours as needed for pain.  20 tablet  0    Allergies  Allergen Reactions  . Doxycycline Monohydrate     REACTION: Vomitting  . Ramipril     REACTION: worsening renal function    Past Medical History  Diagnosis Date  . History of diverticulitis of colon   . HLD (hyperlipidemia)   .  Anxiety   . OP (osteoporosis)   . Depression   . GERD (gastroesophageal reflux disease)   . IBS (irritable bowel syndrome)   . Glucose intolerance (impaired glucose tolerance)   . Allergic rhinitis   . Migraine   . HTN (hypertension)     Dr. Martinique, cards  . SUI (stress urinary incontinence, female)     Dr. Amalia Hailey, urology (some urge as well)  . Diverticulosis     colonsocopy 2002 aborted 2/2 tortuous colon and heavy sigmoid diverticulosis  . Dysphagia     EGD 2012 - wnl, s/p esoph dilation  . CKD (chronic kidney disease) stage 3, GFR 30-59 ml/min   . Diastolic dysfunction     per echo in November 2012; normal EF    Past Surgical History  Procedure Date  . Tonsillectomy 1942  . Submucous resection 1962  . Tubal ligation 1973  . Breast enhancement surgery 1982  . Total abdominal hysterectomy 1996    for frequent dysmennorhea  . Macroplastique implantation 03/2008    Dr. Rogers Blocker  . Uretherolysis and removal of macroplastique  09/13/08    Dr. Amalia Hailey  . Ct head limited w/o cm 04/2010    nothing acute, ? old infarcts L internal capsule  . Renal doppler 2009?    simple cysts, wnl  . Colonoscopy 2002    does not want another!  . Esophagogastroduodenoscopy 2012    WNL, s/p esoph dilation (Dr. Candace Cruise, [facility])  . US echocardiography 11/2010    EF 55-60%, mild LAE, mild diastolic dysfunction, normal valves    History  Smoking status  . Former Smoker  Smokeless tobacco  . Never Used    History  Alcohol Use  . Yes    1 1/2-2 glasses white wine/day    Family History  Problem Relation Age of Onset  . Heart failure Father   . Heart disease Father   . Cancer Father     prostate  . Hypertension Father   . Coronary artery disease Father   . Leukemia Mother     CLL prior    Review of Systems: The review of systems is per the HPI.  All other systems were reviewed and are negative.  Physical Exam: BP 100/60  Pulse 61  Ht 5\' 2"  (1.575 m)  Wt 123 lb (55.792 kg)  BMI 22.50 kg/m2  SpO2 94% Blood pressure by me is also 100/60. Patient is very pleasant and in no acute distress. She seems a little depressed to me. Skin is warm and dry. Color is normal.  HEENT is unremarkable. Normocephalic/atraumatic. PERRL. Sclera are nonicteric. Neck is supple. No masses. No JVD. Lungs are clear. Cardiac exam shows a regular rate and rhythm. Abdomen is soft. Extremities are without edema. Gait and ROM are intact. No gross neurologic deficits noted.  LABORATORY DATA: Repeat BMET is pending.   Lab Results  Component Value Date   WBC 6.6 12/03/2010   HGB 11.1* 12/03/2010   HCT 33.2* 12/03/2010   PLT 262.0 12/03/2010   GLUCOSE 100* 06/11/2011   CHOL 185 02/16/2010   TRIG 56 02/16/2010   HDL 79 02/16/2010   LDLDIRECT 103.5 12/03/2010   LDLCALC 95 02/16/2010   ALT 15 02/16/2010   AST 18 02/16/2010   NA 137 06/11/2011   K 6.0* 06/11/2011   CL 105 06/11/2011   CREATININE 1.9* 06/11/2011   BUN 21 06/11/2011   CO2 24 06/11/2011   TSH  2.94 12/03/2010   HGBA1C 5.6 10/02/2008   MICROALBUR  13.4* 08/19/2008     Assessment / Plan:

## 2011-07-15 ENCOUNTER — Other Ambulatory Visit: Payer: Self-pay

## 2011-07-15 DIAGNOSIS — I10 Essential (primary) hypertension: Secondary | ICD-10-CM

## 2011-07-18 ENCOUNTER — Other Ambulatory Visit: Payer: Self-pay | Admitting: Family Medicine

## 2011-07-19 NOTE — Telephone Encounter (Signed)
Rx called in as directed.   

## 2011-07-19 NOTE — Telephone Encounter (Signed)
plz phone in. 

## 2011-07-23 ENCOUNTER — Ambulatory Visit (INDEPENDENT_AMBULATORY_CARE_PROVIDER_SITE_OTHER): Payer: Medicare Other | Admitting: *Deleted

## 2011-07-23 ENCOUNTER — Other Ambulatory Visit (INDEPENDENT_AMBULATORY_CARE_PROVIDER_SITE_OTHER): Payer: Medicare Other

## 2011-07-23 VITALS — Wt 122.0 lb

## 2011-07-23 DIAGNOSIS — I10 Essential (primary) hypertension: Secondary | ICD-10-CM

## 2011-07-23 LAB — BASIC METABOLIC PANEL
BUN: 31 mg/dL — ABNORMAL HIGH (ref 6–23)
CO2: 22 mEq/L (ref 19–32)
Calcium: 9.1 mg/dL (ref 8.4–10.5)
Chloride: 101 mEq/L (ref 96–112)
Creatinine, Ser: 1.8 mg/dL — ABNORMAL HIGH (ref 0.4–1.2)
GFR: 29.54 mL/min — ABNORMAL LOW (ref >60.00)
Glucose, Bld: 97 mg/dL (ref 70–99)
Potassium: 4.9 mEq/L (ref 3.5–5.1)
Sodium: 130 mEq/L — ABNORMAL LOW (ref 135–145)

## 2011-07-23 NOTE — Progress Notes (Signed)
Pt here for blood pressure check. She brought home blood pressure cuff for comparison.  She has stopped Benicar. Felt nauseated for 3 days after stopping but nausea has improved.  Blood pressures as follows' Manual--Left arm-136/70, heart rate 52             Right arm--114/70.  Pt's home cuff-- Left arm-126/83, heart rate 49                            Right arm--127/74  Reviewed with Dr. Martinique. Pt will stay off Benicar and we will call her with results of labs done today.

## 2011-07-27 ENCOUNTER — Telehealth: Payer: Self-pay | Admitting: Cardiology

## 2011-07-27 NOTE — Telephone Encounter (Signed)
Patient called was given lab results. Advised Truitt Merle NP recommends renal evaluation.Patient stated she did not think that was necessary she had renal evaluation a few years ago.Also stated since she stopped her benicar it is like she has been detoxing.States mouth,tongue and lips tingling and feels like tongue is on fire.States has been weak,nausea and disoriented.Patient told will let Truitt Merle NP know and call her back.

## 2011-07-27 NOTE — Telephone Encounter (Signed)
New problem:  Patient calling regarding test results.

## 2011-07-29 NOTE — Telephone Encounter (Signed)
Patient called was told spoke with Dr.Jordan he advises may hold off on a renal evaluation.Patient also was asking how should she be taking bystolic.Dr.Jordan advised take 5 mg twice daily.

## 2011-08-07 ENCOUNTER — Other Ambulatory Visit: Payer: Self-pay | Admitting: Family Medicine

## 2011-08-08 NOTE — Telephone Encounter (Signed)
plz phone in. 

## 2011-08-09 NOTE — Telephone Encounter (Signed)
Rx called in as directed.   

## 2011-08-16 ENCOUNTER — Other Ambulatory Visit: Payer: Self-pay | Admitting: Family Medicine

## 2011-08-16 NOTE — Telephone Encounter (Signed)
plz phone in. 

## 2011-08-17 ENCOUNTER — Other Ambulatory Visit: Payer: Self-pay | Admitting: Family Medicine

## 2011-08-17 NOTE — Telephone Encounter (Signed)
Rx called in as directed.   

## 2011-08-23 ENCOUNTER — Other Ambulatory Visit: Payer: Self-pay | Admitting: Nurse Practitioner

## 2011-08-23 ENCOUNTER — Encounter: Payer: Self-pay | Admitting: Nurse Practitioner

## 2011-08-23 ENCOUNTER — Ambulatory Visit (INDEPENDENT_AMBULATORY_CARE_PROVIDER_SITE_OTHER): Payer: Medicare Other | Admitting: Nurse Practitioner

## 2011-08-23 VITALS — BP 152/70 | HR 64 | Ht 62.0 in | Wt 125.0 lb

## 2011-08-23 DIAGNOSIS — N183 Chronic kidney disease, stage 3 unspecified: Secondary | ICD-10-CM | POA: Diagnosis not present

## 2011-08-23 DIAGNOSIS — I10 Essential (primary) hypertension: Secondary | ICD-10-CM

## 2011-08-23 MED ORDER — HYDRALAZINE HCL 50 MG PO TABS: 50.0000 mg | ORAL_TABLET | Freq: Three times a day (TID) | ORAL | Status: DC

## 2011-08-23 NOTE — Progress Notes (Signed)
[name] Date of Birth: October 04, 1932 Medical Record [id number]  History of Present Illness: Nicole English is seen today for a follow up visit. She is seen for Dr. Martinique. She has no known CAD with negative stress test last year at [facility]. Does have HTN with diastolic dysfunction, HLD, anxiety, depression, diverticulosis and CKD. She has been intolerant to ACE/ARB due to her renal function and HTN.  She comes in today. She is not having chest pain. Not short of breath. Still feels tired and weak. Remains pretty stressed out with her job and has a wedding coming up that is making her anxious. She is not exercising. Says she needs some guidelines. Blood pressures from home are high.   Current Outpatient Prescriptions on File Prior to Visit  Medication Sig Dispense Refill  . ALPRAZolam (XANAX) 0.25 MG tablet TAKE ONE TABLET BY MOUTH TWICE DAILY AS NEEDED FOR SLEEP AND FOR ANXIETY  60 tablet  0  . aspirin EC 81 MG tablet Take 1 tablet (81 mg total) by mouth daily.  150 tablet  2  . b complex vitamins tablet Take 2 tablets by mouth daily.      . cholecalciferol (VITAMIN D) 1000 UNITS tablet Take 1,000 Units by mouth daily.        . Coenzyme Q10 (COQ10) 50 MG CAPS Take 1 capsule by mouth daily.        . furosemide (LASIX) 20 MG tablet Take 10 mg by mouth as needed. For fluid retention      . gabapentin (NEURONTIN) 100 MG capsule Take 1 capsule (100 mg total) by mouth 3 (three) times daily.  270 capsule  3  . HYDROcodone-acetaminophen (VICODIN) 5-500 MG per tablet TAKE ONE TABLET BY MOUTH TWICE DAILY AS NEEDED FOR PAIN  60 tablet  0  . NON FORMULARY Take 1 tablet by mouth 4 (four) times daily. Sea calcium      . omeprazole (PRILOSEC) 20 MG capsule Take 20 mg by mouth daily as needed. For reflux      . zolpidem (AMBIEN) 10 MG tablet Take 10 mg by mouth at bedtime as needed. For sleep       . DISCONTD: nebivolol (BYSTOLIC) 5 MG tablet Take 2 tablets (10 mg total) by mouth daily. 2 daily  60 tablet  3      Allergies  Allergen Reactions  . Doxycycline Monohydrate     REACTION: Vomitting  . Ramipril     REACTION: worsening renal function    Past Medical History  Diagnosis Date  . History of diverticulitis of colon   . HLD (hyperlipidemia)   . Anxiety   . OP (osteoporosis)   . Depression   . GERD (gastroesophageal reflux disease)   . IBS (irritable bowel syndrome)   . Glucose intolerance (impaired glucose tolerance)   . Allergic rhinitis   . Migraine   . HTN (hypertension)     Dr. Martinique, cards  . SUI (stress urinary incontinence, female)     Dr. Amalia Hailey, urology (some urge as well)  . Diverticulosis     colonsocopy 2002 aborted 2/2 tortuous colon and heavy sigmoid diverticulosis  . Dysphagia     EGD 2012 - wnl, s/p esoph dilation  . CKD (chronic kidney disease) stage 3, GFR 30-59 ml/min   . Diastolic dysfunction     per echo in November 2012; normal EF    Past Surgical History  Procedure Date  . Tonsillectomy 1942  . Submucous resection 1962  . Tubal  ligation 1973  . Breast enhancement surgery 1982  . Total abdominal hysterectomy 1996    for frequent dysmennorhea  . Macroplastique implantation 03/2008    Dr. Rogers Blocker  . Uretherolysis and removal of macroplastique 09/13/08    Dr. Amalia Hailey  . Ct head limited w/o cm 04/2010    nothing acute, ? old infarcts L internal capsule  . Renal doppler 2009?    simple cysts, wnl  . Colonoscopy 2002    does not want another!  . Esophagogastroduodenoscopy 2012    WNL, s/p esoph dilation (Dr. Candace Cruise, [facility])  . US echocardiography 11/2010    EF 55-60%, mild LAE, mild diastolic dysfunction, normal valves    History  Smoking status  . Former Smoker  Smokeless tobacco  . Never Used    History  Alcohol Use  . Yes    1 1/2-2 glasses white wine/day    Family History  Problem Relation Age of Onset  . Heart failure Father   . Heart disease Father   . Cancer Father     prostate  . Hypertension Father   . Coronary artery  disease Father   . Leukemia Mother     CLL prior    Review of Systems: The review of systems is per the HPI.  All other systems were reviewed and are negative.  Physical Exam: BP 152/70  Pulse 64  Ht 5\' 2"  (1.575 m)  Wt 125 lb (56.7 kg)  BMI 22.86 kg/m2 Patient is very pleasant and in no acute distress. Skin is warm and dry. Color is normal.  HEENT is unremarkable. Normocephalic/atraumatic. PERRL. Sclera are nonicteric. Neck is supple. No masses. No JVD. Lungs are clear. Cardiac exam shows a regular rate and rhythm. Abdomen is soft. Extremities are without edema. Gait and ROM are intact. No gross neurologic deficits noted.   LABORATORY DATA: Lab Results  Component Value Date   WBC 6.6 12/03/2010   HGB 11.1* 12/03/2010   HCT 33.2* 12/03/2010   PLT 262.0 12/03/2010   GLUCOSE 97 07/23/2011   CHOL 185 02/16/2010   TRIG 56 02/16/2010   HDL 79 02/16/2010   LDLDIRECT 103.5 12/03/2010   LDLCALC 95 02/16/2010   ALT 15 02/16/2010   AST 18 02/16/2010   NA 130* 07/23/2011   K 4.9 07/23/2011   CL 101 07/23/2011   CREATININE 1.8* 07/23/2011   BUN 31* 07/23/2011   CO2 22 07/23/2011   TSH 2.94 12/03/2010   HGBA1C 5.6 10/02/2008   MICROALBUR 13.4* 08/19/2008     Assessment / Plan:

## 2011-08-23 NOTE — Assessment & Plan Note (Signed)
Will recheck a BMET on her return visit. We did not discuss nephrology referral today.

## 2011-08-23 NOTE — Patient Instructions (Signed)
Increase the Hydralazine to 50 mg three times a day  See Dr. Martinique in 6 weeks  Continue to monitor your blood pressure  You may start back exercising - start slow and increase gradually with a goal of 45 to 60 minutes a day  Call the [place] office at [phone] if you have any questions, problems or concerns.

## 2011-08-23 NOTE — Assessment & Plan Note (Signed)
Blood pressure is up. I have increased her Hydralazine to 50 mg TID. She seems to tolerate this. We have discussed the need for exercise in depth today with guidelines. She will see Dr. Martinique in 6 weeks. Patient is agreeable to this plan and will call if any problems develop in the interim.

## 2011-08-26 ENCOUNTER — Other Ambulatory Visit: Payer: Self-pay | Admitting: Family Medicine

## 2011-08-26 NOTE — Telephone Encounter (Signed)
Please call in for 30 days.  Notify Dr. Darnell Level as a FYI.  Thanks.

## 2011-08-26 NOTE — Telephone Encounter (Signed)
OK to refill in Dr. Synthia Innocent absence? Last filled 01/29/11.

## 2011-08-27 NOTE — Telephone Encounter (Signed)
Rx called in as directed.   

## 2011-09-02 ENCOUNTER — Telehealth: Payer: Self-pay | Admitting: Cardiology

## 2011-09-02 NOTE — Telephone Encounter (Signed)
Please return call to patient [phone], pt feels like she may be experiencing sides affects from new medication.

## 2011-09-02 NOTE — Telephone Encounter (Signed)
Pt c/o burning with urination and body aches for days. Pt does not have transportation currently for lab appointment, she will call her pcp to see if they will assist.

## 2011-09-03 ENCOUNTER — Telehealth: Payer: Self-pay | Admitting: Physician Assistant

## 2011-09-03 ENCOUNTER — Telehealth: Payer: Self-pay

## 2011-09-03 NOTE — Telephone Encounter (Signed)
She was upset abx was not called in by [facility]. Please call for update, offer appt today to come in for evaluation, but no abx without OV - practice policy.

## 2011-09-03 NOTE — Telephone Encounter (Signed)
Message left advising patient to call with an update and to schedule appt if needed. Advised that you cannot prescribe abx without seeing her to determine exactly what is going on with her and exactly what she may need.

## 2011-09-03 NOTE — Telephone Encounter (Signed)
Triage Record Num: [id number] Operator: Tommie Ard Patient Name: Nicole English Call Date & Time: 09/02/2011 5:12:59PM Patient Phone: [phone] PCP: Ria Bush Patient Gender: Female PCP Fax : [phone] Patient DOB: [birth year] Practice Name: [facility] Reason for Call: [name]: Tangela/Patient; PCP: Ria Bush; CB#: [phone]; Call regarding Urinary Pain/Bleeding; Onset a few days ago 08/31/11. NO fever. Last UOP 2 hours ago. She was able to go, +burning, +fequency, +urgency. +drinking water. No n/v/d. She is requesting medication. Emergent s/sx ruled out per Urinary Symptoms -Female with exception to "Has one or more urinary tract symptoms and has not been previously evaluated". See provider in 24 hours. [name] upset requesting medications. Advised I cannot call in antibotics after hours. Offered appointment for in the morning . Patient Declined. Offered UCC visit or after hours. 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 Obama care? Advised practice policy . Offered to assist and reviewed home. [name] ended call. Unsure what caller will do Protocol(s) Used: Urinary Symptoms - Female Recommended Outcome per Protocol: See Provider within 24 hours Reason for Outcome: Has one or more urinary tract symptoms AND has not been previously evaluated Care Advice: ~ Call provider if you develop flank or low back pain, fever, generally feel sick. Increase intake of fluids. Try to drink 8 oz. (.2 liter) every hour when awake, including unsweetened cranberry juice, unless on restricted fluids for other medical reasons. Take sips of fluid or eat ice chips if nauseated or vomiting. ~ ~ SYMPTOM / CONDITION MANAGEMENT Limit carbonated, alcoholic, and caffeinated beverages such as coffee, tea and soda. Avoid nonprescription cold and allergy medications that contain caffeine. Limit intake of tomatoes, fruit juices (except for unsweetened  cranberry juice), dairy products, spicy foods, sugar, and artificial sweeteners (aspartame or saccharine). Stop or decrease smoking. Reducing exposure to bladder irritants may help lessen urgency. ~ ~ Call

## 2011-09-03 NOTE — Telephone Encounter (Signed)
Patient called this evening worried about her symptoms. She just "doesn't feel good." She is having "bladder issues" and thinks she has a UTI so she took some spare antibiotics at home. She also has noted her BP has increased from [id number] systolic this evening and has remained elevated. She has had some L shoulder and arm pain that is new for her and she is quite concerned about this in the setting of high BP. I advised she go to the ER for evaluation given her concern but she was very hesitant and asked for other options. She has hydralazine and xanax due at midnight per her report. I advised she take these early and if symptoms persist, go to ER. If they resolve, she should return to her usual BP schedule and f/u in AM for formal urinary tract testing at PCP or urgent care if PCP does not have Saturday hours. She verbalized understanding and gratitude.

## 2011-09-04 ENCOUNTER — Emergency Department (HOSPITAL_COMMUNITY): Payer: Medicare Other

## 2011-09-04 ENCOUNTER — Encounter (HOSPITAL_COMMUNITY): Payer: Self-pay | Admitting: *Deleted

## 2011-09-04 ENCOUNTER — Emergency Department (HOSPITAL_COMMUNITY)
Admission: EM | Admit: 2011-09-04 | Discharge: 2011-09-04 | Disposition: A | Discharge status: Home / Self Care | Payer: Medicare Other | Attending: Emergency Medicine | Admitting: Emergency Medicine

## 2011-09-04 DIAGNOSIS — R51 Headache: Secondary | ICD-10-CM | POA: Diagnosis not present

## 2011-09-04 DIAGNOSIS — R0602 Shortness of breath: Secondary | ICD-10-CM | POA: Diagnosis not present

## 2011-09-04 DIAGNOSIS — I10 Essential (primary) hypertension: Secondary | ICD-10-CM | POA: Insufficient documentation

## 2011-09-04 DIAGNOSIS — R5381 Other malaise: Secondary | ICD-10-CM | POA: Diagnosis not present

## 2011-09-04 DIAGNOSIS — R300 Dysuria: Secondary | ICD-10-CM

## 2011-09-04 DIAGNOSIS — R42 Dizziness and giddiness: Secondary | ICD-10-CM | POA: Insufficient documentation

## 2011-09-04 DIAGNOSIS — I129 Hypertensive chronic kidney disease with stage 1 through stage 4 chronic kidney disease, or unspecified chronic kidney disease: Secondary | ICD-10-CM | POA: Diagnosis not present

## 2011-09-04 DIAGNOSIS — J449 Chronic obstructive pulmonary disease, unspecified: Secondary | ICD-10-CM | POA: Diagnosis not present

## 2011-09-04 DIAGNOSIS — R079 Chest pain, unspecified: Secondary | ICD-10-CM | POA: Insufficient documentation

## 2011-09-04 DIAGNOSIS — R5383 Other fatigue: Secondary | ICD-10-CM | POA: Insufficient documentation

## 2011-09-04 LAB — URINE MICROSCOPIC-ADD ON

## 2011-09-04 LAB — URINALYSIS, ROUTINE W REFLEX MICROSCOPIC
Bilirubin Urine: NEGATIVE
Glucose, UA: NEGATIVE mg/dL
Hgb urine dipstick: NEGATIVE
Ketones, ur: NEGATIVE mg/dL
Specific Gravity, Urine: 1.007 (ref 1.005–1.030)
pH: 6 (ref 5.0–8.0)

## 2011-09-04 LAB — CBC WITH DIFFERENTIAL/PLATELET
Basophils Absolute: 0.1 10*3/uL (ref 0.0–0.1)
Eosinophils Relative: 3 % (ref 0–5)
HCT: 31.2 % — ABNORMAL LOW (ref 36.0–46.0)
Hemoglobin: 10.6 g/dL — ABNORMAL LOW (ref 12.0–15.0)
Lymphocytes Relative: 19 % (ref 12–46)
MCHC: 34 g/dL (ref 30.0–36.0)
MCV: 92.6 fL (ref 78.0–100.0)
Monocytes Absolute: 0.8 10*3/uL (ref 0.1–1.0)
Monocytes Relative: 12 % (ref 3–12)
Neutro Abs: 4.6 10*3/uL (ref 1.7–7.7)
RDW: 13.4 % (ref 11.5–15.5)

## 2011-09-04 LAB — BASIC METABOLIC PANEL
BUN: 17 mg/dL (ref 6–23)
CO2: 21 mEq/L (ref 19–32)
Chloride: 99 mEq/L (ref 96–112)
Creatinine, Ser: 1.52 mg/dL — ABNORMAL HIGH (ref 0.50–1.10)
Potassium: 4.8 mEq/L (ref 3.5–5.1)

## 2011-09-04 LAB — TROPONIN I: Troponin I: <0.3 ng/mL (ref <0.30)

## 2011-09-04 MED ORDER — GI COCKTAIL ~~LOC~~
30.0000 mL | Freq: Once | ORAL | Status: AC
  Administered 2011-09-04: 30 mL via ORAL
  Filled 2011-09-04: qty 30

## 2011-09-04 NOTE — ED Notes (Signed)
C/o high bp tonight and she thinks she has a uti. Painful urination for 2-3 days.  She has increased her bp med per her doctor but the bp has not dropped

## 2011-09-04 NOTE — ED Provider Notes (Signed)
History     CSN: [id number]  Arrival date & time 09/04/11  0045   First MD Initiated Contact with Patient 09/04/11 0055      Chief Complaint  Patient presents with  . Hypertension    (Consider location/radiation/quality/duration/timing/severity/associated sxs/prior treatment) HPI 78 year old female presents to the emergency department with complaints of dysuria, and high blood pressure. Patient reports she was seen by her cardiologist about a week ago and noted that she had high blood pressure at that time. They increased her hydralazine to 50 mg 3 times a day. Patient is currently taking it at 8, 4, 12 midnight. Patient was checking her blood pressures tonight and may or elevated. She reports she took her blood pressure multiple times and it kept going up. Patient developed pain in her left shoulder and pressure in her left chest with numbness and tightness feeling in her left leg starting around 9 PM. Patient called the on-call cardiology nurse who recommended taking her hydralazine early. She reports taking this at 9:30 PM. Patient reports on the drive here she began to have discomfort in her left abdomen. She reports she has been nauseated since starting the increased dose of hydralazine. She reports she has had sweating episodes, but reports this is her baseline. She denies any shortness of breath. Patient reports she did negative stress test a year ago May. Patient reports 3-4 days of burning with urination. She has had pain over both the percutaneous. She denies any fever or chills. She denies previous history of kidney infections, but has had urinary tract infections in the past. Patient reports she had left over antibiotic that she took for 2 days hoping to treat her symptoms. She reports this was Levaquin.  Past Medical History  Diagnosis Date  . History of diverticulitis of colon   . HLD (hyperlipidemia)   . Anxiety   . OP (osteoporosis)   . Depression   . GERD (gastroesophageal  reflux disease)   . IBS (irritable bowel syndrome)   . Glucose intolerance (impaired glucose tolerance)   . Allergic rhinitis   . Migraine   . HTN (hypertension)     Dr. Martinique, cards  . SUI (stress urinary incontinence, female)     Dr. Amalia Hailey, urology (some urge as well)  . Diverticulosis     colonsocopy 2002 aborted 2/2 tortuous colon and heavy sigmoid diverticulosis  . Dysphagia     EGD 2012 - wnl, s/p esoph dilation  . CKD (chronic kidney disease) stage 3, GFR 30-59 ml/min   . Diastolic dysfunction     per echo in November 2012; normal EF    Past Surgical History  Procedure Date  . Tonsillectomy 1942  . Submucous resection 1962  . Tubal ligation 1973  . Breast enhancement surgery 1982  . Total abdominal hysterectomy 1996    for frequent dysmennorhea  . Macroplastique implantation 03/2008    Dr. Rogers Blocker  . Uretherolysis and removal of macroplastique 09/13/08    Dr. Amalia Hailey  . Ct head limited w/o cm 04/2010    nothing acute, ? old infarcts L internal capsule  . Renal doppler 2009?    simple cysts, wnl  . Colonoscopy 2002    does not want another!  . Esophagogastroduodenoscopy 2012    WNL, s/p esoph dilation (Dr. Candace Cruise, [facility])  . US echocardiography 11/2010    EF 55-60%, mild LAE, mild diastolic dysfunction, normal valves    Family History  Problem Relation Age of Onset  . Heart failure Father   .  Heart disease Father   . Cancer Father     prostate  . Hypertension Father   . Coronary artery disease Father   . Leukemia Mother     CLL prior    History  Substance Use Topics  . Smoking status: Former Research scientist (life sciences)  . Smokeless tobacco: Never Used  . Alcohol Use: Yes     1 1/2-2 glasses white wine/day    OB History    Grav Para Term Preterm Abortions TAB SAB Ect Mult Living                  Review of Systems  Constitutional: Positive for fatigue.  Neurological: Positive for weakness, light-headedness and headaches.  All other systems reviewed and are  negative.    Allergies  Doxycycline monohydrate and Ramipril  Home Medications   Current Outpatient Rx  Name Route Sig Dispense Refill  . ALPRAZOLAM 0.25 MG PO TABS  TAKE ONE TABLET BY MOUTH TWICE DAILY AS NEEDED FOR SLEEP AND FOR ANXIETY 60 tablet 0  . B COMPLEX PO TABS Oral Take 2 tablets by mouth daily.    Marland Kitchen VITAMIN D 1000 UNITS PO TABS Oral Take 1,000 Units by mouth daily.      . COQ10 50 MG PO CAPS Oral Take 1 capsule by mouth daily.      . FUROSEMIDE 20 MG PO TABS Oral Take 10 mg by mouth as needed. For fluid retention    . GABAPENTIN 100 MG PO CAPS Oral Take 1 capsule (100 mg total) by mouth 3 (three) times daily. 270 capsule 3  . HYDRALAZINE HCL 50 MG PO TABS Oral Take 1 tablet (50 mg total) by mouth 3 (three) times daily. 270 tablet 3  . HYDROCODONE-ACETAMINOPHEN 5-500 MG PO TABS  TAKE ONE TABLET BY MOUTH TWICE DAILY AS NEEDED FOR PAIN 60 tablet 0  . LEVOFLOXACIN 500 MG PO TABS Oral Take 500 mg by mouth daily.    . NEBIVOLOL HCL 5 MG PO TABS Oral Take 10 mg by mouth daily.    . NON FORMULARY Oral Take 1 tablet by mouth 4 (four) times daily. Sea calcium    . OMEPRAZOLE 20 MG PO CPDR Oral Take 20 mg by mouth daily as needed. For reflux    . ZOLPIDEM TARTRATE 10 MG PO TABS  TAKE ONE TABLET BY MOUTH EVERY DAY AS NEEDED FOR SLEEP 30 tablet 0    BP 200/78  Temp 97.9 F (36.6 C) (Oral)  Resp 18  SpO2 98%  Physical Exam  Nursing note and vitals reviewed. Constitutional: She is oriented to person, place, and time. She appears well-developed and well-nourished.  HENT:  Head: Normocephalic and atraumatic.  Nose: Nose normal.  Mouth/Throat: Oropharynx is clear and moist.  Eyes: Conjunctivae and EOM are normal. Pupils are equal, round, and reactive to light.  Neck: Normal range of motion. Neck supple. No JVD present. No tracheal deviation present. No thyromegaly present.  Cardiovascular: Normal rate, regular rhythm, normal heart sounds and intact distal pulses.  Exam reveals no  gallop and no friction rub.   No murmur heard. Pulmonary/Chest: Effort normal and breath sounds normal. No stridor. No respiratory distress. She has no wheezes. She has no rales. She exhibits no tenderness.  Abdominal: Soft. Bowel sounds are normal. She exhibits no distension and no mass. There is no tenderness. There is no rebound and no guarding.  Musculoskeletal: Normal range of motion. She exhibits no edema and no tenderness.  Lymphadenopathy:    She  has no cervical adenopathy.  Neurological: She is alert and oriented to person, place, and time. She exhibits normal muscle tone. Coordination normal.  Skin: Skin is warm and dry. No rash noted. No erythema. No pallor.  Psychiatric: She has a normal mood and affect. Her behavior is normal. Judgment and thought content normal.    ED Course  Procedures (including critical care time)  Labs Reviewed  URINALYSIS, ROUTINE W REFLEX MICROSCOPIC - Abnormal; Notable for the following:    Protein, ur 100 (*)     All other components within normal limits  BASIC METABOLIC PANEL - Abnormal; Notable for the following:    Sodium 132 (*)     Glucose, Bld 109 (*)     Creatinine, Ser 1.52 (*)     GFR calc non Af Amer 32 (*)     GFR calc Af Amer 37 (*)     All other components within normal limits  CBC WITH DIFFERENTIAL - Abnormal; Notable for the following:    RBC 3.37 (*)     Hemoglobin 10.6 (*)     HCT 31.2 (*)     All other components within normal limits  TROPONIN I  URINE MICROSCOPIC-ADD ON  TROPONIN I   Dg Chest 2 View  09/04/2011  *RADIOLOGY REPORT*  Clinical Data: Chest pain, shortness of breath.  CHEST - 2 VIEW  Comparison: 08/17/2010  Findings: There is hyperinflation of the lungs compatible with COPD.  Heart is borderline in size.  No confluent opacities or effusions.  No acute bony abnormality.  IMPRESSION: COPD.  No active disease.  Original Report Authenticated By: Raelyn Number, M.D.    Date: 09/04/2011  Rate: 61  Rhythm: normal  sinus rhythm  QRS Axis: normal  Intervals: normal  ST/T Wave abnormalities: normal  Conduction Disutrbances:none  Narrative Interpretation:   Old EKG Reviewed: unchanged   1. Hypertension   2. Dysuria       MDM   78 yo female with htn, left shoulder pain, chest pressure also with urinary symptoms.  Will check ua, cxr, labs, and monitor BP.  Will d/w cardiology regarind bp if remains high.   5:39 AM Patient has had 2 negative troponins. Pain relieved with GI cocktail. Discuss case with Dr. Lovena Le, on call for [facility] cardiology. He does not wish to change her antihypertensive at this time, recommend following up with patient's cardiologist on Monday. He does not recommend any further workup or management.    Kalman Drape, MD 09/04/11 [phone]

## 2011-09-04 NOTE — ED Notes (Signed)
Pt has multi complaints.  Sts has had pressure type pain in her chest, hypertensive at home,  Pt c/o bil lower back pain with frequent urination and burning.  When Dr. Sharol Given ask her about her headache she st's she has had pain in back of her head when Dr. Sharol Given ask her when it started she said just when she asked.  Also st's en route to ED st's she started having pain in left lower abd.

## 2011-09-06 ENCOUNTER — Telehealth: Payer: Self-pay | Admitting: Cardiology

## 2011-09-06 NOTE — Telephone Encounter (Signed)
New msg Pt called and said her BP went up this weekend and she went to ED on Friday. Please call

## 2011-09-06 NOTE — Telephone Encounter (Signed)
Pt calls, anxious about recent ED visit this past weekend with her blood pressure and dysuria. She reports her blood pressure is better today at: 126/77    Wilburn Mylar it was: 138/86 before meds  After meds: 117.72  Much reassurance given to pt. Discussed taking her medications with food especially her hydrazline. Discussed dietary issues and possible foods to eat. She is feeling better today.  She will be out of town for a wedding this Friday and does not want to have to come in this week.  Reassurance given   I will forward to Dr. Martinique and Malachy Mood. She is aware if any changes need to be made she will receive a call back  She has an appt with Dr. Martinique on 10/06/11 and will keep that appt. Horton Chin RN

## 2011-09-07 NOTE — Telephone Encounter (Signed)
Patient called stated she was better today no chest pressure and blood pressure has calmed down.Advised to keep appointment with Dr.Jordan 10/04/11 and call sooner if needed.

## 2011-09-13 ENCOUNTER — Telehealth: Payer: Self-pay | Admitting: Cardiology

## 2011-09-13 NOTE — Telephone Encounter (Signed)
PT HAVING INCREAEE IN FLUID, LEGS, FEET, HER TOES AND HER VOICE, PLS CALL [phone]

## 2011-09-13 NOTE — Telephone Encounter (Signed)
Patient states weight up 4 to 5 pounds since last week.    blood pressure 132/82 this am and more recently 127/68  Recently her Hydralazine was increased to 50 mg tid and symptoms are getting worse, lots of fluid retention.  Takes lasix 10 mg as needed but has increased to daily over last 4 to 5 days and this does not seem to be helping.  Does not notice an increased output since taking Lasix daily  Patient having increased shortness of breath, throat feels dry.    Discussed with Dr Lovena Le and will have patient double Lasix (to 20 mg) for next 3 days and try to get her in to see Dr Martinique soon.  Advised patient.  Will forward to Dr Martinique and [facility] LPN

## 2011-09-14 NOTE — Telephone Encounter (Signed)
Patient was called stated she still felt bad.Stated she does not like hydralazine causing no energy,dizzy, she just dont feel like getting out of bed.States she is still sob.Appointment scheduled with Dr.Jordan tomorrow 09/15/11.

## 2011-09-15 ENCOUNTER — Ambulatory Visit (INDEPENDENT_AMBULATORY_CARE_PROVIDER_SITE_OTHER): Payer: Medicare Other | Admitting: Cardiology

## 2011-09-15 ENCOUNTER — Other Ambulatory Visit: Payer: Self-pay | Admitting: Family Medicine

## 2011-09-15 ENCOUNTER — Encounter: Payer: Self-pay | Admitting: Cardiology

## 2011-09-15 VITALS — BP 149/72 | HR 57 | Wt 129.2 lb

## 2011-09-15 DIAGNOSIS — N189 Chronic kidney disease, unspecified: Secondary | ICD-10-CM

## 2011-09-15 DIAGNOSIS — I10 Essential (primary) hypertension: Secondary | ICD-10-CM | POA: Diagnosis not present

## 2011-09-15 MED ORDER — DOXAZOSIN MESYLATE 2 MG PO TABS: 2.0000 mg | ORAL_TABLET | Freq: Every day | ORAL | Status: DC

## 2011-09-15 NOTE — Telephone Encounter (Signed)
Ok to refill 

## 2011-09-15 NOTE — Progress Notes (Signed)
[name] Date of Birth: [birth year] Medical Record [id number]  History of Present Illness: Nicole English is seen today for a follow up visit.  She has no known CAD with negative stress test last year at [facility]. Does have HTN with diastolic dysfunction, HLD, anxiety, depression, diverticulosis and CKD. She has been intolerant to ACE/ARB due to her renal function and HTN. She has been intolerant of amlodipine because of edema.HCTZ has also affected her renal indices. On her last visit her hydralazine dose was increased to 50 mg 3 times a day. Since then she has not felt well. She complains of headache and increased fluid retention. She has numbness in her lips and tongue. She has actually gained 18 pounds. She did increase her Lasix over the last 3 days to 20 mg daily and this has improved somewhat. She denies any chest pain or shortness of breath.  She does complain of fatigue.   Current Outpatient Prescriptions on File Prior to Visit  Medication Sig Dispense Refill  . b complex vitamins tablet Take 2 tablets by mouth daily.      . cholecalciferol (VITAMIN D) 1000 UNITS tablet Take 1,000 Units by mouth daily.        . Coenzyme Q10 (COQ10) 50 MG CAPS Take 1 capsule by mouth daily.        . furosemide (LASIX) 20 MG tablet Take 20 mg by mouth as needed. For fluid retention      . gabapentin (NEURONTIN) 100 MG capsule Take 1 capsule (100 mg total) by mouth 3 (three) times daily.  270 capsule  3  . hydrALAZINE (APRESOLINE) 50 MG tablet Take 1 tablet (50 mg total) by mouth 3 (three) times daily.  270 tablet  3  . HYDROcodone-acetaminophen (VICODIN) 5-500 MG per tablet TAKE ONE TABLET BY MOUTH TWICE DAILY AS NEEDED FOR PAIN  60 tablet  0  . nebivolol (BYSTOLIC) 5 MG tablet Take 10 mg by mouth daily.      . NON FORMULARY Take 1 tablet by mouth 4 (four) times daily. Sea calcium      . omeprazole (PRILOSEC) 20 MG capsule Take 20 mg by mouth daily as needed. For reflux      . zolpidem (AMBIEN) 10 MG  tablet TAKE ONE TABLET BY MOUTH EVERY DAY AS NEEDED FOR SLEEP  30 tablet  0  . doxazosin (CARDURA) 2 MG tablet Take 1 tablet (2 mg total) by mouth at bedtime.  30 tablet  6  . DISCONTD: ALPRAZolam (XANAX) 0.25 MG tablet TAKE ONE TABLET BY MOUTH TWICE DAILY AS NEEDED FOR SLEEP AND FOR ANXIETY  60 tablet  0    Allergies  Allergen Reactions  . Doxycycline Monohydrate     REACTION: Vomitting  . Ramipril     REACTION: worsening renal function    Past Medical History  Diagnosis Date  . History of diverticulitis of colon   . HLD (hyperlipidemia)   . Anxiety   . OP (osteoporosis)   . Depression   . GERD (gastroesophageal reflux disease)   . IBS (irritable bowel syndrome)   . Glucose intolerance (impaired glucose tolerance)   . Allergic rhinitis   . Migraine   . HTN (hypertension)     Dr. Martinique, cards  . SUI (stress urinary incontinence, female)     Dr. Amalia Hailey, urology (some urge as well)  . Diverticulosis     colonsocopy 2002 aborted 2/2 tortuous colon and heavy sigmoid diverticulosis  . Dysphagia     EGD  2012 - wnl, s/p esoph dilation  . CKD (chronic kidney disease) stage 3, GFR 30-59 ml/min   . Diastolic dysfunction     per echo in November 2012; normal EF    Past Surgical History  Procedure Date  . Tonsillectomy 1942  . Submucous resection 1962  . Tubal ligation 1973  . Breast enhancement surgery 1982  . Total abdominal hysterectomy 1996    for frequent dysmennorhea  . Macroplastique implantation 03/2008    Dr. Rogers Blocker  . Uretherolysis and removal of macroplastique 09/13/08    Dr. Amalia Hailey  . Ct head limited w/o cm 04/2010    nothing acute, ? old infarcts L internal capsule  . Renal doppler 2009?    simple cysts, wnl  . Colonoscopy 2002    does not want another!  . Esophagogastroduodenoscopy 2012    WNL, s/p esoph dilation (Dr. Candace Cruise, [facility])  . US echocardiography 11/2010    EF 55-60%, mild LAE, mild diastolic dysfunction, normal valves    History  Smoking status  .  Former Smoker  Smokeless tobacco  . Never Used    History  Alcohol Use  . Yes    1 1/2-2 glasses white wine/day    Family History  Problem Relation Age of Onset  . Heart failure Father   . Heart disease Father   . Cancer Father     prostate  . Hypertension Father   . Coronary artery disease Father   . Leukemia Mother     CLL prior    Review of Systems: The review of systems is per the HPI.  All other systems were reviewed and are negative.  Physical Exam: BP 149/72  Pulse 57  Wt 129 lb 3.2 oz (58.605 kg)  SpO2 97% Patient is very pleasant and in no acute distress. Skin is warm and dry. Color is normal.  HEENT is unremarkable. Normocephalic/atraumatic. PERRL. Sclera are nonicteric. Neck is supple. No masses. No JVD. Lungs are clear. Cardiac exam shows a regular rate and rhythm. Abdomen is soft. Extremities are without edema. Gait and ROM are intact. No gross neurologic deficits noted.   LABORATORY DATA: ECG dated 09/04/2011 was normal. Lab work that same day demonstrated a sodium of 132, potassium 4.8, creatinine 1.5. Hemoglobin was 10.6.  Assessment / Plan: 1. Hypertension. Patient has multiple drug intolerances as listed. Clearly she is not tolerating the higher dose of hydralazine. I recommended reducing her dose back to 25 mg 3 times a day. We will add Cardura 2 mg each bedtime. I will followup again in 4 weeks and we will make further adjustments at that time. She will continue with bystolic.prior evaluation for secondary causes of hypertension was unremarkable with normal renal Dopplers and normal metanephrines.  2. Edema. I suspect this is related to her hydralazine. Continue Lasix as needed for now.  3. Chronic kidney disease stage II.  4. Chronic anemia.

## 2011-09-15 NOTE — Patient Instructions (Signed)
Reduce hydralazine to 25 mg three times a day.  Start Cardura 2 mg at bedtime.  I will see you again in 4 weeks

## 2011-09-15 NOTE — Telephone Encounter (Signed)
Rx called in as directed.   

## 2011-09-16 ENCOUNTER — Other Ambulatory Visit: Payer: Self-pay | Admitting: Family Medicine

## 2011-09-16 NOTE — Telephone Encounter (Signed)
Ok to refill 

## 2011-09-17 DIAGNOSIS — H04129 Dry eye syndrome of unspecified lacrimal gland: Secondary | ICD-10-CM | POA: Diagnosis not present

## 2011-09-17 DIAGNOSIS — Z961 Presence of intraocular lens: Secondary | ICD-10-CM | POA: Diagnosis not present

## 2011-09-17 DIAGNOSIS — H40019 Open angle with borderline findings, low risk, unspecified eye: Secondary | ICD-10-CM | POA: Diagnosis not present

## 2011-09-17 NOTE — Telephone Encounter (Signed)
Rx called in as directed.   

## 2011-10-06 ENCOUNTER — Ambulatory Visit (INDEPENDENT_AMBULATORY_CARE_PROVIDER_SITE_OTHER): Payer: Medicare Other | Admitting: Cardiology

## 2011-10-06 ENCOUNTER — Encounter: Payer: Self-pay | Admitting: Cardiology

## 2011-10-06 VITALS — BP 128/80 | HR 60 | Ht 62.0 in | Wt 128.0 lb

## 2011-10-06 DIAGNOSIS — I10 Essential (primary) hypertension: Secondary | ICD-10-CM

## 2011-10-06 DIAGNOSIS — N3941 Urge incontinence: Secondary | ICD-10-CM | POA: Diagnosis not present

## 2011-10-06 NOTE — Patient Instructions (Signed)
Continue your same therapy.  I will see you in 3 months.

## 2011-10-06 NOTE — Progress Notes (Signed)
[name] Date of Birth: May 12, 1932 Medical Record [id number]  History of Present Illness: Nicole English is seen today for a follow up visit.She has a history of HTN with diastolic dysfunction, HLD, anxiety, depression, diverticulosis and CKD. She has been intolerant to ACE/ARB due to her renal function and HTN. She has been intolerant of amlodipine because of edema.HCTZ has also affected her renal indices. She was also intolerant to higher dose of hydralazine with complaints of increased swelling and headache. On her last visit we reduced this to 25 mg 3 times a day and added Cardura 2 mg at bedtime. She has felt much better. Her blood pressure has been under good control.   Current Outpatient Prescriptions on File Prior to Visit  Medication Sig Dispense Refill  . ALPRAZolam (XANAX) 0.25 MG tablet TAKE ONE TABLET BY MOUTH TWICE DAILY AS NEEDED FOR SLEEP AND FOR ANXIETY  60 tablet  0  . b complex vitamins tablet Take 2 tablets by mouth daily.      . cholecalciferol (VITAMIN D) 1000 UNITS tablet Take 1,000 Units by mouth daily.        . Coenzyme Q10 (COQ10) 50 MG CAPS Take 1 capsule by mouth daily.        Marland Kitchen doxazosin (CARDURA) 2 MG tablet Take 1 tablet (2 mg total) by mouth at bedtime.  30 tablet  6  . furosemide (LASIX) 20 MG tablet Take 20 mg by mouth as needed. For fluid retention      . gabapentin (NEURONTIN) 100 MG capsule Take 1 capsule (100 mg total) by mouth 3 (three) times daily.  270 capsule  3  . HYDROcodone-acetaminophen (VICODIN) 5-500 MG per tablet TAKE ONE TABLET BY MOUTH TWICE DAILY AS NEEDED FOR PAIN  60 tablet  0  . nebivolol (BYSTOLIC) 5 MG tablet Take 10 mg by mouth daily.      . NON FORMULARY Take 1 tablet by mouth 4 (four) times daily. Sea calcium      . omeprazole (PRILOSEC) 20 MG capsule Take 20 mg by mouth daily as needed. For reflux      . zolpidem (AMBIEN) 10 MG tablet TAKE ONE TABLET BY MOUTH EVERY DAY AS NEEDED FOR SLEEP  30 tablet  0    Allergies  Allergen  Reactions  . Doxycycline Monohydrate     REACTION: Vomitting  . Ramipril     REACTION: worsening renal function    Past Medical History  Diagnosis Date  . History of diverticulitis of colon   . HLD (hyperlipidemia)   . Anxiety   . OP (osteoporosis)   . Depression   . GERD (gastroesophageal reflux disease)   . IBS (irritable bowel syndrome)   . Glucose intolerance (impaired glucose tolerance)   . Allergic rhinitis   . Migraine   . HTN (hypertension)     Dr. Martinique, cards  . SUI (stress urinary incontinence, female)     Dr. Amalia Hailey, urology (some urge as well)  . Diverticulosis     colonsocopy 2002 aborted 2/2 tortuous colon and heavy sigmoid diverticulosis  . Dysphagia     EGD 2012 - wnl, s/p esoph dilation  . CKD (chronic kidney disease) stage 3, GFR 30-59 ml/min   . Diastolic dysfunction     per echo in November 2012; normal EF    Past Surgical History  Procedure Date  . Tonsillectomy 1942  . Submucous resection 1962  . Tubal ligation 1973  . Breast enhancement surgery 1982  . Total abdominal  hysterectomy 1996    for frequent dysmennorhea  . Macroplastique implantation 03/2008    Dr. Rogers Blocker  . Uretherolysis and removal of macroplastique 09/13/08    Dr. Amalia Hailey  . Ct head limited w/o cm 04/2010    nothing acute, ? old infarcts L internal capsule  . Renal doppler 2009?    simple cysts, wnl  . Colonoscopy 2002    does not want another!  . Esophagogastroduodenoscopy 2012    WNL, s/p esoph dilation (Dr. Candace Cruise, [facility])  . US echocardiography 11/2010    EF 55-60%, mild LAE, mild diastolic dysfunction, normal valves    History  Smoking status  . Former Smoker  Smokeless tobacco  . Never Used    History  Alcohol Use  . Yes    1 1/2-2 glasses white wine/day    Family History  Problem Relation Age of Onset  . Heart failure Father   . Heart disease Father   . Cancer Father     prostate  . Hypertension Father   . Coronary artery disease Father   . Leukemia  Mother     CLL prior    Review of Systems: The review of systems is per the HPI.  All other systems were reviewed and are negative.  Physical Exam: BP 128/80  Pulse 60  Ht 5\' 2"  (1.575 m)  Wt 128 lb (58.06 kg)  BMI 23.41 kg/m2 Patient is very pleasant and in no acute distress. Skin is warm and dry. Color is normal.  HEENT is unremarkable. Normocephalic/atraumatic. PERRL. Sclera are nonicteric. Neck is supple. No masses. No JVD. Lungs are clear. Cardiac exam shows a regular rate and rhythm. Abdomen is soft. Extremities are without edema. Gait and ROM are intact. No gross neurologic deficits noted.   LABORATORY DATA:   Assessment / Plan: 1. Hypertension. Patient has multiple drug intolerances as listed. She is doing much better now on Cardura with a reduction in her hydralazine dose. We will continue on her current therapy. I will followup again in 3 months. We could consider reducing her hydralazine further for blood pressure remains under good control.  2. Edema. Improved  3. Chronic kidney disease stage II.  4. Chronic anemia.

## 2011-10-13 ENCOUNTER — Other Ambulatory Visit: Payer: Self-pay | Admitting: Family Medicine

## 2011-10-13 NOTE — Telephone Encounter (Signed)
plz phone in. 

## 2011-10-14 NOTE — Telephone Encounter (Signed)
Rx called in as directed.   

## 2011-10-15 ENCOUNTER — Other Ambulatory Visit: Payer: Self-pay | Admitting: Family Medicine

## 2011-10-15 NOTE — Telephone Encounter (Signed)
Rx called in as directed.   

## 2011-10-15 NOTE — Telephone Encounter (Signed)
OK to refill

## 2011-10-15 NOTE — Telephone Encounter (Signed)
plz phone in. 

## 2011-10-19 ENCOUNTER — Other Ambulatory Visit: Payer: Self-pay | Admitting: *Deleted

## 2011-10-19 ENCOUNTER — Other Ambulatory Visit: Payer: Self-pay | Admitting: Family Medicine

## 2011-10-19 NOTE — Telephone Encounter (Signed)
plz refill and notify pt.

## 2011-10-19 NOTE — Telephone Encounter (Signed)
Rx called in as directed.   

## 2011-10-19 NOTE — Telephone Encounter (Signed)
The pt called the triage line and is hoping to get a refill of ambien 10mg  sent to the [facility] on [address].  Pt callback - [phone]  Thanks!

## 2011-10-19 NOTE — Telephone Encounter (Signed)
OK to refill

## 2011-10-26 ENCOUNTER — Ambulatory Visit (INDEPENDENT_AMBULATORY_CARE_PROVIDER_SITE_OTHER): Payer: Medicare Other | Admitting: Family Medicine

## 2011-10-26 ENCOUNTER — Encounter: Payer: Self-pay | Admitting: Family Medicine

## 2011-10-26 VITALS — BP 128/64 | HR 60 | Temp 97.5°F | Resp 20 | Ht 62.0 in | Wt 131.0 lb

## 2011-10-26 DIAGNOSIS — R0989 Other specified symptoms and signs involving the circulatory and respiratory systems: Secondary | ICD-10-CM

## 2011-10-26 DIAGNOSIS — I519 Heart disease, unspecified: Secondary | ICD-10-CM

## 2011-10-26 DIAGNOSIS — J3489 Other specified disorders of nose and nasal sinuses: Secondary | ICD-10-CM

## 2011-10-26 DIAGNOSIS — I10 Essential (primary) hypertension: Secondary | ICD-10-CM | POA: Diagnosis not present

## 2011-10-26 DIAGNOSIS — Z23 Encounter for immunization: Secondary | ICD-10-CM | POA: Diagnosis not present

## 2011-10-26 DIAGNOSIS — I5189 Other ill-defined heart diseases: Secondary | ICD-10-CM | POA: Insufficient documentation

## 2011-10-26 DIAGNOSIS — J449 Chronic obstructive pulmonary disease, unspecified: Secondary | ICD-10-CM | POA: Insufficient documentation

## 2011-10-26 DIAGNOSIS — R0981 Nasal congestion: Secondary | ICD-10-CM

## 2011-10-26 NOTE — Assessment & Plan Note (Signed)
Followed by Dr. Martinique.  No changes today.

## 2011-10-26 NOTE — Patient Instructions (Signed)
Return at your convenience for office spirometry to check for COPD. Flu shot today. For head congestion - start nasal saline regularly - nasal mucosa is dry.  Also start simple mucinex or immediate release guaifenesin with plenty of fluid to help break up and mobilize mucous out. For chest - let's take lasix one pill daily for 3 days then just as needed again. Lungs otherwise sounding clear.  No infection today. Good to see you today, call us with questions.

## 2011-10-26 NOTE — Progress Notes (Signed)
Subjective:    Patient ID: Nicole English, female    DOB: 1932-10-07, 78 y.o.   MRN: [id number]  HPI CC: not feeling well  Nicole English presents today for acute visit of not feeling well, for several weeks to months.  Having nasal and sinus congestion as well as chest congestion.  +PNdrainage as well as "fluid in chest".  + headache.   No fevers/chills, abd pain, n/v, ear or tooth pain, sore throat.   No sick contacts at home.  No smokers at home.  No h/o asthma/COPD.  Has never been on inhaler.  HTN - multiple intolerances to blood pressure medicines in past, followed by Dr. Martinique of cardiology.  Currently on doxazosin 2mg  daily, hydralazine 25mg  tid.  Also on lasix prn.  Uses 1/2 tablet at a time, 3-4 times per week.  Blood pressures when feels ill running 150-182/104-110.  Only checks when feeling ill.  CXR 08/2011 (last ER visit) - hyperinflation.  Ex smoker - states was only social smoker, never heavy.  Denies cough, wheezing.  + shortness of breath present at baseline.    Requests flu shot today.  Past Medical History  Diagnosis Date  . History of diverticulitis of colon   . HLD (hyperlipidemia)   . Anxiety   . OP (osteoporosis)   . Depression   . GERD (gastroesophageal reflux disease)   . IBS (irritable bowel syndrome)   . Glucose intolerance (impaired glucose tolerance)   . Allergic rhinitis   . Migraine   . HTN (hypertension)     Dr. Martinique, cards  . SUI (stress urinary incontinence, female)     Dr. Amalia Hailey, urology (some urge as well)  . Diverticulosis     colonsocopy 2002 aborted 2/2 tortuous colon and heavy sigmoid diverticulosis  . Dysphagia     EGD 2012 - wnl, s/p esoph dilation  . CKD (chronic kidney disease) stage 3, GFR 30-59 ml/min   . Diastolic dysfunction     per echo in November 2012; normal EF    Review of Systems Per HPI    Objective:   Physical Exam  Nursing note and vitals reviewed. Constitutional: She is oriented to person, place, and time. She  appears well-developed and well-nourished. No distress.  HENT:  Head: Normocephalic and atraumatic.  Right Ear: Hearing, tympanic membrane, external ear and ear canal normal.  Left Ear: Hearing, tympanic membrane, external ear and ear canal normal.  Nose: Nose normal. No mucosal edema or rhinorrhea.  Mouth/Throat: Uvula is midline, oropharynx is clear and moist and mucous membranes are normal. No oropharyngeal exudate, posterior oropharyngeal edema, posterior oropharyngeal erythema or tonsillar abscesses.       Nasal mucosal irritation  Eyes: Conjunctivae normal and EOM are normal. Pupils are equal, round, and reactive to light. No scleral icterus.  Neck: Normal range of motion. Neck supple.  Cardiovascular: Normal rate, regular rhythm, normal heart sounds and intact distal pulses.   No murmur heard. Pulses:      Radial pulses are 2+ on the right side, and 2+ on the left side.  Pulmonary/Chest: Effort normal and breath sounds normal. No respiratory distress. She has no wheezes. She has no rales.       Slight crackles L basilar region  Abdominal: Soft. Bowel sounds are normal. She exhibits no distension and no mass. There is no tenderness. There is no rebound and no guarding.  Musculoskeletal: Normal range of motion. She exhibits no edema.  Lymphadenopathy:    She has no cervical  adenopathy.  Neurological: She is alert and oriented to person, place, and time.       CN grossly intact, station and gait intact  Skin: Skin is warm and dry. No rash noted.  Psychiatric: She has a normal mood and affect. Her behavior is normal. Judgment and thought content normal.       Assessment & Plan:

## 2011-10-26 NOTE — Assessment & Plan Note (Signed)
Recommended mucinex and nasal saline given evident nasal mucosal irritation.  No evidence of sinus infection currently.

## 2011-10-26 NOTE — Assessment & Plan Note (Signed)
Mild crackles left lower lobe - will treat with 3 d of lasix 20mg  daily.

## 2011-10-26 NOTE — Assessment & Plan Note (Signed)
Reasonable to return for spirometry given smoking hx and CXR findings from 08/2011.  Endorses longstanding SOB.  No coughing or wheezing.

## 2011-10-27 ENCOUNTER — Telehealth: Payer: Self-pay

## 2011-10-27 NOTE — Telephone Encounter (Signed)
Pt misplaced my chart info from 10/26/11 visit; info given to pt while on phone.

## 2011-10-31 ENCOUNTER — Other Ambulatory Visit: Payer: Self-pay | Admitting: Nurse Practitioner

## 2011-11-09 ENCOUNTER — Ambulatory Visit: Payer: Medicare Other | Admitting: Family Medicine

## 2011-11-10 DIAGNOSIS — N3941 Urge incontinence: Secondary | ICD-10-CM | POA: Diagnosis not present

## 2011-11-14 ENCOUNTER — Other Ambulatory Visit: Payer: Self-pay | Admitting: Family Medicine

## 2011-11-15 ENCOUNTER — Other Ambulatory Visit: Payer: Self-pay | Admitting: Family Medicine

## 2011-11-15 NOTE — Telephone Encounter (Signed)
plz phone in. 

## 2011-11-15 NOTE — Telephone Encounter (Signed)
Rx's called in as directed.  

## 2011-12-10 ENCOUNTER — Other Ambulatory Visit: Payer: Self-pay | Admitting: Family Medicine

## 2011-12-10 NOTE — Telephone Encounter (Signed)
plz phone in. 

## 2011-12-10 NOTE — Telephone Encounter (Signed)
Rx called in as directed.   

## 2011-12-10 NOTE — Telephone Encounter (Signed)
Pt is out of med and request Zolpidem sent to [facility]. Pt request call back when refilled [phone].

## 2011-12-13 ENCOUNTER — Other Ambulatory Visit: Payer: Self-pay | Admitting: Family Medicine

## 2011-12-14 ENCOUNTER — Other Ambulatory Visit: Payer: Self-pay | Admitting: Family Medicine

## 2011-12-14 NOTE — Telephone Encounter (Signed)
plz phone in. 

## 2011-12-14 NOTE — Telephone Encounter (Signed)
Medicine called to [facility]. 

## 2011-12-17 ENCOUNTER — Other Ambulatory Visit: Payer: Self-pay | Admitting: Family Medicine

## 2011-12-19 NOTE — Telephone Encounter (Signed)
plz phone in. 

## 2011-12-20 ENCOUNTER — Other Ambulatory Visit: Payer: Self-pay | Admitting: Family Medicine

## 2011-12-20 NOTE — Telephone Encounter (Signed)
Called in RX to [facility].

## 2011-12-20 NOTE — Telephone Encounter (Signed)
plz phone in if not already done 12/17/2011.

## 2011-12-21 ENCOUNTER — Other Ambulatory Visit: Payer: Self-pay | Admitting: *Deleted

## 2011-12-21 MED ORDER — HYDROCODONE-ACETAMINOPHEN 5-325 MG PO TABS: 1.0000 | ORAL_TABLET | Freq: Two times a day (BID) | ORAL | Status: DC | PRN

## 2011-12-21 NOTE — Telephone Encounter (Signed)
plz phone in new med.  Changed earlier this morning already.

## 2011-12-21 NOTE — Telephone Encounter (Addendum)
Spoke to Nicole English at pharmacy and was advised that they can not get this dose any longer.  Can they switch to something else such as Norco 5/325 mg? Please advise.

## 2011-12-21 NOTE — Telephone Encounter (Signed)
Yes ok to switch - changed in med list.  plz call in.

## 2011-12-21 NOTE — Telephone Encounter (Signed)
Rx called to pharmacy as instructed. 

## 2011-12-21 NOTE — Telephone Encounter (Signed)
Received fax from pharmacy, that vicodin 5/500 is to be d/c and is hard to get. Pharmacy wants to know if it is ok to switch to 5/325 or 5/300 (5/325 is cheaper).

## 2012-01-05 ENCOUNTER — Telehealth: Payer: Self-pay | Admitting: Cardiology

## 2012-01-05 ENCOUNTER — Ambulatory Visit (INDEPENDENT_AMBULATORY_CARE_PROVIDER_SITE_OTHER): Payer: Medicare Other | Admitting: Cardiology

## 2012-01-05 ENCOUNTER — Encounter: Payer: Self-pay | Admitting: Cardiology

## 2012-01-05 VITALS — BP 126/66 | HR 62 | Wt 133.0 lb

## 2012-01-05 DIAGNOSIS — I10 Essential (primary) hypertension: Secondary | ICD-10-CM

## 2012-01-05 DIAGNOSIS — I519 Heart disease, unspecified: Secondary | ICD-10-CM | POA: Diagnosis not present

## 2012-01-05 DIAGNOSIS — I5189 Other ill-defined heart diseases: Secondary | ICD-10-CM

## 2012-01-05 NOTE — Telephone Encounter (Signed)
Spoke to patient she stated she was upset.States when she got home and was reading her after visit summary she saw diagnosis of chronic kidney disease stage 3 and she has never seen that before.Patient was told that diagnosis has been on previous after summaries and this was not new.Pt states no one ever told her about having chronic kidney disease.Patient was reassured

## 2012-01-05 NOTE — Telephone Encounter (Signed)
Spoke to patient was told Dr.Jordan  advised good blood pressure control.He will continue to monitor kidney functions,no further treatment at this time.

## 2012-01-05 NOTE — Telephone Encounter (Signed)
Pt calling re being in today and not understanding where we got the diagnosis of kidney disease? pls call

## 2012-01-05 NOTE — Patient Instructions (Signed)
Reduce hydralazine 12.5 mg three times daily. Quarter of a 50 mg tablet.  Continue your other medication  I will see you in 3 months.

## 2012-01-05 NOTE — Progress Notes (Signed)
[name] Date of Birth: [birth year] Medical Record [id number]  History of Present Illness: Nicole English is seen today for a follow up visit.She has a history of HTN with diastolic dysfunction, HLD, anxiety, depression, diverticulosis and CKD. She has been intolerant to ACE/ARB due to her renal function and HTN. She has been intolerant of amlodipine because of edema.HCTZ has also affected her renal indices. She was also intolerant to higher dose of hydralazine with complaints of increased swelling and headache. On her last visit we reduced her hydralazine dose to 25 mg 3 times a day and started her on Cardura. She feels very well. She has no symptoms of dizziness, palpitations, or headache.   Current Outpatient Prescriptions on File Prior to Visit  Medication Sig Dispense Refill  . ALPRAZolam (XANAX) 0.25 MG tablet TAKE ONE TABLET BY MOUTH TWICE DAILY AS NEEDED FOR SLEEP OR  ANXIETY  60 tablet  0  . aspirin EC 81 MG tablet Take 81 mg by mouth daily.      Marland Kitchen b complex vitamins tablet Take 2 tablets by mouth daily.      Marland Kitchen BYSTOLIC 5 MG tablet TAKE TWO TABLETS BY MOUTH EVERY DAY  60 tablet  6  . cholecalciferol (VITAMIN D) 1000 UNITS tablet Take 1,000 Units by mouth daily.        . Coenzyme Q10 (COQ10) 50 MG CAPS Take 1 capsule by mouth daily.        Marland Kitchen doxazosin (CARDURA) 2 MG tablet Take 1 tablet (2 mg total) by mouth at bedtime.  30 tablet  6  . furosemide (LASIX) 20 MG tablet Take 20 mg by mouth as needed. For fluid retention      . gabapentin (NEURONTIN) 100 MG capsule Take 1 capsule (100 mg total) by mouth 3 (three) times daily.  270 capsule  3  . hydrALAZINE (APRESOLINE) 50 MG tablet Take 25 mg by mouth 3 (three) times daily.      Marland Kitchen HYDROcodone-acetaminophen (NORCO/VICODIN) 5-325 MG per tablet Take 1 tablet by mouth 2 (two) times daily as needed for pain.  60 tablet  0  . nebivolol (BYSTOLIC) 5 MG tablet Take 10 mg by mouth daily.      . NON FORMULARY Take 1 tablet by mouth 4 (four) times  daily. Sea calcium      . omeprazole (PRILOSEC) 20 MG capsule Take 20 mg by mouth daily as needed. For reflux      . zolpidem (AMBIEN) 10 MG tablet TAKE ONE TABLET BY MOUTH AT BEDTIME AS NEEDED  30 tablet  0    Allergies  Allergen Reactions  . Doxycycline Monohydrate     REACTION: Vomitting  . Ramipril     REACTION: worsening renal function    Past Medical History  Diagnosis Date  . History of diverticulitis of colon   . HLD (hyperlipidemia)   . Anxiety   . OP (osteoporosis)   . Depression   . GERD (gastroesophageal reflux disease)   . IBS (irritable bowel syndrome)   . Glucose intolerance (impaired glucose tolerance)   . Allergic rhinitis   . Migraine   . HTN (hypertension)     Dr. Martinique, cards  . SUI (stress urinary incontinence, female)     Dr. Amalia Hailey, urology (some urge as well)  . Diverticulosis     colonsocopy 2002 aborted 2/2 tortuous colon and heavy sigmoid diverticulosis  . Dysphagia     EGD 2012 - wnl, s/p esoph dilation  . CKD (chronic kidney  disease) stage 3, GFR 30-59 ml/min   . Diastolic dysfunction     per echo in November 2012; normal EF    Past Surgical History  Procedure Date  . Tonsillectomy 1942  . Submucous resection 1962  . Tubal ligation 1973  . Breast enhancement surgery 1982  . Total abdominal hysterectomy 1996    for frequent dysmennorhea  . Macroplastique implantation 03/2008    Dr. Rogers Blocker  . Uretherolysis and removal of macroplastique 09/13/08    Dr. Amalia Hailey  . Ct head limited w/o cm 04/2010    nothing acute, ? old infarcts L internal capsule  . Renal doppler 2009?    simple cysts, wnl  . Colonoscopy 2002    does not want another!  . Esophagogastroduodenoscopy 2012    WNL, s/p esoph dilation (Dr. Candace Cruise, [facility])  . US echocardiography 11/2010    EF 55-60%, mild LAE, mild diastolic dysfunction, normal valves    History  Smoking status  . Former Smoker  . Types: Cigarettes  . Start date: 01/18/1954  . Quit date: 01/19/2008   Smokeless tobacco  . Never Used    History  Alcohol Use  . Yes    Comment: 1 1/2-2 glasses white wine/day    Family History  Problem Relation Age of Onset  . Heart failure Father   . Heart disease Father   . Cancer Father     prostate  . Hypertension Father   . Coronary artery disease Father   . Leukemia Mother     CLL prior    Review of Systems: The review of systems is per the HPI.  All other systems were reviewed and are negative.  Physical Exam: BP 126/66  Pulse 62  Wt 133 lb (60.328 kg) Patient is very pleasant and in no acute distress. Skin is warm and dry. Color is normal.  HEENT is unremarkable. Normocephalic/atraumatic. PERRL. Sclera are nonicteric. Neck is supple. No masses. No JVD. Lungs are clear. Cardiac exam shows a regular rate and rhythm. Abdomen is soft. Extremities are without edema. Gait and ROM are intact. No gross neurologic deficits noted.   LABORATORY DATA:   Assessment / Plan: 1. Hypertension. Patient has multiple drug intolerances as listed. She is doing much better now on Cardura with a reduction in her hydralazine dose. We will go ahead and reduce her hydralazine dose to 12.5 mg 3 times a day. I'll followup again in 3 months. If her blood pressure continues to do well we may stop the hydralazine completely.  2. Edema. Improved  3. Chronic kidney disease stage II.  4. Chronic anemia.

## 2012-01-11 ENCOUNTER — Other Ambulatory Visit: Payer: Self-pay | Admitting: Family Medicine

## 2012-01-11 NOTE — Telephone Encounter (Signed)
plz phone in. 

## 2012-01-13 ENCOUNTER — Other Ambulatory Visit: Payer: Self-pay | Admitting: Family Medicine

## 2012-01-13 NOTE — Telephone Encounter (Signed)
Pt called refill not available at pharmacy. Spoke with Arbie Cookey at [facility] Medication phoned to pharmacy as instructed. Pt notified while on phone.

## 2012-01-13 NOTE — Telephone Encounter (Signed)
Rx's called in as directed.  

## 2012-01-19 DIAGNOSIS — J449 Chronic obstructive pulmonary disease, unspecified: Secondary | ICD-10-CM

## 2012-01-19 HISTORY — DX: Chronic obstructive pulmonary disease, unspecified: J44.9

## 2012-01-21 ENCOUNTER — Other Ambulatory Visit: Payer: Self-pay | Admitting: Family Medicine

## 2012-01-21 NOTE — Telephone Encounter (Signed)
Rx called in as directed.   

## 2012-01-21 NOTE — Telephone Encounter (Signed)
plz phone in. 

## 2012-01-31 ENCOUNTER — Ambulatory Visit (INDEPENDENT_AMBULATORY_CARE_PROVIDER_SITE_OTHER): Payer: Medicare Other | Admitting: Family Medicine

## 2012-01-31 ENCOUNTER — Encounter: Payer: Self-pay | Admitting: Family Medicine

## 2012-01-31 VITALS — BP 140/70 | HR 56 | Temp 97.7°F | Ht 62.5 in | Wt 133.8 lb

## 2012-01-31 DIAGNOSIS — R0989 Other specified symptoms and signs involving the circulatory and respiratory systems: Secondary | ICD-10-CM | POA: Diagnosis not present

## 2012-01-31 DIAGNOSIS — J449 Chronic obstructive pulmonary disease, unspecified: Secondary | ICD-10-CM | POA: Diagnosis not present

## 2012-01-31 MED ORDER — ALBUTEROL SULFATE (2.5 MG/3ML) 0.083% IN NEBU
2.5000 mg | INHALATION_SOLUTION | Freq: Once | RESPIRATORY_TRACT | Status: AC
  Administered 2012-01-31: 2.5 mg via RESPIRATORY_TRACT

## 2012-01-31 MED ORDER — FORMOTEROL FUMARATE 12 MCG IN CAPS: 12.0000 ug | ORAL_CAPSULE | Freq: Two times a day (BID) | RESPIRATORY_TRACT | Status: DC

## 2012-01-31 NOTE — Progress Notes (Signed)
  Subjective:    Patient ID: Nicole English, female    DOB: 17-Oct-1932, 79 y.o.   MRN: [id number]  HPI CC: spirometry  I brought Mrs. Puthoff back today to undergo spirometry today to further evaluation her dyspnea on exertion.  H/o hyperinflated lungs on xray - as well as h/o smoking (quit 2010).  Endorses about 27 PY hx.  Denies recent fevers/chills, recent cough, wheezing.    CXR 08/2011 (last ER visit) - hyperinflation.  Ex smoker - states was only social smoker, never heavy.  Denies cough, wheezing.  + shortness of breath present at baseline, most noticeable with exertion. Has never used inhaler or breathing medicine in past.  Past Medical History  Diagnosis Date  . History of diverticulitis of colon   . HLD (hyperlipidemia)   . Anxiety   . OP (osteoporosis)   . Depression   . GERD (gastroesophageal reflux disease)   . IBS (irritable bowel syndrome)   . Glucose intolerance (impaired glucose tolerance)   . Allergic rhinitis   . Migraine   . HTN (hypertension)     Dr. Martinique, cards  . SUI (stress urinary incontinence, female)     Dr. Amalia Hailey, urology (some urge as well)  . Diverticulosis     colonsocopy 2002 aborted 2/2 tortuous colon and heavy sigmoid diverticulosis  . Dysphagia     EGD 2012 - wnl, s/p esoph dilation  . CKD (chronic kidney disease) stage 3, GFR 30-59 ml/min   . Diastolic dysfunction     per echo in November 2012; normal EF     Review of Systems Per HPI    Objective:   Physical Exam  Nursing note and vitals reviewed. Constitutional: She appears well-developed and well-nourished. No distress.  Cardiovascular: Normal rate, regular rhythm, normal heart sounds and intact distal pulses.   No murmur heard. Pulmonary/Chest: Effort normal. No respiratory distress. She has wheezes (faint diffuse end expiratory wheezing). She has no rales.  Skin: Skin is warm and dry. No rash noted.  Psychiatric: She has a normal mood and affect.       Assessment & Plan:

## 2012-01-31 NOTE — Assessment & Plan Note (Addendum)
Spirometry consistent with severe obstruction and low vital capacity. No h/o recurrent respiratory infections, but does endorse baseline dyspnea. Anticipate diagnosis will be emphysema - discussed this as well as treatment options.  Reviewed red flags suggesting COPD exac or infection. Recommended trial of foradil LABA for next month to see if symptomatic improvement in SOB.  Pt agrees with plan. Already quit smoking about 4 yrs ago. Educational material provided - pt instruction handout and Emmi soln prescription for COPD.

## 2012-01-31 NOTE — Patient Instructions (Addendum)
I am suspicious for COPD given spirometry results. Trial of foradil for the next month. Let me know how you are doing on this new medicine.  Give me a call with an update in next few weeks.  Chronic Obstructive Pulmonary Disease Chronic obstructive pulmonary disease (COPD) is a condition in which airflow from the lungs is restricted. The lungs can never return to normal, but there are measures you can take which will improve them and make you feel better. CAUSES   Smoking.  Exposure to secondhand smoke.  Breathing in irritants (pollution, cigarette smoke, strong smells, aerosol sprays, paint fumes).  History of lung infections. TREATMENT  Treatment focuses on making you comfortable (supportive care). Your caregiver may prescribe medications (inhaled or pills) to help improve your breathing. HOME CARE INSTRUCTIONS   If you smoke, stop smoking.  Avoid exposure to smoke, chemicals, and fumes that aggravate your breathing.  Take antibiotic medicines as directed by your caregiver.  Avoid medicines that dry up your system and slow down the elimination of secretions (antihistamines and cough syrups). This decreases respiratory capacity and may lead to infections.  Drink enough water and fluids to keep your urine clear or pale yellow. This loosens secretions.  Use humidifiers at home and at your bedside if they do not make breathing difficult.  Receive all protective vaccines your caregiver suggests, especially pneumococcal and influenza.  Use home oxygen as suggested.  Stay active. Exercise and physical activity will help maintain your ability to do things you want to do.  Eat a healthy diet. SEEK MEDICAL CARE IF:   You develop pus-like mucus (sputum).  Breathing is more labored or exercise becomes difficult to do.  You are running out of the medicine you take for your breathing. SEEK IMMEDIATE MEDICAL CARE IF:   You have a rapid heart rate.  You have agitation, confusion,  tremors, or are in a stupor (family members may need to observe this).  It becomes difficult to breathe.  You develop chest pain.  You have a fever. MAKE SURE YOU:   Understand these instructions.  Will watch your condition.  Will get help right away if you are not doing well or get worse. Document Released: 10/14/2004 Document Revised: 03/29/2011 Document Reviewed: 03/06/2010 [facility] Patient Information 2013 [place].

## 2012-02-07 ENCOUNTER — Other Ambulatory Visit: Payer: Self-pay | Admitting: Cardiology

## 2012-02-10 ENCOUNTER — Other Ambulatory Visit: Payer: Self-pay | Admitting: Family Medicine

## 2012-02-10 NOTE — Telephone Encounter (Signed)
plz phone in alprazolam

## 2012-02-11 ENCOUNTER — Other Ambulatory Visit: Payer: Self-pay | Admitting: Family Medicine

## 2012-02-11 NOTE — Telephone Encounter (Signed)
Rx called in as directed.   

## 2012-02-12 NOTE — Telephone Encounter (Signed)
plz phone in. 

## 2012-02-14 ENCOUNTER — Other Ambulatory Visit: Payer: Self-pay | Admitting: Family Medicine

## 2012-02-14 NOTE — Telephone Encounter (Signed)
Rx called in as directed.   

## 2012-02-21 ENCOUNTER — Other Ambulatory Visit: Payer: Self-pay | Admitting: Cardiology

## 2012-02-21 MED ORDER — HYDRALAZINE HCL 25 MG PO TABS: 12.5000 mg | ORAL_TABLET | Freq: Three times a day (TID) | ORAL | Status: DC

## 2012-02-21 NOTE — Telephone Encounter (Signed)
Spoke to patient she stated she slept through morning dose of hydralazine.Stated she took dose as soon as she thought of it and got all 3 doses in.Advised if  misses a dose dont take extra,resume as normal.Stated she wanted hydralazine 25 mg tablets instead of 50 mg.Also stated she has had shoulder pain.Stated she took alprazolam and hydrocodone.Advised to rest and if shoulder pain continues to call back.

## 2012-02-21 NOTE — Telephone Encounter (Signed)
Pt needs refill for hydralazine 50mg  was reduced to 25mg  cutting in half, then reduced again cutting in fourths, so taking 12.5 mg, uses  [facility] [address]

## 2012-02-21 NOTE — Telephone Encounter (Signed)
Calling patient to verify hydralazine rx. She prefers to cut a 25mg  in half.  While asking dosage preference she states yesterday she slept through  First dosage of BP medication. She took medication immediately then took next dose on time. But now her BP is up(161/88) and down(129/63) and she is having chest discomfort. She feels this may be from her missing her first dose yesterday. She has only took it once today but is not feeling good still with chest discomfort and pain. She wants Dr Doug Sou nurse to call her back about this to see if she should take Hydralazine 12.5mg  three times a day or go back to 25mg  three times a day. I will put in the rx for 25mg  taking12.5 mg three times a day but will not sign just in case nurse decides to change it when calling Ms Viernes back.    Marlis Edelson CMA

## 2012-02-23 ENCOUNTER — Telehealth: Payer: Self-pay | Admitting: Physician Assistant

## 2012-02-23 NOTE — Telephone Encounter (Signed)
Patient called the answering service re: elevated BP and question regarding meds. She has taken her antihypertensives as prescribed. Her BP this evening is 170s/80s, but have otherwise been well-controlled. She takes hydralazine TID and Cardura at bedtime. She takes the evening hydralazine and Cardura doses at the same time. Advised to take hydralazine now, and Cardura at bedtime to provide more continuous BP control. She will take hydralazine in the evening 2-3 hours before Cardura, and continued to log her BP readings.    Jacquelynn Cree, PA-C 02/23/2012 7:02 PM

## 2012-02-27 ENCOUNTER — Other Ambulatory Visit: Payer: Self-pay | Admitting: Family Medicine

## 2012-02-27 NOTE — Telephone Encounter (Signed)
plz phone in. 

## 2012-02-28 ENCOUNTER — Other Ambulatory Visit: Payer: Self-pay | Admitting: Family Medicine

## 2012-02-28 ENCOUNTER — Telehealth: Payer: Self-pay | Admitting: Cardiology

## 2012-02-28 NOTE — Telephone Encounter (Signed)
Spoke with pt, for the last week she has had elevated bp, ranging from 178-132/ 99-87. Her cardura is 2 mg at hs, bystolic 5 mg bid and hydralazine 50 mg 1/4 tablet tid. She is having headaches and generally feeling bad. She wonders if medicine needs to be adjusted. Will forward for dr Martinique review.

## 2012-02-28 NOTE — Telephone Encounter (Signed)
I would increase cardura to 4 mg Qhs.  Monaye Blackie Martinique MD, [facility]

## 2012-02-28 NOTE — Telephone Encounter (Signed)
New problem    C/O high blood pressure. Should adjustment in medication be made.

## 2012-02-28 NOTE — Telephone Encounter (Signed)
Donnie pharmacist [facility] left message for Nicole English for future reference Vicodin is 5-300 mg and Norco is 5-325 mg.Pt has gotten Norco in the past. Donnie did not request call back.

## 2012-02-28 NOTE — Telephone Encounter (Signed)
Pt left v/m requesting status of Hydrocodone refill. Advised pt via v/m  med called in this AM and to ck with pharmacy.

## 2012-02-28 NOTE — Telephone Encounter (Signed)
Rx called in as directed.   

## 2012-02-29 MED ORDER — DOXAZOSIN MESYLATE 4 MG PO TABS: 4.0000 mg | ORAL_TABLET | Freq: Every day | ORAL | Status: DC

## 2012-02-29 NOTE — Telephone Encounter (Signed)
Spoke with pt, aware of dr jordan's recommendations. 

## 2012-02-29 NOTE — Addendum Note (Signed)
Addended by: Cristopher Estimable on: 02/29/2012 09:31 AM   Modules accepted: Orders

## 2012-03-13 ENCOUNTER — Other Ambulatory Visit: Payer: Self-pay | Admitting: Family Medicine

## 2012-03-13 NOTE — Telephone Encounter (Signed)
Rx called in as directed.   

## 2012-03-13 NOTE — Telephone Encounter (Signed)
plz phone in. 

## 2012-03-14 ENCOUNTER — Other Ambulatory Visit: Payer: Self-pay | Admitting: Family Medicine

## 2012-03-14 DIAGNOSIS — Z1231 Encounter for screening mammogram for malignant neoplasm of breast: Secondary | ICD-10-CM

## 2012-03-23 ENCOUNTER — Emergency Department (HOSPITAL_COMMUNITY): Payer: Medicare Other

## 2012-03-23 ENCOUNTER — Emergency Department (HOSPITAL_COMMUNITY)
Admission: EM | Admit: 2012-03-23 | Discharge: 2012-03-23 | Disposition: A | Discharge status: Home / Self Care | Payer: Medicare Other | Attending: Emergency Medicine | Admitting: Emergency Medicine

## 2012-03-23 ENCOUNTER — Telehealth: Payer: Self-pay | Admitting: Family Medicine

## 2012-03-23 ENCOUNTER — Encounter (HOSPITAL_COMMUNITY): Payer: Self-pay | Admitting: Emergency Medicine

## 2012-03-23 DIAGNOSIS — R5381 Other malaise: Secondary | ICD-10-CM | POA: Insufficient documentation

## 2012-03-23 DIAGNOSIS — Z87891 Personal history of nicotine dependence: Secondary | ICD-10-CM | POA: Diagnosis not present

## 2012-03-23 DIAGNOSIS — Z7982 Long term (current) use of aspirin: Secondary | ICD-10-CM | POA: Diagnosis not present

## 2012-03-23 DIAGNOSIS — Z862 Personal history of diseases of the blood and blood-forming organs and certain disorders involving the immune mechanism: Secondary | ICD-10-CM | POA: Diagnosis not present

## 2012-03-23 DIAGNOSIS — R55 Syncope and collapse: Secondary | ICD-10-CM | POA: Diagnosis not present

## 2012-03-23 DIAGNOSIS — R001 Bradycardia, unspecified: Secondary | ICD-10-CM

## 2012-03-23 DIAGNOSIS — I498 Other specified cardiac arrhythmias: Secondary | ICD-10-CM | POA: Insufficient documentation

## 2012-03-23 DIAGNOSIS — R5383 Other fatigue: Secondary | ICD-10-CM

## 2012-03-23 DIAGNOSIS — Z8739 Personal history of other diseases of the musculoskeletal system and connective tissue: Secondary | ICD-10-CM | POA: Diagnosis not present

## 2012-03-23 DIAGNOSIS — K5900 Constipation, unspecified: Secondary | ICD-10-CM | POA: Insufficient documentation

## 2012-03-23 DIAGNOSIS — F3289 Other specified depressive episodes: Secondary | ICD-10-CM | POA: Insufficient documentation

## 2012-03-23 DIAGNOSIS — F329 Major depressive disorder, single episode, unspecified: Secondary | ICD-10-CM | POA: Insufficient documentation

## 2012-03-23 DIAGNOSIS — Z8719 Personal history of other diseases of the digestive system: Secondary | ICD-10-CM | POA: Diagnosis not present

## 2012-03-23 DIAGNOSIS — I129 Hypertensive chronic kidney disease with stage 1 through stage 4 chronic kidney disease, or unspecified chronic kidney disease: Secondary | ICD-10-CM | POA: Diagnosis not present

## 2012-03-23 DIAGNOSIS — N183 Chronic kidney disease, stage 3 unspecified: Secondary | ICD-10-CM | POA: Diagnosis not present

## 2012-03-23 DIAGNOSIS — Z8639 Personal history of other endocrine, nutritional and metabolic disease: Secondary | ICD-10-CM | POA: Insufficient documentation

## 2012-03-23 DIAGNOSIS — F411 Generalized anxiety disorder: Secondary | ICD-10-CM | POA: Insufficient documentation

## 2012-03-23 DIAGNOSIS — K219 Gastro-esophageal reflux disease without esophagitis: Secondary | ICD-10-CM | POA: Diagnosis not present

## 2012-03-23 DIAGNOSIS — R4182 Altered mental status, unspecified: Secondary | ICD-10-CM | POA: Diagnosis not present

## 2012-03-23 DIAGNOSIS — Z87448 Personal history of other diseases of urinary system: Secondary | ICD-10-CM | POA: Diagnosis not present

## 2012-03-23 DIAGNOSIS — Z8679 Personal history of other diseases of the circulatory system: Secondary | ICD-10-CM | POA: Diagnosis not present

## 2012-03-23 DIAGNOSIS — Z79899 Other long term (current) drug therapy: Secondary | ICD-10-CM | POA: Diagnosis not present

## 2012-03-23 LAB — URINALYSIS, ROUTINE W REFLEX MICROSCOPIC
Glucose, UA: NEGATIVE mg/dL
Hgb urine dipstick: NEGATIVE
Protein, ur: 100 mg/dL — AB
pH: 6 (ref 5.0–8.0)

## 2012-03-23 LAB — URINE MICROSCOPIC-ADD ON

## 2012-03-23 LAB — BASIC METABOLIC PANEL
CO2: 21 mEq/L (ref 19–32)
Calcium: 9.8 mg/dL (ref 8.4–10.5)
Creatinine, Ser: 1.59 mg/dL — ABNORMAL HIGH (ref 0.50–1.10)
Glucose, Bld: 97 mg/dL (ref 70–99)
Sodium: 140 mEq/L (ref 135–145)

## 2012-03-23 LAB — CBC WITH DIFFERENTIAL/PLATELET
Eosinophils Relative: 5 % (ref 0–5)
HCT: 33.8 % — ABNORMAL LOW (ref 36.0–46.0)
Lymphocytes Relative: 33 % (ref 12–46)
Lymphs Abs: 2.2 10*3/uL (ref 0.7–4.0)
MCV: 90.9 fL (ref 78.0–100.0)
Monocytes Absolute: 0.6 10*3/uL (ref 0.1–1.0)
RBC: 3.72 MIL/uL — ABNORMAL LOW (ref 3.87–5.11)
WBC: 6.8 10*3/uL (ref 4.0–10.5)

## 2012-03-23 MED ORDER — DIAZEPAM 2 MG PO TABS
2.0000 mg | ORAL_TABLET | Freq: Once | ORAL | Status: AC
  Administered 2012-03-23: 2 mg via ORAL
  Filled 2012-03-23: qty 1

## 2012-03-23 NOTE — ED Provider Notes (Signed)
History     CSN: [id number]  Arrival date & time 03/23/12  97   First MD Initiated Contact with Patient 03/23/12 1659      Chief Complaint  Patient presents with  . Fatigue    (Consider location/radiation/quality/duration/timing/severity/associated sxs/prior treatment) HPI Comments: 79 y.o. Female presents with a series of blackouts and falls the details of which she cannot remember for the last week. Pt is unsure if she lost consciousness or just does not remember. Pt states her 72-year old granddaughter told her she was not putting sentences together correctly and found her on the floor in her bedroom during a visit.   Pt states she is "not feeling herself" for the last week. Admits generalized weakness, lethargy, a desire not to get dressed at home (pt arrived in her bathrobe).   Denies chest pain, shortness of breath, diff breathing, headache, visual disturbances.   Called Dr. Danise [name] today to tell him the story, spoke to the nurse, who told her to come to the hospital.   PMHx significant for CKD, COPD, anemia, depression, and anxiety.    Past Medical History  Diagnosis Date  . History of diverticulitis of colon   . HLD (hyperlipidemia)   . Anxiety   . OP (osteoporosis)   . Depression   . GERD (gastroesophageal reflux disease)   . IBS (irritable bowel syndrome)   . Glucose intolerance (impaired glucose tolerance)   . Allergic rhinitis   . Migraine   . HTN (hypertension)     Dr. Martinique, cards  . SUI (stress urinary incontinence, female)     Dr. Amalia Hailey, urology (some urge as well)  . Diverticulosis     colonsocopy 2002 aborted 2/2 tortuous colon and heavy sigmoid diverticulosis  . Dysphagia     EGD 2012 - wnl, s/p esoph dilation  . CKD (chronic kidney disease) stage 3, GFR 30-59 ml/min   . Diastolic dysfunction     per echo in November 2012; normal EF    Past Surgical History  Procedure Laterality Date  . Tonsillectomy  1942  . Submucous resection  1962  .  Tubal ligation  1973  . Breast enhancement surgery  1982  . Total abdominal hysterectomy  1996    for frequent dysmennorhea  . Macroplastique implantation  03/2008    Dr. Rogers Blocker  . Uretherolysis and removal of macroplastique  09/13/08    Dr. Amalia Hailey  . Ct head limited w/o cm  04/2010    nothing acute, ? old infarcts L internal capsule  . Renal doppler  2009?    simple cysts, wnl  . Colonoscopy  2002    does not want another!  . Esophagogastroduodenoscopy  2012    WNL, s/p esoph dilation (Dr. Candace Cruise, [facility])  . US echocardiography  11/2010    EF 55-60%, mild LAE, mild diastolic dysfunction, normal valves    Family History  Problem Relation Age of Onset  . Heart failure Father   . Heart disease Father   . Cancer Father     prostate  . Hypertension Father   . Coronary artery disease Father   . Leukemia Mother     CLL prior    History  Substance Use Topics  . Smoking status: Former Smoker -- 0.50 packs/day for 54 years    Types: Cigarettes    Start date: 01/18/1954    Quit date: 01/19/2008  . Smokeless tobacco: Never Used  . Alcohol Use: Yes     Comment: 1 1/2-2 glasses white  wine/day    OB History   Grav Para Term Preterm Abortions TAB SAB Ect Mult Living                  Review of Systems  Constitutional: Negative for fever and diaphoresis.  HENT: Negative for neck pain and neck stiffness.   Eyes: Negative for visual disturbance.  Respiratory: Negative for apnea, chest tightness and shortness of breath.   Cardiovascular: Negative for chest pain and palpitations.  Gastrointestinal: Positive for constipation. Negative for nausea, vomiting and diarrhea.       Mild constipation.  Genitourinary: Negative for dysuria.  Musculoskeletal: Negative for gait problem.  Skin: Negative for rash.  Neurological: Negative for dizziness, weakness, light-headedness, numbness and headaches.    Allergies  Doxycycline monohydrate and Ramipril  Home Medications   Current Outpatient  Rx  Name  Route  Sig  Dispense  Refill  . ALPRAZolam (XANAX) 0.25 MG tablet   Oral   Take 0.25 mg by mouth 3 (three) times daily as needed for sleep or anxiety.         Marland Kitchen aspirin EC 81 MG tablet   Oral   Take 81 mg by mouth daily.         Marland Kitchen b complex vitamins tablet   Oral   Take 2 tablets by mouth daily.         . cholecalciferol (VITAMIN D) 1000 UNITS tablet   Oral   Take 1,000 Units by mouth daily.           . Coenzyme Q10 (COQ10) 50 MG CAPS   Oral   Take 1 capsule by mouth daily.           Marland Kitchen doxazosin (CARDURA) 4 MG tablet   Oral   Take 1 tablet (4 mg total) by mouth at bedtime.   30 tablet   6   . furosemide (LASIX) 20 MG tablet   Oral   Take 10 mg by mouth daily as needed (swelling).         . gabapentin (NEURONTIN) 100 MG capsule   Oral   Take 100 mg by mouth 3 (three) times daily.         . hydrALAZINE (APRESOLINE) 25 MG tablet   Oral   Take 0.5 tablets (12.5 mg total) by mouth 3 (three) times daily.   45 tablet   6   . HYDROcodone-acetaminophen (NORCO/VICODIN) 5-325 MG per tablet   Oral   Take 1 tablet by mouth every 6 (six) hours as needed for pain.         . nebivolol (BYSTOLIC) 5 MG tablet   Oral   Take 10 mg by mouth daily.         . NON FORMULARY   Oral   Take 1 tablet by mouth 4 (four) times daily. Sea calcium         . omeprazole (PRILOSEC) 20 MG capsule   Oral   Take 20 mg by mouth daily as needed. For reflux         . zolpidem (AMBIEN) 10 MG tablet   Oral   Take 10 mg by mouth at bedtime as needed for sleep.           BP 154/63  Pulse 55  Temp(Src) 98.5 F (36.9 C) (Oral)  Resp 14  SpO2 98%  Physical Exam  Nursing note and vitals reviewed. Constitutional: She is oriented to person, place, and time. She appears well-developed and well-nourished. No  distress.  HENT:  Head: Normocephalic and atraumatic.  Eyes: Conjunctivae and EOM are normal. Pupils are equal, round, and reactive to light.  Neck:  Normal range of motion. Neck supple.  No meningeal signs  Cardiovascular: Regular rhythm, normal heart sounds and intact distal pulses.  Exam reveals no gallop and no friction rub.   No murmur heard. Pt bradycardic during exam, asymptomatic.   Pulmonary/Chest: Effort normal and breath sounds normal. No respiratory distress. She has no wheezes. She has no rales. She exhibits no tenderness.  Abdominal: Soft. Bowel sounds are normal. She exhibits no distension. There is no tenderness. There is no rebound and no guarding.  Musculoskeletal: Normal range of motion. She exhibits no edema and no tenderness.  5/5 strength throughout. Good ROM  Neurological: She is alert and oriented to person, place, and time. No cranial nerve deficit.  No focal deficits, sensation to light touch intact.  Skin: Skin is warm and dry. She is not diaphoretic. No erythema.  Psychiatric:  dysphoric mood, perseverating on irrelevant details    ED Course  Procedures (including critical care time) Results for orders placed during the hospital encounter of 03/23/12  CBC WITH DIFFERENTIAL      Result Value Range   WBC 6.8  4.0 - 10.5 K/uL   RBC 3.72 (*) 3.87 - 5.11 MIL/uL   Hemoglobin 11.3 (*) 12.0 - 15.0 g/dL   HCT 33.8 (*) 36.0 - 46.0 %   MCV 90.9  78.0 - 100.0 fL   MCH 30.4  26.0 - 34.0 pg   MCHC 33.4  30.0 - 36.0 g/dL   RDW 13.2  11.5 - 15.5 %   Platelets 246  150 - 400 K/uL   Neutrophils Relative 52  43 - 77 %   Neutro Abs 3.5  1.7 - 7.7 K/uL   Lymphocytes Relative 33  12 - 46 %   Lymphs Abs 2.2  0.7 - 4.0 K/uL   Monocytes Relative 9  3 - 12 %   Monocytes Absolute 0.6  0.1 - 1.0 K/uL   Eosinophils Relative 5  0 - 5 %   Eosinophils Absolute 0.3  0.0 - 0.7 K/uL   Basophils Relative 1  0 - 1 %   Basophils Absolute 0.1  0.0 - 0.1 K/uL  BASIC METABOLIC PANEL      Result Value Range   Sodium 140  135 - 145 mEq/L   Potassium 4.4  3.5 - 5.1 mEq/L   Chloride 109  96 - 112 mEq/L   CO2 21  19 - 32 mEq/L    Glucose, Bld 97  70 - 99 mg/dL   BUN 33 (*) 6 - 23 mg/dL   Creatinine, Ser 1.59 (*) 0.50 - 1.10 mg/dL   Calcium 9.8  8.4 - 10.5 mg/dL   GFR calc non Af Amer 30 (*) >90 mL/min   GFR calc Af Amer 35 (*) >90 mL/min  URINALYSIS, ROUTINE W REFLEX MICROSCOPIC      Result Value Range   Color, Urine YELLOW  YELLOW   APPearance CLEAR  CLEAR   Specific Gravity, Urine 1.009  1.005 - 1.030   pH 6.0  5.0 - 8.0   Glucose, UA NEGATIVE  NEGATIVE mg/dL   Hgb urine dipstick NEGATIVE  NEGATIVE   Bilirubin Urine NEGATIVE  NEGATIVE   Ketones, ur NEGATIVE  NEGATIVE mg/dL   Protein, ur 100 (*) NEGATIVE mg/dL   Urobilinogen, UA 0.2  0.0 - 1.0 mg/dL   Nitrite NEGATIVE  NEGATIVE  Leukocytes, UA NEGATIVE  NEGATIVE  URINE MICROSCOPIC-ADD ON      Result Value Range   Squamous Epithelial / LPF RARE  RARE   WBC, UA 0-2  <3 WBC/hpf   Urine-Other MUCOUS PRESENT      Ct Head Wo Contrast  03/23/2012  *RADIOLOGY REPORT*  Clinical Data: Altered mental status  CT HEAD WITHOUT CONTRAST  Technique:  Contiguous axial images were obtained from the base of the skull through the vertex without contrast.  Comparison: None.  Findings: No acute intracranial hemorrhage.  No focal mass lesion. No CT evidence of acute infarction.   No midline shift or mass effect.  No hydrocephalus.  Basilar cisterns are patent.  Paranasal sinuses and mastoid air cells are clear.  Orbits are normal.  IMPRESSION: Normal head CT for age.   Original Report Authenticated By: Suzy Bouchard, M.D.    Labs Reviewed - No data to display No results found.   Date: 03/24/2012  Rate: 53  Rhythm: sinus rhythm  QRS Axis: normal  Intervals: normal  ST/T Wave abnormalities: normal  Conduction Disutrbances: none  Narrative Interpretation: abnormal EKG  Old EKG Reviewed: None available  Diagnosis: fatigue, bradycardia.    MDM  Consider head CT since pt seems to be "blacking out" and can't remember hitting her head. No explanation for these blackouts.   Gave valium to help reduce patient's anxiety.   Pt comfortable on re-evaluation and in no acute distress.   Labs were unremarkable. EKG showed sinus rhythm. CT normal.   At this time there does not appear to be any evidence of an acute emergency medical condition and the patient appears stable for discharge with appropriate outpatient follow up with primary care doctor. Diagnosis was discussed with patient who verbalizes understanding and is agreeable to discharge. Pt case discussed with Dr.Kohut who agrees with my plan.      Coralee North, PA-C 03/24/12 0104

## 2012-03-23 NOTE — Telephone Encounter (Addendum)
This happened 6 days ago.  Does not need ER evaluation, I could eval tomorrow if she can come into office.  To ER if any change overnight.

## 2012-03-23 NOTE — ED Notes (Signed)
Last Wednesday pt remembers cooking a potato but does not remember eating it- states the next morning she found pieces of the potato on the floor but never found the potato and doesn't remember eating it.  On Thursday pt grandaughter found pt on floor and pt didn't remember falling.  Later that day her granddaughter said that pt was not making sense when she was talking-pt has no memory of that.  Friday through today pt does not remember having anymore of these episodes.  Pt denies any pain but has been fatigued.  Pt called PCP and was told to come to ED for evaluation of a TIA.  Pt A&O X 4 upon arrival to pt room.

## 2012-03-23 NOTE — Telephone Encounter (Signed)
Checked chart and she is already at the ER. Will call tomorrow for update.

## 2012-03-23 NOTE — Telephone Encounter (Signed)
Patient Information:  [name] Name: Taite  Phone: [phone]  Patient: Nicole English, Nicole English  Gender: Female  DOB: [birth year]  Age: 79 Years  PCP: Ria Bush [facility])  Office Follow Up:  Does the office need to follow up with this patient?: No  Instructions For The Office: N/A  RN Note:  Reported "incident" 03/17/12; 67 yr old grandaughter found her sitting on floor with difficulty speaking.  Chair was overturned. Also had one episode of "hallucinating" approximately 03/15/12 with memory gaps. Was unable to recall dentist name when asked this morning. Has mild frontal headache for past week. Reports does not want to dress or stand; prefers to lie down; feels like walking "is tilted" at times, but not present today. No appointments remain in office; advised to go to [facility] ED.  Symptoms  Reason For Call & Symptoms: Grandaughter instructed her to call to report "incident" 03/17/12.  Found her sitting on the floor and her words "did not come out right."  She was unaware of the difficulty speaking.  Reviewed Health History In EMR: Yes  Reviewed Medications In EMR: Yes  Reviewed Allergies In EMR: Yes  Reviewed Surgeries / Procedures: Yes  Date of Onset of Symptoms: 03/17/2012  Guideline(s) Used:  Neurologic Deficit  Disposition Per Guideline:   Go to ED Now (or to Office with PCP Approval)  Reason For Disposition Reached:   Patient sounds very sick or weak to the triager  Advice Given:  N/A

## 2012-03-23 NOTE — ED Notes (Addendum)
Pt st's last Fri she was told by her family that she was not acting right, could not make sentences.  St's today when she told her MD this she was instructed to come to ED.  Pt st's since last Fri she has felt very tired, not sleeping well and "just not feeling good". Pt alert and oriented x's 3 at this time

## 2012-03-27 NOTE — ED Provider Notes (Signed)
Medical screening examination/treatment/procedure(s) were conducted as a shared visit with non-physician practitioner(s) and myself.  I personally evaluated the patient during the encounter.  80yf with what seems like presyncope. Pt not particularly good historian. Nonfocal neuro exam. Elba Barman fairly unremarkable. Mild bradycardia but pt asymptomatic in room when noted on monitor as well. Doubt etiology of symptoms. Some degree of depression may be with general disinterest. No SI. Low suspicion for emergent etiology.   Virgel Manifold, MD 03/27/12 1911

## 2012-03-29 ENCOUNTER — Ambulatory Visit (INDEPENDENT_AMBULATORY_CARE_PROVIDER_SITE_OTHER)
Admission: RE | Admit: 2012-03-29 | Discharge: 2012-03-29 | Disposition: A | Discharge status: Home / Self Care | Payer: Medicare Other | Source: Ambulatory Visit | Attending: Family Medicine | Admitting: Family Medicine

## 2012-03-29 ENCOUNTER — Encounter: Payer: Self-pay | Admitting: Family Medicine

## 2012-03-29 ENCOUNTER — Ambulatory Visit (INDEPENDENT_AMBULATORY_CARE_PROVIDER_SITE_OTHER): Payer: Medicare Other | Admitting: Family Medicine

## 2012-03-29 VITALS — BP 126/70 | HR 64 | Temp 98.1°F

## 2012-03-29 DIAGNOSIS — M5137 Other intervertebral disc degeneration, lumbosacral region: Secondary | ICD-10-CM | POA: Diagnosis not present

## 2012-03-29 DIAGNOSIS — IMO0002 Reserved for concepts with insufficient information to code with codable children: Secondary | ICD-10-CM | POA: Diagnosis not present

## 2012-03-29 DIAGNOSIS — M549 Dorsalgia, unspecified: Secondary | ICD-10-CM

## 2012-03-29 DIAGNOSIS — J449 Chronic obstructive pulmonary disease, unspecified: Secondary | ICD-10-CM | POA: Diagnosis not present

## 2012-03-29 DIAGNOSIS — J4489 Other specified chronic obstructive pulmonary disease: Secondary | ICD-10-CM

## 2012-03-29 DIAGNOSIS — G4700 Insomnia, unspecified: Secondary | ICD-10-CM | POA: Diagnosis not present

## 2012-03-29 DIAGNOSIS — R4182 Altered mental status, unspecified: Secondary | ICD-10-CM | POA: Diagnosis not present

## 2012-03-29 DIAGNOSIS — M47817 Spondylosis without myelopathy or radiculopathy, lumbosacral region: Secondary | ICD-10-CM | POA: Diagnosis not present

## 2012-03-29 DIAGNOSIS — G8929 Other chronic pain: Secondary | ICD-10-CM | POA: Insufficient documentation

## 2012-03-29 NOTE — Assessment & Plan Note (Signed)
Off foradil - too expensive.

## 2012-03-29 NOTE — Assessment & Plan Note (Addendum)
Large differential including orthostatic, overmedication, neurologic (CVA, seizure). Doubt bradycardia related, but may consider decreasing nebivolol. bp/hr stable today - no changes to antihypertensives. Pt denies any depression to me today. Doubt dementia related change. She is on multiple meds that could alter mentation - including her regular use of gabapentin, hydrocodone, alprazolam and ambien. Discussed this - start with decreasing ambien to max 5mg  nightly (she will cut in half).  May consider backing off hydrocodone and xanax. If persistent sxs, I asked her tor return for further evaluation - low threshold to obtain brain MRI looking for hidden signs of recurrent stroke.  Given nonfocal neurological exam today, did not obtain

## 2012-03-29 NOTE — Progress Notes (Addendum)
Subjective:    Patient ID: Nicole English, female    DOB: [birth year], 79 y.o.   MRN: [id number]  HPI CC: ER f/u  Nicole English presents today as ER follow up for recent episodes of memory loss.  ER records reivewed - EKG WNL except for sinus bradycardia, noncontrast head CT without acute finding, Cr 1.59, Hgb 11.3.  Workup unrevealing, discharged home after given valium x1.  States 19 yo granddaughter told her that on Friday 03/17/2012 she found pt on floor leaning next to her bed, and when asked why pt said she had fallen off computer desk.  Did notice bruising on thighs a few days afterwards.  Also told by grand daughter that she had trouble putting words together that day.  Pt has no recollection of this event.  On next day, traveled to [place] and driver mentioned she slumped in her chair which made her worried.  Pt endorses full recollection of meeting agenda/events and does'nt remember slumping in chair.  Endorses left posterior headache and some dizziness as well as intermittently feeling off balance with walking. Some fatigue with decreased energy and feelings of apathy this past week. Denies depression, more "worn out".  Denies SI/HI. Endorses constant pain in head as well as mid and lower spine and hip pain.  States this has been longstanding pain for years.  Attributes feeling like this 2/2 constant pain she's in.  Takes hydrocodone regularly - takes 1/2 pill four times a day.  Also on gabapentin 100mg  tid.  Has been told has arthritis in back in past, no recent evaluation for this.  Also on xanax and ambien 10mg  nightly regularly. Denies chest discomfort, hemiparesis, paresthesias.  Denies seizure actvity, LOC, denies confusion. No shooting pain down legs, bowel/bladder incontinence, no h/o cancer, no fevers.  CT head without contrast done 04/2010 - ?old L internal capsule infarcts  H/o difficult to control hypertension, sees Dr. Martinique.  On hydralazine 12.5mg  tid, doxazosin 4mg  nightly,  nebivolol 10mg  daily, and lasix 10mg  prn swelling  Past Medical History  Diagnosis Date  . History of diverticulitis of colon   . HLD (hyperlipidemia)   . Anxiety   . OP (osteoporosis)   . Depression   . GERD (gastroesophageal reflux disease)   . IBS (irritable bowel syndrome)   . Glucose intolerance (impaired glucose tolerance)   . Allergic rhinitis   . Migraine   . HTN (hypertension)     Dr. Martinique, cards  . SUI (stress urinary incontinence, female)     Dr. Amalia Hailey, urology (some urge as well)  . Diverticulosis     colonsocopy 2002 aborted 2/2 tortuous colon and heavy sigmoid diverticulosis  . Dysphagia     EGD 2012 - wnl, s/p esoph dilation  . CKD (chronic kidney disease) stage 3, GFR 30-59 ml/min   . Diastolic dysfunction     per echo in November 2012; normal EF    Past Surgical History  Procedure Laterality Date  . Tonsillectomy  1942  . Submucous resection  1962  . Tubal ligation  1973  . Breast enhancement surgery  1982  . Total abdominal hysterectomy  1996    for frequent dysmennorhea  . Macroplastique implantation  03/2008    Dr. Rogers Blocker  . Uretherolysis and removal of macroplastique  09/13/08    Dr. Amalia Hailey  . Ct head limited w/o cm  04/2010    nothing acute, ? old infarcts L internal capsule  . Renal doppler  2009?    simple cysts,  wnl  . Colonoscopy  2002    does not want another!  . Esophagogastroduodenoscopy  2012    WNL, s/p esoph dilation (Dr. Candace Cruise, [facility])  . US echocardiography  11/2010    EF 55-60%, mild LAE, mild diastolic dysfunction, normal valves   Current Outpatient Prescriptions on File Prior to Visit  Medication Sig Dispense Refill  . ALPRAZolam (XANAX) 0.25 MG tablet Take 0.25 mg by mouth 2 (two) times daily as needed for sleep or anxiety.       Marland Kitchen aspirin EC 81 MG tablet Take 81 mg by mouth daily.      Marland Kitchen b complex vitamins tablet Take 2 tablets by mouth daily.      . cholecalciferol (VITAMIN D) 1000 UNITS tablet Take 1,000 Units by mouth daily.         . Coenzyme Q10 (COQ10) 50 MG CAPS Take 1 capsule by mouth daily.        Marland Kitchen doxazosin (CARDURA) 4 MG tablet Take 1 tablet (4 mg total) by mouth at bedtime.  30 tablet  6  . furosemide (LASIX) 20 MG tablet Take 10 mg by mouth daily as needed (swelling).      . gabapentin (NEURONTIN) 100 MG capsule Take 100 mg by mouth 3 (three) times daily.      . hydrALAZINE (APRESOLINE) 25 MG tablet Take 0.5 tablets (12.5 mg total) by mouth 3 (three) times daily.  45 tablet  6  . HYDROcodone-acetaminophen (NORCO/VICODIN) 5-325 MG per tablet Take 1 tablet by mouth every 6 (six) hours as needed for pain.      . nebivolol (BYSTOLIC) 5 MG tablet Take 10 mg by mouth daily.      . NON FORMULARY Take 1 tablet by mouth 4 (four) times daily. Sea calcium      . omeprazole (PRILOSEC) 20 MG capsule Take 20 mg by mouth daily as needed. For reflux       No current facility-administered medications on file prior to visit.   Review of Systems Per HPI    Objective:   Physical Exam  Nursing note and vitals reviewed. Constitutional: She appears well-developed and well-nourished. No distress.  HENT:  Head: Normocephalic and atraumatic.  Right Ear: Hearing, tympanic membrane, external ear and ear canal normal.  Left Ear: Hearing, tympanic membrane, external ear and ear canal normal.  Mouth/Throat: Oropharynx is clear and moist. No oropharyngeal exudate.  Eyes: Conjunctivae and EOM are normal. Pupils are equal, round, and reactive to light. No scleral icterus.  Neck: Normal range of motion. Neck supple. Carotid bruit is not present.  Cardiovascular: Normal rate, regular rhythm, normal heart sounds and intact distal pulses.   No murmur heard. Pulmonary/Chest: Effort normal and breath sounds normal. No respiratory distress. She has no wheezes. She has no rales.  Musculoskeletal: She exhibits no edema.  Midline spine tenderness at mid thoracic spine and lower lumbar spine. No paraspinous mm tenderness. + SIJ pain. + L  GTB pain No pain at sciatic notch  Lymphadenopathy:    She has no cervical adenopathy.  Neurological: She is alert. She has normal strength. No cranial nerve deficit or sensory deficit. She displays a negative Romberg sign. Coordination and gait normal.  Reflex Scores:      Bicep reflexes are 2+ on the right side and 2+ on the left side.      Brachioradialis reflexes are 2+ on the right side and 2+ on the left side.      Patellar reflexes are 2+ on  the right side and 2+ on the left side.      Achilles reflexes are 2+ on the right side and 2+ on the left side. CN 2-12 intact 5/5 strength BUE/BLE No pronator drift normal FTN  Skin: Skin is warm and dry. No rash noted.  Psychiatric: She has a normal mood and affect.       Assessment & Plan:

## 2012-03-29 NOTE — Patient Instructions (Signed)
Let's back down on ambien to only 5mg  nightly as needed for sleep. xrays of lumbar and thoracic spine today - we will call you with results. Please return if any more episodes like this or persistent dizziness. CT scan at the ER was reassuring. Good to see you today, call us with questions.

## 2012-03-29 NOTE — Assessment & Plan Note (Addendum)
Longstanding.  However pt states no recent evaluation. Reviewed MRI dated 2003 -  IMPRESSION: DDD AT L4-5 BUT WITHOUT HERNIATION OR SIGNIFICANT NARROWING OF THE CANAL OR NEURAL FORAMINA.  CONSIDERABLE FACET DEGENERATIVE ARTHROPATHY AT L3-4 AND, TO A LESSER DEGREE, AT L5-S1. PARTICULARLY AT L3-4, THIS COULD BE SYMPTOMATIC AS EVIDENCED BY FLUID IN THE JOINTS AND MARROW EDEMA WITHIN THE FACETS AND PEDICLES OF L4. Check lumbar and thoracic films today.  Continue hydrocodone and gabapentin. May benefit from spine MRI to eval for appropriateness of steroid injections or other intervention.

## 2012-03-29 NOTE — Assessment & Plan Note (Signed)
Chronic - see below. Pt will decrease ambien to 5mg  nightly prn anxiety - and hopeful for continued titration off this med.

## 2012-03-30 ENCOUNTER — Other Ambulatory Visit: Payer: Self-pay | Admitting: Family Medicine

## 2012-03-30 ENCOUNTER — Telehealth: Payer: Self-pay | Admitting: Cardiology

## 2012-03-30 NOTE — Telephone Encounter (Signed)
plz phone in. 

## 2012-03-31 NOTE — Telephone Encounter (Signed)
Rx called in as directed.   

## 2012-04-03 ENCOUNTER — Other Ambulatory Visit: Payer: Self-pay | Admitting: Family Medicine

## 2012-04-03 NOTE — Telephone Encounter (Signed)
plz phone in. 

## 2012-04-03 NOTE — Telephone Encounter (Signed)
Ok to refill 

## 2012-04-04 ENCOUNTER — Other Ambulatory Visit: Payer: Self-pay | Admitting: Family Medicine

## 2012-04-04 NOTE — Telephone Encounter (Signed)
Rx called in as directed.   

## 2012-04-06 ENCOUNTER — Ambulatory Visit (INDEPENDENT_AMBULATORY_CARE_PROVIDER_SITE_OTHER): Payer: Medicare Other

## 2012-04-06 ENCOUNTER — Ambulatory Visit (INDEPENDENT_AMBULATORY_CARE_PROVIDER_SITE_OTHER): Payer: Medicare Other | Admitting: Cardiology

## 2012-04-06 ENCOUNTER — Encounter: Payer: Self-pay | Admitting: Cardiology

## 2012-04-06 VITALS — BP 142/90 | HR 57 | Ht 62.5 in | Wt 131.8 lb

## 2012-04-06 DIAGNOSIS — R42 Dizziness and giddiness: Secondary | ICD-10-CM

## 2012-04-06 DIAGNOSIS — I10 Essential (primary) hypertension: Secondary | ICD-10-CM

## 2012-04-06 DIAGNOSIS — R55 Syncope and collapse: Secondary | ICD-10-CM

## 2012-04-06 MED ORDER — DOXAZOSIN MESYLATE 2 MG PO TABS: 2.0000 mg | ORAL_TABLET | Freq: Every day | ORAL | Status: DC

## 2012-04-06 NOTE — Progress Notes (Signed)
[name] Date of Birth: 07-09-1932 Medical Record [id number]  History of Present Illness: Nicole English is seen today for a follow up visit.She has a history of HTN with diastolic dysfunction, HLD, anxiety, depression, diverticulosis and CKD. She has been intolerant to ACE/ARB due to her renal function and HTN. She has been intolerant of amlodipine because of edema.HCTZ has also affected her renal indices. She was also intolerant to higher dose of hydralazine with complaints of increased swelling and headache. On followup today she is not doing well. She complains of a lot of dizziness described as lightheadedness. She feels like she is pulling to one side. She has occasional spinning sensation. She has experienced several episodes where she "fades out". On one occasion she was eating and slumped in her chair. On another occasion she found herself on the floor next to her computer desk. She wasn't sure how she got there. She did get to the emergency room and blood work and CT of the head showed no acute changes.   Current Outpatient Prescriptions on File Prior to Visit  Medication Sig Dispense Refill  . ALPRAZolam (XANAX) 0.25 MG tablet Take 0.25 mg by mouth 2 (two) times daily as needed for sleep or anxiety.       Marland Kitchen aspirin EC 81 MG tablet Take 81 mg by mouth daily.      Marland Kitchen b complex vitamins tablet Take 2 tablets by mouth daily.      . cholecalciferol (VITAMIN D) 1000 UNITS tablet Take 1,000 Units by mouth daily.        . Coenzyme Q10 (COQ10) 50 MG CAPS Take 1 capsule by mouth daily.        . furosemide (LASIX) 20 MG tablet Take 10 mg by mouth daily as needed (swelling).      . gabapentin (NEURONTIN) 100 MG capsule Take 100 mg by mouth 3 (three) times daily.      . hydrALAZINE (APRESOLINE) 25 MG tablet Take 0.5 tablets (12.5 mg total) by mouth 3 (three) times daily.  45 tablet  6  . HYDROcodone-acetaminophen (NORCO/VICODIN) 5-325 MG per tablet TAKE ONE TABLET BY MOUTH TWICE DAILY AS NEEDED  60  tablet  0  . nebivolol (BYSTOLIC) 5 MG tablet Take 10 mg by mouth daily.      . NON FORMULARY Take 1 tablet by mouth 4 (four) times daily. Sea calcium      . omeprazole (PRILOSEC) 20 MG capsule Take 20 mg by mouth daily as needed. For reflux      . zolpidem (AMBIEN) 5 MG tablet Take 1 tablet (5 mg total) by mouth at bedtime as needed for sleep.  30 tablet  0   No current facility-administered medications on file prior to visit.    Allergies  Allergen Reactions  . Doxycycline Monohydrate Nausea And Vomiting  . Ramipril     REACTION: worsening renal function    Past Medical History  Diagnosis Date  . History of diverticulitis of colon   . HLD (hyperlipidemia)   . Anxiety   . Osteoporosis   . Depression   . GERD (gastroesophageal reflux disease)   . IBS (irritable bowel syndrome)   . Glucose intolerance (impaired glucose tolerance)   . Allergic rhinitis   . Migraine   . HTN (hypertension)     Dr. Martinique, cards  . SUI (stress urinary incontinence, female)     Dr. Amalia Hailey, urology (some urge as well)  . Diverticulosis     colonsocopy 2002 aborted  2/2 tortuous colon and heavy sigmoid diverticulosis  . Dysphagia     EGD 2012 - wnl, s/p esoph dilation  . CKD (chronic kidney disease) stage 3, GFR 30-59 ml/min   . Diastolic dysfunction     per echo in November 2012; normal EF  . DDD (degenerative disc disease), lumbar     Past Surgical History  Procedure Laterality Date  . Tonsillectomy  1942  . Submucous resection  1962  . Tubal ligation  1973  . Breast enhancement surgery  1982  . Total abdominal hysterectomy  1996    for frequent dysmennorhea  . Macroplastique implantation  03/2008    Dr. Rogers Blocker  . Uretherolysis and removal of macroplastique  09/13/08    Dr. Amalia Hailey  . Ct head limited w/o cm  04/2010    nothing acute, ? old infarcts L internal capsule  . Renal doppler  2009?    simple cysts, wnl  . Colonoscopy  2002    does not want another!  . Esophagogastroduodenoscopy   2012    WNL, s/p esoph dilation (Dr. Candace Cruise, [facility])  . US echocardiography  11/2010    EF 55-60%, mild LAE, mild diastolic dysfunction, normal valves    History  Smoking status  . Former Smoker -- 0.50 packs/day for 54 years  . Types: Cigarettes  . Start date: 01/18/1954  . Quit date: 01/19/2008  Smokeless tobacco  . Never Used    History  Alcohol Use  . Yes    Comment: 1 1/2-2 glasses white wine/day    Family History  Problem Relation Age of Onset  . Heart failure Father   . Heart disease Father   . Cancer Father     prostate  . Hypertension Father   . Coronary artery disease Father   . Leukemia Mother     CLL prior    Review of Systems: The review of systems is per the HPI.  All other systems were reviewed and are negative.  Physical Exam: BP 142/90  Pulse 57  Ht 5' 2.5" (1.588 m)  Wt 131 lb 12.8 oz (59.784 kg)  BMI 23.71 kg/m2  SpO2 97% Patient is very pleasant and in no acute distress. Skin is warm and dry. Color is normal.  HEENT is unremarkable. Normocephalic/atraumatic. PERRL. Sclera are nonicteric. Neck is supple. No masses. No JVD. Lungs are clear. Cardiac exam shows a regular rate and rhythm. Abdomen is soft. Extremities are without edema. Gait and ROM are intact. No gross neurologic deficits noted.   LABORATORY DATA:   Assessment / Plan: 1. Hypertension. Patient has multiple drug intolerances as listed. I repeated her blood pressure today and it was 170/80 without orthostasis.  2. Near syncope and dizziness. I suspect this is primarily related to her blood pressure medication. I recommended reducing her doxazosin to 2 mg daily. We do need to evaluate further and we'll schedule her for carotid Dopplers, echocardiogram, and event monitor. I'll followup again in 4 weeks.  3. Chronic kidney disease stage II.  4. Chronic anemia.

## 2012-04-06 NOTE — Patient Instructions (Signed)
We will reduce doxazosin to 2 mg daily  We will schedule you for carotid dopplers, and Echocardiogram, and have you wear a monitor.  I will see you in one month.

## 2012-04-07 NOTE — Progress Notes (Signed)
CHERYL PLACED A MONITOR ON PATIENT

## 2012-04-08 DIAGNOSIS — R55 Syncope and collapse: Secondary | ICD-10-CM | POA: Diagnosis not present

## 2012-04-12 ENCOUNTER — Ambulatory Visit (HOSPITAL_COMMUNITY): Payer: Medicare Other | Attending: Cardiology | Admitting: Radiology

## 2012-04-12 DIAGNOSIS — R42 Dizziness and giddiness: Secondary | ICD-10-CM | POA: Insufficient documentation

## 2012-04-12 DIAGNOSIS — R55 Syncope and collapse: Secondary | ICD-10-CM | POA: Diagnosis not present

## 2012-04-12 DIAGNOSIS — I10 Essential (primary) hypertension: Secondary | ICD-10-CM

## 2012-04-12 NOTE — Progress Notes (Signed)
Echocardiogram performed.  

## 2012-04-14 ENCOUNTER — Telehealth: Payer: Self-pay | Admitting: Cardiology

## 2012-04-14 ENCOUNTER — Encounter: Payer: Self-pay | Admitting: Family Medicine

## 2012-04-14 ENCOUNTER — Telehealth: Payer: Self-pay | Admitting: Family Medicine

## 2012-04-14 ENCOUNTER — Ambulatory Visit (INDEPENDENT_AMBULATORY_CARE_PROVIDER_SITE_OTHER): Payer: Medicare Other | Admitting: Family Medicine

## 2012-04-14 VITALS — BP 122/76 | HR 54 | Temp 97.8°F | Wt 131.2 lb

## 2012-04-14 DIAGNOSIS — R197 Diarrhea, unspecified: Secondary | ICD-10-CM

## 2012-04-14 NOTE — Progress Notes (Signed)
  Subjective:    Patient ID: Nicole English, female    DOB: 1932-11-19, 79 y.o.   MRN: [id number]  HPI CC: diarrhea  2 wk history of watery diarrhea with mucous.  One time saw red in stool.  Goes frequently during the day - 5-7x/day.  Stool urgency present.  Cramping abd discomfort prior to diarrhea, then resolved after BM.  Mild nausea.  Appetite down some.    No documented fevers/chills but has felt feverish.  No vomiting.  No dizziness/lightheadedness, normal voiding.  No heartburn sxs.   Eating solid foods well.  No recent change in diet.  No recent travel. No recent antibiotic use.  A few weeks ago grandchild did have diarrhea. Some new restaurants she's tried. Well water but doesn't use this to cook, drinks out of bottled waters.  Recently seen by Dr. Martinique.  Working up for syncope.  States normal echo.  Carotid dopplers pending.  Actually, sxs resolved after decreased cardura from 4mg  to 2mg .  Past Medical History  Diagnosis Date  . History of diverticulitis of colon   . HLD (hyperlipidemia)   . Anxiety   . Osteoporosis   . Depression   . GERD (gastroesophageal reflux disease)   . IBS (irritable bowel syndrome)   . Glucose intolerance (impaired glucose tolerance)   . Allergic rhinitis   . Migraine   . HTN (hypertension)     Dr. Martinique, cards  . SUI (stress urinary incontinence, female)     Dr. Amalia Hailey, urology (some urge as well)  . Diverticulosis     colonsocopy 2002 aborted 2/2 tortuous colon and heavy sigmoid diverticulosis  . Dysphagia     EGD 2012 - wnl, s/p esoph dilation  . CKD (chronic kidney disease) stage 3, GFR 30-59 ml/min   . Diastolic dysfunction     per echo in November 2012; normal EF  . DDD (degenerative disc disease), lumbar      Review of Systems Per HPI    Objective:   Physical Exam  Nursing note and vitals reviewed. Constitutional: She appears well-developed and well-nourished. No distress.  HENT:  Mouth/Throat: Oropharynx is clear and  moist. No oropharyngeal exudate.  Cardiovascular: Normal rate, regular rhythm, normal heart sounds and intact distal pulses.   No murmur heard. Pulmonary/Chest: Effort normal and breath sounds normal. No respiratory distress. She has no wheezes. She has no rales.  Abdominal: Soft. Normal appearance and bowel sounds are normal. She exhibits no distension and no mass. There is no hepatosplenomegaly. There is no tenderness. There is no rebound, no guarding, no CVA tenderness and negative Murphy's sign.  Mild discomfort to deep palpation suprapubically  Musculoskeletal: She exhibits no edema.  Skin: Skin is warm and dry. No rash noted.  Psychiatric: She has a normal mood and affect.       Assessment & Plan:

## 2012-04-14 NOTE — Telephone Encounter (Signed)
Patient called echo results given. 

## 2012-04-14 NOTE — Patient Instructions (Addendum)
Pass by lab to pick up stool cultures. These usually take about 4-5 days to come back. In meantime, bland diet, lots of fluids to stay hydrated. If any fever or worsening pain or bleeding, please seek urgent care.

## 2012-04-14 NOTE — Telephone Encounter (Signed)
Patient Information:  [name] Name: [name]  Phone: [phone]  Patient: Nicole, English  Gender: Female  DOB: 07-24-1932  Age: 79 Years  PCP: Ria Bush [facility])  Office Follow Up:  Does the office need to follow up with this patient?: No  Instructions For The Office: N/A  RN Note:  pt reports some abdominal pain; intermittent and is 3/10  Symptoms  Reason For Call & Symptoms: pt reports she has had diarrhea for 2 weeks.  Pt reports that in the middle of the day she goes and goes.  Pt reports that she is seeing mucous in her stool and a little red blood.  Reviewed Health History In EMR: Yes  Reviewed Medications In EMR: Yes  Reviewed Allergies In EMR: Yes  Reviewed Surgeries / Procedures: Yes  Date of Onset of Symptoms: 03/31/2012  Guideline(s) Used:  Diarrhea  Disposition Per Guideline:   Go to Office Now  Reason For Disposition Reached:   Age > 60 years and has had > 6 diarrhea stools in past 24 hours  Advice Given:  Fluids:  Drink more fluids, at least 8-10 glasses (8 oz or 240 ml) daily.  For example: sports drinks, diluted fruit juices, soft drinks.  Avoid caffeinated beverages (Reason: caffeine is mildly dehydrating).  Call Back If:  You become worse.  Patient Will Follow Care Advice:  YES  Appointment Scheduled:  04/14/2012 16:00:00 Appointment Scheduled Provider:  Ria Bush [facility])

## 2012-04-14 NOTE — Assessment & Plan Note (Addendum)
Of about 2 weeks' duration associated with abd cramping and mucous. Suspicion for bacterial enteritis - will order stool culture, will update her with results. Well hydrated today. Hold abx for now. Red flags to seek care discussed. Not consistent with diverticulitis or other

## 2012-04-14 NOTE — Telephone Encounter (Signed)
Will see today.  

## 2012-04-14 NOTE — Telephone Encounter (Signed)
New problem   Pt is returning your phone call. Please call back.

## 2012-04-17 ENCOUNTER — Ambulatory Visit (HOSPITAL_COMMUNITY)
Admission: RE | Admit: 2012-04-17 | Discharge: 2012-04-17 | Disposition: A | Discharge status: Home / Self Care | Payer: Medicare Other | Source: Ambulatory Visit | Attending: Family Medicine | Admitting: Family Medicine

## 2012-04-17 ENCOUNTER — Other Ambulatory Visit: Payer: Self-pay | Admitting: Family Medicine

## 2012-04-17 DIAGNOSIS — Z1231 Encounter for screening mammogram for malignant neoplasm of breast: Secondary | ICD-10-CM | POA: Diagnosis not present

## 2012-04-17 DIAGNOSIS — R197 Diarrhea, unspecified: Secondary | ICD-10-CM | POA: Diagnosis not present

## 2012-04-17 NOTE — Telephone Encounter (Signed)
Rx called to pharmacy

## 2012-04-17 NOTE — Telephone Encounter (Signed)
plz phone in. 

## 2012-04-18 ENCOUNTER — Encounter: Payer: Self-pay | Admitting: *Deleted

## 2012-04-18 ENCOUNTER — Encounter (INDEPENDENT_AMBULATORY_CARE_PROVIDER_SITE_OTHER): Payer: Medicare Other

## 2012-04-18 ENCOUNTER — Encounter: Payer: Self-pay | Admitting: Family Medicine

## 2012-04-18 DIAGNOSIS — R55 Syncope and collapse: Secondary | ICD-10-CM

## 2012-04-18 DIAGNOSIS — R42 Dizziness and giddiness: Secondary | ICD-10-CM | POA: Diagnosis not present

## 2012-04-18 DIAGNOSIS — I10 Essential (primary) hypertension: Secondary | ICD-10-CM

## 2012-04-21 LAB — STOOL CULTURE

## 2012-05-04 ENCOUNTER — Other Ambulatory Visit: Payer: Self-pay | Admitting: Family Medicine

## 2012-05-04 NOTE — Telephone Encounter (Signed)
Rx called in as directed.   

## 2012-05-04 NOTE — Telephone Encounter (Signed)
plz phone in. 

## 2012-05-08 ENCOUNTER — Other Ambulatory Visit: Payer: Self-pay | Admitting: Family Medicine

## 2012-05-08 NOTE — Telephone Encounter (Signed)
Ok to refill 

## 2012-05-08 NOTE — Telephone Encounter (Signed)
Rx called in as directed.   

## 2012-05-08 NOTE — Telephone Encounter (Signed)
plz phone in. 

## 2012-05-12 ENCOUNTER — Encounter: Payer: Self-pay | Admitting: Cardiology

## 2012-05-12 ENCOUNTER — Ambulatory Visit (INDEPENDENT_AMBULATORY_CARE_PROVIDER_SITE_OTHER): Payer: Medicare Other | Admitting: Cardiology

## 2012-05-12 VITALS — BP 132/80 | HR 52 | Ht 62.5 in | Wt 131.1 lb

## 2012-05-12 DIAGNOSIS — I519 Heart disease, unspecified: Secondary | ICD-10-CM

## 2012-05-12 DIAGNOSIS — R55 Syncope and collapse: Secondary | ICD-10-CM

## 2012-05-12 DIAGNOSIS — I5189 Other ill-defined heart diseases: Secondary | ICD-10-CM

## 2012-05-12 DIAGNOSIS — I10 Essential (primary) hypertension: Secondary | ICD-10-CM

## 2012-05-12 NOTE — Progress Notes (Signed)
[name] Date of Birth: 17-Jun-1932 Medical Record [id number]  History of Present Illness: Nicole English is seen today for a follow up visit.She has a history of HTN with diastolic dysfunction, HLD, anxiety, depression, diverticulosis and CKD. She has been intolerant to ACE/ARB due to her renal function and HTN. She has been intolerant of amlodipine because of edema.HCTZ has also affected her renal indices. She was also intolerant to higher dose of hydralazine with complaints of increased swelling and headache. On her visit she had complained of increased lightheadedness, vertigo, and near syncope. With reduction in her doxazosin dose the symptoms resolved. She had carotid Dopplers and an echocardiogram done which were unremarkable. An event monitor showed no evidence of arrhythmia. She is feeling much better today.  Current Outpatient Prescriptions on File Prior to Visit  Medication Sig Dispense Refill  . ALPRAZolam (XANAX) 0.25 MG tablet TAKE ONE TABLET BY MOUTH TWICE DAILY AS NEEDED FOR ANXIETY OR SLEEP  60 tablet  0  . aspirin EC 81 MG tablet Take 81 mg by mouth daily.      Marland Kitchen b complex vitamins tablet Take 2 tablets by mouth daily.      . cholecalciferol (VITAMIN D) 1000 UNITS tablet Take 1,000 Units by mouth daily.        . Coenzyme Q10 (COQ10) 50 MG CAPS Take 1 capsule by mouth daily.        Marland Kitchen doxazosin (CARDURA) 2 MG tablet Take 1 tablet (2 mg total) by mouth at bedtime.  30 tablet  6  . furosemide (LASIX) 20 MG tablet Take 10 mg by mouth daily as needed (swelling).      . gabapentin (NEURONTIN) 100 MG capsule Take 100 mg by mouth 3 (three) times daily.      . hydrALAZINE (APRESOLINE) 25 MG tablet Take 0.5 tablets (12.5 mg total) by mouth 3 (three) times daily.  45 tablet  6  . HYDROcodone-acetaminophen (NORCO/VICODIN) 5-325 MG per tablet TAKE ONE TABLET BY MOUTH TWICE DAILY AS NEEDED  60 tablet  0  . nebivolol (BYSTOLIC) 5 MG tablet Take 10 mg by mouth daily.      . NON FORMULARY Take 1  tablet by mouth 4 (four) times daily. Sea calcium      . omeprazole (PRILOSEC) 20 MG capsule Take 20 mg by mouth daily as needed. For reflux      . zolpidem (AMBIEN) 5 MG tablet TAKE ONE TABLET BY MOUTH AT BEDTIME AS NEEDED FOR SLEEP  30 tablet  0   No current facility-administered medications on file prior to visit.    Allergies  Allergen Reactions  . Doxycycline Monohydrate Nausea And Vomiting  . Ramipril     REACTION: worsening renal function    Past Medical History  Diagnosis Date  . History of diverticulitis of colon   . HLD (hyperlipidemia)   . Anxiety   . Osteoporosis   . Depression   . GERD (gastroesophageal reflux disease)   . IBS (irritable bowel syndrome)   . Glucose intolerance (impaired glucose tolerance)   . Allergic rhinitis   . Migraine   . HTN (hypertension)     Dr. Martinique, cards  . SUI (stress urinary incontinence, female)     Dr. Amalia Hailey, urology (some urge as well)  . Diverticulosis     colonsocopy 2002 aborted 2/2 tortuous colon and heavy sigmoid diverticulosis  . Dysphagia     EGD 2012 - wnl, s/p esoph dilation  . CKD (chronic kidney disease) stage 3,  GFR 30-59 ml/min   . Diastolic dysfunction     per echo in November 2012; normal EF  . DDD (degenerative disc disease), lumbar     Past Surgical History  Procedure Laterality Date  . Tonsillectomy  1942  . Submucous resection  1962  . Tubal ligation  1973  . Breast enhancement surgery  1982  . Total abdominal hysterectomy  1996    for frequent dysmennorhea  . Macroplastique implantation  03/2008    Dr. Rogers Blocker  . Uretherolysis and removal of macroplastique  09/13/08    Dr. Amalia Hailey  . Ct head limited w/o cm  04/2010    nothing acute, ? old infarcts L internal capsule  . Renal doppler  2009?    simple cysts, wnl  . Colonoscopy  2002    does not want another!  . Esophagogastroduodenoscopy  2012    WNL, s/p esoph dilation (Dr. Candace Cruise, [facility])  . US echocardiography  11/2010    EF 55-60%, mild LAE, mild  diastolic dysfunction, normal valves    History  Smoking status  . Former Smoker -- 0.50 packs/day for 54 years  . Types: Cigarettes  . Start date: 01/18/1954  . Quit date: 01/19/2008  Smokeless tobacco  . Never Used    History  Alcohol Use  . Yes    Comment: 1 1/2-2 glasses white wine/day    Family History  Problem Relation Age of Onset  . Heart failure Father   . Heart disease Father   . Cancer Father     prostate  . Hypertension Father   . Coronary artery disease Father   . Leukemia Mother     CLL prior    Review of Systems: The review of systems is per the HPI.  All other systems were reviewed and are negative.  Physical Exam: BP 132/80  Pulse 52  Ht 5' 2.5" (1.588 m)  Wt 131 lb 1.9 oz (59.476 kg)  BMI 23.59 kg/m2 Patient is very pleasant and in no acute distress. Skin is warm and dry. Color is normal.  HEENT is unremarkable. Normocephalic/atraumatic. PERRL. Sclera are nonicteric. Neck is supple. No masses. No JVD. Lungs are clear. Cardiac exam shows a regular rate and rhythm. Abdomen is soft. Extremities are without edema. Gait and ROM are intact. No gross neurologic deficits noted.   LABORATORY DATA:   Assessment / Plan: 1. Hypertension. Patient has multiple drug intolerances as listed. blood pressure is well controlled today.  2. Near syncope and dizziness. This appears to have been medication related. Symptoms have resolved with reduction in her doxazosin dose. Other evaluation with echocardiogram, carotid Dopplers, and event monitor was unremarkable.  3. Chronic kidney disease stage II.  4. Chronic anemia.

## 2012-05-12 NOTE — Patient Instructions (Signed)
Continue your current therapy  I will see you in 6 months.   

## 2012-05-16 ENCOUNTER — Other Ambulatory Visit: Payer: Self-pay | Admitting: Family Medicine

## 2012-05-16 DIAGNOSIS — Z85828 Personal history of other malignant neoplasm of skin: Secondary | ICD-10-CM | POA: Diagnosis not present

## 2012-05-16 DIAGNOSIS — L821 Other seborrheic keratosis: Secondary | ICD-10-CM | POA: Diagnosis not present

## 2012-05-17 NOTE — Telephone Encounter (Signed)
Please call in

## 2012-05-17 NOTE — Telephone Encounter (Signed)
Ok to refill in Dr. Synthia Innocent absence? Last filled 04/17/12.

## 2012-05-17 NOTE — Telephone Encounter (Signed)
Rx called in as directed.   

## 2012-05-18 ENCOUNTER — Telehealth: Payer: Self-pay

## 2012-05-18 NOTE — Telephone Encounter (Signed)
Patient called event monitor normal.Advised to keep appointment with Dr.Jordan in 10/14.

## 2012-05-24 ENCOUNTER — Telehealth: Payer: Self-pay | Admitting: *Deleted

## 2012-05-24 NOTE — Telephone Encounter (Signed)
Patient returned my call and left message that she did NOT request these items and did not need them. She asks that I disregard the request and thinks it must be someone trying to "make money" off of her. Faxed forms advising company patient did not want items and to not request them again.

## 2012-05-24 NOTE — Telephone Encounter (Signed)
Form received for knee and back brace. Left message for patient to call me back and confirm if she requested these items. Will await return call.

## 2012-05-28 ENCOUNTER — Other Ambulatory Visit: Payer: Self-pay | Admitting: Cardiology

## 2012-06-01 ENCOUNTER — Other Ambulatory Visit: Payer: Self-pay | Admitting: Family Medicine

## 2012-06-02 ENCOUNTER — Other Ambulatory Visit: Payer: Self-pay | Admitting: Family Medicine

## 2012-06-02 NOTE — Telephone Encounter (Signed)
Called to [facility] 

## 2012-06-02 NOTE — Telephone Encounter (Signed)
plz phone in. 

## 2012-06-05 ENCOUNTER — Telehealth: Payer: Self-pay | Admitting: Cardiology

## 2012-06-05 NOTE — Telephone Encounter (Signed)
Returned call to patient she stated she cannot take hydralazine.Stated she has gained 20 lbs in her middle area within 1 year.Stated she is having constipation or diarrhea,pain in lower abdomen.Spoke to Connell he advised to decrease hydralazine to 12.5 mg twice a day.Advised to see GI Dr.Patient stated she is having pain in pelvic bones and achy all over.Advised to try hydralazine 12.5 mg twice a day and if continues to have symptoms to call back.

## 2012-06-05 NOTE — Telephone Encounter (Signed)
New Prob     Pt requesting an alternative for HYDRALAZINE. States it is giving her some issues. Please call.

## 2012-06-06 ENCOUNTER — Other Ambulatory Visit: Payer: Self-pay | Admitting: Family Medicine

## 2012-06-07 NOTE — Telephone Encounter (Signed)
plz phone in. 

## 2012-06-07 NOTE — Telephone Encounter (Signed)
Rx called in as directed.   

## 2012-06-08 DIAGNOSIS — M999 Biomechanical lesion, unspecified: Secondary | ICD-10-CM | POA: Diagnosis not present

## 2012-06-08 DIAGNOSIS — M5380 Other specified dorsopathies, site unspecified: Secondary | ICD-10-CM | POA: Diagnosis not present

## 2012-06-08 DIAGNOSIS — M546 Pain in thoracic spine: Secondary | ICD-10-CM | POA: Diagnosis not present

## 2012-06-08 DIAGNOSIS — M5412 Radiculopathy, cervical region: Secondary | ICD-10-CM | POA: Diagnosis not present

## 2012-06-08 DIAGNOSIS — M533 Sacrococcygeal disorders, not elsewhere classified: Secondary | ICD-10-CM | POA: Diagnosis not present

## 2012-06-08 DIAGNOSIS — M9981 Other biomechanical lesions of cervical region: Secondary | ICD-10-CM | POA: Diagnosis not present

## 2012-06-13 DIAGNOSIS — M5380 Other specified dorsopathies, site unspecified: Secondary | ICD-10-CM | POA: Diagnosis not present

## 2012-06-13 DIAGNOSIS — M5412 Radiculopathy, cervical region: Secondary | ICD-10-CM | POA: Diagnosis not present

## 2012-06-13 DIAGNOSIS — M546 Pain in thoracic spine: Secondary | ICD-10-CM | POA: Diagnosis not present

## 2012-06-13 DIAGNOSIS — M999 Biomechanical lesion, unspecified: Secondary | ICD-10-CM | POA: Diagnosis not present

## 2012-06-13 DIAGNOSIS — M533 Sacrococcygeal disorders, not elsewhere classified: Secondary | ICD-10-CM | POA: Diagnosis not present

## 2012-06-13 DIAGNOSIS — M9981 Other biomechanical lesions of cervical region: Secondary | ICD-10-CM | POA: Diagnosis not present

## 2012-06-15 ENCOUNTER — Other Ambulatory Visit: Payer: Self-pay

## 2012-06-15 MED ORDER — ALPRAZOLAM 0.25 MG PO TABS: ORAL_TABLET | ORAL | Status: DC

## 2012-06-15 NOTE — Telephone Encounter (Signed)
Pt request refill alprazolam to [facility] [place]. Please advise.

## 2012-06-15 NOTE — Telephone Encounter (Signed)
plz phone in. 

## 2012-06-16 NOTE — Telephone Encounter (Signed)
Rx called in as directed.   

## 2012-06-29 ENCOUNTER — Other Ambulatory Visit: Payer: Self-pay | Admitting: *Deleted

## 2012-06-29 MED ORDER — FUROSEMIDE 20 MG PO TABS: 10.0000 mg | ORAL_TABLET | ORAL | Status: DC | PRN

## 2012-07-05 ENCOUNTER — Other Ambulatory Visit: Payer: Self-pay | Admitting: Family Medicine

## 2012-07-06 NOTE — Telephone Encounter (Signed)
plz phone in. 

## 2012-07-06 NOTE — Telephone Encounter (Signed)
Rx called in as directed.   

## 2012-07-10 ENCOUNTER — Other Ambulatory Visit: Payer: Self-pay | Admitting: Family Medicine

## 2012-07-10 NOTE — Telephone Encounter (Signed)
plz phone in. 

## 2012-07-11 ENCOUNTER — Other Ambulatory Visit: Payer: Self-pay | Admitting: Family Medicine

## 2012-07-11 NOTE — Telephone Encounter (Signed)
Phoned in to [place].

## 2012-07-12 NOTE — Telephone Encounter (Signed)
plz phone in. 

## 2012-07-12 NOTE — Telephone Encounter (Signed)
Rx called in as directed.   

## 2012-07-16 ENCOUNTER — Other Ambulatory Visit: Payer: Self-pay | Admitting: Family Medicine

## 2012-07-16 NOTE — Telephone Encounter (Signed)
plz phone in. 

## 2012-07-17 ENCOUNTER — Other Ambulatory Visit: Payer: Self-pay | Admitting: Family Medicine

## 2012-07-17 DIAGNOSIS — M533 Sacrococcygeal disorders, not elsewhere classified: Secondary | ICD-10-CM | POA: Diagnosis not present

## 2012-07-17 DIAGNOSIS — IMO0002 Reserved for concepts with insufficient information to code with codable children: Secondary | ICD-10-CM | POA: Diagnosis not present

## 2012-07-17 DIAGNOSIS — M545 Low back pain: Secondary | ICD-10-CM | POA: Diagnosis not present

## 2012-07-17 DIAGNOSIS — M542 Cervicalgia: Secondary | ICD-10-CM | POA: Diagnosis not present

## 2012-07-17 DIAGNOSIS — M999 Biomechanical lesion, unspecified: Secondary | ICD-10-CM | POA: Diagnosis not present

## 2012-07-17 DIAGNOSIS — M9981 Other biomechanical lesions of cervical region: Secondary | ICD-10-CM | POA: Diagnosis not present

## 2012-07-17 NOTE — Telephone Encounter (Signed)
Rx called in as directed.   

## 2012-08-17 ENCOUNTER — Other Ambulatory Visit: Payer: Self-pay | Admitting: Family Medicine

## 2012-08-17 NOTE — Telephone Encounter (Signed)
Rx called in as directed.   

## 2012-08-17 NOTE — Telephone Encounter (Signed)
plz phone in. 

## 2012-08-17 NOTE — Telephone Encounter (Signed)
Pt request status of refill; advised called to [facility] and pt will ck with pharmacy.

## 2012-08-18 ENCOUNTER — Encounter (HOSPITAL_COMMUNITY): Payer: Self-pay | Admitting: Adult Health

## 2012-08-18 ENCOUNTER — Other Ambulatory Visit: Payer: Self-pay | Admitting: Family Medicine

## 2012-08-18 ENCOUNTER — Telehealth: Payer: Self-pay | Admitting: Family Medicine

## 2012-08-18 ENCOUNTER — Emergency Department (HOSPITAL_COMMUNITY): Payer: Medicare Other

## 2012-08-18 ENCOUNTER — Observation Stay (HOSPITAL_COMMUNITY)
Admission: EM | Admit: 2012-08-18 | Discharge: 2012-08-19 | Disposition: A | Discharge status: Home / Self Care | Payer: Medicare Other | Attending: Internal Medicine | Admitting: Internal Medicine

## 2012-08-18 DIAGNOSIS — Z8719 Personal history of other diseases of the digestive system: Secondary | ICD-10-CM

## 2012-08-18 DIAGNOSIS — R0989 Other specified symptoms and signs involving the circulatory and respiratory systems: Secondary | ICD-10-CM | POA: Insufficient documentation

## 2012-08-18 DIAGNOSIS — F3289 Other specified depressive episodes: Secondary | ICD-10-CM | POA: Insufficient documentation

## 2012-08-18 DIAGNOSIS — F411 Generalized anxiety disorder: Secondary | ICD-10-CM | POA: Diagnosis not present

## 2012-08-18 DIAGNOSIS — R0609 Other forms of dyspnea: Secondary | ICD-10-CM | POA: Diagnosis not present

## 2012-08-18 DIAGNOSIS — F419 Anxiety disorder, unspecified: Secondary | ICD-10-CM | POA: Diagnosis present

## 2012-08-18 DIAGNOSIS — E559 Vitamin D deficiency, unspecified: Secondary | ICD-10-CM

## 2012-08-18 DIAGNOSIS — R42 Dizziness and giddiness: Secondary | ICD-10-CM

## 2012-08-18 DIAGNOSIS — E785 Hyperlipidemia, unspecified: Secondary | ICD-10-CM

## 2012-08-18 DIAGNOSIS — J449 Chronic obstructive pulmonary disease, unspecified: Secondary | ICD-10-CM

## 2012-08-18 DIAGNOSIS — I10 Essential (primary) hypertension: Secondary | ICD-10-CM

## 2012-08-18 DIAGNOSIS — N393 Stress incontinence (female) (male): Secondary | ICD-10-CM

## 2012-08-18 DIAGNOSIS — M549 Dorsalgia, unspecified: Secondary | ICD-10-CM

## 2012-08-18 DIAGNOSIS — R0602 Shortness of breath: Secondary | ICD-10-CM | POA: Diagnosis not present

## 2012-08-18 DIAGNOSIS — I498 Other specified cardiac arrhythmias: Secondary | ICD-10-CM | POA: Insufficient documentation

## 2012-08-18 DIAGNOSIS — Z79899 Other long term (current) drug therapy: Secondary | ICD-10-CM | POA: Insufficient documentation

## 2012-08-18 DIAGNOSIS — F32A Depression, unspecified: Secondary | ICD-10-CM

## 2012-08-18 DIAGNOSIS — N184 Chronic kidney disease, stage 4 (severe): Secondary | ICD-10-CM | POA: Diagnosis present

## 2012-08-18 DIAGNOSIS — I129 Hypertensive chronic kidney disease with stage 1 through stage 4 chronic kidney disease, or unspecified chronic kidney disease: Secondary | ICD-10-CM | POA: Diagnosis not present

## 2012-08-18 DIAGNOSIS — K589 Irritable bowel syndrome without diarrhea: Secondary | ICD-10-CM

## 2012-08-18 DIAGNOSIS — Z0000 Encounter for general adult medical examination without abnormal findings: Secondary | ICD-10-CM

## 2012-08-18 DIAGNOSIS — R55 Syncope and collapse: Secondary | ICD-10-CM

## 2012-08-18 DIAGNOSIS — R0600 Dyspnea, unspecified: Secondary | ICD-10-CM

## 2012-08-18 DIAGNOSIS — I5189 Other ill-defined heart diseases: Secondary | ICD-10-CM

## 2012-08-18 DIAGNOSIS — K219 Gastro-esophageal reflux disease without esophagitis: Secondary | ICD-10-CM

## 2012-08-18 DIAGNOSIS — J4489 Other specified chronic obstructive pulmonary disease: Secondary | ICD-10-CM | POA: Insufficient documentation

## 2012-08-18 DIAGNOSIS — N183 Chronic kidney disease, stage 3 unspecified: Secondary | ICD-10-CM | POA: Diagnosis not present

## 2012-08-18 DIAGNOSIS — R079 Chest pain, unspecified: Secondary | ICD-10-CM | POA: Diagnosis not present

## 2012-08-18 DIAGNOSIS — D649 Anemia, unspecified: Secondary | ICD-10-CM

## 2012-08-18 DIAGNOSIS — G4700 Insomnia, unspecified: Secondary | ICD-10-CM

## 2012-08-18 DIAGNOSIS — R001 Bradycardia, unspecified: Secondary | ICD-10-CM

## 2012-08-18 DIAGNOSIS — F329 Major depressive disorder, single episode, unspecified: Secondary | ICD-10-CM | POA: Diagnosis present

## 2012-08-18 DIAGNOSIS — R4182 Altered mental status, unspecified: Secondary | ICD-10-CM

## 2012-08-18 DIAGNOSIS — M810 Age-related osteoporosis without current pathological fracture: Secondary | ICD-10-CM

## 2012-08-18 DIAGNOSIS — R197 Diarrhea, unspecified: Secondary | ICD-10-CM

## 2012-08-18 LAB — BASIC METABOLIC PANEL
Calcium: 9.7 mg/dL (ref 8.4–10.5)
GFR calc Af Amer: 32 mL/min — ABNORMAL LOW (ref >90)
GFR calc non Af Amer: 28 mL/min — ABNORMAL LOW (ref >90)
Potassium: 4.9 mEq/L (ref 3.5–5.1)
Sodium: 138 mEq/L (ref 135–145)

## 2012-08-18 LAB — PRO B NATRIURETIC PEPTIDE: Pro B Natriuretic peptide (BNP): 2700 pg/mL — ABNORMAL HIGH (ref 0–450)

## 2012-08-18 LAB — POCT I-STAT TROPONIN I: Troponin i, poc: 0.01 ng/mL (ref 0.00–0.08)

## 2012-08-18 LAB — CBC
MCH: 30.5 pg (ref 26.0–34.0)
MCHC: 32.5 g/dL (ref 30.0–36.0)
Platelets: 238 10*3/uL (ref 150–400)

## 2012-08-18 MED ORDER — FUROSEMIDE 10 MG/ML IJ SOLN
40.0000 mg | Freq: Once | INTRAMUSCULAR | Status: AC
  Administered 2012-08-18: 40 mg via INTRAVENOUS
  Filled 2012-08-18: qty 4

## 2012-08-18 MED ORDER — SODIUM CHLORIDE 0.9 % IV SOLN
Freq: Once | INTRAVENOUS | Status: AC
  Administered 2012-08-18: 23:00:00 via INTRAVENOUS

## 2012-08-18 MED ORDER — ASPIRIN 81 MG PO CHEW
162.0000 mg | CHEWABLE_TABLET | Freq: Once | ORAL | Status: AC
  Administered 2012-08-18: 162 mg via ORAL
  Filled 2012-08-18: qty 3

## 2012-08-18 MED ORDER — ASPIRIN 325 MG PO TABS: 325.0000 mg | ORAL_TABLET | ORAL | Status: DC

## 2012-08-18 NOTE — Telephone Encounter (Signed)
Pt called to verify hydrocodone was refilled; advised pt spoke with [facility] Glatter and rx ready for pick up.

## 2012-08-18 NOTE — Telephone Encounter (Signed)
Patient Information:  [name] Name: Donny  Phone: [phone]  Patient: Nicole, English  Gender: Female  DOB: 1932/02/19  Age: 79 Years  PCP: Ria Bush [facility])  Office Follow Up:  Does the office need to follow up with this patient?: No  Instructions For The Office: N/A  RN Note:  Pt states she has Congestion w/ SOB and Back pain that all started 2 days ago w/ no Cough. Pt denies SOB at time of call.  Discussed [facility] hours on 8-2 and [facility] if symptoms worsen.  Symptoms  Reason For Call & Symptoms: Back Pain, SOB, Chest Congestion  Reviewed Health History In EMR: Yes  Reviewed Medications In EMR: Yes  Reviewed Allergies In EMR: Yes  Reviewed Surgeries / Procedures: Yes  Date of Onset of Symptoms: 08/16/2012  Guideline(s) Used:  Breathing Difficulty  Disposition Per Guideline:   See Within 2 Weeks in Office  Reason For Disposition Reached:   Mild longstanding difficulty breathing (e.g., speaks in phrases, SOB even at rest, pulse 100-120) and same as normal  Advice Given:  General Care Advice for Breathing Difficulty:  Use a humidifier.  Call Back If:  You become worse.  Patient Will Follow Care Advice:  YES

## 2012-08-18 NOTE — H&P (Addendum)
Patient's PCP: Ria Bush, MD Patient's Cardiologist: Dr. Martinique  Chief Complaint: Chest pain  History of Present Illness: Nicole English is a 79 y.o. Caucasian female with history of hyperlipidemia, anxiety, depression, GERD, hypertension, bradycardia, chronic kidney disease stage III, COPD, and history of tobacco abuse (former smoker quit 10 years ago) who presents with complaints of chest pain.  Patient reports that she has been having some mid back pain for the last few days.  She noted pain to her shoulder blades yesterday.  Today she was changing her clothes in the afternoon between 3-5 p.m. and she noted chest pressure sensation.  The pain did not radiate down her left arm. She was not nondiaphoretic or any jaw pain.  She presented to the emergency department for further evaluation.  Hospitalist service was asked to admit the patient for further care and management. She denies any recent fevers, chills, nausea, vomiting, abdominal pain, diarrhea, headaches or vision changes.  Patient does report that she feels short of breath over the last few weeks and the ear was feeling "heavy", and she is also noted some wheezing in the morning.  Denies any cough.  Review of Systems: All systems reviewed with the patient and positive as per history of present illness, otherwise all other systems are negative.  Past Medical History  Diagnosis Date  . History of diverticulitis of colon   . HLD (hyperlipidemia)   . Anxiety   . Osteoporosis   . Depression   . GERD (gastroesophageal reflux disease)   . IBS (irritable bowel syndrome)   . Glucose intolerance (impaired glucose tolerance)   . Allergic rhinitis   . Migraine   . HTN (hypertension)     Dr. Martinique, cards  . SUI (stress urinary incontinence, female)     Dr. Amalia Hailey, urology (some urge as well)  . Diverticulosis     colonsocopy 2002 aborted 2/2 tortuous colon and heavy sigmoid diverticulosis  . Dysphagia     EGD 2012 - wnl, s/p esoph  dilation  . CKD (chronic kidney disease) stage 3, GFR 30-59 ml/min   . Diastolic dysfunction     per echo in November 2012; normal EF  . DDD (degenerative disc disease), lumbar    Past Surgical History  Procedure Laterality Date  . Tonsillectomy  1942  . Submucous resection  1962  . Tubal ligation  1973  . Breast enhancement surgery  1982  . Total abdominal hysterectomy  1996    for frequent dysmennorhea  . Macroplastique implantation  03/2008    Dr. Rogers Blocker  . Uretherolysis and removal of macroplastique  09/13/08    Dr. Amalia Hailey  . Ct head limited w/o cm  04/2010    nothing acute, ? old infarcts L internal capsule  . Renal doppler  2009?    simple cysts, wnl  . Colonoscopy  2002    does not want another!  . Esophagogastroduodenoscopy  2012    WNL, s/p esoph dilation (Dr. Candace Cruise, [facility])  . US echocardiography  11/2010    EF 55-60%, mild LAE, mild diastolic dysfunction, normal valves   Family History  Problem Relation Age of Onset  . Heart failure Father   . Heart disease Father   . Cancer Father     prostate  . Hypertension Father   . Coronary artery disease Father   . Leukemia Mother     CLL prior   History   Social History  . Marital Status: Widowed    Spouse Name: N/A  Number of Children: N/A  . Years of Education: N/A   Occupational History  . Former Personal assistant    Social History Main Topics  . Smoking status: Former Smoker -- 0.50 packs/day for 54 years    Types: Cigarettes    Start date: 01/18/1954    Quit date: 01/19/2008  . Smokeless tobacco: Never Used  . Alcohol Use: Yes     Comment: 1 1/2-2 glasses white wine/day  . Drug Use: No  . Sexually Active: Not Currently   Other Topics Concern  . Not on file   Social History Narrative  . No narrative on file   Allergies: Doxycycline monohydrate and Ramipril  Home Meds: Prior to Admission medications   Medication Sig Start Date End Date Taking? Authorizing Provider  ALPRAZolam (XANAX) 0.25 MG  tablet Take 0.25 mg by mouth 2 (two) times daily as needed for anxiety.   Yes Historical Provider, MD  aspirin EC 81 MG tablet Take 81 mg by mouth daily.   Yes Historical Provider, MD  b complex vitamins tablet Take 2 tablets by mouth daily.   Yes Historical Provider, MD  cholecalciferol (VITAMIN D) 1000 UNITS tablet Take 1,000 Units by mouth daily.     Yes Historical Provider, MD  Coenzyme Q10 (COQ10) 50 MG CAPS Take 1 capsule by mouth daily.     Yes Historical Provider, MD  doxazosin (CARDURA) 2 MG tablet Take 1 tablet (2 mg total) by mouth at bedtime. 04/06/12 04/06/13 Yes Peter M Martinique, MD  furosemide (LASIX) 20 MG tablet Take 0.5 tablets (10 mg total) by mouth as needed (swelling). 06/29/12  Yes Peter M Martinique, MD  gabapentin (NEURONTIN) 100 MG capsule Take 100 mg by mouth 3 (three) times daily.   Yes Historical Provider, MD  Glycerin-Hypromellose-PEG 400 (VISINE PURE TEARS OP) Place 2 drops into both eyes daily.   Yes Historical Provider, MD  hydrALAZINE (APRESOLINE) 25 MG tablet Take 0.5 tablets (12.5 mg total) by mouth 3 (three) times daily. 02/21/12 02/20/13 Yes Peter M Martinique, MD  HYDROcodone-acetaminophen (NORCO/VICODIN) 5-325 MG per tablet Take 1 tablet by mouth 2 (two) times daily as needed for pain.   Yes Historical Provider, MD  nebivolol (BYSTOLIC) 5 MG tablet Take 10 mg by mouth daily.   Yes Historical Provider, MD  nebivolol (BYSTOLIC) 5 MG tablet Take 10 mg by mouth daily.   Yes Historical Provider, MD  NON FORMULARY Take 1 tablet by mouth 4 (four) times daily. Sea calcium   Yes Historical Provider, MD  omeprazole (PRILOSEC) 20 MG capsule Take 20 mg by mouth daily as needed. For reflux   Yes Historical Provider, MD  zolpidem (AMBIEN) 5 MG tablet Take 5 mg by mouth at bedtime as needed for sleep.   Yes Historical Provider, MD    Physical Exam: Blood pressure 112/47, pulse 52, temperature 97.8 F (36.6 C), temperature source Oral, resp. rate 18, SpO2 98.00%. General: Awake, Oriented  x3, No acute distress. HEENT: EOMI, Moist mucous membranes Neck: Supple CV: S1 and S2 Lungs: Clear to ascultation bilaterally Abdomen: Soft, Nontender, Nondistended, +bowel sounds. Ext: Good pulses. Trace edema. No clubbing or cyanosis noted. Neuro: Cranial Nerves II-XII grossly intact. Has 5/5 motor strength in upper and lower extremities.  Lab results:  Recent Labs  08/18/12 1947  NA 138  K 4.9  CL 105  CO2 23  GLUCOSE 104*  BUN 27*  CREATININE 1.70*  CALCIUM 9.7   No results found for this basename: AST, ALT, ALKPHOS, BILITOT, PROT,  ALBUMIN,  in the last 72 hours No results found for this basename: LIPASE, AMYLASE,  in the last 72 hours  Recent Labs  08/18/12 1947  WBC 6.0  HGB 11.7*  HCT 36.0  MCV 93.8  PLT 238   No results found for this basename: CKTOTAL, CKMB, CKMBINDEX, TROPONINI,  in the last 72 hours No components found with this basename: POCBNP,  No results found for this basename: DDIMER,  in the last 72 hours No results found for this basename: HGBA1C,  in the last 72 hours No results found for this basename: CHOL, HDL, LDLCALC, TRIG, CHOLHDL, LDLDIRECT,  in the last 72 hours No results found for this basename: TSH, T4TOTAL, FREET3, T3FREE, THYROIDAB,  in the last 72 hours No results found for this basename: VITAMINB12, FOLATE, FERRITIN, TIBC, IRON, RETICCTPCT,  in the last 72 hours Imaging results:  Dg Chest 2 View  08/18/2012   *RADIOLOGY REPORT*  Clinical Data: 2-week history of left-sided chest pain.  Former smoker with current history of hypertension.  CHEST - 2 VIEW  Comparison: Two-view chest x-ray 09/04/2011, 08/17/2010.  Findings: Cardiac silhouette normal in size, unchanged.  Thoracic aorta atherosclerotic, unchanged.  Hilar and mediastinal contours otherwise unremarkable.  Stable hyperinflation.  Lungs clear. Bronchovascular markings normal.  Pulmonary vascularity normal.  No pneumothorax.  No pleural effusions.  Calcified bilateral breast  prostheses.  Mild degenerative changes involving the thoracic spine.  IMPRESSION: Hyperinflation consistent with COPD and/or asthma.  No acute cardiopulmonary disease.  Stable examination.   Original Report Authenticated By: Evangeline Dakin, M.D.   Other results: EKG: Sinus bradycardia, unchanged from previous EKG, heart rate 81.  Assessment & Plan by Problem: Chest pain  Admit the patient to telemetry, rule the patient out for acute coronary syndrome.  Reports that she has had a stress test in [place] 2 years ago, records unavailable for my review.  Patient had a 2-D echocardiogram on March of 2014 that showed cavity size was normal, systolic function was normal, EF 60-65%.  Request cardiology consultation in the morning, discussed with Dr. Ulyses Amor.  Continue aspirin.  Continue nebivolol for now.  Sinus bradycardia Appears to be chronic.  Continue nebivolol for now.  Chronic kidney disease stage III Renal function at baseline.  Continue to monitor.  Elevated BNP Likely due to chronic kidney disease stage III, patient does not appear to be volume overloaded on exam.  Patient did receive one dose of Lasix in the emergency department.  Continue to monitor.  Depression/anxiety Stable.  Hypertension Stable.  Continue home antihypertensive medications.  History of COPD Stable.  Does not appear to be in acute exacerbation at this time.  No wheezing on exam.  Prophylaxis Lovenox.  CODE STATUS Full code.  Disposition Admit the patient to telemetry as observation.  Time spent on admission, talking to the patient, and coordinating care was: 50 mins.  Ishi Danser A, MD 08/18/2012, 11:30 PM

## 2012-08-18 NOTE — ED Provider Notes (Signed)
Medical screening examination/treatment/procedure(s) were conducted as a shared visit with non-physician practitioner(s) and myself.  I personally evaluated the patient during the encounter.  Seen for chest pain and shortness of breath, primarily DOE. Workup concerning for possible CHF. Patient to be administered diuresis, observed for chest pain rule out.  Orpah Greek, MD 08/18/12 [id number]

## 2012-08-18 NOTE — ED Notes (Signed)
Presents with  Sternal chest tightness and back pain associated with SOB with exertion, dizziness. Denies nausea. Chest tightness began today. Nothing makes chest tightness better. Sinus brady on monitor. Alert and oriented.

## 2012-08-18 NOTE — Telephone Encounter (Signed)
plz phone in. 

## 2012-08-18 NOTE — Telephone Encounter (Signed)
Rx called in as directed.   

## 2012-08-18 NOTE — ED Provider Notes (Signed)
CSN: [id number]     Arrival date & time 08/18/12  1916 History     First MD Initiated Contact with Patient 08/18/12 1931     Chief Complaint  Patient presents with  . Chest Pain   (Consider location/radiation/quality/duration/timing/severity/associated sxs/prior Treatment) HPI  79 year old female with a past medical history of GERD, hypertension, diastolic heart dysfunction and chronic kidney disease presents emergency department chief complaint of dyspnea and chest pain.  Patient states that over the past week she has had increasing dyspnea on exertion.  She states that she had difficulty walking to her mailbox and back without shortness of breath.  She states that this is new.  She states that she was a "social smoker."  However she denies smoking packs of cigarettes daily. The patient has also had associated back and central Chest pain.  It is non-dyspnea.  She denies any left jaw, left shoulder or arm pain.  She does have associated paresthesia. Patient states that she was awoken from sleep this morning by the sound of her own wheezing. Denies fevers, chills, myalgias, arthralgias.Denies dysuria, flank pain, suprapubic pain, frequency, urgency, or hematuria. Denies headaches, light headedness, weakness, visual disturbances. Denies abdominal pain, nausea, vomiting, diarrhea or constipation.    Past Medical History  Diagnosis Date  . History of diverticulitis of colon   . HLD (hyperlipidemia)   . Anxiety   . Osteoporosis   . Depression   . GERD (gastroesophageal reflux disease)   . IBS (irritable bowel syndrome)   . Glucose intolerance (impaired glucose tolerance)   . Allergic rhinitis   . Migraine   . HTN (hypertension)     Dr. Martinique, cards  . SUI (stress urinary incontinence, female)     Dr. Amalia Hailey, urology (some urge as well)  . Diverticulosis     colonsocopy 2002 aborted 2/2 tortuous colon and heavy sigmoid diverticulosis  . Dysphagia     EGD 2012 - wnl, s/p esoph dilation   . CKD (chronic kidney disease) stage 3, GFR 30-59 ml/min   . Diastolic dysfunction     per echo in November 2012; normal EF  . DDD (degenerative disc disease), lumbar    Past Surgical History  Procedure Laterality Date  . Tonsillectomy  1942  . Submucous resection  1962  . Tubal ligation  1973  . Breast enhancement surgery  1982  . Total abdominal hysterectomy  1996    for frequent dysmennorhea  . Macroplastique implantation  03/2008    Dr. Rogers Blocker  . Uretherolysis and removal of macroplastique  09/13/08    Dr. Amalia Hailey  . Ct head limited w/o cm  04/2010    nothing acute, ? old infarcts L internal capsule  . Renal doppler  2009?    simple cysts, wnl  . Colonoscopy  2002    does not want another!  . Esophagogastroduodenoscopy  2012    WNL, s/p esoph dilation (Dr. Candace Cruise, [facility])  . US echocardiography  11/2010    EF 55-60%, mild LAE, mild diastolic dysfunction, normal valves   Family History  Problem Relation Age of Onset  . Heart failure Father   . Heart disease Father   . Cancer Father     prostate  . Hypertension Father   . Coronary artery disease Father   . Leukemia Mother     CLL prior   History  Substance Use Topics  . Smoking status: Former Smoker -- 0.50 packs/day for 54 years    Types: Cigarettes    Start  date: 01/18/1954    Quit date: 01/19/2008  . Smokeless tobacco: Never Used  . Alcohol Use: Yes     Comment: 1 1/2-2 glasses white wine/day   OB History   Grav Para Term Preterm Abortions TAB SAB Ect Mult Living                 Review of Systems Ten systems reviewed and are negative for acute change, except as noted in the HPI.  Allergies  Doxycycline monohydrate and Ramipril  Home Medications   Current Outpatient Rx  Name  Route  Sig  Dispense  Refill  . ALPRAZolam (XANAX) 0.25 MG tablet      TAKE ONE TABLET BY MOUTH TWICE DAILY AS NEEDED FOR ANXIETY   60 tablet   0   . aspirin EC 81 MG tablet   Oral   Take 81 mg by mouth daily.         Marland Kitchen b  complex vitamins tablet   Oral   Take 2 tablets by mouth daily.         Marland Kitchen BYSTOLIC 5 MG tablet      TAKE TWO TABLETS BY MOUTH EVERY DAY   60 tablet   6   . cholecalciferol (VITAMIN D) 1000 UNITS tablet   Oral   Take 1,000 Units by mouth daily.           . Coenzyme Q10 (COQ10) 50 MG CAPS   Oral   Take 1 capsule by mouth daily.           Marland Kitchen doxazosin (CARDURA) 2 MG tablet   Oral   Take 1 tablet (2 mg total) by mouth at bedtime.   30 tablet   6   . furosemide (LASIX) 20 MG tablet   Oral   Take 0.5 tablets (10 mg total) by mouth as needed (swelling).   30 tablet   1   . gabapentin (NEURONTIN) 100 MG capsule   Oral   Take 100 mg by mouth 3 (three) times daily.         . hydrALAZINE (APRESOLINE) 25 MG tablet   Oral   Take 0.5 tablets (12.5 mg total) by mouth 3 (three) times daily.   45 tablet   6   . HYDROcodone-acetaminophen (NORCO/VICODIN) 5-325 MG per tablet      TAKE ONE TABLET BY MOUTH TWICE DAILY AS NEEDED   60 tablet   0   . nebivolol (BYSTOLIC) 5 MG tablet   Oral   Take 10 mg by mouth daily.         . NON FORMULARY   Oral   Take 1 tablet by mouth 4 (four) times daily. Sea calcium         . omeprazole (PRILOSEC) 20 MG capsule   Oral   Take 20 mg by mouth daily as needed. For reflux         . zolpidem (AMBIEN) 5 MG tablet      TAKE ONE TABLET BY MOUTH AT BEDTIME AS NEEDED   30 tablet   1    BP 161/98  Pulse 56  Temp(Src) 97.8 F (36.6 C) (Oral)  Resp 18  SpO2 97% Physical Exam Physical Exam  Nursing note and vitals reviewed. Constitutional: She is oriented to person, place, and time. She appears well-developed and well-nourished. No distress.  HENT:  Head: Normocephalic and atraumatic.  Eyes: Conjunctivae normal and EOM are normal. Pupils are equal, round, and reactive to light. No scleral icterus.  Neck: Normal range of motion.  Cardiovascular: Normal rate, regular rhythm and normal heart sounds.  Exam reveals no gallop and  no friction rub.   No murmur heard. Pulmonary/Chest: Effort normal and breath sounds normal. No respiratory distress.  Abdominal: Soft. Bowel sounds are normal. She exhibits no distension and no mass. There is no tenderness. There is no guarding.  Neurological: She is alert and oriented to person, place, and time.  Skin: Skin is warm and dry. She is not diaphoretic.    ED Course   Procedures (including critical care time)  Labs Reviewed  CBC - Abnormal; Notable for the following:    RBC 3.84 (*)    Hemoglobin 11.7 (*)    All other components within normal limits  BASIC METABOLIC PANEL - Abnormal; Notable for the following:    Glucose, Bld 104 (*)    BUN 27 (*)    Creatinine, Ser 1.70 (*)    GFR calc non Af Amer 28 (*)    GFR calc Af Amer 32 (*)    All other components within normal limits  PRO B NATRIURETIC PEPTIDE - Abnormal; Notable for the following:    Pro B Natriuretic peptide (BNP) 2700.0 (*)    All other components within normal limits  POCT I-STAT TROPONIN I   No results found. No diagnosis found.   Date: 08/18/2012  Rate: 51  Rhythm: sinus bradycardia  QRS Axis: normal  Intervals: normal  ST/T Wave abnormalities: flipped t V1/V4  Conduction Disutrbances:none  Narrative Interpretation:   Old EKG Reviewed: unchanged   MDM  9:48 PM BP 157/88  Pulse 47  Temp(Src) 97.8 F (36.6 C) (Oral)  Resp 14  SpO2 92% Patient with elevated BNP, Increasing DOE, Appears to be underlying COPD with possible CHF exacerbation. Creatinine appears to be at baseline.  10:18 PM Patient likely has CHF exacerbation. I will give 40mg  IV lasix.   11:21 PM Patient admitted by Dr.Reddy, Cardiology will consult.   Margarita Mail, PA-C 08/18/12 [id number]

## 2012-08-19 ENCOUNTER — Encounter (HOSPITAL_COMMUNITY): Payer: Self-pay | Admitting: *Deleted

## 2012-08-19 DIAGNOSIS — E785 Hyperlipidemia, unspecified: Secondary | ICD-10-CM | POA: Diagnosis not present

## 2012-08-19 DIAGNOSIS — I519 Heart disease, unspecified: Secondary | ICD-10-CM

## 2012-08-19 DIAGNOSIS — D649 Anemia, unspecified: Secondary | ICD-10-CM

## 2012-08-19 DIAGNOSIS — R079 Chest pain, unspecified: Secondary | ICD-10-CM | POA: Diagnosis not present

## 2012-08-19 DIAGNOSIS — I10 Essential (primary) hypertension: Secondary | ICD-10-CM

## 2012-08-19 DIAGNOSIS — F411 Generalized anxiety disorder: Secondary | ICD-10-CM | POA: Diagnosis not present

## 2012-08-19 LAB — PROTIME-INR
INR: 1.03 (ref 0.00–1.49)
Prothrombin Time: 13.3 seconds (ref 11.6–15.2)

## 2012-08-19 LAB — BASIC METABOLIC PANEL
BUN: 26 mg/dL — ABNORMAL HIGH (ref 6–23)
Chloride: 103 mEq/L (ref 96–112)
GFR calc Af Amer: 32 mL/min — ABNORMAL LOW (ref >90)
GFR calc non Af Amer: 28 mL/min — ABNORMAL LOW (ref >90)
Glucose, Bld: 95 mg/dL (ref 70–99)
Potassium: 4.3 mEq/L (ref 3.5–5.1)
Sodium: 138 mEq/L (ref 135–145)

## 2012-08-19 LAB — CBC
HCT: 36.1 % (ref 36.0–46.0)
Hemoglobin: 12.2 g/dL (ref 12.0–15.0)
MCH: 31.4 pg (ref 26.0–34.0)
MCHC: 33.8 g/dL (ref 30.0–36.0)
RBC: 3.88 MIL/uL (ref 3.87–5.11)

## 2012-08-19 MED ORDER — ONDANSETRON HCL 4 MG PO TABS: 4.0000 mg | ORAL_TABLET | Freq: Four times a day (QID) | ORAL | Status: DC | PRN

## 2012-08-19 MED ORDER — ACETAMINOPHEN 325 MG PO TABS: 650.0000 mg | ORAL_TABLET | Freq: Four times a day (QID) | ORAL | Status: DC | PRN

## 2012-08-19 MED ORDER — ENOXAPARIN SODIUM 30 MG/0.3ML ~~LOC~~ SOLN
30.0000 mg | SUBCUTANEOUS | Status: DC
  Filled 2012-08-19: qty 0.3

## 2012-08-19 MED ORDER — POLYVINYL ALCOHOL 1.4 % OP SOLN
1.0000 [drp] | Freq: Every day | OPHTHALMIC | Status: DC
  Administered 2012-08-19: 1 [drp] via OPHTHALMIC
  Filled 2012-08-19: qty 15

## 2012-08-19 MED ORDER — NEBIVOLOL HCL 10 MG PO TABS
10.0000 mg | ORAL_TABLET | Freq: Every day | ORAL | Status: DC
Start: 2012-08-19 — End: 2012-08-19
  Administered 2012-08-19: 10 mg via ORAL
  Filled 2012-08-19 (×2): qty 1

## 2012-08-19 MED ORDER — HYDRALAZINE HCL 25 MG PO TABS: 12.5000 mg | ORAL_TABLET | Freq: Two times a day (BID) | ORAL | Status: DC

## 2012-08-19 MED ORDER — DOXAZOSIN MESYLATE 2 MG PO TABS
2.0000 mg | ORAL_TABLET | Freq: Every day | ORAL | Status: DC
  Administered 2012-08-19: 2 mg via ORAL
  Filled 2012-08-19 (×2): qty 1

## 2012-08-19 MED ORDER — COQ10 50 MG PO CAPS: 1.0000 | ORAL_CAPSULE | Freq: Every day | ORAL | Status: DC

## 2012-08-19 MED ORDER — VITAMIN D3 25 MCG (1000 UNIT) PO TABS
1000.0000 [IU] | ORAL_TABLET | Freq: Every day | ORAL | Status: DC
  Administered 2012-08-19: 1000 [IU] via ORAL
  Filled 2012-08-19 (×2): qty 1

## 2012-08-19 MED ORDER — ONDANSETRON HCL 4 MG/2ML IJ SOLN: 4.0000 mg | Freq: Four times a day (QID) | INTRAMUSCULAR | Status: DC | PRN

## 2012-08-19 MED ORDER — GLYCERIN-HYPROMELLOSE-PEG 400 0.2-0.2-1 % OP SOLN: Freq: Every day | OPHTHALMIC | Status: DC

## 2012-08-19 MED ORDER — LIVING BETTER WITH HEART FAILURE BOOK
Freq: Once | Status: AC
  Administered 2012-08-19: 03:00:00
  Filled 2012-08-19: qty 1

## 2012-08-19 MED ORDER — GABAPENTIN 100 MG PO CAPS
100.0000 mg | ORAL_CAPSULE | Freq: Three times a day (TID) | ORAL | Status: DC
  Administered 2012-08-19 (×2): 100 mg via ORAL
  Filled 2012-08-19 (×4): qty 1

## 2012-08-19 MED ORDER — B COMPLEX-C PO TABS
2.0000 | ORAL_TABLET | Freq: Every day | ORAL | Status: DC
  Administered 2012-08-19: 2 via ORAL
  Filled 2012-08-19 (×3): qty 2

## 2012-08-19 MED ORDER — ASPIRIN EC 81 MG PO TBEC
81.0000 mg | DELAYED_RELEASE_TABLET | Freq: Every day | ORAL | Status: DC
  Administered 2012-08-19: 81 mg via ORAL
  Filled 2012-08-19: qty 1

## 2012-08-19 MED ORDER — FUROSEMIDE 20 MG PO TABS
10.0000 mg | ORAL_TABLET | ORAL | Status: DC | PRN
  Filled 2012-08-19: qty 0.5

## 2012-08-19 MED ORDER — HYDRALAZINE HCL 25 MG PO TABS
12.5000 mg | ORAL_TABLET | Freq: Three times a day (TID) | ORAL | Status: DC
  Administered 2012-08-19: 12.5 mg via ORAL
  Filled 2012-08-19 (×3): qty 0.5

## 2012-08-19 MED ORDER — ZOLPIDEM TARTRATE 5 MG PO TABS
5.0000 mg | ORAL_TABLET | Freq: Every evening | ORAL | Status: DC | PRN
  Administered 2012-08-19: 5 mg via ORAL
  Filled 2012-08-19: qty 1

## 2012-08-19 MED ORDER — ALPRAZOLAM 0.25 MG PO TABS
0.2500 mg | ORAL_TABLET | Freq: Two times a day (BID) | ORAL | Status: DC | PRN
  Administered 2012-08-19: 0.25 mg via ORAL
  Filled 2012-08-19: qty 1

## 2012-08-19 MED ORDER — B COMPLEX PO TABS: 2.0000 | ORAL_TABLET | Freq: Every day | ORAL | Status: DC

## 2012-08-19 MED ORDER — ACETAMINOPHEN 650 MG RE SUPP: 650.0000 mg | Freq: Four times a day (QID) | RECTAL | Status: DC | PRN

## 2012-08-19 MED ORDER — HYDROCODONE-ACETAMINOPHEN 5-325 MG PO TABS
1.0000 | ORAL_TABLET | Freq: Two times a day (BID) | ORAL | Status: DC | PRN
  Administered 2012-08-19: 0.5 via ORAL
  Administered 2012-08-19 (×2): 1 via ORAL
  Filled 2012-08-19 (×3): qty 1

## 2012-08-19 MED ORDER — SODIUM CHLORIDE 0.9 % IJ SOLN
3.0000 mL | Freq: Two times a day (BID) | INTRAMUSCULAR | Status: DC
  Administered 2012-08-19: 3 mL via INTRAVENOUS

## 2012-08-19 MED ORDER — PANTOPRAZOLE SODIUM 40 MG PO TBEC
40.0000 mg | DELAYED_RELEASE_TABLET | Freq: Every day | ORAL | Status: DC
  Administered 2012-08-19: 40 mg via ORAL
  Filled 2012-08-19 (×2): qty 1

## 2012-08-19 NOTE — Progress Notes (Signed)

## 2012-08-19 NOTE — Consult Note (Signed)
Referring Physician: Dr. Tana Coast, R MD Primary Physician: Ria Bush, MD Primary Cardiologist: Dr. Martinique Peter MD Reason for Consultation: Evaluation of a patient with chest pain shortness of breath and recommendations regarding further management  HPI:  is a 79 year old Caucasian female with past medical history of hypertension poorly controlled with episodes of syncope, hyperlipidemia, CKD stage 3 who presented with chest pain and shortness of breath. According to the patient she started having symptoms while trying to pick her prescriptions at the drugstore. She went home wish to her while because of significant fatigue and shortness of breath. She called her doctor's office for further instructions, at the same time should the blood pressure was up was elevated at 190/100 mmHg. Shortness of breath and fatigue were associated with chest tightness type of discomfort. Prior to the day of presentation patient reports the felt completely fine. She denied any recent PND, orthopnea, palpitations, dysuria, bowel disturbances. Unfortunately she was not able to provide any more details about her shortness of breath or chest pain at the time of my interview . After admission to the hospital to the hospitalist service the patient's blood pressure became better controlled with resolution off of most of her symptoms. Cardiology consult was requested by hospitalist service for further evaluation and management of the patient     Review of Systems: 12 systems were reviewed and were negative except mentioned in history of present illness      Past Medical History  Diagnosis Date  . History of diverticulitis of colon   . HLD (hyperlipidemia)   . Anxiety   . Osteoporosis   . Depression   . GERD (gastroesophageal reflux disease)   . IBS (irritable bowel syndrome)   . Glucose intolerance (impaired glucose tolerance)   . Allergic rhinitis   . Migraine   . HTN (hypertension)     Dr. Martinique, cards  . SUI  (stress urinary incontinence, female)     Dr. Amalia Hailey, urology (some urge as well)  . Diverticulosis     colonsocopy 2002 aborted 2/2 tortuous colon and heavy sigmoid diverticulosis  . Dysphagia     EGD 2012 - wnl, s/p esoph dilation  . CKD (chronic kidney disease) stage 3, GFR 30-59 ml/min   . Diastolic dysfunction     per echo in November 2012; normal EF  . DDD (degenerative disc disease), lumbar    Past Surgical History  Procedure Laterality Date  . Tonsillectomy  1942  . Submucous resection  1962  . Tubal ligation  1973  . Breast enhancement surgery  1982  . Total abdominal hysterectomy  1996    for frequent dysmennorhea  . Macroplastique implantation  03/2008    Dr. Rogers Blocker  . Uretherolysis and removal of macroplastique  09/13/08    Dr. Amalia Hailey  . Ct head limited w/o cm  04/2010    nothing acute, ? old infarcts L internal capsule  . Renal doppler  2009?    simple cysts, wnl  . Colonoscopy  2002    does not want another!  . Esophagogastroduodenoscopy  2012    WNL, s/p esoph dilation (Dr. Candace Cruise, [facility])  . US echocardiography  11/2010    EF 55-60%, mild LAE, mild diastolic dysfunction, normal valves     Current Medications: . aspirin EC  81 mg Oral Daily  . B-complex with vitamin C  2 tablet Oral Daily  . cholecalciferol  1,000 Units Oral Daily  . doxazosin  2 mg Oral QHS  . enoxaparin (LOVENOX) injection  30 mg Subcutaneous Q24H  . gabapentin  100 mg Oral TID  . hydrALAZINE  12.5 mg Oral TID  . Living Better with Heart Failure Book   Does not apply Once  . nebivolol  10 mg Oral Daily  . pantoprazole  40 mg Oral Daily  . polyvinyl alcohol  1 drop Both Eyes Daily  . sodium chloride  3 mL Intravenous Q12H   Infusions:    Prescriptions prior to admission  Medication Sig Dispense Refill  . ALPRAZolam (XANAX) 0.25 MG tablet Take 0.25 mg by mouth 2 (two) times daily as needed for anxiety.      Marland Kitchen aspirin EC 81 MG tablet Take 81 mg by mouth daily.      Marland Kitchen b complex vitamins  tablet Take 2 tablets by mouth daily.      . cholecalciferol (VITAMIN D) 1000 UNITS tablet Take 1,000 Units by mouth daily.        . Coenzyme Q10 (COQ10) 50 MG CAPS Take 1 capsule by mouth daily.        Marland Kitchen doxazosin (CARDURA) 2 MG tablet Take 1 tablet (2 mg total) by mouth at bedtime.  30 tablet  6  . furosemide (LASIX) 20 MG tablet Take 0.5 tablets (10 mg total) by mouth as needed (swelling).  30 tablet  1  . gabapentin (NEURONTIN) 100 MG capsule Take 100 mg by mouth 3 (three) times daily.      . Glycerin-Hypromellose-PEG 400 (VISINE PURE TEARS OP) Place 2 drops into both eyes daily.      . hydrALAZINE (APRESOLINE) 25 MG tablet Take 25 mg by mouth 2 (two) times daily.      Marland Kitchen HYDROcodone-acetaminophen (NORCO/VICODIN) 5-325 MG per tablet Take 1 tablet by mouth 2 (two) times daily as needed for pain.      . nebivolol (BYSTOLIC) 5 MG tablet Take 10 mg by mouth daily.      . nebivolol (BYSTOLIC) 5 MG tablet Take 10 mg by mouth daily.      . NON FORMULARY Take 1 tablet by mouth 4 (four) times daily. Sea calcium      . omeprazole (PRILOSEC) 20 MG capsule Take 20 mg by mouth daily as needed. For reflux      . zolpidem (AMBIEN) 5 MG tablet Take 5 mg by mouth at bedtime as needed for sleep.         Allergies  Allergen Reactions  . Doxycycline Monohydrate Nausea And Vomiting  . Ramipril     REACTION: worsening renal function    History   Social History  . Marital Status: Widowed    Spouse Name: N/A    Number of Children: N/A  . Years of Education: N/A   Occupational History  . Former Personal assistant    Social History Main Topics  . Smoking status: Former Smoker -- 0.50 packs/day for 54 years    Types: Cigarettes    Start date: 01/18/1954    Quit date: 01/19/2008  . Smokeless tobacco: Never Used  . Alcohol Use: Yes     Comment: 1 1/2-2 glasses white wine/day  . Drug Use: No  . Sexually Active: Not Currently   Other Topics Concern  . Not on file   Social History Narrative  . No  narrative on file    Family History  Problem Relation Age of Onset  . Heart failure Father   . Heart disease Father   . Cancer Father     prostate  . Hypertension Father   .  Coronary artery disease Father   . Leukemia Mother     CLL prior   Family Status  Relation Status Death Age  . Father Deceased 80    Was Blind, had pacemaker, CHF, CABG  . Mother Deceased 46  . Sister Alive     71    PHYSICAL EXAM: Filed Vitals:   08/19/12 0534  BP: 125/69  Pulse: 58  Temp: 98.1 F (36.7 C)  Resp: 18     Intake/Output Summary (Last 24 hours) at 08/19/12 0745 Last data filed at 08/19/12 0537  Gross per 24 hour  Intake      0 ml  Output    800 ml  Net   -800 ml    General:  Well appearing. No respiratory difficulty HEENT: normal Neck: supple. no JVD. Carotids 2+ bilat; no bruits. No lymphadenopathy or thryomegaly appreciated. Cor: PMI nondisplaced. Regular rate & rhythm. No rubs, gallops or murmurs. Lungs: clear Abdomen: soft, nontender, nondistended. No hepatosplenomegaly. No bruits or masses. Good bowel sounds. Extremities: no cyanosis, clubbing, rash, edema Neuro: alert & oriented x 3, cranial nerves grossly intact. moves all 4 extremities w/o difficulty. Affect pleasant.  ECG: ECHO: Results for orders placed during the hospital encounter of 08/18/12 (from the past 24 hour(s))  CBC     Status: Abnormal   Collection Time    08/18/12  7:47 PM      Result Value Range   WBC 6.0  4.0 - 10.5 K/uL   RBC 3.84 (*) 3.87 - 5.11 MIL/uL   Hemoglobin 11.7 (*) 12.0 - 15.0 g/dL   HCT 36.0  36.0 - 46.0 %   MCV 93.8  78.0 - 100.0 fL   MCH 30.5  26.0 - 34.0 pg   MCHC 32.5  30.0 - 36.0 g/dL   RDW 13.5  11.5 - 15.5 %   Platelets 238  150 - 400 K/uL  BASIC METABOLIC PANEL     Status: Abnormal   Collection Time    08/18/12  7:47 PM      Result Value Range   Sodium 138  135 - 145 mEq/L   Potassium 4.9  3.5 - 5.1 mEq/L   Chloride 105  96 - 112 mEq/L   CO2 23  19 - 32 mEq/L    Glucose, Bld 104 (*) 70 - 99 mg/dL   BUN 27 (*) 6 - 23 mg/dL   Creatinine, Ser 1.70 (*) 0.50 - 1.10 mg/dL   Calcium 9.7  8.4 - 10.5 mg/dL   GFR calc non Af Amer 28 (*) >90 mL/min   GFR calc Af Amer 32 (*) >90 mL/min  PRO B NATRIURETIC PEPTIDE     Status: Abnormal   Collection Time    08/18/12  7:47 PM      Result Value Range   Pro B Natriuretic peptide (BNP) 2700.0 (*) 0 - 450 pg/mL  POCT I-STAT TROPONIN I     Status: None   Collection Time    08/18/12  7:53 PM      Result Value Range   Troponin i, poc 0.01  0.00 - 0.08 ng/mL   Comment 3           TROPONIN I     Status: None   Collection Time    08/19/12  1:38 AM      Result Value Range   Troponin I <0.30  <0.30 ng/mL  BASIC METABOLIC PANEL     Status: Abnormal   Collection Time  08/19/12  1:38 AM      Result Value Range   Sodium 138  135 - 145 mEq/L   Potassium 4.3  3.5 - 5.1 mEq/L   Chloride 103  96 - 112 mEq/L   CO2 24  19 - 32 mEq/L   Glucose, Bld 95  70 - 99 mg/dL   BUN 26 (*) 6 - 23 mg/dL   Creatinine, Ser 1.69 (*) 0.50 - 1.10 mg/dL   Calcium 9.8  8.4 - 10.5 mg/dL   GFR calc non Af Amer 28 (*) >90 mL/min   GFR calc Af Amer 32 (*) >90 mL/min  CBC     Status: None   Collection Time    08/19/12  1:38 AM      Result Value Range   WBC 6.3  4.0 - 10.5 K/uL   RBC 3.88  3.87 - 5.11 MIL/uL   Hemoglobin 12.2  12.0 - 15.0 g/dL   HCT 36.1  36.0 - 46.0 %   MCV 93.0  78.0 - 100.0 fL   MCH 31.4  26.0 - 34.0 pg   MCHC 33.8  30.0 - 36.0 g/dL   RDW 13.1  11.5 - 15.5 %   Platelets 243  150 - 400 K/uL  PROTIME-INR     Status: None   Collection Time    08/19/12  1:38 AM      Result Value Range   Prothrombin Time 13.3  11.6 - 15.2 seconds   INR 1.03  0.00 - 1.49   Radiology:  Dg Chest 2 View  08/18/2012   *RADIOLOGY REPORT*  Clinical Data: 2-week history of left-sided chest pain.  Former smoker with current history of hypertension.  CHEST - 2 VIEW  Comparison: Two-view chest x-ray 09/04/2011, 08/17/2010.  Findings: Cardiac  silhouette normal in size, unchanged.  Thoracic aorta atherosclerotic, unchanged.  Hilar and mediastinal contours otherwise unremarkable.  Stable hyperinflation.  Lungs clear. Bronchovascular markings normal.  Pulmonary vascularity normal.  No pneumothorax.  No pleural effusions.  Calcified bilateral breast prostheses.  Mild degenerative changes involving the thoracic spine.  IMPRESSION: Hyperinflation consistent with COPD and/or asthma.  No acute cardiopulmonary disease.  Stable examination.   Original Report Authenticated By: Evangeline Dakin, M.D.    ASSESSMENT: Mrs. 79 year old Caucasian female with past medical history of hypertension, hyperlipidemia, diastolic LV dysfunction, CTD stage III who presented with chest pain and shortness of breath associated with an episode of uncontrolled hypertension  Problem list: Chest pain / shortness of breath Hypertension  PLAN/DISCUSSION: Chest pain /shortness of breath Hypertension Appears to be secondary to a episode of hypertensive urgency, after blood pressure was controlled patient's symptoms have improved significantly. There were no evidence of fluid overload on physical exam at the time of my evaluation. - Patient should be safe for discharge from her vascular perspective if she is able to ambulate and perform her usual amount of activities without symptoms, her blood pressure is well-controlled without significant orthostasis - If the above criteria met we'll discharge the patient with followup with Dr. Peter Martinique for consideration of myocardial SPECT CT for ischemic evaluation. - It is unclear why patient's blood pressure increased dramatically on the day of admission, she denies noncompliance with her medications. Patient is disabled to measure her blood pressure on her own therefore she may benefit from instructions regarding when necessary hydralazine as needed for elevated blood pressure to prevent her trips to the emergency department. -  Continue patient's aspirin, would recommend addition of a statin if  tolerated  Inez Pilgrim, MD 08/19/2012 7:45 AM

## 2012-08-19 NOTE — Consult Note (Signed)
Referring Physician: Dr. Tana Coast, R MD Primary Physician: Ria Bush, MD Primary Cardiologist: Dr. Martinique Peter MD Reason for Consultation: Evaluation of a patient with chest pain shortness of breath and recommendations regarding further management  HPI:  is a 79 year old Caucasian female with past medical history of hypertension poorly controlled with episodes of syncope, hyperlipidemia, CKD stage 3 who presented with chest pain and shortness of breath. According to the patient she started having symptoms while trying to pick her prescriptions at the drugstore. She went home wish to her while because of significant fatigue and shortness of breath. She called her doctor's office for further instructions, at the same time should the blood pressure was up was elevated at 190/100 mmHg. Shortness of breath and fatigue were associated with chest tightness type of discomfort. Prior to the day of presentation patient reports the felt completely fine. She denied any recent PND, orthopnea, palpitations, dysuria, bowel disturbances. Unfortunately she was not able to provide any more details about her shortness of breath or chest pain at the time of my interview . After admission to the hospital to the hospitalist service the patient's blood pressure became better controlled with resolution off of most of her symptoms. Cardiology consult was requested by hospitalist service for further evaluation and management of the patient     Review of Systems: 12 systems were reviewed and were negative except mentioned in history of present illness      Past Medical History  Diagnosis Date  . History of diverticulitis of colon   . HLD (hyperlipidemia)   . Anxiety   . Osteoporosis   . Depression   . GERD (gastroesophageal reflux disease)   . IBS (irritable bowel syndrome)   . Glucose intolerance (impaired glucose tolerance)   . Allergic rhinitis   . Migraine   . HTN (hypertension)     Dr. Martinique, cards  . SUI  (stress urinary incontinence, female)     Dr. Amalia Hailey, urology (some urge as well)  . Diverticulosis     colonsocopy 2002 aborted 2/2 tortuous colon and heavy sigmoid diverticulosis  . Dysphagia     EGD 2012 - wnl, s/p esoph dilation  . CKD (chronic kidney disease) stage 3, GFR 30-59 ml/min   . Diastolic dysfunction     per echo in November 2012; normal EF  . DDD (degenerative disc disease), lumbar    Past Surgical History  Procedure Laterality Date  . Tonsillectomy  1942  . Submucous resection  1962  . Tubal ligation  1973  . Breast enhancement surgery  1982  . Total abdominal hysterectomy  1996    for frequent dysmennorhea  . Macroplastique implantation  03/2008    Dr. Rogers Blocker  . Uretherolysis and removal of macroplastique  09/13/08    Dr. Amalia Hailey  . Ct head limited w/o cm  04/2010    nothing acute, ? old infarcts L internal capsule  . Renal doppler  2009?    simple cysts, wnl  . Colonoscopy  2002    does not want another!  . Esophagogastroduodenoscopy  2012    WNL, s/p esoph dilation (Dr. Candace Cruise, [facility])  . US echocardiography  11/2010    EF 55-60%, mild LAE, mild diastolic dysfunction, normal valves     Current Medications: . aspirin EC  81 mg Oral Daily  . B-complex with vitamin C  2 tablet Oral Daily  . cholecalciferol  1,000 Units Oral Daily  . doxazosin  2 mg Oral QHS  . enoxaparin (LOVENOX) injection  30 mg Subcutaneous Q24H  . gabapentin  100 mg Oral TID  . hydrALAZINE  12.5 mg Oral TID  . nebivolol  10 mg Oral Daily  . pantoprazole  40 mg Oral Daily  . polyvinyl alcohol  1 drop Both Eyes Daily  . sodium chloride  3 mL Intravenous Q12H   Infusions:    Prescriptions prior to admission  Medication Sig Dispense Refill  . ALPRAZolam (XANAX) 0.25 MG tablet Take 0.25 mg by mouth 2 (two) times daily as needed for anxiety.      Marland Kitchen aspirin EC 81 MG tablet Take 81 mg by mouth daily.      Marland Kitchen b complex vitamins tablet Take 2 tablets by mouth daily.      . cholecalciferol  (VITAMIN D) 1000 UNITS tablet Take 1,000 Units by mouth daily.        . Coenzyme Q10 (COQ10) 50 MG CAPS Take 1 capsule by mouth daily.        Marland Kitchen doxazosin (CARDURA) 2 MG tablet Take 1 tablet (2 mg total) by mouth at bedtime.  30 tablet  6  . furosemide (LASIX) 20 MG tablet Take 0.5 tablets (10 mg total) by mouth as needed (swelling).  30 tablet  1  . gabapentin (NEURONTIN) 100 MG capsule Take 100 mg by mouth 3 (three) times daily.      . Glycerin-Hypromellose-PEG 400 (VISINE PURE TEARS OP) Place 2 drops into both eyes daily.      . hydrALAZINE (APRESOLINE) 25 MG tablet Take 12.5 mg by mouth 2 (two) times daily.      Marland Kitchen HYDROcodone-acetaminophen (NORCO/VICODIN) 5-325 MG per tablet Take 1 tablet by mouth 2 (two) times daily as needed for pain.      . nebivolol (BYSTOLIC) 5 MG tablet Take 10 mg by mouth daily.      . NON FORMULARY Take 1 tablet by mouth 4 (four) times daily. Sea calcium      . omeprazole (PRILOSEC) 20 MG capsule Take 20 mg by mouth daily as needed. For reflux      . zolpidem (AMBIEN) 5 MG tablet Take 5 mg by mouth at bedtime as needed for sleep.         Allergies  Allergen Reactions  . Doxycycline Monohydrate Nausea And Vomiting  . Ramipril     REACTION: worsening renal function    History   Social History  . Marital Status: Widowed    Spouse Name: N/A    Number of Children: N/A  . Years of Education: N/A   Occupational History  . Former Personal assistant    Social History Main Topics  . Smoking status: Former Smoker -- 0.50 packs/day for 54 years    Types: Cigarettes    Start date: 01/18/1954    Quit date: 01/19/2008  . Smokeless tobacco: Never Used  . Alcohol Use: Yes     Comment: 1 1/2-2 glasses white wine/day  . Drug Use: No  . Sexually Active: Not Currently   Other Topics Concern  . Not on file   Social History Narrative  . No narrative on file    Family History  Problem Relation Age of Onset  . Heart failure Father   . Heart disease Father   .  Cancer Father     prostate  . Hypertension Father   . Coronary artery disease Father   . Leukemia Mother     CLL prior   Family Status  Relation Status Death Age  . Father Deceased  106    Was Blind, had pacemaker, CHF, CABG  . Mother Deceased 32  . Sister Alive     70    PHYSICAL EXAM: Filed Vitals:   08/19/12 0534  BP: 125/69  Pulse: 58  Temp: 98.1 F (36.7 C)  Resp: 18     Intake/Output Summary (Last 24 hours) at 08/19/12 0923 Last data filed at 08/19/12 0901  Gross per 24 hour  Intake    480 ml  Output    950 ml  Net   -470 ml    General:  Well appearing. No respiratory difficulty HEENT: normal Neck: supple. no JVD. Carotids 2+ bilat; no bruits. No lymphadenopathy or thryomegaly appreciated. Cor: PMI nondisplaced. Regular rate & rhythm. No rubs, gallops or murmurs. Lungs: clear Abdomen: soft, nontender, nondistended. No hepatosplenomegaly. No bruits or masses. Good bowel sounds. Extremities: no cyanosis, clubbing, rash, edema Neuro: alert & oriented x 3, cranial nerves grossly intact. moves all 4 extremities w/o difficulty. Affect pleasant.  ECG: ECHO: Results for orders placed during the hospital encounter of 08/18/12 (from the past 24 hour(s))  CBC     Status: Abnormal   Collection Time    08/18/12  7:47 PM      Result Value Range   WBC 6.0  4.0 - 10.5 K/uL   RBC 3.84 (*) 3.87 - 5.11 MIL/uL   Hemoglobin 11.7 (*) 12.0 - 15.0 g/dL   HCT 36.0  36.0 - 46.0 %   MCV 93.8  78.0 - 100.0 fL   MCH 30.5  26.0 - 34.0 pg   MCHC 32.5  30.0 - 36.0 g/dL   RDW 13.5  11.5 - 15.5 %   Platelets 238  150 - 400 K/uL  BASIC METABOLIC PANEL     Status: Abnormal   Collection Time    08/18/12  7:47 PM      Result Value Range   Sodium 138  135 - 145 mEq/L   Potassium 4.9  3.5 - 5.1 mEq/L   Chloride 105  96 - 112 mEq/L   CO2 23  19 - 32 mEq/L   Glucose, Bld 104 (*) 70 - 99 mg/dL   BUN 27 (*) 6 - 23 mg/dL   Creatinine, Ser 1.70 (*) 0.50 - 1.10 mg/dL   Calcium 9.7  8.4 -  10.5 mg/dL   GFR calc non Af Amer 28 (*) >90 mL/min   GFR calc Af Amer 32 (*) >90 mL/min  PRO B NATRIURETIC PEPTIDE     Status: Abnormal   Collection Time    08/18/12  7:47 PM      Result Value Range   Pro B Natriuretic peptide (BNP) 2700.0 (*) 0 - 450 pg/mL  POCT I-STAT TROPONIN I     Status: None   Collection Time    08/18/12  7:53 PM      Result Value Range   Troponin i, poc 0.01  0.00 - 0.08 ng/mL   Comment 3           TROPONIN I     Status: None   Collection Time    08/19/12  1:38 AM      Result Value Range   Troponin I <0.30  <0.30 ng/mL  BASIC METABOLIC PANEL     Status: Abnormal   Collection Time    08/19/12  1:38 AM      Result Value Range   Sodium 138  135 - 145 mEq/L   Potassium 4.3  3.5 - 5.1  mEq/L   Chloride 103  96 - 112 mEq/L   CO2 24  19 - 32 mEq/L   Glucose, Bld 95  70 - 99 mg/dL   BUN 26 (*) 6 - 23 mg/dL   Creatinine, Ser 1.69 (*) 0.50 - 1.10 mg/dL   Calcium 9.8  8.4 - 10.5 mg/dL   GFR calc non Af Amer 28 (*) >90 mL/min   GFR calc Af Amer 32 (*) >90 mL/min  CBC     Status: None   Collection Time    08/19/12  1:38 AM      Result Value Range   WBC 6.3  4.0 - 10.5 K/uL   RBC 3.88  3.87 - 5.11 MIL/uL   Hemoglobin 12.2  12.0 - 15.0 g/dL   HCT 36.1  36.0 - 46.0 %   MCV 93.0  78.0 - 100.0 fL   MCH 31.4  26.0 - 34.0 pg   MCHC 33.8  30.0 - 36.0 g/dL   RDW 13.1  11.5 - 15.5 %   Platelets 243  150 - 400 K/uL  PROTIME-INR     Status: None   Collection Time    08/19/12  1:38 AM      Result Value Range   Prothrombin Time 13.3  11.6 - 15.2 seconds   INR 1.03  0.00 - 1.49   Radiology:  Dg Chest 2 View  08/18/2012   *RADIOLOGY REPORT*  Clinical Data: 2-week history of left-sided chest pain.  Former smoker with current history of hypertension.  CHEST - 2 VIEW  Comparison: Two-view chest x-ray 09/04/2011, 08/17/2010.  Findings: Cardiac silhouette normal in size, unchanged.  Thoracic aorta atherosclerotic, unchanged.  Hilar and mediastinal contours otherwise  unremarkable.  Stable hyperinflation.  Lungs clear. Bronchovascular markings normal.  Pulmonary vascularity normal.  No pneumothorax.  No pleural effusions.  Calcified bilateral breast prostheses.  Mild degenerative changes involving the thoracic spine.  IMPRESSION: Hyperinflation consistent with COPD and/or asthma.  No acute cardiopulmonary disease.  Stable examination.   Original Report Authenticated By: Evangeline Dakin, M.D.    ASSESSMENT: Mrs. 79 year old Caucasian female with past medical history of hypertension, hyperlipidemia, diastolic LV dysfunction, CTD stage III who presented with chest pain and shortness of breath associated with an episode of uncontrolled hypertension  1. Dyspnea:  Agree that this episode of dyspnea was likely due to marked HTN.  She is clearly better now.  She may also have some degree of COPD (CXR is suggestive of COPD).  No wheezing on exam.    She should be able to go home today if she is able to ambulate without problems.    Follow up with dr. Martinique in the office.    Darden Amber., MD 08/19/2012 9:23 AM

## 2012-08-19 NOTE — Discharge Summary (Signed)
Physician Discharge Summary  Patient ID: Nicole English MRN: [id number] DOB/AGE: 77-Mar-1934 77 y.o.  Admit date: 08/18/2012 Discharge date: 08/19/2012  Primary Care Physician:  Ria Bush, MD  Discharge Diagnoses:   Dyspnea likely secondary to accelerated hypertension, resolved  . Chest pain . COPD, severe obstruction . CKD (chronic kidney disease) stage 3, GFR 30-59 ml/min . Anxiety . Depression . Back pain . HTN (hypertension) . HLD (hyperlipidemia) . Bradycardia  Consults: Cardiology   Recommendations for Outpatient Follow-up:  1. patient was recommended to take an extra pill of hydralazine if her BP is elevated (SBP>165, DBP >100). She otherwise does not want to take hydralazine TID. 2. patient was noticed to have COPD on chest x-ray but no prior documentation. She currently has no wheezing, may need to refer to pulmonology if she has dyspnea or wheezing in future.   Allergies:   Allergies  Allergen Reactions  . Doxycycline Monohydrate Nausea And Vomiting  . Ramipril     REACTION: worsening renal function     Discharge Medications:   Medication List         ALPRAZolam 0.25 MG tablet  Commonly known as:  XANAX  Take 0.25 mg by mouth 2 (two) times daily as needed for anxiety.     aspirin EC 81 MG tablet  Take 81 mg by mouth daily.     b complex vitamins tablet  Take 2 tablets by mouth daily.     cholecalciferol 1000 UNITS tablet  Commonly known as:  VITAMIN D  Take 1,000 Units by mouth daily.     CoQ10 50 MG Caps  Take 1 capsule by mouth daily.     doxazosin 2 MG tablet  Commonly known as:  CARDURA  Take 1 tablet (2 mg total) by mouth at bedtime.     furosemide 20 MG tablet  Commonly known as:  LASIX  Take 0.5 tablets (10 mg total) by mouth as needed (swelling).     gabapentin 100 MG capsule  Commonly known as:  NEURONTIN  Take 100 mg by mouth 3 (three) times daily.     hydrALAZINE 25 MG tablet  Commonly known as:  APRESOLINE  Take 0.5  tablets (12.5 mg total) by mouth 2 (two) times daily. Take one extra pill if BP elevated, see instructions     HYDROcodone-acetaminophen 5-325 MG per tablet  Commonly known as:  NORCO/VICODIN  Take 1 tablet by mouth 2 (two) times daily as needed for pain.     nebivolol 5 MG tablet  Commonly known as:  BYSTOLIC  Take 10 mg by mouth daily.     NON FORMULARY  Take 1 tablet by mouth 4 (four) times daily. Sea calcium     omeprazole 20 MG capsule  Commonly known as:  PRILOSEC  Take 20 mg by mouth daily as needed. For reflux     VISINE PURE TEARS OP  Place 2 drops into both eyes daily.     zolpidem 5 MG tablet  Commonly known as:  AMBIEN  Take 5 mg by mouth at bedtime as needed for sleep.         Brief H and P: For complete details please refer to admission H and P, but in [place] is a 79 y.o. Caucasian female with history of hyperlipidemia, anxiety, depression, GERD, hypertension, bradycardia, chronic kidney disease stage III, COPD, and history of tobacco abuse (former smoker quit 10 years ago) who presents with complaints of chest pain. Patient reports that she  has been having some mid back pain for the last few days. She noted pain to her shoulder blades yesterday. Today she was changing her clothes in the afternoon between 3-5 p.m. and she noted chest pressure sensation. The pain did not radiate down her left arm. She was not nondiaphoretic or any jaw pain. She presented to the emergency department for further evaluation.   Hospital Course:   Chest pain: Atypical, patient was admitted, serial cardiac enzymes were obtained which were negative. Patient did have a BNP of 2700 at the time of admission and was given one dose of IV Lasix in the ER. Patient had an echo in March of 2014 which showed a cavity size was normal, EF was 123456 systolic function normal. Cardiology consultation was obtained, who felt that the patient's chest pain/shortness of breath appeared to be  secondary to the episode of hypertensive urgency. After the BP was controlled in the ER patient's symptoms had improved significantly and there was no evidence of fluid overload on physical exam. Patient was cleared by cardiology for discharge. She was instructed to take one extra pill of hydralazine (normally takes 12.5 mg BID) if her BP was elevated. She did not want to be on TID dosing. She was continued on aspirin and beta blocker. Patient will follow up with Dr. Martinique and per cardiology, may need myocardial SPECT CT for any ischemic evaluation.  COPD: Appear to be severe on chest x-ray although patient did not have any wheezing at the time of my examination. The patient wanted to discuss with her primary care physician before being placed on any inhalers. She may need pulmonology referral if her dyspnea/wheezing returns.  Patient ambulated in the hallway with the nurse, had no worsening of chest pain or any dyspnea, blood pressure remained stable. Orthostatic vitals were also negative.   Day of Discharge BP 140/83  Pulse 60  Temp(Src) 98.1 F (36.7 C) (Oral)  Resp 18  Ht 5\' 2"  (1.575 m)  Wt 57.425 kg (126 lb 9.6 oz)  BMI 23.15 kg/m2  SpO2 97%  Physical Exam: General: Alert and awake oriented x3 not in any acute distress. CVS: S1-S2 clear no murmur rubs or gallops Chest: clear to auscultation bilaterally, no wheezing rales or rhonchi Abdomen: soft nontender, nondistended, normal bowel sounds Extremities: no cyanosis, clubbing or edema noted bilaterally Neuro: Cranial nerves II-XII intact, no focal neurological deficits   The results of significant diagnostics from this hospitalization (including imaging, microbiology, ancillary and laboratory) are listed below for reference.    LAB RESULTS: Basic Metabolic Panel:  Recent Labs Lab 08/18/12 1947 08/19/12 0138  NA 138 138  K 4.9 4.3  CL 105 103  CO2 23 24  GLUCOSE 104* 95  BUN 27* 26*  CREATININE 1.70* 1.69*  CALCIUM 9.7  9.8   Liver Function Tests: No results found for this basename: AST, ALT, ALKPHOS, BILITOT, PROT, ALBUMIN,  in the last 168 hours No results found for this basename: LIPASE, AMYLASE,  in the last 168 hours No results found for this basename: AMMONIA,  in the last 168 hours CBC:  Recent Labs Lab 08/18/12 1947 08/19/12 0138  WBC 6.0 6.3  HGB 11.7* 12.2  HCT 36.0 36.1  MCV 93.8 93.0  PLT 238 243   Cardiac Enzymes:  Recent Labs Lab 08/19/12 0138 08/19/12 0852  TROPONINI <0.30 <0.30   BNP: No components found with this basename: POCBNP,  CBG: No results found for this basename: GLUCAP,  in the last 168 hours  Significant Diagnostic Studies:  Dg Chest 2 View  08/18/2012   *RADIOLOGY REPORT*  Clinical Data: 2-week history of left-sided chest pain.  Former smoker with current history of hypertension.  CHEST - 2 VIEW  Comparison: Two-view chest x-ray 09/04/2011, 08/17/2010.  Findings: Cardiac silhouette normal in size, unchanged.  Thoracic aorta atherosclerotic, unchanged.  Hilar and mediastinal contours otherwise unremarkable.  Stable hyperinflation.  Lungs clear. Bronchovascular markings normal.  Pulmonary vascularity normal.  No pneumothorax.  No pleural effusions.  Calcified bilateral breast prostheses.  Mild degenerative changes involving the thoracic spine.  IMPRESSION: Hyperinflation consistent with COPD and/or asthma.  No acute cardiopulmonary disease.  Stable examination.   Original Report Authenticated By: Evangeline Dakin, M.D.       Disposition and Follow-up:     Discharge Orders   Future Appointments Provider Department Dept Phone   11/13/2012 11:45 AM Peter M Martinique, MD [place]) [phone]   Future Orders Complete By Expires     Diet - low sodium heart healthy  As directed     Discharge instructions  As directed     Comments:      Please check BP twice a day. If SBP >165 or DBP>100, take extra pill of hydralazine.    Increase  activity slowly  As directed         DISPOSITION: Home DIET: Heart healthy diet ACTIVITY: As tolerated  DISCHARGE FOLLOW-UP Follow-up Information   Follow up with Ria Bush, MD. Schedule an appointment as soon as possible for a visit in 10 days. (for hospital follow-up)    Contact information:   [place] [address] [phone]       Follow up with Peter Martinique, MD. Schedule an appointment as soon as possible for a visit in 2 weeks. (for follow-up)    Contact information:   [address] [place] [address] [phone]       Time spent on Discharge: 35 mins  Signed:   RAI,RIPUDEEP M.D. [facility] 08/19/2012, 10:54 AM Pager: [phone]

## 2012-08-20 ENCOUNTER — Encounter: Payer: Self-pay | Admitting: Family Medicine

## 2012-08-20 NOTE — Telephone Encounter (Signed)
Noted.  Pt hospitalized with chest discomfort and dyspnea.  discharged on Saturday Can we call to schedule f/u within 2 wks and ensure no questions with new addition of hydralazine prn BP >165/100

## 2012-08-21 ENCOUNTER — Telehealth: Payer: Self-pay | Admitting: Family Medicine

## 2012-08-21 ENCOUNTER — Encounter (HOSPITAL_COMMUNITY): Payer: Self-pay | Admitting: *Deleted

## 2012-08-21 ENCOUNTER — Emergency Department (HOSPITAL_COMMUNITY)
Admission: EM | Admit: 2012-08-21 | Discharge: 2012-08-22 | Disposition: A | Discharge status: Home / Self Care | Payer: Medicare Other | Attending: Emergency Medicine | Admitting: Emergency Medicine

## 2012-08-21 ENCOUNTER — Emergency Department (HOSPITAL_COMMUNITY): Payer: Medicare Other

## 2012-08-21 DIAGNOSIS — R1032 Left lower quadrant pain: Secondary | ICD-10-CM | POA: Insufficient documentation

## 2012-08-21 DIAGNOSIS — M542 Cervicalgia: Secondary | ICD-10-CM | POA: Insufficient documentation

## 2012-08-21 DIAGNOSIS — Z87448 Personal history of other diseases of urinary system: Secondary | ICD-10-CM | POA: Diagnosis not present

## 2012-08-21 DIAGNOSIS — N183 Chronic kidney disease, stage 3 unspecified: Secondary | ICD-10-CM | POA: Diagnosis not present

## 2012-08-21 DIAGNOSIS — I129 Hypertensive chronic kidney disease with stage 1 through stage 4 chronic kidney disease, or unspecified chronic kidney disease: Secondary | ICD-10-CM | POA: Insufficient documentation

## 2012-08-21 DIAGNOSIS — G43909 Migraine, unspecified, not intractable, without status migrainosus: Secondary | ICD-10-CM | POA: Diagnosis not present

## 2012-08-21 DIAGNOSIS — M810 Age-related osteoporosis without current pathological fracture: Secondary | ICD-10-CM | POA: Insufficient documentation

## 2012-08-21 DIAGNOSIS — F411 Generalized anxiety disorder: Secondary | ICD-10-CM | POA: Insufficient documentation

## 2012-08-21 DIAGNOSIS — J4489 Other specified chronic obstructive pulmonary disease: Secondary | ICD-10-CM | POA: Insufficient documentation

## 2012-08-21 DIAGNOSIS — Z8719 Personal history of other diseases of the digestive system: Secondary | ICD-10-CM | POA: Insufficient documentation

## 2012-08-21 DIAGNOSIS — J449 Chronic obstructive pulmonary disease, unspecified: Secondary | ICD-10-CM | POA: Diagnosis not present

## 2012-08-21 DIAGNOSIS — Z87891 Personal history of nicotine dependence: Secondary | ICD-10-CM | POA: Diagnosis not present

## 2012-08-21 DIAGNOSIS — K219 Gastro-esophageal reflux disease without esophagitis: Secondary | ICD-10-CM | POA: Insufficient documentation

## 2012-08-21 DIAGNOSIS — M549 Dorsalgia, unspecified: Secondary | ICD-10-CM | POA: Insufficient documentation

## 2012-08-21 DIAGNOSIS — Z79899 Other long term (current) drug therapy: Secondary | ICD-10-CM | POA: Insufficient documentation

## 2012-08-21 DIAGNOSIS — R079 Chest pain, unspecified: Secondary | ICD-10-CM | POA: Diagnosis not present

## 2012-08-21 DIAGNOSIS — R0789 Other chest pain: Secondary | ICD-10-CM | POA: Insufficient documentation

## 2012-08-21 DIAGNOSIS — Z862 Personal history of diseases of the blood and blood-forming organs and certain disorders involving the immune mechanism: Secondary | ICD-10-CM | POA: Insufficient documentation

## 2012-08-21 DIAGNOSIS — R930 Abnormal findings on diagnostic imaging of skull and head, not elsewhere classified: Secondary | ICD-10-CM | POA: Diagnosis not present

## 2012-08-21 DIAGNOSIS — I519 Heart disease, unspecified: Secondary | ICD-10-CM | POA: Diagnosis not present

## 2012-08-21 DIAGNOSIS — E860 Dehydration: Secondary | ICD-10-CM | POA: Diagnosis not present

## 2012-08-21 DIAGNOSIS — F3289 Other specified depressive episodes: Secondary | ICD-10-CM | POA: Insufficient documentation

## 2012-08-21 DIAGNOSIS — Z8739 Personal history of other diseases of the musculoskeletal system and connective tissue: Secondary | ICD-10-CM | POA: Diagnosis not present

## 2012-08-21 DIAGNOSIS — R209 Unspecified disturbances of skin sensation: Secondary | ICD-10-CM | POA: Diagnosis not present

## 2012-08-21 DIAGNOSIS — Z8639 Personal history of other endocrine, nutritional and metabolic disease: Secondary | ICD-10-CM | POA: Insufficient documentation

## 2012-08-21 DIAGNOSIS — R448 Other symptoms and signs involving general sensations and perceptions: Secondary | ICD-10-CM

## 2012-08-21 DIAGNOSIS — Z8709 Personal history of other diseases of the respiratory system: Secondary | ICD-10-CM | POA: Diagnosis not present

## 2012-08-21 DIAGNOSIS — Z7982 Long term (current) use of aspirin: Secondary | ICD-10-CM | POA: Insufficient documentation

## 2012-08-21 DIAGNOSIS — F329 Major depressive disorder, single episode, unspecified: Secondary | ICD-10-CM | POA: Diagnosis not present

## 2012-08-21 LAB — CBC
HCT: 34.9 % — ABNORMAL LOW (ref 36.0–46.0)
Hemoglobin: 11.6 g/dL — ABNORMAL LOW (ref 12.0–15.0)
MCH: 30.8 pg (ref 26.0–34.0)
MCHC: 33.2 g/dL (ref 30.0–36.0)
MCV: 92.6 fL (ref 78.0–100.0)
Platelets: 246 K/uL (ref 150–400)
RBC: 3.77 MIL/uL — ABNORMAL LOW (ref 3.87–5.11)
RDW: 13.1 % (ref 11.5–15.5)
WBC: 6.3 K/uL (ref 4.0–10.5)

## 2012-08-21 LAB — BASIC METABOLIC PANEL
BUN: 42 mg/dL — ABNORMAL HIGH (ref 6–23)
Chloride: 100 mEq/L (ref 96–112)
Creatinine, Ser: 2.07 mg/dL — ABNORMAL HIGH (ref 0.50–1.10)
GFR calc Af Amer: 25 mL/min — ABNORMAL LOW (ref >90)

## 2012-08-21 MED ORDER — HYDRALAZINE HCL 10 MG PO TABS
10.0000 mg | ORAL_TABLET | Freq: Once | ORAL | Status: AC
  Administered 2012-08-22: 10 mg via ORAL
  Filled 2012-08-21: qty 1

## 2012-08-21 NOTE — ED Notes (Signed)
Patient transported to X-ray 

## 2012-08-21 NOTE — ED Notes (Signed)
H/o anxiety (and other see list), also dysphagia, takes lasix, denies sob. Pt alert, NAD, calm, interactive, laughing.

## 2012-08-21 NOTE — Telephone Encounter (Signed)
Spoke with pt, see TCC note.

## 2012-08-21 NOTE — Telephone Encounter (Signed)
Left vm for pt to return call  

## 2012-08-21 NOTE — Telephone Encounter (Signed)
Transitional Care Call.  Pt d/c'd from hospital on 08/19/12.  Admitting dx: chest pain and dyspnea.  Pt states she has not had any further SOB or chest discomfort.  She states that she has been avoiding strenuous activity but is able to ambulate and perform ADLs independently.  Pt denies questions regarding d/c instructions or medication list.  Pt verbalized understanding to take extra hydralazine if BP > 165/100 (she did not have these parameters listed on her paperwork) and is checking BP twice a day per instructions.  Denies any questions for PCP.  Follow-up appt scheduled for 8/5.

## 2012-08-21 NOTE — ED Notes (Addendum)
Recent admission over this past weekend for BP. Here today for c/o numbness circumoral around mouth, also R sided neck and back pain, "difficult to describe", also reports "other various sx unable to describe". H/o bilateral cataract eye surgery, PERRL 16mm brisk, MAEx4, A&Ox4, skin W&D, no droop or drift, speech clear, grip strength and coordination are equal and strong. Neck and back pain described as muscular, along spine, intermitent, 5/10. Alert, NAD, calm, interactive, sx onset around 1500. Now mentions chest pressure and dry mouth.

## 2012-08-22 ENCOUNTER — Ambulatory Visit: Payer: Medicare Other | Admitting: Family Medicine

## 2012-08-22 ENCOUNTER — Encounter: Payer: Self-pay | Admitting: Family Medicine

## 2012-08-22 ENCOUNTER — Ambulatory Visit (INDEPENDENT_AMBULATORY_CARE_PROVIDER_SITE_OTHER): Payer: Medicare Other | Admitting: Family Medicine

## 2012-08-22 ENCOUNTER — Emergency Department (HOSPITAL_COMMUNITY): Payer: Medicare Other

## 2012-08-22 VITALS — BP 130/80 | HR 52 | Temp 97.9°F | Wt 128.2 lb

## 2012-08-22 DIAGNOSIS — M25511 Pain in right shoulder: Secondary | ICD-10-CM

## 2012-08-22 DIAGNOSIS — J449 Chronic obstructive pulmonary disease, unspecified: Secondary | ICD-10-CM

## 2012-08-22 DIAGNOSIS — R930 Abnormal findings on diagnostic imaging of skull and head, not elsewhere classified: Secondary | ICD-10-CM | POA: Diagnosis not present

## 2012-08-22 DIAGNOSIS — I10 Essential (primary) hypertension: Secondary | ICD-10-CM

## 2012-08-22 NOTE — Progress Notes (Signed)
Subjective:    Patient ID: Nicole English, female    DOB: February 05, 1932, 79 y.o.   MRN: [id number]  HPI CC: ER f/u  Admitted to hospital over weekend.  Dx with possible CHF exac (BNP 2700) and chest pain attributed to hypertensive urgency.  Diuresed with lasix.  Most recently seen this morning at ER again with paresthesias, neck pain, back pain, and dehydration.  Cr elevated from her baseline to 2.0 (after recent lasix).  States she has had chronic "nerve ending pain" of bilateral superior shoulders for which she takes gabapentin. Lab Results  Component Value Date   CREATININE 2.07* 08/21/2012   Did not get any sleep overnight in ER.  Exhausted today.  Currently endorses lower back pain and "nerve ending pain" on superior shoulders and neck described as shocklike electricity.  Longstanding pain. Currently denies nausea, chest discomfort, leg swelling, dyspnea.  Denies fevers/chills, unilateral numbness or weakness, or slurred speech or AMS.  No nausea or diarrhea recently. Endorses good PO intake at home.  Able to drink 1 cup water in office today.  Known COPD - on spirometry 01/2012.  Never tried previously prescribed foradil.  Discussed resolution of current sxs prior to trail in future.  Has been taking extra hydralazine 12.5mg  PRN bp >165/100. Compliant with other antihypertensives including bystolic 5mg  bid and hydralazine 12.5mg  bid.  All ER and hospital records reviewed. CXR x2, head CT, EKG and blood work including negative cardiac enzymes x3  Wt Readings from Last 3 Encounters:  08/22/12 128 lb 4 oz (58.174 kg)  08/19/12 126 lb 9.6 oz (57.425 kg)  05/12/12 131 lb 1.9 oz (59.476 kg)     Admit date: 08/18/2012  Discharge date: 08/19/2012  F/u phone call: 08/21/2012  Discharge Diagnoses:  Dyspnea likely secondary to accelerated hypertension, resolved  . Chest pain . COPD, severe obstruction . CKD (chronic kidney disease) stage 3, GFR 30-59 ml/min . Anxiety . Depression . Back  pain . HTN (hypertension) . HLD (hyperlipidemia) . Bradycardia  Consults: Cardiology  Recommendations for Outpatient Follow-up:  1. patient was recommended to take an extra pill of hydralazine if her BP is elevated (SBP>165, DBP >100). She otherwise does not want to take hydralazine TID.  2. patient was noticed to have COPD on chest x-ray but no prior documentation. She currently has no wheezing, may need to refer to pulmonology if she has dyspnea or wheezing in future.   Past Medical History  Diagnosis Date  . History of diverticulitis of colon   . HLD (hyperlipidemia)   . Anxiety   . Osteoporosis   . Depression   . GERD (gastroesophageal reflux disease)   . IBS (irritable bowel syndrome)   . Glucose intolerance (impaired glucose tolerance)   . Allergic rhinitis   . Migraine   . HTN (hypertension)     Dr. Martinique, cards  . SUI (stress urinary incontinence, female)     Dr. Amalia Hailey, urology (some urge as well)  . Diverticulosis     colonsocopy 2002 aborted 2/2 tortuous colon and heavy sigmoid diverticulosis  . Dysphagia     EGD 2012 - wnl, s/p esoph dilation  . CKD (chronic kidney disease) stage 3, GFR 30-59 ml/min   . Diastolic dysfunction     per echo in November 2012; normal EF  . DDD (degenerative disc disease), lumbar   . 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    Past Surgical  History  Procedure Laterality Date  . Tonsillectomy  1942  . Submucous resection  1962  . Tubal ligation  1973  . Breast enhancement surgery  1982  . Total abdominal hysterectomy  1996    for frequent dysmennorhea  . Macroplastique implantation  03/2008    Dr. Rogers Blocker  . Uretherolysis and removal of macroplastique  09/13/08    Dr. Amalia Hailey  . Ct head limited w/o cm  04/2010    nothing acute, ? old infarcts L internal capsule  . Renal doppler  2009?    simple cysts, wnl  . Colonoscopy  2002    does not want another!  .  Esophagogastroduodenoscopy  2012    WNL, s/p esoph dilation (Dr. Candace Cruise, [facility])  . US echocardiography  11/2010    EF 55-60%, mild LAE, mild diastolic dysfunction, normal valves    Review of Systems Per HPI    Objective:   Physical Exam  Nursing note and vitals reviewed. Constitutional: She appears well-developed and well-nourished. No distress.  HENT:  Head: Normocephalic and atraumatic.  Mouth/Throat: Oropharynx is clear and moist. No oropharyngeal exudate.  Eyes: Conjunctivae and EOM are normal. Pupils are equal, round, and reactive to light. Left eye exhibits no discharge. No scleral icterus.  Neck: Normal range of motion. Neck supple. No hepatojugular reflux and no JVD present. Carotid bruit is not present.  Cardiovascular: Normal rate, regular rhythm, normal heart sounds and intact distal pulses.   No murmur heard. Pulmonary/Chest: Effort normal and breath sounds normal. No respiratory distress. She has no wheezes. She has no rales.  Musculoskeletal: She exhibits no edema.  Skin: Skin is warm and dry. No rash noted.  Psychiatric: She has a normal mood and affect.       Assessment & Plan:

## 2012-08-22 NOTE — ED Provider Notes (Signed)
CSN: [id number]     Arrival date & time 08/21/12  2115 History     First MD Initiated Contact with Patient 08/21/12 2259     Chief Complaint  Patient presents with  . Numbness  . Neck Pain  . Back Pain   (Consider location/radiation/quality/duration/timing/severity/associated sxs/prior Treatment) HPI 79 yo female presents to the ER from home with complaint of right sided neck and back pain, perioral and intraoral numbness/tingling, and sharp pain across abd and chest.  Sxs started around 3 pm today with tingling to roof of mouth.  Pt then developed sharp shooting pains in her neck/back, LLQ of abd, and right chest under her breast.  Pains were sharp, severe, but lasting only a few seconds.  Pain has resolved and she now has dull burning sensation to middle of her back.  She reports tingling of roof of mouth extended to her distal 1/3 of her tongue bilaterally and around her mouth.  Tingling now only in very front of tongue and roof of mouth.  No focal weakness, unilateral sxs.  Pt admitted over the weekend for chest pain and HTN.  Pt found to have CHF and had diuresis.  Pt noted to have elevated BP, was told if elevated to take extra hydralazine, but has not yet done so.  Has f/u tomorrow with pcm.  Past Medical History  Diagnosis Date  . History of diverticulitis of colon   . HLD (hyperlipidemia)   . Anxiety   . Osteoporosis   . Depression   . GERD (gastroesophageal reflux disease)   . IBS (irritable bowel syndrome)   . Glucose intolerance (impaired glucose tolerance)   . Allergic rhinitis   . Migraine   . HTN (hypertension)     Dr. Martinique, cards  . SUI (stress urinary incontinence, female)     Dr. Amalia Hailey, urology (some urge as well)  . Diverticulosis     colonsocopy 2002 aborted 2/2 tortuous colon and heavy sigmoid diverticulosis  . Dysphagia     EGD 2012 - wnl, s/p esoph dilation  . CKD (chronic kidney disease) stage 3, GFR 30-59 ml/min   . Diastolic dysfunction     per echo in  November 2012; normal EF  . DDD (degenerative disc disease), lumbar   . 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    Past Surgical History  Procedure Laterality Date  . Tonsillectomy  1942  . Submucous resection  1962  . Tubal ligation  1973  . Breast enhancement surgery  1982  . Total abdominal hysterectomy  1996    for frequent dysmennorhea  . Macroplastique implantation  03/2008    Dr. Rogers Blocker  . Uretherolysis and removal of macroplastique  09/13/08    Dr. Amalia Hailey  . Ct head limited w/o cm  04/2010    nothing acute, ? old infarcts L internal capsule  . Renal doppler  2009?    simple cysts, wnl  . Colonoscopy  2002    does not want another!  . Esophagogastroduodenoscopy  2012    WNL, s/p esoph dilation (Dr. Candace Cruise, [facility])  . US echocardiography  11/2010    EF 55-60%, mild LAE, mild diastolic dysfunction, normal valves   Family History  Problem Relation Age of Onset  . Heart failure Father   . Heart disease Father   . Cancer Father     prostate  . Hypertension Father   . Coronary artery disease  Father   . Leukemia Mother     CLL prior   History  Substance Use Topics  . Smoking status: Former Smoker -- 0.50 packs/day for 54 years    Types: Cigarettes    Start date: 01/18/1954    Quit date: 01/19/2008  . Smokeless tobacco: Never Used  . Alcohol Use: Yes     Comment: 1 1/2-2 glasses white wine/day   OB History   Grav Para Term Preterm Abortions TAB SAB Ect Mult Living                 Review of Systems  All other systems reviewed and are negative.    Allergies  Doxycycline monohydrate and Ramipril  Home Medications   Current Outpatient Rx  Name  Route  Sig  Dispense  Refill  . ALPRAZolam (XANAX) 0.25 MG tablet   Oral   Take 0.25 mg by mouth 2 (two) times daily as needed for anxiety.         Marland Kitchen aspirin EC 81 MG tablet   Oral   Take 81 mg by mouth daily.         Marland Kitchen b complex  vitamins tablet   Oral   Take 2 tablets by mouth daily.         . cholecalciferol (VITAMIN D) 1000 UNITS tablet   Oral   Take 1,000 Units by mouth daily.           . Coenzyme Q10 (COQ10) 50 MG CAPS   Oral   Take 1 capsule by mouth daily.           Marland Kitchen doxazosin (CARDURA) 2 MG tablet   Oral   Take 2 mg by mouth at bedtime.         . furosemide (LASIX) 20 MG tablet   Oral   Take 10 mg by mouth daily as needed for fluid.         Marland Kitchen gabapentin (NEURONTIN) 100 MG capsule   Oral   Take 100 mg by mouth 3 (three) times daily.         . Glycerin-Hypromellose-PEG 400 (VISINE PURE TEARS OP)   Both Eyes   Place 2 drops into both eyes daily.         . hydrALAZINE (APRESOLINE) 25 MG tablet   Oral   Take 12.5 mg by mouth 2 (two) times daily.         Marland Kitchen HYDROcodone-acetaminophen (NORCO/VICODIN) 5-325 MG per tablet   Oral   Take 1 tablet by mouth 2 (two) times daily as needed for pain.         . nebivolol (BYSTOLIC) 5 MG tablet   Oral   Take 5 mg by mouth 2 (two) times daily.          . NON FORMULARY   Oral   Take 1 tablet by mouth 4 (four) times daily. Sea calcium         . omeprazole (PRILOSEC) 20 MG capsule   Oral   Take 20 mg by mouth daily as needed. For reflux         . zolpidem (AMBIEN) 5 MG tablet   Oral   Take 5 mg by mouth at bedtime as needed for sleep.          BP 165/72  Pulse 47  Temp(Src) 98 F (36.7 C) (Oral)  Resp 14  SpO2 100% Physical Exam  Nursing note and vitals reviewed. Constitutional: She is oriented to person, place, and  time. She appears well-developed and well-nourished.  HENT:  Head: Normocephalic and atraumatic.  Right Ear: External ear normal.  Left Ear: External ear normal.  Nose: Nose normal.  Mouth/Throat: Oropharynx is clear and moist.  Eyes: Conjunctivae and EOM are normal. Pupils are equal, round, and reactive to light.  Neck: Normal range of motion. Neck supple. No JVD present. No tracheal deviation  present. No thyromegaly present.  Cardiovascular: Normal rate, regular rhythm, normal heart sounds and intact distal pulses.  Exam reveals no gallop and no friction rub.   No murmur heard. Pulmonary/Chest: Effort normal and breath sounds normal. No stridor. No respiratory distress. She has no wheezes. She has no rales. She exhibits no tenderness.  Abdominal: Soft. Bowel sounds are normal. She exhibits no distension and no mass. There is no tenderness. There is no rebound and no guarding.  Musculoskeletal: Normal range of motion. She exhibits no edema and no tenderness.  Lymphadenopathy:    She has no cervical adenopathy.  Neurological: She is alert and oriented to person, place, and time. She has normal reflexes. No cranial nerve deficit. She exhibits normal muscle tone. Coordination normal.  Skin: Skin is warm and dry. No rash noted. No erythema. No pallor.  Psychiatric: She has a normal mood and affect. Her behavior is normal. Judgment and thought content normal.    ED Course   Procedures (including critical care time)  Labs Reviewed  CBC - Abnormal; Notable for the following:    RBC 3.77 (*)    Hemoglobin 11.6 (*)    HCT 34.9 (*)    All other components within normal limits  BASIC METABOLIC PANEL - Abnormal; Notable for the following:    Sodium 134 (*)    BUN 42 (*)    Creatinine, Ser 2.07 (*)    GFR calc non Af Amer 22 (*)    GFR calc Af Amer 25 (*)    All other components within normal limits  POCT I-STAT TROPONIN I    Date: 08/22/2012  Rate: 53  Rhythm: sinus bradycardia  QRS Axis: normal  Intervals: normal  ST/T Wave abnormalities: normal  Conduction Disutrbances:none  Narrative Interpretation:   Old EKG Reviewed: unchanged   Dg Chest 2 View  08/21/2012   *RADIOLOGY REPORT*  Clinical Data: Chest pressure and pain.  CHEST - 2 VIEW  Comparison: PA and lateral chest 09/04/2011 and 08/18/2012.  Findings: Lungs are clear.  Heart size is normal.  No pneumothorax or pleural  effusion.  Breast implants are noted.  IMPRESSION: No acute disease.   Original Report Authenticated By: Orlean Patten, M.D.   Ct Head Wo Contrast  08/22/2012   *RADIOLOGY REPORT*  Clinical Data: Tongue numbness  CT HEAD WITHOUT CONTRAST  Technique:  Contiguous axial images were obtained from the base of the skull through the vertex without contrast.  Comparison: Prior CT from 03/23/2012  Findings: There is no acute intracranial hemorrhage or infarct.  No midline shift or mass lesion.  A few scattered foci of hypodensity seen within the periventricular deep white matter are unchanged, likely representing chronic small vessel ischemic disease.  No extra-axial fluid collection.  Calvarium is intact.  Orbits are normal.  Visualized paranasal sinuses and mastoid air cells are clear.  IMPRESSION: Stable appearance of the brain with no acute intracranial process identified.   Original Report Authenticated By: Jeannine Boga, M.D.   1. Paresthesia of tongue   2. Neck pain   3. Back pain   4. Dehydration   5.  Chronic kidney disease (CKD), stage 3 (moderate)     MDM  79 yo female with paresthesias, short fleeting pains.  Exam and w/u here unremarkable.  Sxs do not seem c/w stroke syndrome.  Bp improved, pt feeling better.  To f/u with pcm tomorrow.  Kalman Drape, MD 08/22/12 [phone]

## 2012-08-22 NOTE — Patient Instructions (Addendum)
Increase gabapentin to 1 in am, 1 at afternoon, and 2 at night to help with nerve pain. Blood pressure is looking better today. Make sure you are staying well hydrated. Return in 2-4 weeks for follow up labs and visit. Call us with questions. Good to see you today.

## 2012-08-23 DIAGNOSIS — M25511 Pain in right shoulder: Secondary | ICD-10-CM | POA: Insufficient documentation

## 2012-08-23 DIAGNOSIS — M25512 Pain in left shoulder: Secondary | ICD-10-CM | POA: Insufficient documentation

## 2012-08-23 NOTE — Assessment & Plan Note (Signed)
Exam stable today.  Trial of increased gabapentin to 100mg  in am and noon, and 200mg  QHS for description of neuropathic pain, although doesn't endorse peripheral neuropathy

## 2012-08-23 NOTE — Assessment & Plan Note (Signed)
Again reviewed dx - foradil was too expensive in past per prior note. Will reassess when pt feeling better.

## 2012-08-23 NOTE — Assessment & Plan Note (Signed)
Acute Cr bump, anticipate due to diuresis. Currently no evidence of fluid overload - so I encouraged liberal fluid intake. Consider recheck Cr next visit. Lab Results  Component Value Date   CREATININE 2.07* 08/21/2012

## 2012-08-23 NOTE — Assessment & Plan Note (Signed)
Recent hospitalization for dyspnea - eval unrevealing, thought due to hypertensive urgency. Today blood pressure better controlled. Pt has added prn hydralazine 12.5mg  prn elevated blood pressures - continue this. Continue bystolic 5mg  bid and hydralazine 12.5mg  bid regularly.

## 2012-08-25 ENCOUNTER — Telehealth: Payer: Self-pay

## 2012-08-25 NOTE — Telephone Encounter (Signed)
With the inc in weight, would take 10mg  of lasix a day if weight is <128 lbs.  Route to PCP in meantime for f/u on Monday.

## 2012-08-25 NOTE — Telephone Encounter (Signed)
Pt notified as instructed; if pts weight is > 128 lbs (verified with Dr Damita Dunnings) to take lasix 10 mg daily. Pt voiced understanding and does have lasix at home. Pt will call back on 08/28/12 with update; it pt condition changes or worsens prior to Monday pt will go to ED for eval.

## 2012-08-25 NOTE — Telephone Encounter (Signed)
Pt recently seen at hospital for CHF; pt said she was told at hospital if weight gain above 3 lbs to call doctor; pt weighing herself at home pt said 08/21/12 wt 126, Tues  And Wed weight 127 and Thur wt 128 today weight 129; pt does not feel more swelling than normally has. Pt not having CP at times has SOB and feels like tire around belly (swelling); pt said has had for 3 weeks or so.  Pt is not taking furosemide; stated no one told her to take furosemide and pt isnot taking a diuretic now. Please advise.[facility].

## 2012-08-27 NOTE — Telephone Encounter (Signed)
Agree.  May take lasix 10mg  qd prn weight >128lbs.  Need to be careful with kidney function in h/o CRI. Can we call on Monday with an update of weight over weekend?

## 2012-08-28 ENCOUNTER — Encounter: Payer: Self-pay | Admitting: Family Medicine

## 2012-08-28 ENCOUNTER — Ambulatory Visit (INDEPENDENT_AMBULATORY_CARE_PROVIDER_SITE_OTHER): Payer: Medicare Other | Admitting: Family Medicine

## 2012-08-28 VITALS — BP 148/84 | HR 54 | Temp 97.7°F | Wt 134.8 lb

## 2012-08-28 DIAGNOSIS — R0609 Other forms of dyspnea: Secondary | ICD-10-CM

## 2012-08-28 DIAGNOSIS — I10 Essential (primary) hypertension: Secondary | ICD-10-CM | POA: Diagnosis not present

## 2012-08-28 DIAGNOSIS — R0600 Dyspnea, unspecified: Secondary | ICD-10-CM | POA: Insufficient documentation

## 2012-08-28 DIAGNOSIS — R209 Unspecified disturbances of skin sensation: Secondary | ICD-10-CM | POA: Diagnosis not present

## 2012-08-28 DIAGNOSIS — R202 Paresthesia of skin: Secondary | ICD-10-CM

## 2012-08-28 NOTE — Telephone Encounter (Signed)
Will see today.  

## 2012-08-28 NOTE — Telephone Encounter (Signed)
Spoke with patient. She said she is now up to 132 lbs and that is with taking lasix. She is SOB with exertion and talking. No chest pain. Appt made for this afternoon.

## 2012-08-28 NOTE — Progress Notes (Signed)
Subjective:    Patient ID: Nicole English, female    DOB: Jun 14, 1932, 78 y.o.   MRN: [id number]  HPI CC: dyspnea, weight gain  Today upset because she's dealing with check fraud - has had to open new checking account.  Recent hospital admission with presumed CHF exacerbation, s/p IV diuresis which led to slight acute on chronic renal insufficiency with latest Cr: Lab Results  Component Value Date   CREATININE 2.07* 08/21/2012    Felt well over the weekend.  However, today episode of dizziness and feels bloated.  Also feeling right epigastric discomfort described as intermittent ache. Over last week, noted increased weight daily to max 134 lbs today.  Also noticing increasing dyspnea on exertion - worse today.  Noticing some wheezing today as well. Reviewed weights, has ranged from 123-134 since 06/2011. Wt Readings from Last 3 Encounters:  08/28/12 134 lb 12 oz (61.122 kg)  08/22/12 128 lb 4 oz (58.174 kg)  08/19/12 126 lb 9.6 oz (57.425 kg)   COPD - not on daily controller or rescue medication. Noted some perioral paresthesias along with her prior shoulder neuropathies.  Compliant with antihypertensives including bystolic 5mg  bid and hydralazine 12.5mg  bid.  Takes extra hydralazine [id number] mg prn systolic [id number].  Has been taking lasix 10mg  daily for the last 4 days.  Takes lasix q am.  Noted increased urine output, however weight continues to increase.    Has had GERD in past, treats with omeprazole 20mg  prn.  Currently on omeprazole 20mg  2 wk regimen in hopes of improving epigastric discomfort.  Past Medical History  Diagnosis Date  . History of diverticulitis of colon   . HLD (hyperlipidemia)   . Anxiety   . Osteoporosis   . Depression   . GERD (gastroesophageal reflux disease)   . IBS (irritable bowel syndrome)   . Glucose intolerance (impaired glucose tolerance)   . Allergic rhinitis   . Migraine   . HTN (hypertension)     Dr. Martinique, cards  . SUI (stress urinary  incontinence, female)     Dr. Amalia Hailey, urology (some urge as well)  . Diverticulosis     colonsocopy 2002 aborted 2/2 tortuous colon and heavy sigmoid diverticulosis  . Dysphagia     EGD 2012 - wnl, s/p esoph dilation  . CKD (chronic kidney disease) stage 3, GFR 30-59 ml/min   . Diastolic dysfunction     per echo in November 2012; normal EF  . DDD (degenerative disc disease), lumbar   . 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   Marland Kitchen History of CVA (cerebrovascular accident) 2012    old per prior head CT    Review of Systems Per HPI    Objective:   Physical Exam  Nursing note and vitals reviewed. Constitutional: She appears well-developed and well-nourished. No distress.  Pleasant, calm, speaks in complete sentences, able to get off exam table without assistance.  HENT:  Head: Normocephalic and atraumatic.  Mouth/Throat: Oropharynx is clear and moist. No oropharyngeal exudate.  Eyes: Conjunctivae and EOM are normal. Pupils are equal, round, and reactive to light. No scleral icterus.  Cardiovascular: Normal rate, regular rhythm, normal heart sounds and intact distal pulses.   No murmur heard. Pulmonary/Chest: Effort normal and breath sounds normal. No respiratory distress. She has no wheezes. She has no rales.  Musculoskeletal: She exhibits no edema.  Lymphadenopathy:    She has no cervical adenopathy.  Skin:  Skin is warm and dry. No rash noted.  Psychiatric: She has a normal mood and affect.       Assessment & Plan:

## 2012-08-28 NOTE — Patient Instructions (Signed)
Check blood pressure when you're feeling ill. Good to see you today, call us with questions. Blood work today - we will call you with results and plan on lasix. No changes to your medications today.

## 2012-08-28 NOTE — Assessment & Plan Note (Addendum)
With weight gain recently, however no other clinical evidence of CHF exacerbation or fluid overload. Blood pressure overall stable. I will check Cr to ensure kidneys tolerating lasix , if stable, consider increased dose to 20mg  daily for 5-7 days. I will also check TSH and B12 to eval recently worsening paresthesias. Pt agrees with plan, will await phone call for lasix instructions.

## 2012-08-29 LAB — BASIC METABOLIC PANEL
BUN: 35 mg/dL — ABNORMAL HIGH (ref 6–23)
CO2: 22 mEq/L (ref 19–32)
GFR: 29.45 mL/min — ABNORMAL LOW (ref >60.00)
Glucose, Bld: 92 mg/dL (ref 70–99)
Potassium: 5.3 mEq/L — ABNORMAL HIGH (ref 3.5–5.1)

## 2012-09-04 DIAGNOSIS — M5380 Other specified dorsopathies, site unspecified: Secondary | ICD-10-CM | POA: Diagnosis not present

## 2012-09-04 DIAGNOSIS — M9981 Other biomechanical lesions of cervical region: Secondary | ICD-10-CM | POA: Diagnosis not present

## 2012-09-04 DIAGNOSIS — M542 Cervicalgia: Secondary | ICD-10-CM | POA: Diagnosis not present

## 2012-09-04 DIAGNOSIS — M999 Biomechanical lesion, unspecified: Secondary | ICD-10-CM | POA: Diagnosis not present

## 2012-09-04 DIAGNOSIS — M546 Pain in thoracic spine: Secondary | ICD-10-CM | POA: Diagnosis not present

## 2012-09-04 DIAGNOSIS — M533 Sacrococcygeal disorders, not elsewhere classified: Secondary | ICD-10-CM | POA: Diagnosis not present

## 2012-09-05 ENCOUNTER — Other Ambulatory Visit: Payer: Self-pay | Admitting: Family Medicine

## 2012-09-06 ENCOUNTER — Other Ambulatory Visit: Payer: Self-pay | Admitting: Family Medicine

## 2012-09-06 NOTE — Telephone Encounter (Signed)
Rx called in as directed.   

## 2012-09-06 NOTE — Telephone Encounter (Signed)
plz phone in. 

## 2012-09-08 ENCOUNTER — Encounter: Payer: Self-pay | Admitting: Internal Medicine

## 2012-09-08 ENCOUNTER — Ambulatory Visit (INDEPENDENT_AMBULATORY_CARE_PROVIDER_SITE_OTHER): Payer: Medicare Other | Admitting: Internal Medicine

## 2012-09-08 ENCOUNTER — Telehealth: Payer: Self-pay

## 2012-09-08 VITALS — BP 160/80 | HR 62 | Temp 97.7°F | Wt 131.0 lb

## 2012-09-08 DIAGNOSIS — I5033 Acute on chronic diastolic (congestive) heart failure: Secondary | ICD-10-CM

## 2012-09-08 NOTE — Progress Notes (Signed)
Subjective:    Patient ID: Nicole English, female    DOB: 08/18/1932, 79 y.o.   MRN: [id number]  HPI Increased furosemide for 5 days after the last visit That seemed to help Then went back to 10mg  daily  Now with more shortness of breath Hard to "get anything accomplished" Feels bloated in abdomen and in face Didn't notice this with the higher dose of furosemide  "My feet drag..." Slight chest pressure--while on her way over here No palpitations No dizziness or syncope  Current Outpatient Prescriptions on File Prior to Visit  Medication Sig Dispense Refill  . ALPRAZolam (XANAX) 0.25 MG tablet Take 0.25 mg by mouth 2 (two) times daily as needed for anxiety.      Marland Kitchen b complex vitamins tablet Take 2 tablets by mouth daily.      . cholecalciferol (VITAMIN D) 1000 UNITS tablet Take 1,000 Units by mouth daily.        . Coenzyme Q10 (COQ10) 50 MG CAPS Take 1 capsule by mouth daily.        Marland Kitchen doxazosin (CARDURA) 2 MG tablet Take 2 mg by mouth at bedtime.      . furosemide (LASIX) 20 MG tablet Take 10 mg by mouth daily as needed for fluid.      Marland Kitchen gabapentin (NEURONTIN) 100 MG capsule Take 100 mg by mouth 3 (three) times daily.      . Glycerin-Hypromellose-PEG 400 (VISINE PURE TEARS OP) Place 2 drops into both eyes daily.      . hydrALAZINE (APRESOLINE) 25 MG tablet Take 12.5 mg by mouth 2 (two) times daily.      Marland Kitchen HYDROcodone-acetaminophen (NORCO/VICODIN) 5-325 MG per tablet Take 1 tablet by mouth 2 (two) times daily as needed for pain.      . nebivolol (BYSTOLIC) 5 MG tablet Take 5 mg by mouth 2 (two) times daily.       . NON FORMULARY Take 1 tablet by mouth 4 (four) times daily. Sea calcium      . omeprazole (PRILOSEC) 20 MG capsule Take 20 mg by mouth daily as needed. For reflux      . zolpidem (AMBIEN) 5 MG tablet TAKE ONE TABLET BY MOUTH ONCE DAILY AT BEDTIME  30 tablet  1   No current facility-administered medications on file prior to visit.    Allergies  Allergen Reactions  .  Doxycycline Monohydrate Nausea And Vomiting  . Ramipril     REACTION: worsening renal function    Past Medical History  Diagnosis Date  . History of diverticulitis of colon   . HLD (hyperlipidemia)   . Anxiety   . Osteoporosis   . Depression   . GERD (gastroesophageal reflux disease)   . IBS (irritable bowel syndrome)   . Glucose intolerance (impaired glucose tolerance)   . Allergic rhinitis   . Migraine   . HTN (hypertension)     Dr. Martinique, cards  . SUI (stress urinary incontinence, female)     Dr. Amalia Hailey, urology (some urge as well)  . Diverticulosis     colonsocopy 2002 aborted 2/2 tortuous colon and heavy sigmoid diverticulosis  . Dysphagia     EGD 2012 - wnl, s/p esoph dilation  . CKD (chronic kidney disease) stage 3, GFR 30-59 ml/min   . Diastolic dysfunction     per echo in November 2012; normal EF  . DDD (degenerative disc disease), lumbar   . 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   Marland Kitchen History of CVA (cerebrovascular accident) 2012    old per prior head CT    Past Surgical History  Procedure Laterality Date  . Tonsillectomy  1942  . Submucous resection  1962  . Tubal ligation  1973  . Breast enhancement surgery  1982  . Total abdominal hysterectomy  1996    for frequent dysmennorhea  . Macroplastique implantation  03/2008    Dr. Rogers Blocker  . Uretherolysis and removal of macroplastique  09/13/08    Dr. Amalia Hailey  . Ct head limited w/o cm  04/2010    nothing acute, ? old infarcts L internal capsule  . Renal doppler  2009?    simple cysts, wnl  . Colonoscopy  2002    does not want another!  . Esophagogastroduodenoscopy  2012    WNL, s/p esoph dilation (Dr. Candace Cruise, [facility])  . US echocardiography  11/2010    EF 55-60%, mild LAE, mild diastolic dysfunction, normal valves    Family History  Problem Relation Age of Onset  . Heart failure Father   . Heart disease Father   . Cancer Father      prostate  . Hypertension Father   . Coronary artery disease Father   . Leukemia Mother     CLL prior    History   Social History  . Marital Status: Widowed    Spouse Name: N/A    Number of Children: N/A  . Years of Education: N/A   Occupational History  . Former Personal assistant    Social History Main Topics  . Smoking status: Former Smoker -- 0.50 packs/day for 54 years    Types: Cigarettes    Start date: 01/18/1954    Quit date: 01/19/2008  . Smokeless tobacco: Never Used  . Alcohol Use: Yes     Comment: 1 1/2-2 glasses white wine/day  . Drug Use: No  . Sexual Activity: Not Currently   Other Topics Concern  . Not on file   Social History Narrative  . No narrative on file   Review of Systems Appetite is okay Hard to get energy to cook though Has her granddaughter staying with her for much of the summer    Objective:   Physical Exam  Constitutional: She appears well-developed and well-nourished. No distress.  Cardiovascular: Normal rate, regular rhythm and normal heart sounds.  Exam reveals no gallop.   No murmur heard. Pulmonary/Chest: Effort normal and breath sounds normal. No respiratory distress. She has no wheezes. She has no rales.  Abdominal: Soft. There is no tenderness.  No shifting dullness or apparent ascites  Musculoskeletal: She exhibits no edema and no tenderness.          Assessment & Plan:

## 2012-09-08 NOTE — Patient Instructions (Signed)
Please increase the furosemide to 20mg  every day Weigh yourself daily and call next week if you haven't gone down by a few pounds or your breathing doesn't feel better.

## 2012-09-08 NOTE — Telephone Encounter (Signed)
Will evaluate at the OV 

## 2012-09-08 NOTE — Assessment & Plan Note (Addendum)
No edema but weight still up some ?pulmonary HTN?  Had symptomatic relief on the higher furosemide dose Now with Class 3 DOE Will go back to 20mg  daily Schedule follow up in about 2 weeks to reeval and check renal function

## 2012-09-08 NOTE — Telephone Encounter (Signed)
Pt called with SOB when walking or talking; pt taking Lasix 10 mg daily but pt said it does not help SOB. Pt said her abdomen is swollen as if she is ready to deliver. I asked pt when the SOB and swelling began; pt said she did not know, she was recently in hospital and no one had called to ck on her since she was seen 08/28/12 by Dr Danise [name].Spoke with Dr Danise [name] and he has no available appts but said if any available appts schedule pt to be seen today. Scheduled pt today at 4:30 with Dr Silvio Pate; pt said she does not know him and wants to see Dr Danise [name]; Explained no available appts with Dr Danise [name] but if pt wants to see someone in our office today pt can see Dr Silvio Pate at 4:30 pm. Also advised pt if condition changes or worsens prior to appt  to go to [facility] or ED.Pt said OK.

## 2012-09-12 ENCOUNTER — Ambulatory Visit (INDEPENDENT_AMBULATORY_CARE_PROVIDER_SITE_OTHER): Payer: Medicare Other | Admitting: Physician Assistant

## 2012-09-12 ENCOUNTER — Encounter: Payer: Self-pay | Admitting: Physician Assistant

## 2012-09-12 VITALS — BP 114/65 | HR 51 | Ht 62.5 in | Wt 130.0 lb

## 2012-09-12 DIAGNOSIS — R079 Chest pain, unspecified: Secondary | ICD-10-CM | POA: Diagnosis not present

## 2012-09-12 DIAGNOSIS — I10 Essential (primary) hypertension: Secondary | ICD-10-CM

## 2012-09-12 DIAGNOSIS — N189 Chronic kidney disease, unspecified: Secondary | ICD-10-CM | POA: Diagnosis not present

## 2012-09-12 DIAGNOSIS — I5032 Chronic diastolic (congestive) heart failure: Secondary | ICD-10-CM

## 2012-09-12 LAB — BASIC METABOLIC PANEL
BUN: 40 mg/dL — ABNORMAL HIGH (ref 6–23)
CO2: 24 mEq/L (ref 19–32)
Chloride: 103 mEq/L (ref 96–112)
GFR: 25.14 mL/min — ABNORMAL LOW (ref >60.00)
Glucose, Bld: 92 mg/dL (ref 70–99)
Potassium: 5 mEq/L (ref 3.5–5.1)

## 2012-09-12 NOTE — Progress Notes (Signed)
[place]. [address] [place], [place]  [address] Phone: [phone] Fax:  [phone]  Date:  09/12/2012   ID:  Nicole English, DOB 1932-08-13, MRN [id number]  PCP:  Ria Bush, MD  Cardiologist:  Dr. Peter Martinique      History of Present Illness: Nicole English is a 79 y.o. female who returns for followup after a recent admission to the hospital.    She has a hx of HTN, diastolic dysfunction, HLD, COPD, GERD, anxiety, depression, diverticulosis and CKD. She has been intolerant to ACE/ARB due to her renal function and HTN. She has been intolerant of amlodipine because of edema.  HCTZ has also affected her renal indices. She was also intolerant to higher doses of hydralazine with complaints of increased swelling and headache.  Myoview at [facility] 04/2010: No ischemia, EF 90%.   Echo 3/14: EF 123456, diastolic dysfunction, MAC. Carotid US 4/14: Bilateral ICA 0-39% (followup for/16). Event monitor 3/14: Normal sinus rhythm. Last seen by Dr. Martinique 4/14.  She was admitted 8/1-8/2 after presenting to the hospital with complaints of chest pain with associated fatigue and dyspnea. Blood pressure was elevated at 190/100.  She ruled out for myocardial infarction by enzymes. BNP was elevated and she was given a dose of IV Lasix. She was seen by Dr. Acie Fredrickson for cardiology.  Symptoms improved with improved control of her blood pressure. It was felt that her presenting symptoms were likely related to uncontrolled hypertension. There was a suggestion of COPD changes on her chest x-ray and she was asked to followup with her primary care physician.  Since discharge, the patient has noted increased dyspnea and chest pressure. She was seen by her PCP. She has had symptom improvement with up titration of her Lasix from 10-20 mg daily. She denies any further chest discomfort. Her breathing is much improved. She probably describes NYHA class II-IIb symptoms. She denies  orthopnea, PND or edema. She denies syncope.  Labs (8/14):  K 5.3, creatinine 1.8, BNP 2700, Hgb 11.6, TSH 2.62  Wt Readings from Last 3 Encounters:  09/12/12 130 lb (58.968 kg)  09/08/12 131 lb (59.421 kg)  08/28/12 134 lb 12 oz (61.122 kg)     Past Medical History  Diagnosis Date  . History of diverticulitis of colon   . HLD (hyperlipidemia)   . Anxiety   . Osteoporosis   . Depression   . GERD (gastroesophageal reflux disease)   . IBS (irritable bowel syndrome)   . Glucose intolerance (impaired glucose tolerance)   . Allergic rhinitis   . Migraine   . HTN (hypertension)     Dr. Martinique, cards  . SUI (stress urinary incontinence, female)     Dr. Amalia Hailey, urology (some urge as well)  . Diverticulosis     colonsocopy 2002 aborted 2/2 tortuous colon and heavy sigmoid diverticulosis  . Dysphagia     EGD 2012 - wnl, s/p esoph dilation  . CKD (chronic kidney disease) stage 3, GFR 30-59 ml/min   . Diastolic dysfunction     per echo in November 2012; normal EF  . DDD (degenerative disc disease), lumbar   . 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   Marland Kitchen History of CVA (cerebrovascular accident) 2012    old per prior head CT    Current Outpatient Prescriptions  Medication Sig Dispense Refill  . ALPRAZolam (XANAX) 0.25 MG tablet Take 0.25  mg by mouth 2 (two) times daily as needed for anxiety.      Marland Kitchen aspirin 81 MG tablet Take 81 mg by mouth daily.      Marland Kitchen b complex vitamins tablet Take 2 tablets by mouth daily.      . cholecalciferol (VITAMIN D) 1000 UNITS tablet Take 1,000 Units by mouth daily.        . Coenzyme Q10 (COQ10) 50 MG CAPS Take 1 capsule by mouth daily.        Marland Kitchen doxazosin (CARDURA) 2 MG tablet Take 2 mg by mouth at bedtime.      . furosemide (LASIX) 20 MG tablet Take 20 mg by mouth daily.       Marland Kitchen gabapentin (NEURONTIN) 100 MG capsule Take 100 mg by mouth 3 (three) times daily.      .  Glycerin-Hypromellose-PEG 400 (VISINE PURE TEARS OP) Place 2 drops into both eyes daily.      . hydrALAZINE (APRESOLINE) 25 MG tablet Take 12.5 mg by mouth 2 (two) times daily.      Marland Kitchen HYDROcodone-acetaminophen (NORCO/VICODIN) 5-325 MG per tablet Take 1 tablet by mouth 2 (two) times daily as needed for pain.      . nebivolol (BYSTOLIC) 5 MG tablet Take 5 mg by mouth 2 (two) times daily.       . NON FORMULARY Take 1 tablet by mouth daily at 6 PM. Sea calcium      . omeprazole (PRILOSEC) 20 MG capsule Take 20 mg by mouth daily as needed. For reflux      . zolpidem (AMBIEN) 5 MG tablet TAKE ONE TABLET BY MOUTH ONCE DAILY AT BEDTIME  30 tablet  1   No current facility-administered medications for this visit.    Allergies:    Allergies  Allergen Reactions  . Doxycycline Monohydrate Nausea And Vomiting  . Ramipril     REACTION: worsening renal function    Social History:  The patient  reports that she quit smoking about 4 years ago. Her smoking use included Cigarettes. She started smoking about 58 years ago. She has a 27 pack-year smoking history. She has never used smokeless tobacco. She reports that  drinks alcohol. She reports that she does not use illicit drugs.   ROS:  Please see the history of present illness.   All other systems reviewed and negative.   PHYSICAL EXAM: VS:  BP 114/65  Pulse 51  Ht 5' 2.5" (1.588 m)  Wt 130 lb (58.968 kg)  BMI 23.38 kg/m2 Well nourished, well developed, in no acute distress HEENT: normal Neck: no JVD Cardiac:  normal S1, S2; RRR; no murmur Lungs:  clear to auscultation bilaterally, no wheezing, rhonchi or rales Abd: soft, nontender, no hepatomegaly Ext: no edema Skin: warm and dry Neuro:  CNs 2-12 intact, no focal abnormalities noted  EKG:  Sinus bradycardia, HR 51, normal axis     ASSESSMENT AND PLAN:  1. Chronic Diastolic CHF: Volume appears to be stable on current dose of Lasix. I suspect her chest discomfort is related to volume overload.  She has significant improvement with diuresis. Check a basic metabolic panel today.  I had a long discussion with the patient regarding weighing herself daily and when to notify us for weight gain or worsening symptoms. 2. Chest Pain:  She had a low risk Myoview 2 years ago. Her symptoms resolve with improved volume status. No further ischemic evaluation at this time. Chest symptoms seem to be related to CHF. 3. Hypertension:  Controlled. Continue current therapy. 4. CKD: Check follow up basic metabolic panel today. 5. Disposition: Follow up with Dr. Martinique in October as planned.  Signed, Richardson Dopp, PA-C  09/12/2012 12:02 PM

## 2012-09-12 NOTE — Patient Instructions (Addendum)
NO CHANGES WERE MADE TODAY  LAB TODAY BMET  KEEP YOUR APPT IN October WITH DR. Martinique

## 2012-09-13 ENCOUNTER — Telehealth: Payer: Self-pay | Admitting: *Deleted

## 2012-09-13 ENCOUNTER — Telehealth: Payer: Self-pay | Admitting: Cardiology

## 2012-09-13 DIAGNOSIS — M533 Sacrococcygeal disorders, not elsewhere classified: Secondary | ICD-10-CM | POA: Diagnosis not present

## 2012-09-13 DIAGNOSIS — M999 Biomechanical lesion, unspecified: Secondary | ICD-10-CM | POA: Diagnosis not present

## 2012-09-13 DIAGNOSIS — M9981 Other biomechanical lesions of cervical region: Secondary | ICD-10-CM | POA: Diagnosis not present

## 2012-09-13 DIAGNOSIS — M5380 Other specified dorsopathies, site unspecified: Secondary | ICD-10-CM | POA: Diagnosis not present

## 2012-09-13 DIAGNOSIS — M542 Cervicalgia: Secondary | ICD-10-CM | POA: Diagnosis not present

## 2012-09-13 DIAGNOSIS — M546 Pain in thoracic spine: Secondary | ICD-10-CM | POA: Diagnosis not present

## 2012-09-13 NOTE — Telephone Encounter (Signed)
Message copied by Michae Kava on Wed Sep 13, 2012  8:38 AM ------      Message from: [place], [place] T      Created: Tue Sep 12, 2012  4:47 PM       Creatinine stable      Continue with current treatment plan.      Richardson Dopp, PA-C        09/12/2012 4:47 PM ------

## 2012-09-13 NOTE — Telephone Encounter (Signed)
F/up  Pt returning a call // no VM// pt not sure of the nature of the call

## 2012-09-13 NOTE — Telephone Encounter (Signed)
pt notified about lab results with verbal understanding  

## 2012-09-15 ENCOUNTER — Other Ambulatory Visit: Payer: Self-pay | Admitting: Family Medicine

## 2012-09-15 NOTE — Telephone Encounter (Signed)
Rx called in as directed.   

## 2012-09-15 NOTE — Telephone Encounter (Signed)
Pt called for status of alprazolam refill; spoke with [place] getting refill ready now. Pt notified.

## 2012-09-15 NOTE — Telephone Encounter (Signed)
plz phone in. 

## 2012-09-17 DIAGNOSIS — I10 Essential (primary) hypertension: Secondary | ICD-10-CM | POA: Diagnosis not present

## 2012-09-17 DIAGNOSIS — J449 Chronic obstructive pulmonary disease, unspecified: Secondary | ICD-10-CM | POA: Diagnosis not present

## 2012-09-17 DIAGNOSIS — M25519 Pain in unspecified shoulder: Secondary | ICD-10-CM | POA: Diagnosis not present

## 2012-09-19 NOTE — Telephone Encounter (Signed)
Returned call to patient she stated she recently saw Richardson Dopp PA and she needed clarification.Stated she knows she has kidney cysts and she does not kidney Dr.Stated she was told if she ever has to have a cardiac cath that might be a concern.Stated she wants to know if she needs  a kidney Dr.and if she ever needs a cath what would need to be done about her kidneys.Dr.Jordan out of office today will check with him 09/20/12 and call her back.

## 2012-09-20 DIAGNOSIS — Z961 Presence of intraocular lens: Secondary | ICD-10-CM | POA: Diagnosis not present

## 2012-09-20 DIAGNOSIS — H1045 Other chronic allergic conjunctivitis: Secondary | ICD-10-CM | POA: Diagnosis not present

## 2012-09-20 DIAGNOSIS — H04129 Dry eye syndrome of unspecified lacrimal gland: Secondary | ICD-10-CM | POA: Diagnosis not present

## 2012-09-20 NOTE — Telephone Encounter (Signed)
Returned call to patient no answer.LMTC. 

## 2012-09-21 ENCOUNTER — Encounter: Payer: Self-pay | Admitting: Radiology

## 2012-09-21 NOTE — Telephone Encounter (Signed)
Follow Up  Pt returning call.

## 2012-09-21 NOTE — Telephone Encounter (Signed)
Returned call to patient spoke to [place] he advised does not need to see a kidney Dr. unless kidney functions become worse.Also kidney cysts want cause a problem if you ever need a cath it will be your kidney functions and they would hydrate your kidneys if they needed too.

## 2012-09-22 ENCOUNTER — Ambulatory Visit (INDEPENDENT_AMBULATORY_CARE_PROVIDER_SITE_OTHER): Payer: Medicare Other | Admitting: Family Medicine

## 2012-09-22 ENCOUNTER — Telehealth: Payer: Self-pay

## 2012-09-22 ENCOUNTER — Encounter: Payer: Self-pay | Admitting: Family Medicine

## 2012-09-22 VITALS — BP 122/74 | HR 60 | Temp 98.0°F | Wt 131.8 lb

## 2012-09-22 DIAGNOSIS — G8929 Other chronic pain: Secondary | ICD-10-CM

## 2012-09-22 DIAGNOSIS — I5033 Acute on chronic diastolic (congestive) heart failure: Secondary | ICD-10-CM

## 2012-09-22 DIAGNOSIS — Z5181 Encounter for therapeutic drug level monitoring: Secondary | ICD-10-CM | POA: Diagnosis not present

## 2012-09-22 DIAGNOSIS — M549 Dorsalgia, unspecified: Secondary | ICD-10-CM | POA: Diagnosis not present

## 2012-09-22 DIAGNOSIS — Z79899 Other long term (current) drug therapy: Secondary | ICD-10-CM | POA: Diagnosis not present

## 2012-09-22 NOTE — Assessment & Plan Note (Signed)
Very stable on new regimen of lasix 20mg  daily. Feels well. Will not check Cr as this was checked 1 week ago by cards after she started higher lasix dose. Continue this.  Aware of importance of daily weights

## 2012-09-22 NOTE — Assessment & Plan Note (Signed)
Knows to avoid NSAIDs 

## 2012-09-22 NOTE — Assessment & Plan Note (Addendum)
Nicole English has known severe lumbar DDD.  I anticipate her pain is related to this, however no red flags today. Will add tylenol 500mg  bid to her pain regimen of hydrocodone and gabapentin. May discuss ESI vs other intervention in future. Avoid NSAIDs.

## 2012-09-22 NOTE — Progress Notes (Signed)
  Subjective:    Patient ID: Nicole English, female    DOB: 1932/05/25, 79 y.o.   MRN: [id number]  HPI CC: 2 wk recheck  Seen here 09/08/2012 by Dr. Silvio Pate with concern for acute on chronic CHF exacerbation.  Lasix was increased to 20mg  daily.  Feels stable on this dose.  With diuresis has noted resolution of chest discomfort she prior felt.  bp stable and weight stable. Wt Readings from Last 3 Encounters:  09/22/12 131 lb 12 oz (59.761 kg)  09/12/12 130 lb (58.968 kg)  09/08/12 131 lb (59.421 kg)    Lower extremity pain - known h/o lumbar DDD.  Recently saw chiropractor and had some adjustments of lower extremities.  Currently having lower back, bilateral hip and bilateral leg pain described as constant achey pain.  No burning.  Would like to discuss medicine for this.  Takes hydrocodone 5/325 1/2 tablet 3-4 times daily.  Heating pad and electrical blanket occasionally helps.  Does not take NSAIDs 2/2 CRI Denies numbness/tingling, new bowel/bladder accidents, fevers/chills, shooting pain down legs.  Past Medical History  Diagnosis Date  . History of diverticulitis of colon   . HLD (hyperlipidemia)   . Anxiety   . Osteoporosis   . Depression   . GERD (gastroesophageal reflux disease)   . IBS (irritable bowel syndrome)   . Glucose intolerance (impaired glucose tolerance)   . Allergic rhinitis   . Migraine   . HTN (hypertension)     Dr. Martinique, cards  . SUI (stress urinary incontinence, female)     Dr. Amalia Hailey, urology (some urge as well)  . Diverticulosis     colonsocopy 2002 aborted 2/2 tortuous colon and heavy sigmoid diverticulosis  . Dysphagia     EGD 2012 - wnl, s/p esoph dilation  . CKD (chronic kidney disease) stage 3, GFR 30-59 ml/min   . Diastolic dysfunction     per echo in November 2012; normal EF  . DDD (degenerative disc disease), lumbar   . 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   Marland Kitchen History  of CVA (cerebrovascular accident) 2012    old per prior head CT     Review of Systems Per HPI    Objective:   Physical Exam  Nursing note and vitals reviewed. Constitutional: She appears well-developed and well-nourished. No distress.  HENT:  Mouth/Throat: Oropharynx is clear and moist. No oropharyngeal exudate.  Cardiovascular: Normal rate, regular rhythm, normal heart sounds and intact distal pulses.   No murmur heard. Pulmonary/Chest: Effort normal and breath sounds normal. No respiratory distress. She has no wheezes. She has no rales.  Musculoskeletal: She exhibits no edema.  Mild discomfort midline upper lumbar region but no paraspinous mm tenderness Neg SLR bilaterally No pain with int/ext rotation at hip Neg FABER.  Neurological: She has normal strength. No sensory deficit.  5/5 strength BLE Sensation intact  Skin: Skin is warm and dry. No rash noted.  Psychiatric: She has a normal mood and affect.       Assessment & Plan:

## 2012-09-22 NOTE — Telephone Encounter (Signed)
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 Pt wanted Dr Darnell Level to know pt is taking this med.

## 2012-09-22 NOTE — Patient Instructions (Addendum)
For lower back pain - let's add tylenol 500mg  twice a day to your current hydrocodone regimen to see if improvement in back and leg pain. Continue lasix 20mg  daily as this is working well for you. Return to see me in next few months for physical, prior fasting for blood work See Ronny Bacon to sign controlled substance agreement form today.

## 2012-09-24 ENCOUNTER — Other Ambulatory Visit: Payer: Self-pay | Admitting: Family Medicine

## 2012-09-24 NOTE — Telephone Encounter (Signed)
plz phoen in. 

## 2012-09-25 ENCOUNTER — Other Ambulatory Visit: Payer: Self-pay | Admitting: Family Medicine

## 2012-09-25 ENCOUNTER — Telehealth: Payer: Self-pay | Admitting: Cardiology

## 2012-09-25 NOTE — Telephone Encounter (Signed)
Rx called in as directed.   

## 2012-09-25 NOTE — Telephone Encounter (Signed)
Returned call to patient she stated she had a episode this morning tingling sensation rt side of neck going up into head and down into rt thigh and foot.B/P during sensation 151/107.Stated she took a extra hydralazine 12.5 mg.Stated her B/P at present is 142/88.Stated she has a little tingling rt side of face.Stated she wanted to know why her B/P has been up and down ranging 89/70 to 151/107.Will check with Truitt Merle NP 09/26/12 and call her back.

## 2012-09-25 NOTE — Telephone Encounter (Signed)
New Problem  Pt states this morning she had a  BP reading of 89/70 @7 :30am //recently it was 151/107 @4 :30pm. Wants to know if there is anything in particular that she needs to do to get this BP down.

## 2012-09-25 NOTE — Telephone Encounter (Signed)
Noted.  Will add to med list

## 2012-09-26 NOTE — Telephone Encounter (Signed)
Returned call to patient spoke to Nicole Merle NP she advised patient will need a renal doppler if never had one done.After reviewing patient's chart could not find a renal doppler.Patient stated she believes she had a renal doppler when she first started seeing [name].Will check with [name] when he is back in office 10/02/12 and call her back.Patient stated her B/P has been normal today.

## 2012-10-03 NOTE — Telephone Encounter (Signed)
Patient called no answer.Left message on personal voice mail Dr.Jordan did not advise renal dopplers.He advised to stay on same medication and continue to monitor B/P.Advised to cal back if needed.

## 2012-10-12 ENCOUNTER — Other Ambulatory Visit: Payer: Self-pay | Admitting: Family Medicine

## 2012-10-12 DIAGNOSIS — M546 Pain in thoracic spine: Secondary | ICD-10-CM | POA: Diagnosis not present

## 2012-10-12 DIAGNOSIS — M545 Low back pain: Secondary | ICD-10-CM | POA: Diagnosis not present

## 2012-10-12 DIAGNOSIS — M9981 Other biomechanical lesions of cervical region: Secondary | ICD-10-CM | POA: Diagnosis not present

## 2012-10-12 DIAGNOSIS — M533 Sacrococcygeal disorders, not elsewhere classified: Secondary | ICD-10-CM | POA: Diagnosis not present

## 2012-10-12 DIAGNOSIS — M5412 Radiculopathy, cervical region: Secondary | ICD-10-CM | POA: Diagnosis not present

## 2012-10-12 DIAGNOSIS — M999 Biomechanical lesion, unspecified: Secondary | ICD-10-CM | POA: Diagnosis not present

## 2012-10-12 NOTE — Telephone Encounter (Signed)
Ok to refill in Dr. Synthia Innocent absence? Last refilled 09/15/12.

## 2012-10-12 NOTE — Telephone Encounter (Signed)
Called to [facility].

## 2012-10-13 ENCOUNTER — Telehealth: Payer: Self-pay

## 2012-10-13 NOTE — Telephone Encounter (Signed)
Pt left v/m; pt is aware flu shots are available but wants Dr Gutierrez's recommendation of when pt should get her flu shot. Pt said in the past she has been advised to get later in the season.Please advise.

## 2012-10-13 NOTE — Telephone Encounter (Signed)
I would recommend getting flu shot anytime after 10/18/2012.

## 2012-10-13 NOTE — Telephone Encounter (Signed)
Patient notified and will update when she receives it from the pharmacy.

## 2012-10-16 DIAGNOSIS — M533 Sacrococcygeal disorders, not elsewhere classified: Secondary | ICD-10-CM | POA: Diagnosis not present

## 2012-10-16 DIAGNOSIS — M999 Biomechanical lesion, unspecified: Secondary | ICD-10-CM | POA: Diagnosis not present

## 2012-10-16 DIAGNOSIS — M5412 Radiculopathy, cervical region: Secondary | ICD-10-CM | POA: Diagnosis not present

## 2012-10-16 DIAGNOSIS — M9981 Other biomechanical lesions of cervical region: Secondary | ICD-10-CM | POA: Diagnosis not present

## 2012-10-16 DIAGNOSIS — M545 Low back pain: Secondary | ICD-10-CM | POA: Diagnosis not present

## 2012-10-16 DIAGNOSIS — M546 Pain in thoracic spine: Secondary | ICD-10-CM | POA: Diagnosis not present

## 2012-10-17 ENCOUNTER — Other Ambulatory Visit: Payer: Self-pay

## 2012-10-17 MED ORDER — FUROSEMIDE 20 MG PO TABS: 20.0000 mg | ORAL_TABLET | Freq: Every day | ORAL | Status: DC

## 2012-10-23 DIAGNOSIS — Z23 Encounter for immunization: Secondary | ICD-10-CM | POA: Diagnosis not present

## 2012-10-24 ENCOUNTER — Encounter: Payer: Self-pay | Admitting: Family Medicine

## 2012-10-27 ENCOUNTER — Ambulatory Visit (INDEPENDENT_AMBULATORY_CARE_PROVIDER_SITE_OTHER): Payer: Medicare Other | Admitting: Family Medicine

## 2012-10-27 ENCOUNTER — Encounter: Payer: Self-pay | Admitting: Family Medicine

## 2012-10-27 VITALS — BP 142/82 | HR 52 | Temp 98.2°F | Wt 131.5 lb

## 2012-10-27 DIAGNOSIS — R0609 Other forms of dyspnea: Secondary | ICD-10-CM | POA: Diagnosis not present

## 2012-10-27 DIAGNOSIS — I5189 Other ill-defined heart diseases: Secondary | ICD-10-CM

## 2012-10-27 DIAGNOSIS — I519 Heart disease, unspecified: Secondary | ICD-10-CM

## 2012-10-27 DIAGNOSIS — Z780 Asymptomatic menopausal state: Secondary | ICD-10-CM | POA: Diagnosis not present

## 2012-10-27 DIAGNOSIS — R0602 Shortness of breath: Secondary | ICD-10-CM | POA: Diagnosis not present

## 2012-10-27 DIAGNOSIS — M810 Age-related osteoporosis without current pathological fracture: Secondary | ICD-10-CM

## 2012-10-27 LAB — CBC WITH DIFFERENTIAL/PLATELET
Basophils Absolute: 0.1 10*3/uL (ref 0.0–0.1)
Basophils Relative: 1.3 % (ref 0.0–3.0)
Eosinophils Absolute: 0.4 10*3/uL (ref 0.0–0.7)
Hemoglobin: 11.5 g/dL — ABNORMAL LOW (ref 12.0–15.0)
Lymphocytes Relative: 37.6 % (ref 12.0–46.0)
MCHC: 33.2 g/dL (ref 30.0–36.0)
Monocytes Relative: 9 % (ref 3.0–12.0)
Neutrophils Relative %: 44.2 % (ref 43.0–77.0)
RBC: 3.71 Mil/uL — ABNORMAL LOW (ref 3.87–5.11)
RDW: 13.7 % (ref 11.5–14.6)

## 2012-10-27 LAB — RENAL FUNCTION PANEL
Albumin: 4.1 g/dL (ref 3.5–5.2)
BUN: 42 mg/dL — ABNORMAL HIGH (ref 6–23)
Chloride: 108 mEq/L (ref 96–112)
GFR: 23.15 mL/min — ABNORMAL LOW (ref >60.00)
Glucose, Bld: 99 mg/dL (ref 70–99)
Phosphorus: 4.3 mg/dL (ref 2.3–4.6)
Potassium: 4.8 mEq/L (ref 3.5–5.1)
Sodium: 142 mEq/L (ref 135–145)

## 2012-10-27 LAB — BRAIN NATRIURETIC PEPTIDE: Pro B Natriuretic peptide (BNP): 342 pg/mL — ABNORMAL HIGH (ref 0.0–100.0)

## 2012-10-27 NOTE — Assessment & Plan Note (Signed)
Recheck Cr.

## 2012-10-27 NOTE — Progress Notes (Signed)
Subjective:    Patient ID: Nicole English, female    DOB: 12-Sep-1932, 79 y.o.   MRN: [id number]  HPI CC: SOB  H/o diastolic CHF, severe COPD, HTN, CKD stage 3. [name] presents today with concern for increasing dyspnea despite daily lasix 20mg .  Worsening dyspnea with any exertion even short distances or picking up sticks in yard, associated with chest pressure /tightness.  Stays dizzy some.  + HA at base of neck.  No discomfort currently, no discomfort at rest.  Was running late today and attrbutes elevated bp to this.  Denies significant palpitations, nausea.  Denies coughing or wheezing. Continues having pain - takes hydrocodone for this - legs, hips, thoracic back.  Last myoview was >2 years ago, low risk. Latest echo 03/2012 stable, some diastolic dysfunction o/w normal.  Has not had recent evaluation for bone density.   Past Medical History  Diagnosis Date  . History of diverticulitis of colon   . HLD (hyperlipidemia)   . Anxiety   . Osteoporosis   . Depression   . GERD (gastroesophageal reflux disease)   . IBS (irritable bowel syndrome)   . Glucose intolerance (impaired glucose tolerance)   . Allergic rhinitis   . Migraine   . HTN (hypertension)     Dr. Martinique, cards  . SUI (stress urinary incontinence, female)     Dr. Amalia Hailey, urology (some urge as well)  . Diverticulosis     colonsocopy 2002 aborted 2/2 tortuous colon and heavy sigmoid diverticulosis  . Dysphagia     EGD 2012 - wnl, s/p esoph dilation  . CKD (chronic kidney disease) stage 3, GFR 30-59 ml/min   . Diastolic dysfunction     per echo in November 2012; normal EF  . DDD (degenerative disc disease), lumbar   . 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   Marland Kitchen History of CVA (cerebrovascular accident) 2012    old per prior head CT    Past Surgical History  Procedure Laterality Date  . Tonsillectomy  1942  . Submucous resection   1962  . Tubal ligation  1973  . Breast enhancement surgery  1982  . Total abdominal hysterectomy  1996    for frequent dysmennorhea  . Macroplastique implantation  03/2008    Dr. Rogers Blocker  . Uretherolysis and removal of macroplastique  09/13/08    Dr. Amalia Hailey  . Ct head limited w/o cm  04/2010    nothing acute, ? old infarcts L internal capsule  . Renal doppler  2009?    simple cysts, wnl  . Colonoscopy  2002    does not want another!  . Esophagogastroduodenoscopy  2012    WNL, s/p esoph dilation (Dr. Candace Cruise, [facility])  . US echocardiography  11/2010    EF 55-60%, mild LAE, mild diastolic dysfunction, normal valves    Review of Systems Per HPI    Objective:   Physical Exam  Nursing note and vitals reviewed. Constitutional: She appears well-developed and well-nourished. No distress.  HENT:  Mouth/Throat: Oropharynx is clear and moist. No oropharyngeal exudate.  Neck: No JVD present.  Cardiovascular: Normal rate, regular rhythm, normal heart sounds and intact distal pulses.   No murmur heard. Pulmonary/Chest: Effort normal and breath sounds normal. No respiratory distress. She has no wheezes. She has no rales.  Crackles diffusely  Musculoskeletal: She exhibits no edema.  Skin: Skin is warm and dry. No rash noted.  Assessment & Plan:

## 2012-10-27 NOTE — Assessment & Plan Note (Signed)
Recheck dexa as no recent reading. Continue coral calcium and vit D regularly

## 2012-10-27 NOTE — Assessment & Plan Note (Addendum)
Recently worsening, associated with chest tightness - concerning features. EKG today - NSR rate 50s, normal axis, intervals, no acute ST/T changes. Regardless, I would like patient to see Dr. Martinique or cards sooner.  Pt agrees. Check BNP, renal panel, and Troponin today - then decide on increased lasix dose. Continue other meds as up to now. Doubt COPD related as no cough, no wheeze.

## 2012-10-27 NOTE — Assessment & Plan Note (Deleted)
Concern for exacerbation - check BNP, consider increased lasix dose.

## 2012-10-27 NOTE — Assessment & Plan Note (Signed)
Concern for exacerbation - check BNP, consider increased lasix dose. Wt Readings from Last 3 Encounters:  10/27/12 131 lb 8 oz (59.648 kg)  09/22/12 131 lb 12 oz (59.761 kg)  09/12/12 130 lb (58.968 kg)

## 2012-10-27 NOTE — Patient Instructions (Addendum)
We will call you with appointment for bone density scan. Blood work today.  EKG today - looking ok. I'd like you to schedule sooner appointment with Dr. Martinique (next week) - pass by Marion's office for this. We will call you with results of blood work.

## 2012-10-28 LAB — TROPONIN I: Troponin I: 0.01 ng/mL (ref <0.06)

## 2012-10-29 ENCOUNTER — Other Ambulatory Visit: Payer: Self-pay | Admitting: Family Medicine

## 2012-10-30 ENCOUNTER — Telehealth: Payer: Self-pay | Admitting: *Deleted

## 2012-10-30 MED ORDER — HYDROCODONE-ACETAMINOPHEN 5-325 MG PO TABS: ORAL_TABLET | ORAL | Status: DC

## 2012-10-30 NOTE — Telephone Encounter (Signed)
Patient meant to get refill at visit on Friday and came in asking for refill today. Last filled on 09/24/12. Printed and given to Dr. Darnell Level to sign. Given to [place] to give to patient and to get patient signature.

## 2012-10-31 ENCOUNTER — Encounter: Payer: Self-pay | Admitting: Internal Medicine

## 2012-10-31 ENCOUNTER — Encounter: Payer: Self-pay | Admitting: Cardiology

## 2012-11-02 ENCOUNTER — Ambulatory Visit (INDEPENDENT_AMBULATORY_CARE_PROVIDER_SITE_OTHER): Payer: Medicare Other | Admitting: Nurse Practitioner

## 2012-11-02 ENCOUNTER — Encounter: Payer: Self-pay | Admitting: Nurse Practitioner

## 2012-11-02 VITALS — BP 100/64 | HR 60 | Ht 62.0 in | Wt 131.4 lb

## 2012-11-02 DIAGNOSIS — I5032 Chronic diastolic (congestive) heart failure: Secondary | ICD-10-CM

## 2012-11-02 DIAGNOSIS — R0602 Shortness of breath: Secondary | ICD-10-CM

## 2012-11-02 LAB — CBC WITH DIFFERENTIAL/PLATELET
Basophils Absolute: 0.1 10*3/uL (ref 0.0–0.1)
Basophils Relative: 1.4 % (ref 0.0–3.0)
Eosinophils Absolute: 0.5 10*3/uL (ref 0.0–0.7)
Eosinophils Relative: 9.4 % — ABNORMAL HIGH (ref 0.0–5.0)
HCT: 35.8 % — ABNORMAL LOW (ref 36.0–46.0)
Hemoglobin: 12 g/dL (ref 12.0–15.0)
Lymphocytes Relative: 38.4 % (ref 12.0–46.0)
Lymphs Abs: 2.2 10*3/uL (ref 0.7–4.0)
MCHC: 33.5 g/dL (ref 30.0–36.0)
MCV: 92.6 fl (ref 78.0–100.0)
Monocytes Absolute: 0.6 10*3/uL (ref 0.1–1.0)
Monocytes Relative: 9.5 % (ref 3.0–12.0)
Neutro Abs: 2.4 10*3/uL (ref 1.4–7.7)
Neutrophils Relative %: 41.3 % — ABNORMAL LOW (ref 43.0–77.0)
Platelets: 275 10*3/uL (ref 150.0–400.0)
RBC: 3.87 Mil/uL (ref 3.87–5.11)
RDW: 13.7 % (ref 11.5–14.6)
WBC: 5.8 10*3/uL (ref 4.5–10.5)

## 2012-11-02 LAB — BASIC METABOLIC PANEL
BUN: 54 mg/dL — ABNORMAL HIGH (ref 6–23)
CO2: 28 mEq/L (ref 19–32)
Calcium: 9.5 mg/dL (ref 8.4–10.5)
Chloride: 103 mEq/L (ref 96–112)
Creatinine, Ser: 2.3 mg/dL — ABNORMAL HIGH (ref 0.4–1.2)
GFR: 22.09 mL/min — ABNORMAL LOW (ref >60.00)
Glucose, Bld: 105 mg/dL — ABNORMAL HIGH (ref 70–99)
Potassium: 5.7 mEq/L — ABNORMAL HIGH (ref 3.5–5.1)
Sodium: 141 mEq/L (ref 135–145)

## 2012-11-02 LAB — BRAIN NATRIURETIC PEPTIDE: Pro B Natriuretic peptide (BNP): 179 pg/mL — ABNORMAL HIGH (ref 0.0–100.0)

## 2012-11-02 NOTE — Progress Notes (Signed)
[name] Date of Birth: [birth year] Medical Record [id number]  History of Present Illness: Nicole English is seen back today for a work in visit. Seen for Dr. Martinique. She has a history of HTN, diastolic dysfunction, HLD, COPD - with severe airway obstruction on spirometry noted back in January of 2014, GERD, anxiety, depression, diverticulosis and CKD. Intolerant to ACE/ARB due to renal function. Intolerant to Norvasc due to edema. HCTZ has affected her renal indices. Intolerant to higher doses of hydralazine due to swelling and headache. Last Myoview at [facility] 04/2010 with no ischemia and EF of 90%. Echo 03/2012 with EF 60 to [id number], diastolic dysfunction, MAC. Has bilateral carotid disease of 0 to 39% per doppler in 04/2012. Negative event monitor in 03/2012.   Admitted back in August of this year with chest pain, fatigue and dyspnea - felt to be related to marked HTN. COPD noted on CXR - was to see her PCP.   Saw Richardson Dopp, [place] back at the end of August and she was doing ok post hospital.   Comes back today. Here alone. Sent back here by her PCP due to worsening dyspnea - says she just can't do anything without giving out of breath. Does fine at rest. Has associated chest pressure. No cough. Weight is actually stable. No swelling. No PND/orthopnea. Has never seen pulmonary. No inhalers. Frustrated that she can't exercise. Has never seen nephrology as well. Has had her lasix increased to 20 mg BID - with some improvement.   Current Outpatient Prescriptions  Medication Sig Dispense Refill  . acetaminophen (TYLENOL) 500 MG tablet Take 500 mg by mouth 2 (two) times daily as needed for pain.      Marland Kitchen ALPRAZolam (XANAX) 0.25 MG tablet TAKE ONE TABLET BY MOUTH TWICE DAILY AS NEEDED  60 tablet  0  . aspirin 81 MG tablet Take 81 mg by mouth daily.      Marland Kitchen b complex vitamins tablet Take 1 tablet by mouth daily.       . cholecalciferol (VITAMIN D) 1000 UNITS tablet Take 1,000 Units by mouth daily.        .  Coenzyme Q10 (COQ10) 50 MG CAPS Take 1 capsule by mouth daily.        Marland Kitchen doxazosin (CARDURA) 2 MG tablet Take 2 mg by mouth at bedtime.      . furosemide (LASIX) 20 MG tablet Take 20 mg by mouth 2 (two) times daily.      Marland Kitchen gabapentin (NEURONTIN) 100 MG capsule Take 100 mg by mouth 3 (three) times daily.      . Glycerin-Hypromellose-PEG 400 (VISINE PURE TEARS OP) Place 2 drops into both eyes daily.      . hydrALAZINE (APRESOLINE) 25 MG tablet Take 12.5 mg by mouth 2 (two) times daily.      Marland Kitchen HYDROcodone-acetaminophen (NORCO/VICODIN) 5-325 MG per tablet TAKE ONE TABLET BY MOUTH TWICE DAILY AS NEEDED  60 tablet  0  . nebivolol (BYSTOLIC) 5 MG tablet Take 5 mg by mouth 2 (two) times daily.       . NON FORMULARY Take 1 tablet by mouth daily at 6 PM. SEA  CALCIUM      . NON FORMULARY E-tea tablets twice daily      . omeprazole (PRILOSEC) 20 MG capsule Take 20 mg by mouth daily as needed. For reflux      . zolpidem (AMBIEN) 5 MG tablet TAKE ONE TABLET BY MOUTH ONCE DAILY AT BEDTIME  30 tablet  1   No current facility-administered medications for this visit.    Allergies  Allergen Reactions  . Doxycycline Monohydrate Nausea And Vomiting  . Ramipril     REACTION: worsening renal function    Past Medical History  Diagnosis Date  . History of diverticulitis of colon   . HLD (hyperlipidemia)   . Anxiety   . Osteoporosis   . Depression   . GERD (gastroesophageal reflux disease)   . IBS (irritable bowel syndrome)   . Glucose intolerance (impaired glucose tolerance)   . Allergic rhinitis   . Migraine   . HTN (hypertension)     Dr. Martinique, cards  . SUI (stress urinary incontinence, female)     Dr. Amalia Hailey, urology (some urge as well)  . Diverticulosis     colonsocopy 2002 aborted 2/2 tortuous colon and heavy sigmoid diverticulosis  . Dysphagia     EGD 2012 - wnl, s/p esoph dilation  . CKD (chronic kidney disease) stage 3, GFR 30-59 ml/min   . Diastolic dysfunction     per echo in November  2012; normal EF  . DDD (degenerative disc disease), lumbar   . 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   Marland Kitchen History of CVA (cerebrovascular accident) 2012    old per prior head CT    Past Surgical History  Procedure Laterality Date  . Tonsillectomy  1942  . Submucous resection  1962  . Tubal ligation  1973  . Breast enhancement surgery  1982  . Total abdominal hysterectomy  1996    for frequent dysmennorhea  . Macroplastique implantation  03/2008    Dr. Rogers Blocker  . Uretherolysis and removal of macroplastique  09/13/08    Dr. Amalia Hailey  . Ct head limited w/o cm  04/2010    nothing acute, ? old infarcts L internal capsule  . Renal doppler  2009?    simple cysts, wnl  . Colonoscopy  2002    does not want another!  . Esophagogastroduodenoscopy  2012    WNL, s/p esoph dilation (Dr. Candace Cruise, [facility])  . US echocardiography  11/2010    EF 55-60%, mild LAE, mild diastolic dysfunction, normal valves    History  Smoking status  . Former Smoker -- 0.50 packs/day for 54 years  . Types: Cigarettes  . Start date: 01/18/1954  . Quit date: 01/19/2008  Smokeless tobacco  . Never Used    History  Alcohol Use  . Yes    Comment: 1 1/2-2 glasses white wine/day    Family History  Problem Relation Age of Onset  . Heart failure Father   . Heart disease Father   . Cancer Father     prostate  . Hypertension Father   . Coronary artery disease Father   . Leukemia Mother     CLL prior    Review of Systems: The review of systems is per the HPI.  All other systems were reviewed and are negative.  Physical Exam: BP 100/64  Pulse 60  Ht 5\' 2"  (1.575 m)  Wt 131 lb 6.4 oz (59.603 kg)  BMI 24.03 kg/m2 Oxygen sat at rest is 96% - with walking around the office - dropped to 93%.  Patient is very pleasant and in no acute distress. Very well dressed. Weight is stable. Skin is warm and dry. Color is normal.  HEENT is  unremarkable. Normocephalic/atraumatic. PERRL. Sclera are nonicteric. Neck is supple. No masses. No  JVD. Lungs are coarse. Cardiac exam shows a regular rate and rhythm. Abdomen is soft. Extremities are without edema. Gait and ROM are intact. No gross neurologic deficits noted.  LABORATORY DATA: PENDING  Lab Results  Component Value Date   WBC 5.5 10/27/2012   HGB 11.5* 10/27/2012   HCT 34.6* 10/27/2012   PLT 266.0 10/27/2012   GLUCOSE 99 10/27/2012   CHOL 185 02/16/2010   TRIG 56 02/16/2010   HDL 79 02/16/2010   LDLDIRECT 103.5 12/03/2010   LDLCALC 95 02/16/2010   ALT 15 02/16/2010   AST 18 02/16/2010   NA 142 10/27/2012   K 4.8 10/27/2012   CL 108 10/27/2012   CREATININE 2.2* 10/27/2012   BUN 42* 10/27/2012   CO2 26 10/27/2012   TSH 2.62 08/28/2012   INR 1.03 08/19/2012   HGBA1C 5.6 10/02/2008   MICROALBUR 13.4* 08/19/2008   Echo Study Conclusions from March 2014 - Left ventricle: The cavity size was normal. Wall thickness was normal. Systolic function was normal. The estimated ejection fraction was in the range of 60% to 65%. Wall motion was normal; there were no regional wall motion abnormalities. Findings consistent with left ventricular diastolic dysfunction. - Aortic valve: Sclerosis without stenosis. - Mitral valve: Mildly calcified annulus.   Assessment / Plan:  1. Chronic diastolic dysfunction - actually seems compensated based on weights, no swelling, etc. I suspect her dyspnea is more multi factorial - has severe airway obstruction noted on past spirometry. Has never seen pulmonary - could probably benefit from inhaler therapy, etc. Will check labs to include a BNP - refer to pulmonary - see Dr. Martinique later this month as planned.   2. COPD - will refer on to pulmonary.   3. CKD - never seen by nephrology - may need to consider. This may hinder how we can manage her heart failure.   4. HTN - BP looks great - even on the low side today. I have not made any medicine  changes today.   Patient is agreeable to this plan and will call if any problems develop in the interim.   Burtis Junes, RN, [place] [facility] [address] [place], [place]  [id number]

## 2012-11-02 NOTE — Patient Instructions (Addendum)
We will check labs today - this will include a fluid level test  I am referring you to a lung doctor - would like a visit soon   Keep your visit with Dr. Martinique for later this month  Call the [place] office at [phone] if you have any questions, problems or concerns.

## 2012-11-03 ENCOUNTER — Other Ambulatory Visit: Payer: Self-pay | Admitting: Family Medicine

## 2012-11-03 ENCOUNTER — Telehealth: Payer: Self-pay | Admitting: *Deleted

## 2012-11-03 ENCOUNTER — Other Ambulatory Visit: Payer: Medicare Other

## 2012-11-03 DIAGNOSIS — B079 Viral wart, unspecified: Secondary | ICD-10-CM | POA: Diagnosis not present

## 2012-11-03 DIAGNOSIS — Z85828 Personal history of other malignant neoplasm of skin: Secondary | ICD-10-CM | POA: Diagnosis not present

## 2012-11-03 DIAGNOSIS — D485 Neoplasm of uncertain behavior of skin: Secondary | ICD-10-CM | POA: Diagnosis not present

## 2012-11-03 NOTE — Telephone Encounter (Signed)
Spoke with patient and notified her of lab results drawn at your office. Advised of recommendations from you. She has follow up with Dr. Danise [name] on 11-06-12. I couldn't document on her lab results from another office, but wanted you to know she had been notified. Thanks!

## 2012-11-04 NOTE — Telephone Encounter (Signed)
plz phone in and notify patient. 

## 2012-11-05 NOTE — Telephone Encounter (Signed)
Noted  

## 2012-11-06 ENCOUNTER — Other Ambulatory Visit: Payer: Self-pay | Admitting: Family Medicine

## 2012-11-06 ENCOUNTER — Ambulatory Visit (INDEPENDENT_AMBULATORY_CARE_PROVIDER_SITE_OTHER): Payer: Medicare Other | Admitting: Family Medicine

## 2012-11-06 ENCOUNTER — Encounter: Payer: Self-pay | Admitting: Family Medicine

## 2012-11-06 VITALS — BP 124/86 | HR 54 | Temp 97.5°F | Wt 132.5 lb

## 2012-11-06 DIAGNOSIS — R0609 Other forms of dyspnea: Secondary | ICD-10-CM

## 2012-11-06 DIAGNOSIS — J4489 Other specified chronic obstructive pulmonary disease: Secondary | ICD-10-CM

## 2012-11-06 DIAGNOSIS — M810 Age-related osteoporosis without current pathological fracture: Secondary | ICD-10-CM | POA: Diagnosis not present

## 2012-11-06 DIAGNOSIS — N183 Chronic kidney disease, stage 3 unspecified: Secondary | ICD-10-CM

## 2012-11-06 DIAGNOSIS — I10 Essential (primary) hypertension: Secondary | ICD-10-CM | POA: Diagnosis not present

## 2012-11-06 DIAGNOSIS — J449 Chronic obstructive pulmonary disease, unspecified: Secondary | ICD-10-CM

## 2012-11-06 LAB — MICROALBUMIN / CREATININE URINE RATIO: Creatinine,U: 48.4 mg/dL

## 2012-11-06 LAB — RENAL FUNCTION PANEL
Calcium: 9.2 mg/dL (ref 8.4–10.5)
Chloride: 105 mEq/L (ref 96–112)
Creatinine, Ser: 2.3 mg/dL — ABNORMAL HIGH (ref 0.4–1.2)
Glucose, Bld: 99 mg/dL (ref 70–99)
Phosphorus: 4.7 mg/dL — ABNORMAL HIGH (ref 2.3–4.6)
Sodium: 140 mEq/L (ref 135–145)

## 2012-11-06 NOTE — Assessment & Plan Note (Signed)
Stable.  Continue meds

## 2012-11-06 NOTE — Patient Instructions (Signed)
We will set you up for bone density scan and kidney ultrasound around your schedule. Increase water intake by 8 ounces every day. Continue lasix 20mg  twice daily. Blood work today. We will also refer you to kidney specialist - this may take a few months.

## 2012-11-06 NOTE — Assessment & Plan Note (Signed)
Recheck DEXA.  Pt agrees. Continue sea calcium and vit D regularly. Would consider prolia in setting of CKD.

## 2012-11-06 NOTE — Assessment & Plan Note (Signed)
Progressive.  Today actually symptoms stable, weight stable, blood pressure stable.  Last BNP 100s. She has been referred to pulm for h/o COPD.  Appreciate Dr. Gustavus Bryant input as to cause of her dyspnea on exertion.

## 2012-11-06 NOTE — Telephone Encounter (Signed)
Rx called in as directed and patient notified.  

## 2012-11-06 NOTE — Assessment & Plan Note (Signed)
Spirometry: (01/2012) FVC 59%, FEV1 47%, ratio 0.59 => severe obstruction, poor response to albuterol. Pt did trial of foradil early this year, but cost was an issue. No h/o recurrent respiratory infections but does have baseline dyspnea. Will await pulm eval.  Did not add COPD med today.

## 2012-11-06 NOTE — Assessment & Plan Note (Signed)
Has progressively worsened over last 3-6 months, in setting of recent changes to her lasix dose. We will check SPEP and urine microalbumin today. Recheck today, recheck recent hyperkalemia as well.  Pt has decreased bananas in diet. I will also obtain renal ultrasound and refer to nephrology to establish with stage 4 CKD.  H/o renal doppler showing bilateral cysts (?2009)

## 2012-11-06 NOTE — Progress Notes (Addendum)
Subjective:    Patient ID: Nicole English, female    DOB: 04/14/1932, 79 y.o.   MRN: [id number]  HPI CC: f/u visit  OP - no recent dexa scan.  Discussed.  Pt took calcium with Vit D for 10 years as part of [employer].  Has never been on other osteoporosis medication.  CKD - stage 3-->4 over last 3 months in setting of recent changes to diuretic dose 2/2 concern for CHF instability leading to progressive dyspnea on exertion.  Progressive DOE - seen by cardiology last week, though more related to pulmonary function than diastolic dysfunction.  She has been referred to pulmonology.  Appt with Dr. Melvyn Novas scheduled for tomorrow to establish for severe COPD.  Wt Readings from Last 3 Encounters:  11/06/12 132 lb 8 oz (60.102 kg)  11/02/12 131 lb 6.4 oz (59.603 kg)  10/27/12 131 lb 8 oz (59.648 kg)   Medications and allergies reviewed and updated in chart.  Past histories reviewed and updated if relevant as below. Patient Active Problem List   Diagnosis Date Noted  . DOE (dyspnea on exertion) 10/27/2012  . Bradycardia 08/18/2012  . Near syncope 04/06/2012  . Altered mental status 03/29/2012  . Chronic back pain 03/29/2012  . COPD GOLD III 10/26/2011  . Diastolic dysfunction   . Medicare annual wellness visit, initial 12/09/2010  . Dysphagia 07/10/2010  . History of diverticulitis of colon   . HLD (hyperlipidemia)   . Anxiety   . Depression   . GERD (gastroesophageal reflux disease)   . IBS (irritable bowel syndrome)   . Allergic rhinitis   . Migraine   . HTN (hypertension)   . SUI (stress urinary incontinence, female)   . CKD (chronic kidney disease) stage 4, GFR 15-29 ml/min 12/01/2009  . VITAMIN D DEFICIENCY 08/21/2008  . ANEMIA, NORMOCYTIC 08/21/2008  . OSTEOPOROSIS 05/19/2007  . INSOMNIA-SLEEP DISORDER-UNSPEC 05/19/2007   Past Medical History  Diagnosis Date  . History of diverticulitis of colon   . HLD (hyperlipidemia)   . Anxiety   . Osteoporosis   .  Depression   . GERD (gastroesophageal reflux disease)   . IBS (irritable bowel syndrome)   . Glucose intolerance (impaired glucose tolerance)   . Allergic rhinitis   . Migraine   . HTN (hypertension)     Dr. Martinique, cards  . SUI (stress urinary incontinence, female)     Dr. Amalia Hailey, urology (some urge as well)  . Diverticulosis     colonsocopy 2002 aborted 2/2 tortuous colon and heavy sigmoid diverticulosis  . Dysphagia     EGD 2012 - wnl, s/p esoph dilation  . CKD (chronic kidney disease) stage 3, GFR 30-59 ml/min   . Diastolic dysfunction     per echo in November 2012; normal EF  . DDD (degenerative disc disease), lumbar   . 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   Marland Kitchen History of CVA (cerebrovascular accident) 2012    old per prior head CT   Past Surgical History  Procedure Laterality Date  . Tonsillectomy  1942  . Submucous resection  1962  . Tubal ligation  1973  . Breast enhancement surgery  1982  . Total abdominal hysterectomy  1996    for frequent dysmennorhea  . Macroplastique implantation  03/2008    Dr. Rogers Blocker  . Uretherolysis and removal of macroplastique  09/13/08    Dr. Amalia Hailey  . Ct head limited  w/o cm  04/2010    nothing acute, ? old infarcts L internal capsule  . Renal doppler  2009?    simple cysts, wnl  . Colonoscopy  2002    does not want another!  . Esophagogastroduodenoscopy  2012    WNL, s/p esoph dilation (Dr. Candace Cruise, [facility])  . US echocardiography  11/2010    EF 55-60%, mild LAE, mild diastolic dysfunction, normal valves   History  Substance Use Topics  . Smoking status: Former Smoker -- 0.50 packs/day for 54 years    Types: Cigarettes    Start date: 01/18/1954    Quit date: 01/19/2008  . Smokeless tobacco: Never Used  . Alcohol Use: Yes     Comment: 1 1/2-2 glasses white wine/day   Family History  Problem Relation Age of Onset  . Heart failure Father   . Heart disease  Father   . Cancer Father     prostate  . Hypertension Father   . Coronary artery disease Father   . Leukemia Mother     CLL prior   Allergies  Allergen Reactions  . Doxycycline Monohydrate Nausea And Vomiting  . Ramipril     REACTION: worsening renal function   Current Outpatient Prescriptions on File Prior to Visit  Medication Sig Dispense Refill  . acetaminophen (TYLENOL) 500 MG tablet Take 500 mg by mouth 2 (two) times daily as needed for pain.      Marland Kitchen ALPRAZolam (XANAX) 0.25 MG tablet TAKE ONE TABLET BY MOUTH TWICE DAILY AS NEEDED  60 tablet  0  . aspirin 81 MG tablet Take 81 mg by mouth daily.      Marland Kitchen b complex vitamins tablet Take 1 tablet by mouth daily.       . cholecalciferol (VITAMIN D) 1000 UNITS tablet Take 1,000 Units by mouth daily.        . Coenzyme Q10 (COQ10) 50 MG CAPS Take 1 capsule by mouth daily.        Marland Kitchen doxazosin (CARDURA) 2 MG tablet Take 2 mg by mouth at bedtime.      . furosemide (LASIX) 20 MG tablet Take 20 mg by mouth 2 (two) times daily.      Marland Kitchen gabapentin (NEURONTIN) 100 MG capsule Take 100 mg by mouth 3 (three) times daily.      . Glycerin-Hypromellose-PEG 400 (VISINE PURE TEARS OP) Place 2 drops into both eyes daily.      . hydrALAZINE (APRESOLINE) 25 MG tablet Take 12.5 mg by mouth 2 (two) times daily.      Marland Kitchen HYDROcodone-acetaminophen (NORCO/VICODIN) 5-325 MG per tablet TAKE ONE TABLET BY MOUTH TWICE DAILY AS NEEDED  60 tablet  0  . nebivolol (BYSTOLIC) 5 MG tablet Take 5 mg by mouth 2 (two) times daily.       . NON FORMULARY Take 1 tablet by mouth daily at 6 PM. SEA  CALCIUM      . NON FORMULARY E-tea tablets twice daily      . omeprazole (PRILOSEC) 20 MG capsule Take 20 mg by mouth daily as needed. For reflux       No current facility-administered medications on file prior to visit.     Review of Systems Per HPI    Objective:   Physical Exam  Nursing note and vitals reviewed. Constitutional: She appears well-developed and well-nourished. No  distress.  HENT:  Mouth/Throat: Oropharynx is clear and moist. No oropharyngeal exudate.  Cardiovascular: Normal rate, regular rhythm, normal heart sounds and intact distal  pulses.   No murmur heard. Pulmonary/Chest: Effort normal and breath sounds normal. No respiratory distress. She has no wheezes. She has no rales.  Musculoskeletal: She exhibits no edema.  Skin: Skin is warm and dry. No rash noted.  Psychiatric: She has a normal mood and affect.       Assessment & Plan:

## 2012-11-07 ENCOUNTER — Ambulatory Visit (INDEPENDENT_AMBULATORY_CARE_PROVIDER_SITE_OTHER): Payer: Medicare Other | Admitting: Internal Medicine

## 2012-11-07 ENCOUNTER — Encounter: Payer: Self-pay | Admitting: Internal Medicine

## 2012-11-07 VITALS — BP 104/64 | HR 50 | Temp 97.8°F | Ht 62.0 in | Wt 134.0 lb

## 2012-11-07 DIAGNOSIS — J449 Chronic obstructive pulmonary disease, unspecified: Secondary | ICD-10-CM | POA: Diagnosis not present

## 2012-11-07 LAB — PTH, INTACT AND CALCIUM: Calcium: 9.4 mg/dL (ref 8.4–10.5)

## 2012-11-07 NOTE — Telephone Encounter (Signed)
Rx was called in yesterday and patient was notified at [place].

## 2012-11-07 NOTE — Patient Instructions (Signed)
You have moderate copd from smoking but it's not  Presently limiting your breathing - I will send Dr Darnell Level some recommendations for future reference but no pulmonary follow up is needed at this point

## 2012-11-07 NOTE — Telephone Encounter (Signed)
plz phone in. 

## 2012-11-07 NOTE — Progress Notes (Signed)
  Subjective:    Patient ID: Nicole English, female    DOB: 12-Jan-1933    MRN: [id number]  HPI  41 yowf quit smoking around 2004 with GOLD III COPD by spirometry 01/2012 referred 11/07/2012 to pulmonary clinic by [facility]/ Dr Martinique for copd eval.   11/07/2012 1st [facility] office visit/ Wert cc variably severe x ? One year at its worst  Doe x 50 ft ? much better p diuresis, no assoc cough x one month though record says underwent spirometry 01/2012 for sob  >  Insists she is not limited by sob "once they got my swelling/ fluid off".  Record shows tried foradil but she doesn't remember this,  No obvious day to day or daytime variabilty or assoc   cp or chest tightness, subjective wheeze  or hb symptoms. No unusual exp hx or h/o childhood pna/ asthma or knowledge of premature birth.  Sleeping ok without nocturnal  or early am exacerbation  of respiratory  c/o's or need for noct saba. Also denies any obvious fluctuation of symptoms with weather or environmental changes or other aggravating or alleviating factors except as outlined above   Current Medications, Allergies, Complete Past Medical History, Past Surgical History, Family History, and Social History were reviewed in [employer] record.    Review of Systems  Constitutional: Negative for fever, chills and unexpected weight change.  HENT: Positive for congestion, rhinorrhea and sinus pressure. Negative for dental problem, ear pain, nosebleeds, postnasal drip, sneezing, sore throat, trouble swallowing and voice change.   Eyes: Negative for visual disturbance.  Respiratory: Positive for shortness of breath. Negative for cough and choking.   Cardiovascular: Negative for chest pain and leg swelling.  Gastrointestinal: Negative for vomiting, abdominal pain and diarrhea.  Genitourinary: Negative for difficulty urinating.  Musculoskeletal: Positive for arthralgias.  Skin: Negative for rash.  Neurological: Positive  for headaches. Negative for tremors and syncope.  Hematological: Does not bruise/bleed easily.       Objective:   Physical Exam  Wt Readings from Last 3 Encounters:  11/07/12 134 lb (60.782 kg)  11/06/12 132 lb 8 oz (60.102 kg)  11/02/12 131 lb 6.4 oz (59.603 kg)     Pleasant amb wf very confused with details regarding her care, denies c/o sob when spirometry was ordered 10 mo prior to OV    HEENT mild turbinate edema.  Oropharynx no thrush or excess pnd or cobblestoning.  No JVD or cervical adenopathy. Mild accessory muscle hypertrophy. Trachea midline, nl thryroid. Chest was hyperinflated by percussion with diminished breath sounds and moderate increased exp time without wheeze. Hoover sign positive at mid inspiration. Regular rate and rhythm without murmur gallop or rub or increase P2 or edema.  Abd: no hsm, nl excursion. Ext warm without cyanosis or clubbing.    08/21/12 cxr  No acute disease.     Assessment & Plan:

## 2012-11-07 NOTE — Assessment & Plan Note (Addendum)
Spirometry 01/31/12   FVC 59%, FEV1 (.88) 47%, ratio 0.59 =>  GOLD III no response to B 2  - 11/07/2012  Walked RA x 3 laps @ 185 ft each stopped due to  End of study , no desat, no sob  As I explained to this patient in detail:  although there may be significant copd present, it does not appear to be limiting activity tolerance any more than a set of worn tires limits someone from driving a car  around a parking lot.  A new set of Michelins might look good but would have no perceived impact on the performance of the car and would not be worth the cost.  That is to say:   this pt is so sedentary I don't recommend aggressive pulmonary rx at this point unless limiting symptoms arise or acute exacerbations become as issue, neither of which are the case now.  I asked the patient to contact this office at any time in the future should either of these problems arise.    I would comment that she is very easily confused when asked questions about her care so the simpler we keep things the better.

## 2012-11-08 LAB — PROTEIN ELECTROPHORESIS, SERUM
Alpha-2-Globulin: 9.4 % (ref 7.1–11.8)
Beta Globulin: 6.1 % (ref 4.7–7.2)
Gamma Globulin: 13.1 % (ref 11.1–18.8)
Total Protein, Serum Electrophoresis: 6.7 g/dL (ref 6.0–8.3)

## 2012-11-10 ENCOUNTER — Telehealth: Payer: Self-pay | Admitting: Cardiology

## 2012-11-10 ENCOUNTER — Other Ambulatory Visit: Payer: Self-pay | Admitting: Family Medicine

## 2012-11-10 DIAGNOSIS — N184 Chronic kidney disease, stage 4 (severe): Secondary | ICD-10-CM

## 2012-11-10 DIAGNOSIS — E875 Hyperkalemia: Secondary | ICD-10-CM

## 2012-11-10 NOTE — Telephone Encounter (Signed)
New Problem:  Pt states she wants her blood test results from last week. Please advise

## 2012-11-10 NOTE — Telephone Encounter (Signed)
Returned call to patient 11/02/12 lab results given.Potassium elevated.Advised to decrease potassium in diet.Advised will repeat bmet at 11/13/12 office visit with Dr.Jordan.

## 2012-11-12 ENCOUNTER — Other Ambulatory Visit: Payer: Self-pay | Admitting: Family Medicine

## 2012-11-12 NOTE — Telephone Encounter (Signed)
Last office visit 11/06/2012.  Ok to refill?

## 2012-11-12 NOTE — Telephone Encounter (Signed)
plz phone in. 

## 2012-11-13 ENCOUNTER — Ambulatory Visit (INDEPENDENT_AMBULATORY_CARE_PROVIDER_SITE_OTHER): Payer: Medicare Other | Admitting: Cardiology

## 2012-11-13 ENCOUNTER — Encounter: Payer: Self-pay | Admitting: Cardiology

## 2012-11-13 VITALS — BP 100/60 | HR 60 | Ht 62.0 in | Wt 134.4 lb

## 2012-11-13 DIAGNOSIS — I10 Essential (primary) hypertension: Secondary | ICD-10-CM

## 2012-11-13 DIAGNOSIS — E785 Hyperlipidemia, unspecified: Secondary | ICD-10-CM

## 2012-11-13 DIAGNOSIS — I5189 Other ill-defined heart diseases: Secondary | ICD-10-CM

## 2012-11-13 DIAGNOSIS — I519 Heart disease, unspecified: Secondary | ICD-10-CM | POA: Diagnosis not present

## 2012-11-13 NOTE — Telephone Encounter (Signed)
Rx called in as directed.   

## 2012-11-13 NOTE — Patient Instructions (Signed)
Continue your current therapy  I will see you in 4 months  

## 2012-11-13 NOTE — Progress Notes (Signed)
[name] Date of Birth: [birth year] Medical Record [id number]  History of Present Illness: Nicole English is seen back today for followup. She has a history of HTN, diastolic dysfunction, HLD, COPD - with severe airway obstruction on spirometry noted back in January of 2014, GERD, anxiety, depression, diverticulosis and CKD. Intolerant to ACE/ARB due to renal function. Intolerant to Norvasc due to edema. HCTZ has affected her renal indices. Intolerant to higher doses of hydralazine due to swelling and headache. Last Myoview at [facility] 04/2010 with no ischemia and EF of 90%. Echo 03/2012 with EF 60 to [id number], diastolic dysfunction, MAC. Has bilateral carotid disease of 0 to 39% per doppler in 04/2012. Negative event monitor in 03/2012.  Patient was seen recently by pulmonary. It was felt that her COPD was not a limiting factor at this time. She does complain a lack of energy but otherwise feels well. Her blood pressure has been in excellent control. She is still taking Lasix 20 mg twice a day and this has resolved her swelling.   Current Outpatient Prescriptions  Medication Sig Dispense Refill  . acetaminophen (TYLENOL) 500 MG tablet Take 500 mg by mouth 2 (two) times daily as needed for pain.      Marland Kitchen ALPRAZolam (XANAX) 0.25 MG tablet TAKE ONE TABLET BY MOUTH TWICE DAILY AS NEEDED.  60 tablet  0  . aspirin 81 MG tablet Take 81 mg by mouth daily.      Marland Kitchen b complex vitamins tablet Take 1 tablet by mouth daily.       . cholecalciferol (VITAMIN D) 1000 UNITS tablet Take 1,000 Units by mouth daily.        . Coenzyme Q10 (COQ10) 50 MG CAPS Take 1 capsule by mouth daily.        Marland Kitchen doxazosin (CARDURA) 2 MG tablet Take 2 mg by mouth at bedtime.      . furosemide (LASIX) 20 MG tablet Take 20 mg by mouth 2 (two) times daily.      Marland Kitchen gabapentin (NEURONTIN) 100 MG capsule Take 100 mg by mouth 3 (three) times daily.      . Glycerin-Hypromellose-PEG 400 (VISINE PURE TEARS OP) Place 2 drops into both eyes daily.      .  hydrALAZINE (APRESOLINE) 25 MG tablet Take 12.5 mg by mouth 2 (two) times daily.      Marland Kitchen HYDROcodone-acetaminophen (NORCO/VICODIN) 5-325 MG per tablet TAKE ONE TABLET BY MOUTH TWICE DAILY AS NEEDED  60 tablet  0  . nebivolol (BYSTOLIC) 5 MG tablet Take 5 mg by mouth 2 (two) times daily.       . NON FORMULARY Take 1 tablet by mouth daily at 6 PM. SEA  CALCIUM      . NON FORMULARY E-tea tablets twice daily      . omeprazole (PRILOSEC) 20 MG capsule Take 20 mg by mouth daily as needed. For reflux      . zolpidem (AMBIEN) 5 MG tablet TAKE ONE TABLET BY MOUTH ONCE DAILY AT BEDTIME  30 tablet  0   No current facility-administered medications for this visit.    Allergies  Allergen Reactions  . Doxycycline Monohydrate Nausea And Vomiting  . Ramipril     REACTION: worsening renal function    Past Medical History  Diagnosis Date  . History of diverticulitis of colon   . HLD (hyperlipidemia)   . Anxiety   . Osteoporosis   . Depression   . GERD (gastroesophageal reflux disease)   . IBS (irritable  bowel syndrome)   . Glucose intolerance (impaired glucose tolerance)   . Allergic rhinitis   . Migraine   . HTN (hypertension)     Dr. Martinique, cards  . SUI (stress urinary incontinence, female)     Dr. Amalia Hailey, urology (some urge as well)  . Diverticulosis     colonsocopy 2002 aborted 2/2 tortuous colon and heavy sigmoid diverticulosis  . Dysphagia     EGD 2012 - wnl, s/p esoph dilation  . CKD (chronic kidney disease) stage 3, GFR 30-59 ml/min   . Diastolic dysfunction     per echo in November 2012; normal EF  . DDD (degenerative disc disease), lumbar   . 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   Marland Kitchen History of CVA (cerebrovascular accident) 2012    old per prior head CT    Past Surgical History  Procedure Laterality Date  . Tonsillectomy  1942  . Submucous resection  1962  . Tubal ligation  1973  . Breast  enhancement surgery  1982  . Total abdominal hysterectomy  1996    for frequent dysmennorhea  . Macroplastique implantation  03/2008    Dr. Rogers Blocker  . Uretherolysis and removal of macroplastique  09/13/08    Dr. Amalia Hailey  . Ct head limited w/o cm  04/2010    nothing acute, ? old infarcts L internal capsule  . Renal doppler  2009?    simple cysts, wnl  . Colonoscopy  2002    does not want another!  . Esophagogastroduodenoscopy  2012    WNL, s/p esoph dilation (Dr. Candace Cruise, [facility])  . US echocardiography  11/2010    EF 55-60%, mild LAE, mild diastolic dysfunction, normal valves    History  Smoking status  . Former Smoker -- 0.50 packs/day for 54 years  . Types: Cigarettes  . Start date: 01/18/1954  . Quit date: 01/19/2008  Smokeless tobacco  . Never Used    History  Alcohol Use  . Yes    Comment: 1 1/2-2 glasses white wine/day    Family History  Problem Relation Age of Onset  . Heart failure Father   . Heart disease Father   . Cancer Father     prostate  . Hypertension Father   . Coronary artery disease Father   . Leukemia Mother     CLL prior    Review of Systems: The review of systems is per the HPI.  All other systems were reviewed and are negative.  Physical Exam: BP 100/60  Pulse 60  Ht 5\' 2"  (1.575 m)  Wt 134 lb 6.4 oz (60.963 kg)  BMI 24.58 kg/m2  Patient is very pleasant and in no acute distress. Weight is stable. Skin is warm and dry. Color is normal.  HEENT is unremarkable. Normocephalic/atraumatic. PERRL. Sclera are nonicteric. Neck is supple. No masses. No JVD. Lungs are coarse. Cardiac exam shows a regular rate and rhythm. Abdomen is soft. Extremities are without edema. Gait and ROM are intact. No gross neurologic deficits noted.  LABORATORY DATA: PENDING  Lab Results  Component Value Date   WBC 5.8 11/02/2012   HGB 12.0 11/02/2012   HCT 35.8* 11/02/2012   PLT 275.0 11/02/2012   GLUCOSE 99 11/06/2012   CHOL 185 02/16/2010   TRIG 56 02/16/2010   HDL  79 02/16/2010   LDLDIRECT 103.5 12/03/2010   LDLCALC 95 02/16/2010   ALT 15 02/16/2010   AST 18  02/16/2010   NA 140 11/06/2012   K 4.9 11/06/2012   CL 105 11/06/2012   CREATININE 2.3* 11/06/2012   BUN 52* 11/06/2012   CO2 25 11/06/2012   TSH 2.62 08/28/2012   INR 1.03 08/19/2012   HGBA1C 5.6 10/02/2008   MICROALBUR 11.4* 11/06/2012   Echo Study Conclusions from March 2014 - Left ventricle: The cavity size was normal. Wall thickness was normal. Systolic function was normal. The estimated ejection fraction was in the range of 60% to 65%. Wall motion was normal; there were no regional wall motion abnormalities. Findings consistent with left ventricular diastolic dysfunction. - Aortic valve: Sclerosis without stenosis. - Mitral valve: Mildly calcified annulus.   Assessment / Plan:  1. Chronic diastolic dysfunction - well compensated based on weight and lack of swelling. Last BNP was down to 179. We will continue her current dose of Lasix.   2. COPD   3. CKD -stage III. Creatinine 2.3.  4. HTN - BP looks great. We discussed reducing some of her medication but she is very pleased with her results and wants to continue same.Marland Kitchen

## 2012-11-16 ENCOUNTER — Other Ambulatory Visit: Payer: Self-pay | Admitting: Cardiology

## 2012-11-16 ENCOUNTER — Other Ambulatory Visit: Payer: Self-pay | Admitting: *Deleted

## 2012-11-16 MED ORDER — FUROSEMIDE 20 MG PO TABS: 20.0000 mg | ORAL_TABLET | Freq: Two times a day (BID) | ORAL | Status: DC

## 2012-11-16 MED ORDER — HYDRALAZINE HCL 25 MG PO TABS: 12.5000 mg | ORAL_TABLET | Freq: Two times a day (BID) | ORAL | Status: DC

## 2012-11-16 NOTE — Telephone Encounter (Signed)
Patient getting ready to go on trip and needs refills on 2 medications before she leaves

## 2012-12-04 ENCOUNTER — Other Ambulatory Visit: Payer: Self-pay | Admitting: Family Medicine

## 2012-12-04 NOTE — Telephone Encounter (Signed)
plz phone in. 

## 2012-12-04 NOTE — Telephone Encounter (Signed)
Rx called in as directed.   

## 2012-12-06 ENCOUNTER — Ambulatory Visit
Admission: RE | Admit: 2012-12-06 | Discharge: 2012-12-06 | Disposition: A | Discharge status: Home / Self Care | Payer: Medicare Other | Source: Ambulatory Visit | Attending: Family Medicine | Admitting: Family Medicine

## 2012-12-06 DIAGNOSIS — N281 Cyst of kidney, acquired: Secondary | ICD-10-CM | POA: Diagnosis not present

## 2012-12-07 ENCOUNTER — Other Ambulatory Visit: Payer: Self-pay

## 2012-12-07 MED ORDER — HYDROCODONE-ACETAMINOPHEN 5-325 MG PO TABS: ORAL_TABLET | ORAL | Status: DC

## 2012-12-07 NOTE — Telephone Encounter (Signed)
Printed and placed in Kim's box.  See attached result to [place].

## 2012-12-07 NOTE — Telephone Encounter (Signed)
Patient notified and Rx placed up front for pick up. Advised to bring ID when she comes to pick up Rx. She verbalized understanding.

## 2012-12-07 NOTE — Telephone Encounter (Signed)
Pt left v/m requesting rx hydrocodone apap; call pt when ready for pick up. Pt also request cb on Renal US results done 12/06/12.

## 2012-12-08 ENCOUNTER — Telehealth: Payer: Self-pay

## 2012-12-08 NOTE — Telephone Encounter (Signed)
Pt wanted to know since 01/06/13 is on a Sat when can pt call to get Hydrocodone refilled; advised pt can call at first of week and put request for refill and pt can pick up a end of week prior to running out of med. Pt voiced understanding.

## 2012-12-11 ENCOUNTER — Other Ambulatory Visit: Payer: Self-pay | Admitting: Family Medicine

## 2012-12-12 ENCOUNTER — Other Ambulatory Visit: Payer: Self-pay | Admitting: Family Medicine

## 2012-12-12 DIAGNOSIS — I10 Essential (primary) hypertension: Secondary | ICD-10-CM | POA: Diagnosis not present

## 2012-12-12 DIAGNOSIS — D631 Anemia in chronic kidney disease: Secondary | ICD-10-CM | POA: Diagnosis not present

## 2012-12-12 DIAGNOSIS — N2581 Secondary hyperparathyroidism of renal origin: Secondary | ICD-10-CM | POA: Diagnosis not present

## 2012-12-12 DIAGNOSIS — N184 Chronic kidney disease, stage 4 (severe): Secondary | ICD-10-CM | POA: Diagnosis not present

## 2012-12-12 DIAGNOSIS — N179 Acute kidney failure, unspecified: Secondary | ICD-10-CM | POA: Diagnosis not present

## 2012-12-12 NOTE — Telephone Encounter (Signed)
Plz phone in

## 2012-12-12 NOTE — Telephone Encounter (Signed)
Rx called in as directed.   

## 2012-12-18 DIAGNOSIS — N184 Chronic kidney disease, stage 4 (severe): Secondary | ICD-10-CM

## 2012-12-18 HISTORY — DX: Chronic kidney disease, stage 4 (severe): N18.4

## 2012-12-26 ENCOUNTER — Telehealth: Payer: Self-pay | Admitting: Cardiology

## 2012-12-26 NOTE — Telephone Encounter (Signed)
Returned call to patient she wanted to ask Dr.Jordan if ok to use Rogaine for hair loss.Message sent to [place] for advice.

## 2012-12-26 NOTE — Telephone Encounter (Signed)
New message    Is it ok to use rogain for hair loss.  Instructions say to ask your doctor before using if you are a heart pt

## 2012-12-27 ENCOUNTER — Other Ambulatory Visit: Payer: Self-pay | Admitting: Cardiology

## 2012-12-27 NOTE — Telephone Encounter (Signed)
OK to use Rogain. Could potentially lower BP but absorption is low. Keep an eye on BP but Ok to give it a try.  Darcel Frane Martinique MD, [facility]

## 2012-12-28 NOTE — Telephone Encounter (Signed)
Spoke to patient Dr.Jordan advised ok to use Rogain.Advised could lower B/P,monitor B/P.

## 2012-12-29 ENCOUNTER — Telehealth: Payer: Self-pay | Admitting: Cardiology

## 2012-12-29 NOTE — Telephone Encounter (Signed)
Returned call to patient she stated she woke up around 1:00 am B/P elevated 153/100 pulse 58,172/116 pulse 57,at 3:00 pm 151/103 pulse 54.Dr.Jordan out of office, spoke to DOD Dr.Katz he advised to increase Hydralazine 25 mg twice a day.Advised to continue to monitor B/P and call back if continues to be elevated.Patient stated ever since she started Hydralazine 1&1/2 years ago she feels like she has a tire around her waist.Stated she will increase and call me back if continues to be elevated.

## 2012-12-29 NOTE — Telephone Encounter (Signed)
New Problem:  Pt states she woke up at 1 am and could not go back to sleep. Pt states her BP today has been 153/100, 172/116 at 11:45 am this morning, and at 3 pm 151/103. Pt is requesting a call back from the nurse.

## 2013-01-02 ENCOUNTER — Other Ambulatory Visit: Payer: Self-pay

## 2013-01-02 NOTE — Telephone Encounter (Signed)
Pt left v/m requesting rx hydrocodone apap. Call when ready for pick up.  

## 2013-01-03 MED ORDER — HYDROCODONE-ACETAMINOPHEN 5-325 MG PO TABS: ORAL_TABLET | ORAL | Status: DC

## 2013-01-03 NOTE — Telephone Encounter (Signed)
Message left notifying patient and Rx placed up front for pick up. 

## 2013-01-03 NOTE — Telephone Encounter (Signed)
Printed and placed in kim's box

## 2013-01-04 ENCOUNTER — Other Ambulatory Visit: Payer: Self-pay | Admitting: Family Medicine

## 2013-01-04 NOTE — Telephone Encounter (Signed)
Rx called in as prescribed 

## 2013-01-04 NOTE — Telephone Encounter (Signed)
plz phone in. 

## 2013-01-05 ENCOUNTER — Telehealth: Payer: Self-pay

## 2013-01-05 NOTE — Telephone Encounter (Signed)
Pt left v/m requesting status of refills on gabapentin and zolpidem; left v/m for pt to contact [place]; meds was refilled on 01/04/13.

## 2013-01-09 DIAGNOSIS — N184 Chronic kidney disease, stage 4 (severe): Secondary | ICD-10-CM | POA: Diagnosis not present

## 2013-01-09 DIAGNOSIS — I10 Essential (primary) hypertension: Secondary | ICD-10-CM | POA: Diagnosis not present

## 2013-01-09 DIAGNOSIS — R809 Proteinuria, unspecified: Secondary | ICD-10-CM | POA: Diagnosis not present

## 2013-01-09 DIAGNOSIS — D631 Anemia in chronic kidney disease: Secondary | ICD-10-CM | POA: Diagnosis not present

## 2013-01-09 DIAGNOSIS — N2581 Secondary hyperparathyroidism of renal origin: Secondary | ICD-10-CM | POA: Diagnosis not present

## 2013-01-09 LAB — COMPREHENSIVE METABOLIC PANEL
Albumin: 4.4
Creat: 2.32
GLUCOSE: 94
Phosphorus: 5.2 mg/dL — AB (ref 2.5–4.9)
Potassium: 5.3 mmol/L
Sodium: 138 mmol/L (ref 137–147)

## 2013-01-09 LAB — PROTEIN / CREATININE RATIO, URINE: PROTEIN CREATININE RATIO: 560

## 2013-01-13 ENCOUNTER — Other Ambulatory Visit: Payer: Self-pay | Admitting: Family Medicine

## 2013-01-14 NOTE — Telephone Encounter (Signed)
plz phone in. 

## 2013-01-15 ENCOUNTER — Other Ambulatory Visit: Payer: Self-pay | Admitting: Family Medicine

## 2013-01-15 NOTE — Telephone Encounter (Signed)
Rx called in as directed.   

## 2013-01-15 NOTE — Telephone Encounter (Signed)
plz phone in if not already done. 

## 2013-01-16 ENCOUNTER — Encounter: Payer: Self-pay | Admitting: Family Medicine

## 2013-01-19 DIAGNOSIS — N184 Chronic kidney disease, stage 4 (severe): Secondary | ICD-10-CM | POA: Diagnosis not present

## 2013-01-19 DIAGNOSIS — N2581 Secondary hyperparathyroidism of renal origin: Secondary | ICD-10-CM | POA: Diagnosis not present

## 2013-01-19 DIAGNOSIS — D631 Anemia in chronic kidney disease: Secondary | ICD-10-CM | POA: Diagnosis not present

## 2013-01-19 DIAGNOSIS — I10 Essential (primary) hypertension: Secondary | ICD-10-CM | POA: Diagnosis not present

## 2013-01-29 DIAGNOSIS — I129 Hypertensive chronic kidney disease with stage 1 through stage 4 chronic kidney disease, or unspecified chronic kidney disease: Secondary | ICD-10-CM | POA: Diagnosis not present

## 2013-01-29 DIAGNOSIS — N184 Chronic kidney disease, stage 4 (severe): Secondary | ICD-10-CM | POA: Diagnosis not present

## 2013-01-29 DIAGNOSIS — I519 Heart disease, unspecified: Secondary | ICD-10-CM | POA: Diagnosis not present

## 2013-01-31 ENCOUNTER — Encounter: Payer: Self-pay | Admitting: *Deleted

## 2013-02-04 ENCOUNTER — Other Ambulatory Visit: Payer: Self-pay | Admitting: Family Medicine

## 2013-02-04 NOTE — Telephone Encounter (Signed)
plz phone in. 

## 2013-02-05 ENCOUNTER — Other Ambulatory Visit: Payer: Self-pay | Admitting: Family Medicine

## 2013-02-05 NOTE — Telephone Encounter (Signed)
plz phone in if not already done. 

## 2013-02-05 NOTE — Telephone Encounter (Signed)
Already called in this AM.

## 2013-02-05 NOTE — Telephone Encounter (Signed)
Rx called in as directed.   

## 2013-02-12 ENCOUNTER — Other Ambulatory Visit: Payer: Self-pay | Admitting: Family Medicine

## 2013-02-12 NOTE — Telephone Encounter (Signed)
plz phone in. 

## 2013-02-12 NOTE — Telephone Encounter (Signed)
Rx called in as directed.   

## 2013-02-26 ENCOUNTER — Telehealth: Payer: Self-pay | Admitting: Family Medicine

## 2013-02-26 NOTE — Telephone Encounter (Signed)
Patient Information:  [name] Name: Doneisha  Phone: [phone]  Patient: Nicole English, Nicole English  Gender: Female  DOB: 1932/12/13  Age: 80 Years  PCP: Ria [facility])  Office Follow Up:  Does the office need to follow up with this patient?: No  Instructions For The Office: N/A  RN Note:  Home care advice and call back parameters reviewed. Patient expressed understanding  Symptoms  Reason For Call & Symptoms: Patient states onset  +head feeling clogged/headache to back of head,  chills, ears stuffy, sinus pressure. +runny nose and clear.  starting today.  Reviewed Health History In EMR: Yes  Reviewed Medications In EMR: Yes  Reviewed Allergies In EMR: Yes  Reviewed Surgeries / Procedures: Yes  Date of Onset of Symptoms: 02/26/2013  Treatments Tried: Tylenol x1  Treatments Tried Worked: No  Guideline(s) Used:  Sinus Pain and Congestion  Disposition Per Guideline:   Home Care  Reason For Disposition Reached:   Sinus congestion as part of a cold, present < 10 days  Advice Given:  Reassurance:   Sinus congestion is a normal part of a cold.  Usually home treatment with nasal washes can prevent an actual bacterial sinus infection.  For a Runny Nose With Profuse Discharge:  Nasal mucus and discharge helps to wash viruses and bacteria out of the nose and sinuses.  Blowing the nose is all that is needed.  For a Stuffy Nose - Use Nasal Washes:  Introduction: Saline (salt water) nasal irrigation (nasal wash) is an effective and simple home remedy for treating stuffy nose and sinus congestion. The nose can be irrigated by pouring, spraying, or squirting salt water into the nose and then letting it run back out.  How it Helps: The salt water rinses out excess mucus, washes out any irritants (dust, allergens) that might be present, and moistens the nasal cavity.  Hydration:  Drink plenty of liquids (6-8 glasses of water daily). If the air in your home is dry, use a cool mist  humidifier  Pain and Fever Medicines:  For pain or fever relief, take either acetaminophen or ibuprofen.  They are over-the-counter (OTC) drugs that help treat both fever and pain. You can buy them at the drugstore.  Acetaminophen (e.g., Tylenol):  Regular Strength Tylenol: Take 650 mg (two 325 mg pills) by mouth every 4-6 hours as needed. Each Regular Strength Tylenol pill has 325 mg of acetaminophen.  Ibuprofen (e.g., Motrin, Advil):  Take 400 mg (two 200 mg pills) by mouth every 6 hours.  Call Back If:   Severe pain lasts longer than 2 hours after pain medicine  Sinus congestion (fullness) lasts longer than 10 days  You become worse.  Patient Will Follow Care Advice:  YES

## 2013-03-05 ENCOUNTER — Other Ambulatory Visit: Payer: Self-pay | Admitting: Family Medicine

## 2013-03-05 NOTE — Telephone Encounter (Signed)
Rx called in as directed.   

## 2013-03-05 NOTE — Telephone Encounter (Signed)
plz phone in. 

## 2013-03-07 NOTE — Telephone Encounter (Signed)
plz phone in. 

## 2013-03-07 NOTE — Telephone Encounter (Signed)
Called in as directed on 03/05/13.

## 2013-03-12 ENCOUNTER — Other Ambulatory Visit: Payer: Self-pay | Admitting: Family Medicine

## 2013-03-12 DIAGNOSIS — Z1231 Encounter for screening mammogram for malignant neoplasm of breast: Secondary | ICD-10-CM

## 2013-03-13 ENCOUNTER — Ambulatory Visit: Payer: Medicare Other | Admitting: Cardiology

## 2013-03-15 ENCOUNTER — Other Ambulatory Visit: Payer: Self-pay | Admitting: Family Medicine

## 2013-03-15 NOTE — Telephone Encounter (Signed)
plz phone in. 

## 2013-03-16 ENCOUNTER — Encounter (HOSPITAL_COMMUNITY): Payer: Self-pay | Admitting: Emergency Medicine

## 2013-03-16 ENCOUNTER — Emergency Department (HOSPITAL_COMMUNITY): Payer: Medicare Other

## 2013-03-16 DIAGNOSIS — J449 Chronic obstructive pulmonary disease, unspecified: Secondary | ICD-10-CM | POA: Diagnosis not present

## 2013-03-16 DIAGNOSIS — Z8673 Personal history of transient ischemic attack (TIA), and cerebral infarction without residual deficits: Secondary | ICD-10-CM | POA: Diagnosis not present

## 2013-03-16 DIAGNOSIS — E785 Hyperlipidemia, unspecified: Secondary | ICD-10-CM | POA: Insufficient documentation

## 2013-03-16 DIAGNOSIS — Z87891 Personal history of nicotine dependence: Secondary | ICD-10-CM | POA: Diagnosis not present

## 2013-03-16 DIAGNOSIS — R6889 Other general symptoms and signs: Secondary | ICD-10-CM | POA: Diagnosis not present

## 2013-03-16 DIAGNOSIS — Y929 Unspecified place or not applicable: Secondary | ICD-10-CM | POA: Insufficient documentation

## 2013-03-16 DIAGNOSIS — Z79899 Other long term (current) drug therapy: Secondary | ICD-10-CM | POA: Diagnosis not present

## 2013-03-16 DIAGNOSIS — F411 Generalized anxiety disorder: Secondary | ICD-10-CM | POA: Insufficient documentation

## 2013-03-16 DIAGNOSIS — I10 Essential (primary) hypertension: Secondary | ICD-10-CM | POA: Diagnosis not present

## 2013-03-16 DIAGNOSIS — J4489 Other specified chronic obstructive pulmonary disease: Secondary | ICD-10-CM | POA: Insufficient documentation

## 2013-03-16 DIAGNOSIS — Z8742 Personal history of other diseases of the female genital tract: Secondary | ICD-10-CM | POA: Insufficient documentation

## 2013-03-16 DIAGNOSIS — G43909 Migraine, unspecified, not intractable, without status migrainosus: Secondary | ICD-10-CM | POA: Diagnosis not present

## 2013-03-16 DIAGNOSIS — R071 Chest pain on breathing: Secondary | ICD-10-CM | POA: Diagnosis not present

## 2013-03-16 DIAGNOSIS — N184 Chronic kidney disease, stage 4 (severe): Secondary | ICD-10-CM | POA: Diagnosis not present

## 2013-03-16 DIAGNOSIS — F3289 Other specified depressive episodes: Secondary | ICD-10-CM | POA: Diagnosis not present

## 2013-03-16 DIAGNOSIS — I129 Hypertensive chronic kidney disease with stage 1 through stage 4 chronic kidney disease, or unspecified chronic kidney disease: Secondary | ICD-10-CM | POA: Insufficient documentation

## 2013-03-16 DIAGNOSIS — Z8739 Personal history of other diseases of the musculoskeletal system and connective tissue: Secondary | ICD-10-CM | POA: Diagnosis not present

## 2013-03-16 DIAGNOSIS — Y939 Activity, unspecified: Secondary | ICD-10-CM | POA: Insufficient documentation

## 2013-03-16 DIAGNOSIS — S239XXA Sprain of unspecified parts of thorax, initial encounter: Secondary | ICD-10-CM | POA: Diagnosis not present

## 2013-03-16 DIAGNOSIS — Z7982 Long term (current) use of aspirin: Secondary | ICD-10-CM | POA: Insufficient documentation

## 2013-03-16 DIAGNOSIS — X58XXXA Exposure to other specified factors, initial encounter: Secondary | ICD-10-CM | POA: Insufficient documentation

## 2013-03-16 DIAGNOSIS — F329 Major depressive disorder, single episode, unspecified: Secondary | ICD-10-CM | POA: Diagnosis not present

## 2013-03-16 NOTE — Telephone Encounter (Signed)
Pt request status of alprazolam refill; Medication phoned to [facility] as instructed.pt notified done.

## 2013-03-16 NOTE — ED Notes (Addendum)
Presents with hypertension and right sided occiput pain and right shoulder and right sided back pain. Highest BP 175/105, pulse rate increased from 75 to 99 at times as well. Denies blurred vision. Pt reports "I just don't feel good. I am starting to get nauseated and my head really hurts me" alert and oriented, MAEx4. PERRLA. Recently taken off BP medication to increase kidney function. begani n December. Pt also reports chest tightness at times

## 2013-03-17 ENCOUNTER — Emergency Department (HOSPITAL_COMMUNITY)
Admission: EM | Admit: 2013-03-17 | Discharge: 2013-03-17 | Disposition: A | Discharge status: Home / Self Care | Payer: Medicare Other | Attending: Emergency Medicine | Admitting: Emergency Medicine

## 2013-03-17 DIAGNOSIS — R0789 Other chest pain: Secondary | ICD-10-CM

## 2013-03-17 DIAGNOSIS — S29019A Strain of muscle and tendon of unspecified wall of thorax, initial encounter: Secondary | ICD-10-CM

## 2013-03-17 DIAGNOSIS — I10 Essential (primary) hypertension: Secondary | ICD-10-CM

## 2013-03-17 LAB — CBC
HEMATOCRIT: 36.2 % (ref 36.0–46.0)
Hemoglobin: 11.9 g/dL — ABNORMAL LOW (ref 12.0–15.0)
MCH: 31.2 pg (ref 26.0–34.0)
MCHC: 32.9 g/dL (ref 30.0–36.0)
MCV: 95 fL (ref 78.0–100.0)
Platelets: 310 10*3/uL (ref 150–400)
RBC: 3.81 MIL/uL — AB (ref 3.87–5.11)
RDW: 12.7 % (ref 11.5–15.5)
WBC: 6.6 10*3/uL (ref 4.0–10.5)

## 2013-03-17 LAB — BASIC METABOLIC PANEL
BUN: 32 mg/dL — AB (ref 6–23)
CALCIUM: 9.3 mg/dL (ref 8.4–10.5)
CO2: 24 mEq/L (ref 19–32)
Chloride: 102 mEq/L (ref 96–112)
Creatinine, Ser: 1.84 mg/dL — ABNORMAL HIGH (ref 0.50–1.10)
GFR calc Af Amer: 29 mL/min — ABNORMAL LOW (ref >90)
GFR, EST NON AFRICAN AMERICAN: 25 mL/min — AB (ref >90)
Glucose, Bld: 102 mg/dL — ABNORMAL HIGH (ref 70–99)
Potassium: 4.2 mEq/L (ref 3.7–5.3)
Sodium: 142 mEq/L (ref 137–147)

## 2013-03-17 LAB — I-STAT TROPONIN, ED: Troponin i, poc: 0 ng/mL (ref 0.00–0.08)

## 2013-03-17 MED ORDER — METHOCARBAMOL 500 MG PO TABS
500.0000 mg | ORAL_TABLET | Freq: Once | ORAL | Status: AC
  Administered 2013-03-17: 500 mg via ORAL
  Filled 2013-03-17: qty 1

## 2013-03-17 MED ORDER — METHOCARBAMOL 500 MG PO TABS: 500.0000 mg | ORAL_TABLET | Freq: Two times a day (BID) | ORAL | Status: DC | PRN

## 2013-03-17 MED ORDER — HYDROCODONE-ACETAMINOPHEN 5-325 MG PO TABS
1.0000 | ORAL_TABLET | Freq: Once | ORAL | Status: AC
  Administered 2013-03-17: 1 via ORAL
  Filled 2013-03-17: qty 1

## 2013-03-17 NOTE — ED Provider Notes (Signed)
CSN: [id number]     Arrival date & time 03/16/13  2244 History   First MD Initiated Contact with Patient 03/17/13 0036     Chief Complaint  Patient presents with  . Hypertension     (Consider location/radiation/quality/duration/timing/severity/associated sxs/prior Treatment) HPI Patient is brought in by EMS for concern for elevated blood pressure. She is unable to recall the exact blood pressure which she was concerned about. She states that she is since taking the Xanax and her blood pressures improved. She also complains of right-sided chest and right thoracic back pain. This pain started earlier yesterday morning it has been persistent throughout the day. Is not associated with any shortness of breath or cough. She's had no fevers or chills. The pain is worse with palpation and movement. She denies any heavy lifting or known trauma. She denies any lower extremity swelling or pain. Patient has no known history of coronary artery disease. Past Medical History  Diagnosis Date  . History of diverticulitis of colon   . HLD (hyperlipidemia)   . Anxiety   . Osteoporosis   . Depression   . GERD (gastroesophageal reflux disease)   . IBS (irritable bowel syndrome)   . Glucose intolerance (impaired glucose tolerance)   . Allergic rhinitis   . Migraine   . HTN (hypertension)     Dr. Martinique, cards  . SUI (stress urinary incontinence, female)     Dr. Amalia Hailey, urology (some urge as well)  . Diverticulosis     colonsocopy 2002 aborted 2/2 tortuous colon and heavy sigmoid diverticulosis  . Dysphagia     EGD 2012 - wnl, s/p esoph dilation  . CKD (chronic kidney disease) stage 4, GFR 15-29 ml/min 12/2012    [place]  . Diastolic dysfunction     per echo in November 2012; normal EF  . DDD (degenerative disc disease), lumbar   . 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   Marland Kitchen History of CVA (cerebrovascular accident) 2012   old per prior head CT   Past Surgical History  Procedure Laterality Date  . Tonsillectomy  1942  . Submucous resection  1962  . Tubal ligation  1973  . Breast enhancement surgery  1982  . Total abdominal hysterectomy  1996    for frequent dysmennorhea  . Macroplastique implantation  03/2008    Dr. Rogers Blocker  . Uretherolysis and removal of macroplastique  09/13/08    Dr. Amalia Hailey  . Ct head limited w/o cm  04/2010    nothing acute, ? old infarcts L internal capsule  . Renal doppler  2009?    simple cysts, wnl  . Colonoscopy  2002    does not want another!  . Esophagogastroduodenoscopy  2012    WNL, s/p esoph dilation (Dr. Candace Cruise, [facility])  . US echocardiography  11/2010    EF 55-60%, mild LAE, mild diastolic dysfunction, normal valves   Family History  Problem Relation Age of Onset  . Heart failure Father   . Heart disease Father   . Cancer Father     prostate  . Hypertension Father   . Coronary artery disease Father   . Leukemia Mother     CLL prior   History  Substance Use Topics  . Smoking status: Former Smoker -- 0.50 packs/day for 54 years    Types: Cigarettes    Start date: 01/18/1954    Quit date: 01/19/2008  . Smokeless tobacco: Never  Used  . Alcohol Use: Yes     Comment: 1 1/2-2 glasses white wine/day   OB History   Grav Para Term Preterm Abortions TAB SAB Ect Mult Living                 Review of Systems  Constitutional: Negative for fever and chills.  Respiratory: Negative for cough and shortness of breath.   Cardiovascular: Positive for chest pain. Negative for palpitations and leg swelling.  Gastrointestinal: Negative for nausea, vomiting, abdominal pain and diarrhea.  Genitourinary: Negative for dysuria.  Musculoskeletal: Positive for back pain and myalgias. Negative for neck pain and neck stiffness.  Skin: Negative for rash and wound.  Neurological: Negative for dizziness, weakness, light-headedness, numbness and headaches.  Psychiatric/Behavioral: The  patient is nervous/anxious.   All other systems reviewed and are negative.      Allergies  Doxycycline monohydrate and Ramipril  Home Medications   Current Outpatient Rx  Name  Route  Sig  Dispense  Refill  . acetaminophen (TYLENOL) 500 MG tablet   Oral   Take 500 mg by mouth 2 (two) times daily as needed for pain.         Marland Kitchen ALPRAZolam (XANAX) 0.25 MG tablet   Oral   Take 0.25 mg by mouth 2 (two) times daily as needed for anxiety.         Marland Kitchen aspirin EC 81 MG tablet   Oral   Take 81 mg by mouth daily.         . furosemide (LASIX) 20 MG tablet   Oral   Take 1 tablet (20 mg total) by mouth 2 (two) times daily.   30 tablet   11     Patient takes in the morning and at 2:00pm   . gabapentin (NEURONTIN) 100 MG capsule   Oral   Take 100 mg by mouth 3 (three) times daily.         Marland Kitchen HYDROcodone-acetaminophen (NORCO/VICODIN) 5-325 MG per tablet   Oral   Take 0.5 tablets by mouth 2 (two) times daily as needed for moderate pain.         Marland Kitchen zolpidem (AMBIEN) 5 MG tablet   Oral   Take 5 mg by mouth at bedtime.          BP 177/90  Pulse 80  Temp(Src) 98.2 F (36.8 C) (Oral)  Resp 18  SpO2 95% Physical Exam  Nursing note and vitals reviewed. Constitutional: She is oriented to person, place, and time. She appears well-developed and well-nourished. No distress.  HENT:  Head: Normocephalic and atraumatic.  Mouth/Throat: Oropharynx is clear and moist. No oropharyngeal exudate.  Eyes: EOM are normal. Pupils are equal, round, and reactive to light.  Neck: Normal range of motion. Neck supple.  Cardiovascular: Normal rate and regular rhythm.  Exam reveals no gallop and no friction rub.   No murmur heard. Pulmonary/Chest: Effort normal and breath sounds normal. No respiratory distress. She has no wheezes. She has no rales. She exhibits tenderness (chest tenderness is completely reproduced with palpation over the right anterior chest. There is no crepitance or  deformity.).  Abdominal: Soft. Bowel sounds are normal. She exhibits no distension and no mass. There is no tenderness. There is no rebound and no guarding.  Musculoskeletal: Normal range of motion. She exhibits tenderness. She exhibits no edema.  Patient has tenderness to palpation of the musculature of the thoracic back. She has no midline thoracic tenderness. There is no evidence of any trauma.  Neurological: She is alert and oriented to person, place, and time.  Patient is alert and oriented x3 with clear, goal oriented speech. Patient has 5/5 motor in all extremities. Sensation is intact to light touch.  Skin: Skin is warm and dry. No rash noted. No erythema.  Psychiatric:  Mildly anxious    ED Course  Procedures (including critical care time) Labs Review Labs Reviewed  [place], ED   Imaging Review Dg Chest 2 View  03/17/2013   CLINICAL DATA:  Hypertension  EXAM: CHEST  2 VIEW  COMPARISON:  08/21/2012  FINDINGS: Chronic interstitial markings. No focal consolidation. No pleural effusion or pneumothorax.  The heart is normal in size.  Mild degenerative changes of the visualized thoracolumbar spine.  Bilateral breast augmentation.  IMPRESSION: No evidence of acute cardiopulmonary disease.   Electronically Signed   By: Julian Hy M.D.   On: 03/17/2013 00:12     EKG Interpretation   Date/Time:  Friday March 16 2013 23:31:41 EST Ventricular Rate:  73 PR Interval:  162 QRS Duration: 78 QT Interval:  408 QTC Calculation: 449 R Axis:   79 Text Interpretation:  Normal sinus rhythm Normal ECG Confirmed by  Ramonica Grigg  MD, Sophie Tamez ([id number]) on 03/17/2013 12:59:42 AM      MDM   Final diagnoses:  None   Patient's symptoms are easily reproduced with palpation of the musculature of the right chest and right thoracic back. She has no acute findings in her laboratory workup chest x-ray or EKG. Her symptoms have improved after muscle relaxants  and pain medication. Her blood pressure is elevated but does not need acute intervention. I've advised the patient to followup with her primary Dr. early next week to reevaluate her blood pressure. Return precautions have been given patient voiced understanding.     Julianne Rice, MD 03/17/13 [phone]

## 2013-03-17 NOTE — Discharge Instructions (Signed)
Call and make an appointment to followup with your primary Dr. early next week for reevaluation of your blood pressure. He may take your pain medication as well as muscle relaxant for the pain you have in your chest and upper back. Return immediately for any worsening pain, lightheadedness, shortness of breath or for any concerns.  Chest Wall Pain Chest wall pain is pain in or around the bones and muscles of your chest. It may take up to 6 weeks to get better. It may take longer if you must stay physically active in your work and activities.  CAUSES  Chest wall pain may happen on its own. However, it may be caused by:  A viral illness like the flu.  Injury.  Coughing.  Exercise.  Arthritis.  Fibromyalgia.  Shingles. HOME CARE INSTRUCTIONS   Avoid overtiring physical activity. Try not to strain or perform activities that cause pain. This includes any activities using your chest or your abdominal and side muscles, especially if heavy weights are used.  Put ice on the sore area.  Put ice in a plastic bag.  Place a towel between your skin and the bag.  Leave the ice on for 15-20 minutes per hour while awake for the first 2 days.  Only take over-the-counter or prescription medicines for pain, discomfort, or fever as directed by your caregiver. SEEK IMMEDIATE MEDICAL CARE IF:   Your pain increases, or you are very uncomfortable.  You have a fever.  Your chest pain becomes worse.  You have new, unexplained symptoms.  You have nausea or vomiting.  You feel sweaty or lightheaded.  You have a cough with phlegm (sputum), or you cough up blood. MAKE SURE YOU:   Understand these instructions.  Will watch your condition.  Will get help right away if you are not doing well or get worse. Document Released: 01/04/2005 Document Revised: 03/29/2011 Document Reviewed: 08/31/2010 [facility] Patient Information 2014 [place], [place].  Hypertension As your heart beats, it forces  blood through your arteries. This force is your blood pressure. If the pressure is too high, it is called hypertension (HTN) or high blood pressure. HTN is dangerous because you may have it and not know it. High blood pressure may mean that your heart has to work harder to pump blood. Your arteries may be narrow or stiff. The extra work puts you at risk for heart disease, stroke, and other problems.  Blood pressure consists of two numbers, a higher number over a lower, 110/72, for example. It is stated as "110 over 72." The ideal is below 120 for the top number (systolic) and under 80 for the bottom (diastolic). Write down your blood pressure today. You should pay close attention to your blood pressure if you have certain conditions such as:  Heart failure.  Prior heart attack.  Diabetes  Chronic kidney disease.  Prior stroke.  Multiple risk factors for heart disease. To see if you have HTN, your blood pressure should be measured while you are seated with your arm held at the level of the heart. It should be measured at least twice. A one-time elevated blood pressure reading (especially in the Emergency Department) does not mean that you need treatment. There may be conditions in which the blood pressure is different between your right and left arms. It is important to see your caregiver soon for a recheck. Most people have essential hypertension which means that there is not a specific cause. This type of high blood pressure may be  lowered by changing lifestyle factors such as:  Stress.  Smoking.  Lack of exercise.  Excessive weight.  Drug/tobacco/alcohol use.  Eating less salt. Most people do not have symptoms from high blood pressure until it has caused damage to the body. Effective treatment can often prevent, delay or reduce that damage. TREATMENT  When a cause has been identified, treatment for high blood pressure is directed at the cause. There are a large number of medications  to treat HTN. These fall into several categories, and your caregiver will help you select the medicines that are best for you. Medications may have side effects. You should review side effects with your caregiver. If your blood pressure stays high after you have made lifestyle changes or started on medicines,   Your medication(s) may need to be changed.  Other problems may need to be addressed.  Be certain you understand your prescriptions, and know how and when to take your medicine.  Be sure to follow up with your caregiver within the time frame advised (usually within two weeks) to have your blood pressure rechecked and to review your medications.  If you are taking more than one medicine to lower your blood pressure, make sure you know how and at what times they should be taken. Taking two medicines at the same time can result in blood pressure that is too low. SEEK IMMEDIATE MEDICAL CARE IF:  You develop a severe headache, blurred or changing vision, or confusion.  You have unusual weakness or numbness, or a faint feeling.  You have severe chest or abdominal pain, vomiting, or breathing problems. MAKE SURE YOU:   Understand these instructions.  Will watch your condition.  Will get help right away if you are not doing well or get worse. Document Released: 01/04/2005 Document Revised: 03/29/2011 Document Reviewed: 08/25/2007 [employer] Patient Information 2014 [place].  Muscle Strain A muscle strain is an injury that occurs when a muscle is stretched beyond its normal length. Usually a small number of muscle fibers are torn when this happens. Muscle strain is rated in degrees. First-degree strains have the least amount of muscle fiber tearing and pain. Second-degree and third-degree strains have increasingly more tearing and pain.  Usually, recovery from muscle strain takes 1 2 weeks. Complete healing takes 5 6 weeks.  CAUSES  Muscle strain happens when a sudden, violent  force placed on a muscle stretches it too far. This may occur with lifting, sports, or a fall.  RISK FACTORS Muscle strain is especially common in athletes.  SIGNS AND SYMPTOMS At the site of the muscle strain, there may be:  Pain.  Bruising.  Swelling.  Difficulty using the muscle due to pain or lack of normal function. DIAGNOSIS  Your health care provider will perform a physical exam and ask about your medical history. TREATMENT  Often, the best treatment for a muscle strain is resting, icing, and applying cold compresses to the injured area.  HOME CARE INSTRUCTIONS   Use the PRICE method of treatment to promote muscle healing during the first 2 3 days after your injury. The PRICE method involves:  Protecting the muscle from being injured again.  Restricting your activity and resting the injured body part.  Icing your injury. To do this, put ice in a plastic bag. Place a towel between your skin and the bag. Then, apply the ice and leave it on from 15 20 minutes each hour. After the third day, switch to moist heat packs.  Apply compression to  the injured area with a splint or elastic bandage. Be careful not to wrap it too tightly. This may interfere with blood circulation or increase swelling.  Elevate the injured body part above the level of your heart as often as you can.  Only take over-the-counter or prescription medicines for pain, discomfort, or fever as directed by your health care provider.  Warming up prior to exercise helps to prevent future muscle strains. SEEK MEDICAL CARE IF:   You have increasing pain or swelling in the injured area.  You have numbness, tingling, or a significant loss of strength in the injured area. MAKE SURE YOU:   Understand these instructions.  Will watch your condition.  Will get help right away if you are not doing well or get worse. Document Released: 01/04/2005 Document Revised: 10/25/2012 Document Reviewed: 08/03/2012 [facility]  Patient Information 2014 [place], [place].

## 2013-03-22 DIAGNOSIS — N184 Chronic kidney disease, stage 4 (severe): Secondary | ICD-10-CM | POA: Diagnosis not present

## 2013-03-22 DIAGNOSIS — I10 Essential (primary) hypertension: Secondary | ICD-10-CM | POA: Diagnosis not present

## 2013-03-22 DIAGNOSIS — D631 Anemia in chronic kidney disease: Secondary | ICD-10-CM | POA: Diagnosis not present

## 2013-03-22 DIAGNOSIS — N039 Chronic nephritic syndrome with unspecified morphologic changes: Secondary | ICD-10-CM | POA: Diagnosis not present

## 2013-03-22 DIAGNOSIS — N2581 Secondary hyperparathyroidism of renal origin: Secondary | ICD-10-CM | POA: Diagnosis not present

## 2013-03-26 ENCOUNTER — Ambulatory Visit (INDEPENDENT_AMBULATORY_CARE_PROVIDER_SITE_OTHER): Payer: Medicare Other | Admitting: Family Medicine

## 2013-03-26 ENCOUNTER — Encounter: Payer: Self-pay | Admitting: Family Medicine

## 2013-03-26 VITALS — BP 120/82 | HR 88 | Temp 98.7°F | Wt 137.1 lb

## 2013-03-26 DIAGNOSIS — N184 Chronic kidney disease, stage 4 (severe): Secondary | ICD-10-CM | POA: Diagnosis not present

## 2013-03-26 DIAGNOSIS — I10 Essential (primary) hypertension: Secondary | ICD-10-CM

## 2013-03-26 DIAGNOSIS — R079 Chest pain, unspecified: Secondary | ICD-10-CM | POA: Insufficient documentation

## 2013-03-26 LAB — RENAL FUNCTION PANEL
Albumin: 4.2 g/dL (ref 3.5–5.2)
BUN: 40 mg/dL — AB (ref 6–23)
CALCIUM: 9.3 mg/dL (ref 8.4–10.5)
CO2: 23 meq/L (ref 19–32)
CREATININE: 2.1 mg/dL — AB (ref 0.4–1.2)
Chloride: 108 mEq/L (ref 96–112)
GFR: 24.41 mL/min — AB (ref >60.00)
Glucose, Bld: 98 mg/dL (ref 70–99)
Phosphorus: 4.4 mg/dL (ref 2.3–4.6)
Potassium: 5 mEq/L (ref 3.5–5.1)
SODIUM: 139 meq/L (ref 135–145)

## 2013-03-26 NOTE — Progress Notes (Signed)
BP 120/82  Pulse 88  Temp(Src) 98.7 F (37.1 C) (Oral)  Wt 137 lb 1.9 oz (62.197 kg)   CC: ER f/u  Subjective:    Patient ID: Nicole English, female    DOB: 11-08-1932, 80 y.o.   MRN: [id number]  HPI: Nicole English is a 80 y.o. female presenting on 03/26/2013 with Follow-up   Nicole English was seen 2 wks ago at the ER with coccenrs for hypertension and R sided chest and thoracic back pain.  Ischemic workup was unrevealing including cardiac enzymes x1, normal blood work, EKG and CXR.  Symptoms improved with pain medication and muscle relaxant.  She continues to take muscle relaxant robaxin prescribed by ER.  Pain radiation throughout body - from R thoracic back and shoulder to R hip and leg and dull pain in lower back.  No paresthesias.  She was also seen yesterday by Dr. Juleen China who requested BMP drawn today.  He also restarted her nebivolol at 5mg  daily due to uncontrolled hypertension.  She would like to stop seeing Dr. Martinique if she doesn't need to see him.  She has history of mild diastolic dysfunction and has mainly been following with him for her hypertension.  Agreed we can follow her hypertension at my office for now.  Relevant past medical, surgical, family and social history reviewed and updated as indicated.  Allergies and medications reviewed and updated. Current Outpatient Prescriptions on File Prior to Visit  Medication Sig  . acetaminophen (TYLENOL) 500 MG tablet Take 500 mg by mouth 2 (two) times daily as needed for pain.  Marland Kitchen ALPRAZolam (XANAX) 0.25 MG tablet Take 0.25 mg by mouth 2 (two) times daily as needed for anxiety.  Marland Kitchen aspirin EC 81 MG tablet Take 81 mg by mouth daily.  . furosemide (LASIX) 20 MG tablet Take 1 tablet (20 mg total) by mouth 2 (two) times daily.  Marland Kitchen gabapentin (NEURONTIN) 100 MG capsule Take 100 mg by mouth. Take one tablet 2 times daily and 2 tablets at bedtime  . HYDROcodone-acetaminophen (NORCO/VICODIN) 5-325 MG per tablet Take 1 tablet by mouth 2  (two) times daily as needed for moderate pain.   . methocarbamol (ROBAXIN) 500 MG tablet Take 1 tablet (500 mg total) by mouth 2 (two) times daily as needed for muscle spasms.  Marland Kitchen zolpidem (AMBIEN) 5 MG tablet Take 5 mg by mouth at bedtime.   No current facility-administered medications on file prior to visit.    Review of Systems Per HPI unless specifically indicated above    Objective:    BP 120/82  Pulse 88  Temp(Src) 98.7 F (37.1 C) (Oral)  Wt 137 lb 1.9 oz (62.197 kg)  Physical Exam  Nursing note and vitals reviewed. Constitutional: She appears well-developed and well-nourished. No distress.  HENT:  Mouth/Throat: Oropharynx is clear and moist. No oropharyngeal exudate.  Eyes: Conjunctivae and EOM are normal. Pupils are equal, round, and reactive to light. No scleral icterus.  Neck: No hepatojugular reflux and no JVD present.  Cardiovascular: Normal rate, regular rhythm, normal heart sounds and intact distal pulses.   No murmur heard. Pulmonary/Chest: Effort normal and breath sounds normal. No respiratory distress. She has no wheezes. She has no rales.  Musculoskeletal: She exhibits no edema.  Skin: Skin is warm and dry. No rash noted.  Psychiatric: She has a normal mood and affect.   Results for orders placed during the hospital encounter of 03/17/13  CBC      Result Value Ref Range  WBC 6.6  4.0 - 10.5 K/uL   RBC 3.81 (*) 3.87 - 5.11 MIL/uL   Hemoglobin 11.9 (*) 12.0 - 15.0 g/dL   HCT 36.2  36.0 - 46.0 %   MCV 95.0  78.0 - 100.0 fL   MCH 31.2  26.0 - 34.0 pg   MCHC 32.9  30.0 - 36.0 g/dL   RDW 12.7  11.5 - 15.5 %   Platelets 310  150 - 400 K/uL  BASIC METABOLIC PANEL      Result Value Ref Range   Sodium 142  137 - 147 mEq/L   Potassium 4.2  3.7 - 5.3 mEq/L   Chloride 102  96 - 112 mEq/L   CO2 24  19 - 32 mEq/L   Glucose, Bld 102 (*) 70 - 99 mg/dL   BUN 32 (*) 6 - 23 mg/dL   Creatinine, Ser 1.84 (*) 0.50 - 1.10 mg/dL   Calcium 9.3  8.4 - 10.5 mg/dL   GFR  calc non Af Amer 25 (*) >90 mL/min   GFR calc Af Amer 29 (*) >90 mL/min  I-STAT TROPOININ, ED      Result Value Ref Range   Troponin i, poc 0.00  0.00 - 0.08 ng/mL   Comment 3               Assessment & Plan:   Problem List Items Addressed This Visit   Chest pain - Primary     Agree with ER that this is musculoskeletal.  Continue to monitor with intermittent use of muscle relaxant. Discussed possible sedation risk of relaxant as well as increased risk of falls, advised to only use one at a time of narcotic, relaxant, or ambien.    CKD (chronic kidney disease) stage 4, GFR 15-29 ml/min     Appreciate care of Dr. Juleen China. Continue lasix 20mg  bid and now nebivolol 5mg  daily.    Relevant Orders      Renal Function Panel   HTN (hypertension)     Chronic, stable.  Recent addition of nebivolol 5mg  daily has led to better control.  Continue current regimen.    Relevant Medications      nebivolol (BYSTOLIC) 5 MG tablet       Follow up plan: Return in about 4 months (around 07/26/2013), or if symptoms worsen or fail to improve, for medicare wellness visit.

## 2013-03-26 NOTE — Progress Notes (Signed)
Pre visit review using our clinic review tool, if applicable. No additional management support is needed unless otherwise documented below in the visit note. 

## 2013-03-26 NOTE — Assessment & Plan Note (Signed)
Appreciate care of Dr. Juleen China. Continue lasix 20mg  bid and now nebivolol 5mg  daily.

## 2013-03-26 NOTE — Assessment & Plan Note (Signed)
Chronic, stable.  Recent addition of nebivolol 5mg  daily has led to better control.  Continue current regimen.

## 2013-03-26 NOTE — Assessment & Plan Note (Signed)
Agree with ER that this is musculoskeletal.  Continue to monitor with intermittent use of muscle relaxant. Discussed possible sedation risk of relaxant as well as increased risk of falls, advised to only use one at a time of narcotic, relaxant, or ambien.

## 2013-03-26 NOTE — Patient Instructions (Addendum)
I think you are doing well today. Blood work today.  We will fax results to Dr. Juleen China. Return in 3-4 months for medicare wellness exam, prior fasting for blood work.

## 2013-03-27 ENCOUNTER — Encounter: Payer: Self-pay | Admitting: *Deleted

## 2013-03-27 ENCOUNTER — Telehealth: Payer: Self-pay | Admitting: Family Medicine

## 2013-03-27 NOTE — Telephone Encounter (Signed)
Relevant patient education assigned to patient using Emmi. ° °

## 2013-03-29 ENCOUNTER — Telehealth: Payer: Self-pay

## 2013-03-29 NOTE — Telephone Encounter (Signed)
plz fax recent labwork attn Dr. Juleen China to Fax # [phone] with recent office visit and notify pt results were faxed to him

## 2013-03-29 NOTE — Telephone Encounter (Signed)
Pt received letter about recent lab work and pt called to see if creatinine and BUN were kidney related test; advised creatinine and BUN were related to kidney function and pt wanted to know if reference range was the normal range; advised yes. Pt said that was all she needed to know.

## 2013-03-30 ENCOUNTER — Telehealth: Payer: Self-pay | Admitting: *Deleted

## 2013-03-30 NOTE — Telephone Encounter (Signed)
error 

## 2013-03-30 NOTE — Telephone Encounter (Signed)
Notes and labs faxed as directed. Patient notified.

## 2013-04-06 ENCOUNTER — Other Ambulatory Visit: Payer: Self-pay | Admitting: Family Medicine

## 2013-04-06 NOTE — Telephone Encounter (Signed)
Rx called in as directed.   

## 2013-04-06 NOTE — Telephone Encounter (Signed)
plz phone in. 

## 2013-04-10 ENCOUNTER — Other Ambulatory Visit: Payer: Self-pay

## 2013-04-10 NOTE — Telephone Encounter (Signed)
Pt left v/m requesting rx hydrocodone apap; pt would like to pick up on 04/11/13 in the AM.call when ready for pick up.

## 2013-04-11 ENCOUNTER — Other Ambulatory Visit: Payer: Self-pay | Admitting: Family Medicine

## 2013-04-11 MED ORDER — HYDROCODONE-ACETAMINOPHEN 5-325 MG PO TABS: 1.0000 | ORAL_TABLET | Freq: Two times a day (BID) | ORAL | Status: DC | PRN

## 2013-04-11 NOTE — Telephone Encounter (Signed)
plz phone in. 

## 2013-04-11 NOTE — Telephone Encounter (Signed)
Printed and handed to [place].

## 2013-04-11 NOTE — Telephone Encounter (Signed)
Given to [place] at front desk, she will call pt to let her know RX is ready.

## 2013-04-12 NOTE — Telephone Encounter (Signed)
Rx called in as prescribed 

## 2013-04-18 ENCOUNTER — Ambulatory Visit (HOSPITAL_COMMUNITY)
Admission: RE | Admit: 2013-04-18 | Discharge: 2013-04-18 | Disposition: A | Discharge status: Home / Self Care | Payer: Medicare Other | Source: Ambulatory Visit | Attending: Family Medicine | Admitting: Family Medicine

## 2013-04-18 DIAGNOSIS — Z1231 Encounter for screening mammogram for malignant neoplasm of breast: Secondary | ICD-10-CM | POA: Diagnosis not present

## 2013-05-04 ENCOUNTER — Other Ambulatory Visit: Payer: Self-pay | Admitting: Family Medicine

## 2013-05-04 NOTE — Telephone Encounter (Signed)
Pt left v/m requesting the ambien refill done today; pt will be out of med this weekend.

## 2013-05-04 NOTE — Telephone Encounter (Signed)
Rx called in to pharmacy. 

## 2013-05-04 NOTE — Telephone Encounter (Signed)
Please phone in [place]

## 2013-05-14 ENCOUNTER — Other Ambulatory Visit: Payer: Self-pay | Admitting: Family Medicine

## 2013-05-14 NOTE — Telephone Encounter (Signed)
Rx called in as directed.   

## 2013-05-14 NOTE — Telephone Encounter (Signed)
plz phone in. 

## 2013-05-18 ENCOUNTER — Ambulatory Visit: Payer: Medicare Other | Admitting: Cardiology

## 2013-05-24 DIAGNOSIS — I10 Essential (primary) hypertension: Secondary | ICD-10-CM | POA: Diagnosis not present

## 2013-05-24 DIAGNOSIS — D631 Anemia in chronic kidney disease: Secondary | ICD-10-CM | POA: Diagnosis not present

## 2013-05-24 DIAGNOSIS — N184 Chronic kidney disease, stage 4 (severe): Secondary | ICD-10-CM | POA: Diagnosis not present

## 2013-05-24 DIAGNOSIS — N2581 Secondary hyperparathyroidism of renal origin: Secondary | ICD-10-CM | POA: Diagnosis not present

## 2013-05-24 DIAGNOSIS — N039 Chronic nephritic syndrome with unspecified morphologic changes: Secondary | ICD-10-CM | POA: Diagnosis not present

## 2013-05-25 ENCOUNTER — Other Ambulatory Visit: Payer: Self-pay | Admitting: Cardiology

## 2013-05-28 ENCOUNTER — Other Ambulatory Visit: Payer: Self-pay

## 2013-05-28 MED ORDER — HYDROCODONE-ACETAMINOPHEN 5-325 MG PO TABS: 1.0000 | ORAL_TABLET | Freq: Two times a day (BID) | ORAL | Status: DC | PRN

## 2013-05-28 NOTE — Telephone Encounter (Signed)
Printed and palced in Kim's box.

## 2013-05-28 NOTE — Telephone Encounter (Signed)
Patient notified and Rx placed up front for pick up. 

## 2013-05-28 NOTE — Telephone Encounter (Signed)
Pt left v/m requesting rx hydrocodone apap. Call when ready for pick up.  

## 2013-06-06 ENCOUNTER — Other Ambulatory Visit: Payer: Self-pay

## 2013-06-06 MED ORDER — ZOLPIDEM TARTRATE 5 MG PO TABS: ORAL_TABLET | ORAL | Status: DC

## 2013-06-06 NOTE — Telephone Encounter (Signed)
Pt left v/m; pt is out of med and request refill zolpidem to [facility].pt request cb when refilled.

## 2013-06-06 NOTE — Telephone Encounter (Signed)
Dr. Darnell Level is out of the office. Will need to be sent another provider.

## 2013-06-06 NOTE — Telephone Encounter (Signed)
Called to [facility].  Nicole English notified prescription has been called to her pharmacy.

## 2013-06-06 NOTE — Telephone Encounter (Signed)
Px written for call in   

## 2013-06-08 ENCOUNTER — Ambulatory Visit (INDEPENDENT_AMBULATORY_CARE_PROVIDER_SITE_OTHER): Payer: Medicare Other | Admitting: Family Medicine

## 2013-06-08 ENCOUNTER — Telehealth: Payer: Self-pay | Admitting: Family Medicine

## 2013-06-08 ENCOUNTER — Encounter: Payer: Self-pay | Admitting: Family Medicine

## 2013-06-08 VITALS — BP 128/82 | HR 56 | Temp 98.4°F | Wt 134.5 lb

## 2013-06-08 DIAGNOSIS — M25552 Pain in left hip: Principal | ICD-10-CM

## 2013-06-08 DIAGNOSIS — M25559 Pain in unspecified hip: Secondary | ICD-10-CM

## 2013-06-08 DIAGNOSIS — M25551 Pain in right hip: Secondary | ICD-10-CM | POA: Insufficient documentation

## 2013-06-08 NOTE — Progress Notes (Addendum)
BP 128/82  Pulse 56  Temp(Src) 98.4 F (36.9 C) (Oral)  Wt 134 lb 8 oz (61.009 kg)  SpO2 98%   CC: lower extremity pain  Subjective:    Patient ID: Nicole English, female    DOB: 1932/08/19, 80 y.o.   MRN: [id number]  HPI: Nicole English is a 80 y.o. female presenting on 06/08/2013 for Pelvic Pain   1 mo h/o increased difficulty walking - at times has difficulty even going to mailbox.  Describes burning pain in pelvic girdle "feels inflamed". Mainly worse with walking. Endorses paresthesias of lower legs around knees. H/o chronic lower back pain.  Does have history of shoulder pain for which she sees a chiropractor, does feel stiff in the mornings.  Feels energy level is good.  Denies inciting trauma/injury. Denies fevers/chills, new rashes, vag discharge or bleeding. No saddle anesthesia. Appetite ok. No weight changes. No headaches or vision changes. She has been busier than normal the past 6 weeks. Also having L lower leg pain, R knee pain as well (from old injuries).  Reviewed recent eval including labwork by renal (05/24/2013) - Cr 1.7 (improvement from baseline).  WBC WNL. She also did have normal CPK, aldolase, and ANA  Wt Readings from Last 3 Encounters:  06/08/13 134 lb 8 oz (61.009 kg)  03/26/13 137 lb 1.9 oz (62.197 kg)  11/13/12 134 lb 6.4 oz (60.963 kg)  Body mass index is 24.59 kg/(m^2).  Past Medical History  Diagnosis Date  . History of diverticulitis of colon   . HLD (hyperlipidemia)   . Anxiety   . Osteoporosis   . Depression   . GERD (gastroesophageal reflux disease)   . IBS (irritable bowel syndrome)   . Glucose intolerance (impaired glucose tolerance)   . Allergic rhinitis   . Migraine   . HTN (hypertension)     Dr. Martinique, cards  . SUI (stress urinary incontinence, female)     Dr. Amalia Hailey, urology (some urge as well)  . Diverticulosis     colonsocopy 2002 aborted 2/2 tortuous colon and heavy sigmoid diverticulosis  . Dysphagia     EGD 2012 -  wnl, s/p esoph dilation  . CKD (chronic kidney disease) stage 4, GFR 15-29 ml/min 12/2012    [place]  . Diastolic dysfunction     per echo in November 2012; normal EF  . DDD (degenerative disc disease), lumbar   . 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   Marland Kitchen History of CVA (cerebrovascular accident) 2012    old per prior head CT    Past Surgical History  Procedure Laterality Date  . Tonsillectomy  1942  . Submucous resection  1962  . Tubal ligation  1973  . Breast enhancement surgery  1982  . Total abdominal hysterectomy  1996    for frequent dysmennorhea  . Macroplastique implantation  03/2008    Dr. Rogers Blocker  . Uretherolysis and removal of macroplastique  09/13/08    Dr. Amalia Hailey  . Ct head limited w/o cm  04/2010    nothing acute, ? old infarcts L internal capsule  . Renal doppler  2009?    simple cysts, wnl  . Colonoscopy  2002    does not want another!  . Esophagogastroduodenoscopy  2012    WNL, s/p esoph dilation (Dr. Candace Cruise, [facility])  . US echocardiography  11/2010    EF 55-60%, mild LAE, mild diastolic dysfunction, normal valves  Relevant past medical, surgical, family and social history reviewed and updated as indicated.  Allergies and medications reviewed and updated. Current Outpatient Prescriptions on File Prior to Visit  Medication Sig  . acetaminophen (TYLENOL) 500 MG tablet Take 500 mg by mouth 2 (two) times daily as needed for pain.  Marland Kitchen ALPRAZolam (XANAX) 0.25 MG tablet TAKE ONE TABLET BY MOUTH TWICE DAILY AS NEEDED  . aspirin EC 81 MG tablet Take 81 mg by mouth daily.  . furosemide (LASIX) 20 MG tablet TAKE ONE TABLET BY MOUTH TWICE DAILY  . gabapentin (NEURONTIN) 100 MG capsule Take 100 mg by mouth. Take one tablet 2 times daily and 2 tablets at bedtime  . HYDROcodone-acetaminophen (NORCO/VICODIN) 5-325 MG per tablet Take 1 tablet by mouth 2 (two) times daily as needed for moderate pain.  .  nebivolol (BYSTOLIC) 5 MG tablet Take 5 mg by mouth daily.  Marland Kitchen zolpidem (AMBIEN) 5 MG tablet TAKE ONE TABLET BY MOUTH ONCE DAILY AT BEDTIME   No current facility-administered medications on file prior to visit.    Review of Systems Per HPI unless specifically indicated above    Objective:    BP 128/82  Pulse 56  Temp(Src) 98.4 F (36.9 C) (Oral)  Wt 134 lb 8 oz (61.009 kg)  SpO2 98%  Physical Exam  Nursing note and vitals reviewed. Constitutional: She is oriented to person, place, and time. She appears well-developed and well-nourished. No distress.  Musculoskeletal: She exhibits no edema.  No pain midline spine Mild lumbar paraspinous mm tenderness Neg SLR bilaterally. No pain with int/ext rotation at hip. Neg FABER. No pain at bilateral sciatic notch. Tender L>R GTB, L SIJ. Tender to palpation along shoulder girdle as well as hip girdle  Neurological: She is alert and oriented to person, place, and time. She has normal strength. No sensory deficit. Coordination and gait normal.  5/5 strength BLE including hip abductors/adductors   Results for orders placed in visit on 03/26/13  RENAL FUNCTION PANEL      Result Value Ref Range   Sodium 139  135 - 145 mEq/L   Potassium 5.0  3.5 - 5.1 mEq/L   Chloride 108  96 - 112 mEq/L   CO2 23  19 - 32 mEq/L   Calcium 9.3  8.4 - 10.5 mg/dL   Albumin 4.2  3.5 - 5.2 g/dL   BUN 40 (*) 6 - 23 mg/dL   Creatinine, Ser 2.1 (*) 0.4 - 1.2 mg/dL   Glucose, Bld 98  70 - 99 mg/dL   Phosphorus 4.4  2.3 - 4.6 mg/dL   GFR 24.41 (*) >60.00 mL/min      Assessment & Plan:   Problem List Items Addressed This Visit   Hip pain, bilateral - Primary     Actually, pt describes some concerning sxs of PMR - so will check ESR today. Will call with results and plan. If elevated, would start prednisone. In interim, anticipate she does have component of greater trochanteric bursitis - so will treat with stretching exercises from SM pt advisor as well as  discussed sitting crossover stetches. Avoid NSAIDs in h/o CKD stage 3-4. Continue tylenol and vicodin for pain.    Relevant Orders      Sedimentation rate       Follow up plan: Return if symptoms worsen or fail to improve.

## 2013-06-08 NOTE — Telephone Encounter (Signed)
Patient Information:  [name] Name: Nicole English  Phone: [phone]  Patient: Nicole, English  Gender: Female  DOB: [birth year]  Age: 80 Years  PCP: Ria Bush [facility])  Office Follow Up:  Does the office need to follow up with this patient?: Yes  Instructions For The Office: Pls see RN note.  RN Note:  F/U Lab work from [employer].  Pt had labs drawn at [facility] on 5-7, Pt was advised to f/u w/ Dr Danise [name] d/t elevated lab levels indicating inflammation per Pt.   [facility] informed Pt they sent Labs to PCP to f/u.  Pt complains of Pelvic pain, becoming difficult to walk.  Pt takes Norco 5/325, improves pain but Pt would like to know what she can do for inflammation/burning type sensation.  Per EPIC, no labs noted from 5-7. All emergent sxs ruled out per Leg Pain protocol, go to office d/t severe pain unable to do normal activities.  Appt offered, Pt would like to have note sent to Dr Danise [name] and f/u call placed back to her.  Advised Pt to call Nephrologist to have lab refaxed to office d/t results not noted in Chart.  Please review Pt's request w/ MD and f/u w/ Pt.  Symptoms  Reason For Call & Symptoms: F/U Lab work from [place] History In EMR: N/A  Reviewed Medications In EMR: N/A  Reviewed Allergies In EMR: N/A  Reviewed Surgeries / Procedures: N/A  Date of Onset of Symptoms: 06/08/2013  Guideline(s) Used:  Leg Pain  Disposition Per Guideline:   Go to Office Now  Reason For Disposition Reached:   Severe pain (e.g., excruciating, unable to do any normal activities)  Advice Given:  N/A  Patient Will Follow Care Advice:  YES

## 2013-06-08 NOTE — Assessment & Plan Note (Addendum)
Actually, pt describes some concerning sxs of PMR - so will check ESR today. Will call with results and plan. If elevated, would start prednisone. In interim, anticipate she does have component of greater trochanteric bursitis - so will treat with stretching exercises from SM pt advisor as well as discussed sitting crossover stetches. Avoid NSAIDs in h/o CKD stage 3-4. Continue tylenol and vicodin for pain.

## 2013-06-08 NOTE — Telephone Encounter (Signed)
Patient notified and will keep appt at 4:15 today.

## 2013-06-08 NOTE — Telephone Encounter (Signed)
Patient said she saw kidney doctor in May and had labs. Records in your IN box. I scheduled patient an appt for today just incase you felt she needed to be seen, but she said she really didn't want to come in if it could be avoided. I told her I would call her back and let her know.

## 2013-06-08 NOTE — Progress Notes (Signed)
Pre visit review using our clinic review tool, if applicable. No additional management support is needed unless otherwise documented below in the visit note. 

## 2013-06-08 NOTE — Patient Instructions (Addendum)
Blood test today to check for polymyalgia rheumatica. In interim, may use tylenol and pain medication.  Do stretching exercises discussed and those provided. Heating pad to outside hips. We will call you with blood work and plan.

## 2013-06-08 NOTE — Telephone Encounter (Signed)
Pt saw nephrologist 03/22/2013 and was given script to have BMP drawn.  Was this done?

## 2013-06-08 NOTE — Telephone Encounter (Signed)
There is nothing in labwork indicating inflammation. Pt will need evaluation if she's not feeling well.

## 2013-06-09 LAB — SEDIMENTATION RATE: SED RATE: 7 mm/h (ref 0–22)

## 2013-06-11 ENCOUNTER — Other Ambulatory Visit: Payer: Self-pay | Admitting: Family Medicine

## 2013-06-11 NOTE — Telephone Encounter (Signed)
plz phone in. 

## 2013-06-12 ENCOUNTER — Other Ambulatory Visit: Payer: Self-pay | Admitting: Family Medicine

## 2013-06-12 NOTE — Telephone Encounter (Signed)
Rx called to pharmacy as instructed. 

## 2013-06-27 DIAGNOSIS — H04129 Dry eye syndrome of unspecified lacrimal gland: Secondary | ICD-10-CM | POA: Diagnosis not present

## 2013-06-27 DIAGNOSIS — H1045 Other chronic allergic conjunctivitis: Secondary | ICD-10-CM | POA: Diagnosis not present

## 2013-06-27 DIAGNOSIS — H16219 Exposure keratoconjunctivitis, unspecified eye: Secondary | ICD-10-CM | POA: Diagnosis not present

## 2013-06-27 DIAGNOSIS — Z961 Presence of intraocular lens: Secondary | ICD-10-CM | POA: Diagnosis not present

## 2013-07-09 ENCOUNTER — Other Ambulatory Visit: Payer: Self-pay

## 2013-07-09 MED ORDER — ZOLPIDEM TARTRATE 5 MG PO TABS: ORAL_TABLET | ORAL | Status: DC

## 2013-07-09 MED ORDER — HYDROCODONE-ACETAMINOPHEN 5-325 MG PO TABS: 1.0000 | ORAL_TABLET | Freq: Two times a day (BID) | ORAL | Status: DC | PRN

## 2013-07-09 NOTE — Telephone Encounter (Signed)
Per Carrie-patient is aware.

## 2013-07-09 NOTE — Telephone Encounter (Signed)
Printed and placed in Nicole English's box. Actually handed to [name].

## 2013-07-09 NOTE — Telephone Encounter (Signed)
Pt left /vm requesting rx hydrocodone apap. Call when ready for pick up. Pt said she needs to pick up rx for hydrocodone today before 2 pm.pt also request zolpidem sent to [place].Please advise.

## 2013-07-14 ENCOUNTER — Other Ambulatory Visit: Payer: Self-pay | Admitting: Family Medicine

## 2013-07-15 NOTE — Telephone Encounter (Signed)
plz phone in. 

## 2013-07-16 NOTE — Telephone Encounter (Signed)
Rx called in as directed.   

## 2013-07-27 DIAGNOSIS — N184 Chronic kidney disease, stage 4 (severe): Secondary | ICD-10-CM | POA: Diagnosis not present

## 2013-07-27 DIAGNOSIS — I10 Essential (primary) hypertension: Secondary | ICD-10-CM | POA: Diagnosis not present

## 2013-07-27 DIAGNOSIS — N2581 Secondary hyperparathyroidism of renal origin: Secondary | ICD-10-CM | POA: Diagnosis not present

## 2013-07-27 DIAGNOSIS — N039 Chronic nephritic syndrome with unspecified morphologic changes: Secondary | ICD-10-CM | POA: Diagnosis not present

## 2013-07-27 DIAGNOSIS — D631 Anemia in chronic kidney disease: Secondary | ICD-10-CM | POA: Diagnosis not present

## 2013-07-29 ENCOUNTER — Other Ambulatory Visit: Payer: Self-pay | Admitting: Family Medicine

## 2013-07-29 DIAGNOSIS — N184 Chronic kidney disease, stage 4 (severe): Secondary | ICD-10-CM

## 2013-07-29 DIAGNOSIS — E559 Vitamin D deficiency, unspecified: Secondary | ICD-10-CM

## 2013-07-29 DIAGNOSIS — I10 Essential (primary) hypertension: Secondary | ICD-10-CM

## 2013-07-29 DIAGNOSIS — D649 Anemia, unspecified: Secondary | ICD-10-CM

## 2013-07-29 DIAGNOSIS — E785 Hyperlipidemia, unspecified: Secondary | ICD-10-CM

## 2013-07-30 ENCOUNTER — Encounter: Payer: Self-pay | Admitting: Family Medicine

## 2013-07-30 ENCOUNTER — Other Ambulatory Visit (INDEPENDENT_AMBULATORY_CARE_PROVIDER_SITE_OTHER): Payer: Medicare Other

## 2013-07-30 DIAGNOSIS — E559 Vitamin D deficiency, unspecified: Secondary | ICD-10-CM | POA: Diagnosis not present

## 2013-07-30 DIAGNOSIS — E785 Hyperlipidemia, unspecified: Secondary | ICD-10-CM

## 2013-07-30 DIAGNOSIS — N184 Chronic kidney disease, stage 4 (severe): Secondary | ICD-10-CM | POA: Diagnosis not present

## 2013-07-30 DIAGNOSIS — D649 Anemia, unspecified: Secondary | ICD-10-CM | POA: Diagnosis not present

## 2013-07-30 DIAGNOSIS — I10 Essential (primary) hypertension: Secondary | ICD-10-CM

## 2013-07-30 LAB — CBC WITH DIFFERENTIAL/PLATELET
BASOS PCT: 0.4 % (ref 0.0–3.0)
Basophils Absolute: 0 10*3/uL (ref 0.0–0.1)
EOS ABS: 0.5 10*3/uL (ref 0.0–0.7)
Eosinophils Relative: 8.1 % — ABNORMAL HIGH (ref 0.0–5.0)
HEMATOCRIT: 35.8 % — AB (ref 36.0–46.0)
HEMOGLOBIN: 11.6 g/dL — AB (ref 12.0–15.0)
Lymphocytes Relative: 31 % (ref 12.0–46.0)
Lymphs Abs: 1.9 10*3/uL (ref 0.7–4.0)
MCHC: 32.4 g/dL (ref 30.0–36.0)
MCV: 94.3 fl (ref 78.0–100.0)
Monocytes Absolute: 0.6 10*3/uL (ref 0.1–1.0)
Monocytes Relative: 9.8 % (ref 3.0–12.0)
Neutro Abs: 3.2 10*3/uL (ref 1.4–7.7)
Neutrophils Relative %: 50.7 % (ref 43.0–77.0)
PLATELETS: 289 10*3/uL (ref 150.0–400.0)
RBC: 3.79 Mil/uL — ABNORMAL LOW (ref 3.87–5.11)
RDW: 14.1 % (ref 11.5–15.5)
WBC: 6.2 10*3/uL (ref 4.0–10.5)

## 2013-07-30 LAB — COMPREHENSIVE METABOLIC PANEL
ALT: 11 U/L (ref 0–35)
AST: 17 U/L (ref 0–37)
Albumin: 3.9 g/dL (ref 3.5–5.2)
Alkaline Phosphatase: 70 U/L (ref 39–117)
BILIRUBIN TOTAL: 0.5 mg/dL (ref 0.2–1.2)
BUN: 50 mg/dL — AB (ref 6–23)
CO2: 23 meq/L (ref 19–32)
CREATININE: 2.3 mg/dL — AB (ref 0.4–1.2)
Calcium: 9.1 mg/dL (ref 8.4–10.5)
Chloride: 108 mEq/L (ref 96–112)
GFR: 21.5 mL/min — AB (ref >60.00)
Glucose, Bld: 96 mg/dL (ref 70–99)
Potassium: 5.3 mEq/L — ABNORMAL HIGH (ref 3.5–5.1)
Sodium: 139 mEq/L (ref 135–145)
Total Protein: 6.8 g/dL (ref 6.0–8.3)

## 2013-07-30 LAB — LIPID PANEL
Cholesterol: 174 mg/dL (ref 0–200)
HDL: 62.3 mg/dL (ref >39.00)
LDL Cholesterol: 93 mg/dL (ref 0–99)
NonHDL: 111.7
TRIGLYCERIDES: 96 mg/dL (ref 0.0–149.0)
Total CHOL/HDL Ratio: 3
VLDL: 19.2 mg/dL (ref 0.0–40.0)

## 2013-07-31 ENCOUNTER — Other Ambulatory Visit: Payer: Self-pay | Admitting: *Deleted

## 2013-07-31 LAB — VITAMIN D 25 HYDROXY (VIT D DEFICIENCY, FRACTURES): VITD: 50.62 ng/mL

## 2013-07-31 MED ORDER — GABAPENTIN 100 MG PO CAPS: ORAL_CAPSULE | ORAL | Status: DC

## 2013-08-09 ENCOUNTER — Encounter: Payer: Self-pay | Admitting: Family Medicine

## 2013-08-09 ENCOUNTER — Ambulatory Visit (INDEPENDENT_AMBULATORY_CARE_PROVIDER_SITE_OTHER): Payer: Medicare Other | Admitting: Family Medicine

## 2013-08-09 VITALS — BP 116/60 | HR 56 | Temp 97.8°F | Ht 62.0 in | Wt 131.2 lb

## 2013-08-09 DIAGNOSIS — Z1211 Encounter for screening for malignant neoplasm of colon: Secondary | ICD-10-CM

## 2013-08-09 DIAGNOSIS — D631 Anemia in chronic kidney disease: Secondary | ICD-10-CM | POA: Diagnosis not present

## 2013-08-09 DIAGNOSIS — Z23 Encounter for immunization: Secondary | ICD-10-CM | POA: Diagnosis not present

## 2013-08-09 DIAGNOSIS — E785 Hyperlipidemia, unspecified: Secondary | ICD-10-CM

## 2013-08-09 DIAGNOSIS — E875 Hyperkalemia: Secondary | ICD-10-CM | POA: Diagnosis not present

## 2013-08-09 DIAGNOSIS — I10 Essential (primary) hypertension: Secondary | ICD-10-CM

## 2013-08-09 DIAGNOSIS — N039 Chronic nephritic syndrome with unspecified morphologic changes: Secondary | ICD-10-CM

## 2013-08-09 DIAGNOSIS — F32A Depression, unspecified: Secondary | ICD-10-CM

## 2013-08-09 DIAGNOSIS — M810 Age-related osteoporosis without current pathological fracture: Secondary | ICD-10-CM

## 2013-08-09 DIAGNOSIS — N189 Chronic kidney disease, unspecified: Secondary | ICD-10-CM

## 2013-08-09 DIAGNOSIS — F3289 Other specified depressive episodes: Secondary | ICD-10-CM

## 2013-08-09 DIAGNOSIS — Z0000 Encounter for general adult medical examination without abnormal findings: Secondary | ICD-10-CM

## 2013-08-09 DIAGNOSIS — F329 Major depressive disorder, single episode, unspecified: Secondary | ICD-10-CM

## 2013-08-09 DIAGNOSIS — N184 Chronic kidney disease, stage 4 (severe): Secondary | ICD-10-CM

## 2013-08-09 LAB — POTASSIUM: POTASSIUM: 4.9 meq/L (ref 3.5–5.1)

## 2013-08-09 MED ORDER — FUROSEMIDE 20 MG PO TABS: ORAL_TABLET | ORAL | Status: DC

## 2013-08-09 MED ORDER — HYDROCODONE-ACETAMINOPHEN 5-325 MG PO TABS: 1.0000 | ORAL_TABLET | Freq: Two times a day (BID) | ORAL | Status: DC | PRN

## 2013-08-09 NOTE — Assessment & Plan Note (Signed)
Stable off meds. ?

## 2013-08-09 NOTE — Assessment & Plan Note (Signed)
Stable per last few checks. Followed closely by renal Dr. Juleen China, appreciate care of patient.Nicole English today.

## 2013-08-09 NOTE — Assessment & Plan Note (Signed)
Chronic, stable. Check K today - if remaining elevated, increase lasix for next few days.

## 2013-08-09 NOTE — Progress Notes (Signed)
BP 116/60  Pulse 56  Temp(Src) 97.8 F (36.6 C) (Oral)  Ht 5\' 2"  (1.575 m)  Wt 131 lb 4 oz (59.535 kg)  BMI 24.00 kg/m2  SpO2 97%   CC: medicare wellness visit  Subjective:    Patient ID: Nicole English, female    DOB: 1932-05-03, 80 y.o.   MRN: [id number]  HPI: Nicole English is a 80 y.o. female presenting on 08/09/2013 for Annual Exam   CKD stage 4 - followed by Dr. Juleen China, on renal diet. Next appt sept 2015. Now on calcitriol MWF.  Passes hearing screen today.  Recent eye exam so screen not done today. 1 fall - tripped over tree branch, no injury.  Denies depression/anhedonia/sadness.   Preventative: COLONOSCOPY Date: 2002 does not want another! Mammogram - 04/2013 [facility] 1 Well woman:  dexa - ordered 10/2012 but never done. Requests we reorder this. Flu 10/2012 Pneumovax 05/2007, prevnar today Tdap 11/2010 Zostavax 06/2009 Advanced directives: needs to set up. Packet provided. Has 3 sons locally  Lives alone Has family nearby (3 sons locally) - cares for granddaughter [name]) occasionally Occupation: retired, was Holiday representative, stays involved with Environmental [employer] Activity: walking regularly Diet: good water, some vegetables, follows renal diet.  Relevant past medical, surgical, family and social history reviewed and updated as indicated.  Allergies and medications reviewed and updated. Current Outpatient Prescriptions on File Prior to Visit  Medication Sig  . acetaminophen (TYLENOL) 500 MG tablet Take 500 mg by mouth 2 (two) times daily as needed for pain.  Marland Kitchen ALPRAZolam (XANAX) 0.25 MG tablet TAKE ONE TABLET BY MOUTH TWICE DAILY AS NEEDED  . aspirin EC 81 MG tablet Take 81 mg by mouth daily.  Marland Kitchen gabapentin (NEURONTIN) 100 MG capsule Take one tablet 2 times daily and 2 tablets at bedtime  . nebivolol (BYSTOLIC) 5 MG tablet Take 5 mg by mouth daily.  Marland Kitchen zolpidem (AMBIEN) 5 MG tablet TAKE ONE TABLET BY MOUTH ONCE DAILY AT BEDTIME   No current facility-administered  medications on file prior to visit.    Review of Systems Per HPI unless specifically indicated above    Objective:    BP 116/60  Pulse 56  Temp(Src) 97.8 F (36.6 C) (Oral)  Ht 5\' 2"  (1.575 m)  Wt 131 lb 4 oz (59.535 kg)  BMI 24.00 kg/m2  SpO2 97%  Physical Exam  Nursing note and vitals reviewed. Constitutional: She is oriented to person, place, and time. She appears well-developed and well-nourished. No distress.  HENT:  Head: Normocephalic and atraumatic.  Right Ear: Hearing, tympanic membrane, external ear and ear canal normal.  Left Ear: Hearing, tympanic membrane, external ear and ear canal normal.  Nose: Nose normal.  Mouth/Throat: Uvula is midline, oropharynx is clear and moist and mucous membranes are normal. No oropharyngeal exudate, posterior oropharyngeal edema or posterior oropharyngeal erythema.  Eyes: Conjunctivae and EOM are normal. Pupils are equal, round, and reactive to light. No scleral icterus.  Neck: Normal range of motion. Neck supple. Carotid bruit is not present. No thyromegaly present.  Cardiovascular: Normal rate, regular rhythm, normal heart sounds and intact distal pulses.   No murmur heard. Pulses:      Radial pulses are 2+ on the right side, and 2+ on the left side.  Pulmonary/Chest: Effort normal and breath sounds normal. No respiratory distress. She has no wheezes. She has no rales.  Abdominal: Soft. Bowel sounds are normal. She exhibits no distension and no mass. There is no tenderness. There is no  rebound and no guarding.  Musculoskeletal: Normal range of motion. She exhibits no edema.  Lymphadenopathy:    She has no cervical adenopathy.  Neurological: She is alert and oriented to person, place, and time.  CN grossly intact, station and gait intact Recall 3/3 Calculation 5/5 serial 3s  Skin: Skin is warm and dry. No rash noted.  Psychiatric: She has a normal mood and affect. Her behavior is normal. Judgment and thought content normal.    Results for orders placed in visit on 07/30/13  LIPID PANEL      Result Value Ref Range   Cholesterol 174  0 - 200 mg/dL   Triglycerides 96.0  0.0 - 149.0 mg/dL   HDL 62.30  >39.00 mg/dL   VLDL 19.2  0.0 - 40.0 mg/dL   LDL Cholesterol 93  0 - 99 mg/dL   Total CHOL/HDL Ratio 3     NonHDL 111.70    COMPREHENSIVE METABOLIC PANEL      Result Value Ref Range   Sodium 139  135 - 145 mEq/L   Potassium 5.3 (*) 3.5 - 5.1 mEq/L   Chloride 108  96 - 112 mEq/L   CO2 23  19 - 32 mEq/L   Glucose, Bld 96  70 - 99 mg/dL   BUN 50 (*) 6 - 23 mg/dL   Creatinine, Ser 2.3 (*) 0.4 - 1.2 mg/dL   Total Bilirubin 0.5  0.2 - 1.2 mg/dL   Alkaline Phosphatase 70  39 - 117 U/L   AST 17  0 - 37 U/L   ALT 11  0 - 35 U/L   Total Protein 6.8  6.0 - 8.3 g/dL   Albumin 3.9  3.5 - 5.2 g/dL   Calcium 9.1  8.4 - 10.5 mg/dL   GFR 21.50 (*) >60.00 mL/min  CBC WITH DIFFERENTIAL      Result Value Ref Range   WBC 6.2  4.0 - 10.5 K/uL   RBC 3.79 (*) 3.87 - 5.11 Mil/uL   Hemoglobin 11.6 (*) 12.0 - 15.0 g/dL   HCT 35.8 (*) 36.0 - 46.0 %   MCV 94.3  78.0 - 100.0 fl   MCHC 32.4  30.0 - 36.0 g/dL   RDW 14.1  11.5 - 15.5 %   Platelets 289.0  150.0 - 400.0 K/uL   Neutrophils Relative % 50.7  43.0 - 77.0 %   Lymphocytes Relative 31.0  12.0 - 46.0 %   Monocytes Relative 9.8  3.0 - 12.0 %   Eosinophils Relative 8.1 (*) 0.0 - 5.0 %   Basophils Relative 0.4  0.0 - 3.0 %   Neutro Abs 3.2  1.4 - 7.7 K/uL   Lymphs Abs 1.9  0.7 - 4.0 K/uL   Monocytes Absolute 0.6  0.1 - 1.0 K/uL   Eosinophils Absolute 0.5  0.0 - 0.7 K/uL   Basophils Absolute 0.0  0.0 - 0.1 K/uL  VITAMIN D 25 HYDROXY      Result Value Ref Range   VITD 50.62        Assessment & Plan:   Problem List Items Addressed This Visit   OSTEOPOROSIS     Recheck DEXA> pt agrees. This was ordered 10/2012 but never completed. Off sea calcium. Vit D at range.    Relevant Orders      DG Bone Density   Medicare annual wellness visit, subsequent - Primary     I  have personally reviewed the Medicare Annual Wellness questionnaire and have noted 1. The patient's medical  and social history 2. Their use of alcohol, tobacco or illicit drugs 3. Their current medications and supplements 4. The patient's functional ability including ADL's, fall risks, home safety risks and hearing or visual impairment. 5. Diet and physical activity 6. Evidence for depression or mood disorders The patients weight, height, BMI have been recorded in the chart.  Hearing and vision has been addressed. I have made referrals, counseling and provided education to the patient based review of the above and I have provided the pt with a written personalized care plan for preventive services. Provider list updated - see scanned questionairre. Advanced directives discussed: does not have set up - packet of info provided today. Unsure which son would be HCPOA.  Reviewed preventative protocols and updated unless pt declined.    HTN (hypertension)     Chronic, stable. Check K today - if remaining elevated, increase lasix for next few days.    Relevant Medications      furosemide (LASIX) tablet   HLD (hyperlipidemia)     Stable off meds.    Relevant Medications      furosemide (LASIX) tablet   Depression     Stable off meds. ?full remission    CKD (chronic kidney disease) stage 4, GFR 15-29 ml/min     Stable per last few checks. Followed closely by renal Dr. Juleen China, appreciate care of patient.Edmonia Lynch K today.    Anemia in chronic kidney disease     Stable. Goal Hgb >10.     Other Visit Diagnoses   Special screening for malignant neoplasms, colon        Relevant Orders       Fecal occult blood, imunochemical    Hyperkalemia        Relevant Orders       Potassium        Follow up plan: Return in about 6 months (around 02/09/2014), or as needed, for follow up visit.

## 2013-08-09 NOTE — Assessment & Plan Note (Signed)
Stable off meds. ?full remission

## 2013-08-09 NOTE — Assessment & Plan Note (Signed)
Recheck DEXA> pt agrees. This was ordered 10/2012 but never completed. Off sea calcium. Vit D at range.

## 2013-08-09 NOTE — Patient Instructions (Addendum)
prevnar today. Stool kit today. Recheck potassium level today. Packet on advanced directives provided today. I've refilled lasix. Schedule appointment for follow up with Dr. Martinique over next few months at your convenience. We will call you to schedule bone denisty scan. Good to see you today, call us with questions.

## 2013-08-09 NOTE — Assessment & Plan Note (Signed)
I have personally reviewed the Medicare Annual Wellness questionnaire and have noted 1. The patient's medical and social history 2. Their use of alcohol, tobacco or illicit drugs 3. Their current medications and supplements 4. The patient's functional ability including ADL's, fall risks, home safety risks and hearing or visual impairment. 5. Diet and physical activity 6. Evidence for depression or mood disorders The patients weight, height, BMI have been recorded in the chart.  Hearing and vision has been addressed. I have made referrals, counseling and provided education to the patient based review of the above and I have provided the pt with a written personalized care plan for preventive services. Provider list updated - see scanned questionairre. Advanced directives discussed: does not have set up - packet of info provided today. Unsure which son would be HCPOA.  Reviewed preventative protocols and updated unless pt declined.

## 2013-08-09 NOTE — Progress Notes (Signed)
Pre visit review using our clinic review tool, if applicable. No additional management support is needed unless otherwise documented below in the visit note. 

## 2013-08-09 NOTE — Assessment & Plan Note (Signed)
Stable. Goal Hgb >10.

## 2013-08-09 NOTE — Addendum Note (Signed)
Addended by: Royann Shivers A on: 08/09/2013 12:41 PM   Modules accepted: Orders

## 2013-08-10 ENCOUNTER — Encounter: Payer: Self-pay | Admitting: *Deleted

## 2013-08-10 ENCOUNTER — Other Ambulatory Visit: Payer: Self-pay | Admitting: Family Medicine

## 2013-08-11 NOTE — Telephone Encounter (Signed)
plz phone in. 

## 2013-08-13 ENCOUNTER — Other Ambulatory Visit: Payer: Self-pay | Admitting: Family Medicine

## 2013-08-13 NOTE — Telephone Encounter (Signed)
plz phone in if not already done. 

## 2013-08-13 NOTE — Telephone Encounter (Signed)
Rx's called in as directed.  

## 2013-08-13 NOTE — Telephone Encounter (Signed)
Rx called in as directed.   

## 2013-08-18 DIAGNOSIS — M810 Age-related osteoporosis without current pathological fracture: Secondary | ICD-10-CM

## 2013-08-18 HISTORY — DX: Age-related osteoporosis without current pathological fracture: M81.0

## 2013-08-18 HISTORY — PX: OTHER SURGICAL HISTORY: SHX169

## 2013-08-27 ENCOUNTER — Other Ambulatory Visit: Payer: Medicare Other

## 2013-08-27 DIAGNOSIS — Z1211 Encounter for screening for malignant neoplasm of colon: Secondary | ICD-10-CM

## 2013-08-27 LAB — FECAL OCCULT BLOOD, GUAIAC: Fecal Occult Blood: NEGATIVE

## 2013-08-27 LAB — FECAL OCCULT BLOOD, IMMUNOCHEMICAL: Fecal Occult Bld: NEGATIVE

## 2013-08-28 ENCOUNTER — Encounter: Payer: Self-pay | Admitting: *Deleted

## 2013-09-03 ENCOUNTER — Telehealth: Payer: Self-pay | Admitting: Family Medicine

## 2013-09-03 NOTE — Telephone Encounter (Signed)
Pt is a long time member [employer] and they would like her to participate in a research study with [employer]. Would there be any reason pt should not participate? Pt will be out of her house all day but ok to leave message. Please advise.

## 2013-09-03 NOTE — Telephone Encounter (Signed)
Message left advising patient.  

## 2013-09-03 NOTE — Telephone Encounter (Signed)
Ok to participate in this study She should let whoever does intake for study that she has stable chronic kidney disease stage 4

## 2013-09-07 ENCOUNTER — Other Ambulatory Visit: Payer: Self-pay

## 2013-09-07 MED ORDER — HYDROCODONE-ACETAMINOPHEN 5-325 MG PO TABS: 1.0000 | ORAL_TABLET | Freq: Two times a day (BID) | ORAL | Status: DC | PRN

## 2013-09-07 NOTE — Telephone Encounter (Signed)
Pt left v/m requesting rx hydrocodone apap. Call when ready for pick up.  

## 2013-09-07 NOTE — Telephone Encounter (Signed)
Message left notifying patient. Rx placed up front for pick up. 

## 2013-09-07 NOTE — Telephone Encounter (Signed)
Printed and placed in [employer]' box.

## 2013-09-12 ENCOUNTER — Other Ambulatory Visit: Payer: Self-pay | Admitting: Family Medicine

## 2013-09-12 ENCOUNTER — Ambulatory Visit (INDEPENDENT_AMBULATORY_CARE_PROVIDER_SITE_OTHER)
Admission: RE | Admit: 2013-09-12 | Discharge: 2013-09-12 | Disposition: A | Discharge status: Home / Self Care | Payer: Medicare Other | Source: Ambulatory Visit | Attending: Family Medicine | Admitting: Family Medicine

## 2013-09-12 DIAGNOSIS — M810 Age-related osteoporosis without current pathological fracture: Secondary | ICD-10-CM

## 2013-09-12 NOTE — Telephone Encounter (Signed)
Ok to refill 

## 2013-09-13 ENCOUNTER — Encounter (HOSPITAL_COMMUNITY): Payer: Self-pay | Admitting: Emergency Medicine

## 2013-09-13 ENCOUNTER — Emergency Department (HOSPITAL_COMMUNITY): Payer: Medicare Other

## 2013-09-13 ENCOUNTER — Emergency Department (HOSPITAL_COMMUNITY)
Admission: EM | Admit: 2013-09-13 | Discharge: 2013-09-13 | Disposition: A | Discharge status: Home / Self Care | Payer: Medicare Other | Attending: Emergency Medicine | Admitting: Emergency Medicine

## 2013-09-13 DIAGNOSIS — Z8673 Personal history of transient ischemic attack (TIA), and cerebral infarction without residual deficits: Secondary | ICD-10-CM | POA: Diagnosis not present

## 2013-09-13 DIAGNOSIS — G4700 Insomnia, unspecified: Secondary | ICD-10-CM

## 2013-09-13 DIAGNOSIS — Z79899 Other long term (current) drug therapy: Secondary | ICD-10-CM | POA: Diagnosis not present

## 2013-09-13 DIAGNOSIS — R5381 Other malaise: Secondary | ICD-10-CM | POA: Diagnosis not present

## 2013-09-13 DIAGNOSIS — I129 Hypertensive chronic kidney disease with stage 1 through stage 4 chronic kidney disease, or unspecified chronic kidney disease: Secondary | ICD-10-CM | POA: Diagnosis not present

## 2013-09-13 DIAGNOSIS — J449 Chronic obstructive pulmonary disease, unspecified: Secondary | ICD-10-CM | POA: Diagnosis not present

## 2013-09-13 DIAGNOSIS — F411 Generalized anxiety disorder: Secondary | ICD-10-CM | POA: Insufficient documentation

## 2013-09-13 DIAGNOSIS — Z8739 Personal history of other diseases of the musculoskeletal system and connective tissue: Secondary | ICD-10-CM | POA: Diagnosis not present

## 2013-09-13 DIAGNOSIS — R079 Chest pain, unspecified: Secondary | ICD-10-CM | POA: Insufficient documentation

## 2013-09-13 DIAGNOSIS — Z7982 Long term (current) use of aspirin: Secondary | ICD-10-CM | POA: Insufficient documentation

## 2013-09-13 DIAGNOSIS — G43909 Migraine, unspecified, not intractable, without status migrainosus: Secondary | ICD-10-CM | POA: Diagnosis not present

## 2013-09-13 DIAGNOSIS — R059 Cough, unspecified: Secondary | ICD-10-CM | POA: Diagnosis not present

## 2013-09-13 DIAGNOSIS — R05 Cough: Secondary | ICD-10-CM | POA: Diagnosis not present

## 2013-09-13 DIAGNOSIS — N184 Chronic kidney disease, stage 4 (severe): Secondary | ICD-10-CM | POA: Diagnosis not present

## 2013-09-13 DIAGNOSIS — J4489 Other specified chronic obstructive pulmonary disease: Secondary | ICD-10-CM | POA: Insufficient documentation

## 2013-09-13 DIAGNOSIS — Z8719 Personal history of other diseases of the digestive system: Secondary | ICD-10-CM | POA: Insufficient documentation

## 2013-09-13 DIAGNOSIS — Z87891 Personal history of nicotine dependence: Secondary | ICD-10-CM | POA: Diagnosis not present

## 2013-09-13 DIAGNOSIS — R5383 Other fatigue: Secondary | ICD-10-CM | POA: Diagnosis not present

## 2013-09-13 DIAGNOSIS — R509 Fever, unspecified: Secondary | ICD-10-CM | POA: Diagnosis not present

## 2013-09-13 LAB — I-STAT CHEM 8, ED
BUN: 42 mg/dL — AB (ref 6–23)
CALCIUM ION: 1.26 mmol/L (ref 1.13–1.30)
CREATININE: 2.1 mg/dL — AB (ref 0.50–1.10)
Chloride: 106 mEq/L (ref 96–112)
Glucose, Bld: 104 mg/dL — ABNORMAL HIGH (ref 70–99)
HCT: 36 % (ref 36.0–46.0)
Hemoglobin: 12.2 g/dL (ref 12.0–15.0)
Potassium: 4.6 mEq/L (ref 3.7–5.3)
Sodium: 140 mEq/L (ref 137–147)
TCO2: 22 mmol/L (ref 0–100)

## 2013-09-13 LAB — CBC
HCT: 36.3 % (ref 36.0–46.0)
Hemoglobin: 11.5 g/dL — ABNORMAL LOW (ref 12.0–15.0)
MCH: 30.6 pg (ref 26.0–34.0)
MCHC: 31.7 g/dL (ref 30.0–36.0)
MCV: 96.5 fL (ref 78.0–100.0)
Platelets: 272 10*3/uL (ref 150–400)
RBC: 3.76 MIL/uL — ABNORMAL LOW (ref 3.87–5.11)
RDW: 13.2 % (ref 11.5–15.5)
WBC: 7.2 10*3/uL (ref 4.0–10.5)

## 2013-09-13 LAB — I-STAT TROPONIN, ED: Troponin i, poc: 0 ng/mL (ref 0.00–0.08)

## 2013-09-13 LAB — TSH: TSH: 2.65 u[IU]/mL (ref 0.350–4.500)

## 2013-09-13 MED ORDER — ASPIRIN 81 MG PO CHEW
324.0000 mg | CHEWABLE_TABLET | Freq: Once | ORAL | Status: AC
  Administered 2013-09-13: 243 mg via ORAL
  Filled 2013-09-13: qty 4

## 2013-09-13 NOTE — ED Notes (Signed)
Per EMS, patient c/o generalized weakness and "not feeling well" x2 weeks and reports intermittent left sided chest pain x1 day. Pt. Ambulatory, denies chest pain on arrival. Stage 4 renal failure without dialysis, reports took self off BP medication x2-3 months ago.

## 2013-09-13 NOTE — ED Provider Notes (Signed)
CSN: [id number]     Arrival date & time 09/13/13  2001 History   First MD Initiated Contact with Patient 09/13/13 2017     Chief Complaint  Patient presents with  . Chest Pain  . Weakness    Patient is a 80 y.o. female presenting with chest pain. The history is provided by the patient.  Chest Pain Pain location:  L chest Pain quality: sharp   Pain radiates to:  Does not radiate Pain radiates to the back: no   Pain severity:  Moderate Onset quality:  Sudden Duration:  1 day Timing:  Intermittent Progression:  Resolved Chronicity:  Recurrent Context: at rest   Context: not eating, not lifting, not raising an arm and no trauma   Context comment:  No exertional cp Relieved by:  Nothing Associated symptoms: fatigue (for 2 weeks)   Associated symptoms: no abdominal pain, no anxiety, no back pain, no cough, no nausea, no orthopnea, no palpitations, no shortness of breath, not vomiting and no weakness   Risk factors: no prior DVT/PE and no smoking    No hemoptysis, leg swelling. Has had fever week ago and chronic nasal congestion. Hx of COPD, denies SOB/wheezing. Not sleeping. Depressed.   Past Medical History  Diagnosis Date  . History of diverticulitis of colon   . HLD (hyperlipidemia)   . Anxiety   . Osteoporosis   . Depression   . GERD (gastroesophageal reflux disease)   . IBS (irritable bowel syndrome)   . Glucose intolerance (impaired glucose tolerance)   . Allergic rhinitis   . Migraine   . HTN (hypertension)     Dr. Martinique, cards  . SUI (stress urinary incontinence, female)     Dr. Amalia Hailey, urology (some urge as well)  . Diverticulosis     colonsocopy 2002 aborted 2/2 tortuous colon and heavy sigmoid diverticulosis  . Dysphagia     EGD 2012 - wnl, s/p esoph dilation  . CKD (chronic kidney disease) stage 4, GFR 15-29 ml/min 12/2012    [place], off ACEI, bp ok slightly elevated to maintain renal perfusion  . Diastolic dysfunction     per echo in November 2012; normal  EF  . DDD (degenerative disc disease), lumbar   . 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   Marland Kitchen History of CVA (cerebrovascular accident) 2012    old per prior head CT   Past Surgical History  Procedure Laterality Date  . Tonsillectomy  1942  . Submucous resection  1962  . Tubal ligation  1973  . Breast enhancement surgery  1982  . Total abdominal hysterectomy  1996    for frequent dysmennorhea  . Macroplastique implantation  03/2008    Dr. Rogers Blocker  . Uretherolysis and removal of macroplastique  09/13/08    Dr. Amalia Hailey  . Ct head limited w/o cm  04/2010    nothing acute, ? old infarcts L internal capsule  . Renal doppler  2009?    simple cysts, wnl  . Colonoscopy  2002    does not want another!  . Esophagogastroduodenoscopy  2012    WNL, s/p esoph dilation (Dr. Candace Cruise, [facility])  . US echocardiography  11/2010    EF 55-60%, mild LAE, mild diastolic dysfunction, normal valves   Family History  Problem Relation Age of Onset  . Heart failure Father   . Heart disease Father   . Cancer Father     prostate  .  Hypertension Father   . Coronary artery disease Father   . Leukemia Mother     CLL prior   History  Substance Use Topics  . Smoking status: Former Smoker -- 0.50 packs/day for 54 years    Types: Cigarettes    Start date: 01/18/1954    Quit date: 01/19/2008  . Smokeless tobacco: Never Used  . Alcohol Use: Yes     Comment: 1 1/2-2 glasses white wine/day   OB History   Grav Para Term Preterm Abortions TAB SAB Ect Mult Living                 Review of Systems  Constitutional: Positive for fatigue (for 2 weeks).  Respiratory: Negative for cough and shortness of breath.   Cardiovascular: Positive for chest pain. Negative for palpitations and orthopnea.  Gastrointestinal: Negative for nausea, vomiting and abdominal pain.  Musculoskeletal: Negative for back pain.  Neurological: Negative for weakness.     Allergies  Amlodipine; Doxycycline monohydrate; and Ramipril  Home Medications   Prior to Admission medications   Medication Sig Start Date End Date Taking? Authorizing Provider  acetaminophen (TYLENOL) 500 MG tablet Take 500 mg by mouth 2 (two) times daily as needed for pain.    Historical Provider, MD  ALPRAZolam Duanne Moron) 0.25 MG tablet TAKE ONE TABLET BY MOUTH TWICE DAILY AS NEEDED 09/13/13   Ria Bush, MD  aspirin EC 81 MG tablet Take 81 mg by mouth daily.    Historical Provider, MD  calcitRIOL (ROCALTROL) 0.25 MCG capsule Take 0.25 mcg by mouth 3 (three) times a week. M,W,F    Historical Provider, MD  furosemide (LASIX) 20 MG tablet TAKE ONE TABLET BY MOUTH TWICE DAILY 08/09/13   Ria Bush, MD  gabapentin (NEURONTIN) 100 MG capsule Take one tablet 2 times daily and 2 tablets at bedtime 07/31/13   Ria Bush, MD  HYDROcodone-acetaminophen (NORCO/VICODIN) 5-325 MG per tablet Take 1 tablet by mouth 2 (two) times daily as needed for moderate pain. 09/07/13   Ria Bush, MD  nebivolol (BYSTOLIC) 5 MG tablet Take 5 mg by mouth daily.    Historical Provider, MD  zolpidem (AMBIEN) 5 MG tablet TAKE ONE TABLET BY MOUTH ONCE DAILY AT BEDTIME 08/13/13   Ria Bush, MD   BP 161/97  Pulse 67  Temp(Src) 97.7 F (36.5 C) (Oral)  Resp 16  SpO2 97% Physical Exam  Nursing note and vitals reviewed. Constitutional: She is oriented to person, place, and time. She appears well-developed and well-nourished. No distress.  HENT:  Head: Normocephalic and atraumatic.  Nose: Nose normal.  Mouth/Throat: No oropharyngeal exudate.  Eyes: Conjunctivae are normal.  No conj pallor  Neck: Normal range of motion. Neck supple. No tracheal deviation present. No thyromegaly present.  Cardiovascular: Normal rate, regular rhythm and normal heart sounds.   No murmur heard. Pulmonary/Chest: Effort normal and breath sounds normal. No respiratory distress. She has no rales. She exhibits  tenderness (left lateral wall, but does not reproduce).  Abdominal: Soft. Bowel sounds are normal. She exhibits no distension and no mass. There is no tenderness.  Musculoskeletal: Normal range of motion. She exhibits no edema.  No lower extrem edema, erythema or warmth.   Neurological: She is alert and oriented to person, place, and time.  Skin: Skin is warm and dry. No rash noted.  Psychiatric: She has a normal mood and affect.    ED Course  Procedures (including critical care time) Labs Review Labs Reviewed  CBC - Abnormal; Notable for  the following:    RBC 3.76 (*)    Hemoglobin 11.5 (*)    All other components within normal limits  I-STAT CHEM 8, ED - Abnormal; Notable for the following:    BUN 42 (*)    Creatinine, Ser 2.10 (*)    Glucose, Bld 104 (*)    All other components within normal limits  TSH  I-STAT TROPOININ, ED    Imaging Review Dg Chest 2 View  09/13/2013   CLINICAL DATA:  Insomnia, weakness, and lethargy for 2 weeks. Cough and congestion last week. Headaches. Low grade fever.  EXAM: CHEST  2 VIEW  COMPARISON:  03/16/2013  FINDINGS: Normal heart size and pulmonary vascularity. No focal airspace disease or consolidation in the lungs. No blunting of costophrenic angles. No pneumothorax. Mediastinal contours appear intact. Bilateral breast implants. Calcification of the aorta.  IMPRESSION: No active cardiopulmonary disease.   Electronically Signed   By: Lucienne Capers M.D.   On: 09/13/2013 21:49   Dg Bone Density  09/12/2013   Date of study: 09/12/2013 Exam: DUAL X-RAY ABSORPTIOMETRY (DXA) FOR BONE MINERAL DENSITY (BMD) Instrument: [employer] Requesting Provider: Dr Danise [name] Indication: f/u for osteoporosis Comparison: none (please note that it is not possible to compare data from  different instruments) Clinical data: Pt is a 80 y.o. female without h/o fractures. On  Calcitriol.  Results:  Lumbar spine (L1-L4) Femoral neck (FN)  T-score - 2.2 RFN:- 3.0 LFN:  - 3.0  BMD (g/cm2) 0.912 RFN: 0.627 LFN: 0.617   Assessment: Patient has OSTEOPOROSIS according to the [facility] classification  for osteoporosis (see below). Fracture risk: high Comments: the technical quality of the study is good Evaluation for secondary causes should be considered if clinically  indicated.  Recommend optimizing calcium (1200 mg/day) and vitamin D (800 IU/day).  Treatment is indicated. Followup: Repeat BMD is appropriate after 2 years or after 1 years if  starting treatment.  WHO criteria for diagnosis of osteoporosis in postmenopausal women and in  men 23 y/o or older:  - normal: T-score -1.0 to + 1.0 - osteopenia/low bone density: T-score between -2.5 and -1.0 - osteoporosis: T-score below -2.5 - severe osteoporosis: T-score below -2.5 with history of fragility  fracture Note: although not part of the WHO classification, the presence of a  fragility fracture, regardless of the T-score, should be considered  diagnostic of osteoporosis, provided other causes for the fracture have  been excluded.  Treatment: The National Osteoporosis Foundation recommends that treatment  be considered in postmenopausal women and men age 18 or older with: 1. Hip or vertebral (clinical or morphometric) fracture 2. T-score of - 2.5 or lower at the spine or hip 3. 10-year fracture probability by FRAX of at least 20% for a major  osteoporotic fracture and 3% for a hip fracture  Philemon Kingdom, MD [facility]     EKG Interpretation None      Date: 09/13/2013  Rate: 53  Rhythm: normal sinus rhythm  QRS Axis: normal  Intervals: normal  ST/T Wave abnormalities: normal  Conduction Disutrbances:none  Narrative Interpretation: Normal  Old EKG Reviewed: unchanged  MDM   Final diagnoses:  Chest pain, unspecified chest pain type  Other fatigue  Insomnia  CKD (chronic kidney disease), stage 4 (severe)    Hx HLD, HTN, DM, CKD, dCHF presents for fatigue for 2 weeks with insomnia and depressed mood.   Notes 1 day fleeting left sided CP.  Atypical hx.  Sharp, nonradiating, lasts 1 second and goes  away.  No hx of angina. No signs of DVT, VSS w/o hypoxia or tachypnea, no hemoptysis/ cough, very low suspicion for PE.  EKG without ischemic changes and troponin wnl.  Doubt ACS.  No GI complaints.  TSH wnl.  CXR without fx or underlying lung disease.  No signs of volume overload. BP fluctuating, defer treatment as their is no evidenc eof end organ damage and do not know pts baseline.  Kidney function at baseline. Not anemic.  Spoke with patient regarding further screening with PCP regarding depression.  Will increase dose of Ambien.   Strict return precautions discussed.    Tammy Sours, MD 09/13/13 [id number]

## 2013-09-13 NOTE — Telephone Encounter (Signed)
Rx called in as directed.   

## 2013-09-13 NOTE — Discharge Instructions (Signed)

## 2013-09-13 NOTE — Telephone Encounter (Signed)
Plz phone in

## 2013-09-14 ENCOUNTER — Telehealth: Payer: Self-pay

## 2013-09-14 ENCOUNTER — Encounter: Payer: Self-pay | Admitting: Family Medicine

## 2013-09-14 ENCOUNTER — Encounter: Payer: Self-pay | Admitting: *Deleted

## 2013-09-14 ENCOUNTER — Telehealth: Payer: Self-pay | Admitting: Family Medicine

## 2013-09-14 ENCOUNTER — Other Ambulatory Visit: Payer: Self-pay | Admitting: Family Medicine

## 2013-09-14 NOTE — Telephone Encounter (Signed)
Recommend she come in for OV prior to starting new med.

## 2013-09-14 NOTE — Telephone Encounter (Signed)
Ok to do this but would want this to be rare occasion. If needed frequently, to notify us.

## 2013-09-14 NOTE — Telephone Encounter (Signed)
Pt left v/m; pt was seen at [facility] ED last night due to CP, fatigue and insomnia.pt was advised at ED may need med for depression. Pt request med for depression without coming in for appt. Pt request cb.

## 2013-09-14 NOTE — Telephone Encounter (Signed)
Ok to refill 

## 2013-09-14 NOTE — Telephone Encounter (Signed)
plz phone in. 

## 2013-09-14 NOTE — Telephone Encounter (Signed)
Pt was seen [place] on 09/13/13 with fatigue,depression and insomnia; pt was advised can take zolpidem at hs and if wakes at 2 AM can take another zolpidem. Pt wants Dr Darnell Level advice if this is OK.

## 2013-09-14 NOTE — Telephone Encounter (Signed)
Patient notified and appt scheduled.

## 2013-09-14 NOTE — Telephone Encounter (Signed)
Rx called in as directed.   

## 2013-09-17 NOTE — Telephone Encounter (Signed)
Patient notified and verbalized understanding. 

## 2013-09-19 ENCOUNTER — Ambulatory Visit (INDEPENDENT_AMBULATORY_CARE_PROVIDER_SITE_OTHER): Payer: Medicare Other | Admitting: Family Medicine

## 2013-09-19 ENCOUNTER — Encounter: Payer: Self-pay | Admitting: Family Medicine

## 2013-09-19 VITALS — BP 126/60 | HR 60 | Temp 98.1°F | Wt 129.2 lb

## 2013-09-19 DIAGNOSIS — M810 Age-related osteoporosis without current pathological fracture: Secondary | ICD-10-CM

## 2013-09-19 DIAGNOSIS — Z23 Encounter for immunization: Secondary | ICD-10-CM | POA: Diagnosis not present

## 2013-09-19 DIAGNOSIS — F3289 Other specified depressive episodes: Secondary | ICD-10-CM

## 2013-09-19 DIAGNOSIS — E559 Vitamin D deficiency, unspecified: Secondary | ICD-10-CM

## 2013-09-19 DIAGNOSIS — F329 Major depressive disorder, single episode, unspecified: Secondary | ICD-10-CM

## 2013-09-19 DIAGNOSIS — N184 Chronic kidney disease, stage 4 (severe): Secondary | ICD-10-CM

## 2013-09-19 DIAGNOSIS — G4700 Insomnia, unspecified: Secondary | ICD-10-CM

## 2013-09-19 DIAGNOSIS — F32A Depression, unspecified: Secondary | ICD-10-CM

## 2013-09-19 MED ORDER — SERTRALINE HCL 25 MG PO TABS: 25.0000 mg | ORAL_TABLET | Freq: Every day | ORAL | Status: DC

## 2013-09-19 NOTE — Addendum Note (Signed)
Addended by: Royann Shivers A on: 09/19/2013 12:56 PM   Modules accepted: Orders

## 2013-09-19 NOTE — Patient Instructions (Addendum)
Flu shot today Let's send you to see a dietician for a custom meal plan with your kidney disease. Let's try low dose sertraline 25mg  daily for mood. Return in 1-2 months for follow up on mood. For osteoporosis, continue weight bearing exercises like walking.  Check with Dr. Juleen China about prolia for osteoporosis. Good to see you today, call us with questions.

## 2013-09-19 NOTE — Assessment & Plan Note (Signed)
Longstanding depressed mood with several +criteria on questioning today.  No SI/HI. rec start sertraline 25mg  daily - discussed common side effects and 3-4 wks to take full effect.

## 2013-09-19 NOTE — Assessment & Plan Note (Signed)
DEXA Date: 08/2013 Spine -2.2, hip -3.5 Discussed increased weight bearing exercises, discussed increased hip fracture risk, discussed cal/vit D intake. I suggested she check with Dr. Juleen China renal about prolia injections in setting of her CKD stage 4.

## 2013-09-19 NOTE — Progress Notes (Signed)
Pre visit review using our clinic review tool, if applicable. No additional management support is needed unless otherwise documented below in the visit note. 

## 2013-09-19 NOTE — Assessment & Plan Note (Signed)
See above

## 2013-09-19 NOTE — Assessment & Plan Note (Signed)
Vit D 36 (11/2012). Pt states she takes vit D daily along with calcitriol.

## 2013-09-19 NOTE — Assessment & Plan Note (Signed)
Continue ambien 5mg  daily.

## 2013-09-19 NOTE — Progress Notes (Signed)
BP 126/60  Pulse 60  Temp(Src) 98.1 F (36.7 C) (Oral)  Wt 129 lb 4 oz (58.627 kg)   CC: discuss meds  Subjective:    Patient ID: Nicole English, female    DOB: 06/01/1932, 80 y.o.   MRN: [id number]  HPI: Nicole English is a 80 y.o. female presenting on 09/19/2013 for Discuss meds   Recent eval at ER with fatigue, insomnia depression and malaise, fleeting L chest pain. Reviewed workup - overall unrevealing workup (CXR, EKG, TnI).  Depression - notices crying more easily. Endorses depressed mood over last year. Notes increased insomnia recently, decreased appetite, decreased energy, decreased concentration. Denies anhedonia however. Does enjoy caring for granddaughter. Interested in ancestry/lineage work.  Lab Results  Component Value Date   TSH 2.650 09/13/2013    Having more trouble finding diet plan. Did go to class through kidney [facility] but would like personal assistance with nutritionist.   Osteoporosis - DEXA Date: 08/2013 Spine -2.2, hip -3.5. H/o CKD stage 4. Last Vit D 36 (11/2012). Has cane but doesn't use.  Lab Results  Component Value Date   CREATININE 2.10* 09/13/2013    Insomnia - taking ambien 5mg  nightly. Has been able to sleep better since seen at ER. Reassured.  Relevant past medical, surgical, family and social history reviewed and updated as indicated.  Allergies and medications reviewed and updated. Current Outpatient Prescriptions on File Prior to Visit  Medication Sig  . acetaminophen (TYLENOL) 500 MG tablet Take 500 mg by mouth 2 (two) times daily as needed for pain.  Marland Kitchen ALPRAZolam (XANAX) 0.25 MG tablet Take 0.25 mg by mouth 2 (two) times daily as needed for anxiety.  Marland Kitchen aspirin EC 81 MG tablet Take 81 mg by mouth daily.  . calcitRIOL (ROCALTROL) 0.25 MCG capsule Take 0.25 mcg by mouth 3 (three) times a week. M,W,F  . furosemide (LASIX) 20 MG tablet Take 20 mg by mouth 2 (two) times daily.  Marland Kitchen gabapentin (NEURONTIN) 100 MG capsule Take 100-200 mg by mouth  3 (three) times daily. Take 100mg  morning & noon, then take 200mg  at bedtime  . HYDROcodone-acetaminophen (NORCO/VICODIN) 5-325 MG per tablet Take 1 tablet by mouth 2 (two) times daily as needed for moderate pain.  . nebivolol (BYSTOLIC) 5 MG tablet Take 5 mg by mouth daily.  Marland Kitchen zolpidem (AMBIEN) 5 MG tablet TAKE ONE TABLET BY MOUTH ONCE DAILY AT BEDTIME   No current facility-administered medications on file prior to visit.    Review of Systems Per HPI unless specifically indicated above    Objective:    BP 126/60  Pulse 60  Temp(Src) 98.1 F (36.7 C) (Oral)  Wt 129 lb 4 oz (58.627 kg)  Physical Exam  Nursing note and vitals reviewed. Constitutional: She appears well-developed and well-nourished. No distress.  Psychiatric: She has a normal mood and affect.   Results for orders placed during the hospital encounter of 09/13/13  CBC      Result Value Ref Range   WBC 7.2  4.0 - 10.5 K/uL   RBC 3.76 (*) 3.87 - 5.11 MIL/uL   Hemoglobin 11.5 (*) 12.0 - 15.0 g/dL   HCT 36.3  36.0 - 46.0 %   MCV 96.5  78.0 - 100.0 fL   MCH 30.6  26.0 - 34.0 pg   MCHC 31.7  30.0 - 36.0 g/dL   RDW 13.2  11.5 - 15.5 %   Platelets 272  150 - 400 K/uL  TSH  Result Value Ref Range   TSH 2.650  0.350 - 4.500 uIU/mL  I-STAT TROPOININ, ED      Result Value Ref Range   Troponin i, poc 0.00  0.00 - 0.08 ng/mL   Comment 3           I-STAT CHEM 8, ED      Result Value Ref Range   Sodium 140  137 - 147 mEq/L   Potassium 4.6  3.7 - 5.3 mEq/L   Chloride 106  96 - 112 mEq/L   BUN 42 (*) 6 - 23 mg/dL   Creatinine, Ser 2.10 (*) 0.50 - 1.10 mg/dL   Glucose, Bld 104 (*) 70 - 99 mg/dL   Calcium, Ion 1.26  1.13 - 1.30 mmol/L   TCO2 22  0 - 100 mmol/L   Hemoglobin 12.2  12.0 - 15.0 g/dL   HCT 36.0  36.0 - 46.0 %      Assessment & Plan:   Problem List Items Addressed This Visit   VITAMIN D DEFICIENCY     Vit D 36 (11/2012). Pt states she takes vit D daily along with calcitriol.    OSTEOPOROSIS      DEXA Date: 08/2013 Spine -2.2, hip -3.5 Discussed increased weight bearing exercises, discussed increased hip fracture risk, discussed cal/vit D intake. I suggested she check with Dr. Juleen China renal about prolia injections in setting of her CKD stage 4.    Relevant Medications      Oyster Shell (OYSTER CALCIUM) 500 MG TABS tablet      cholecalciferol (VITAMIN D) 1000 UNITS tablet   INSOMNIA-SLEEP DISORDER-UNSPEC     Continue ambien 5mg  daily.    Depression - Primary     Longstanding depressed mood with several +criteria on questioning today.  No SI/HI. rec start sertraline 25mg  daily - discussed common side effects and 3-4 wks to take full effect.    Relevant Medications      sertraline (ZOLOFT) tablet   CKD (chronic kidney disease) stage 4, GFR 15-29 ml/min   Relevant Orders      Amb ref to Medical Nutrition Therapy-MNT       Follow up plan: Return in about 2 months (around 11/19/2013), or as needed, for follow up visit.

## 2013-09-20 NOTE — ED Provider Notes (Signed)
I saw and evaluated the patient, reviewed the resident's note and I agree with the findings and plan.   EKG Interpretation   Date/Time:  Thursday September 13 2013 20:16:57 EDT Ventricular Rate:  53 PR Interval:  174 QRS Duration: 94 QT Interval:  439 QTC Calculation: 412 R Axis:   78 Text Interpretation:  Age not entered, assumed to be  80 years old for  purpose of ECG interpretation Sinus rhythm Atrial premature complex ED  PHYSICIAN INTERPRETATION AVAILABLE IN [facility] Confirmed by TEST,  Record ([id number]) on 09/15/2013 8:54:50 AM      Patient discussed with Dr. Priscille Loveless. Examined independently. Describes atypical chest pain. Evaluation studies reassuring.  Exam shows patient not dyspneic or tachypneic. Well oxygenated afebrile. Appropriate for outpatient followup. Strict return precautions given.  Tanna Furry, MD 09/20/13 2111

## 2013-09-21 DIAGNOSIS — Z85828 Personal history of other malignant neoplasm of skin: Secondary | ICD-10-CM | POA: Diagnosis not present

## 2013-09-21 DIAGNOSIS — N184 Chronic kidney disease, stage 4 (severe): Secondary | ICD-10-CM | POA: Diagnosis not present

## 2013-09-21 DIAGNOSIS — L570 Actinic keratosis: Secondary | ICD-10-CM | POA: Diagnosis not present

## 2013-09-21 DIAGNOSIS — L821 Other seborrheic keratosis: Secondary | ICD-10-CM | POA: Diagnosis not present

## 2013-09-21 DIAGNOSIS — I129 Hypertensive chronic kidney disease with stage 1 through stage 4 chronic kidney disease, or unspecified chronic kidney disease: Secondary | ICD-10-CM | POA: Diagnosis not present

## 2013-09-21 DIAGNOSIS — D631 Anemia in chronic kidney disease: Secondary | ICD-10-CM | POA: Diagnosis not present

## 2013-09-21 DIAGNOSIS — N2581 Secondary hyperparathyroidism of renal origin: Secondary | ICD-10-CM | POA: Diagnosis not present

## 2013-09-21 DIAGNOSIS — N039 Chronic nephritic syndrome with unspecified morphologic changes: Secondary | ICD-10-CM | POA: Diagnosis not present

## 2013-09-21 DIAGNOSIS — D1801 Hemangioma of skin and subcutaneous tissue: Secondary | ICD-10-CM | POA: Diagnosis not present

## 2013-09-21 LAB — PTH, INTACT: PTH INTERP: 167

## 2013-09-21 LAB — COMPREHENSIVE METABOLIC PANEL
BUN: 48 mg/dL — AB (ref 4–21)
CREATININE: 2.15
Glucose: 133
PHOSPHORUS: 5.2 mg/dL — AB (ref 2.5–4.9)
POTASSIUM: 4.8 mmol/L

## 2013-10-01 ENCOUNTER — Encounter: Payer: Self-pay | Admitting: *Deleted

## 2013-10-09 ENCOUNTER — Other Ambulatory Visit: Payer: Self-pay | Admitting: Family Medicine

## 2013-10-09 ENCOUNTER — Telehealth: Payer: Self-pay | Admitting: Family Medicine

## 2013-10-09 MED ORDER — HYDROCODONE-ACETAMINOPHEN 5-325 MG PO TABS: 1.0000 | ORAL_TABLET | Freq: Two times a day (BID) | ORAL | Status: DC | PRN

## 2013-10-09 NOTE — Telephone Encounter (Signed)
Patient notified that script is up front ready for pickup.

## 2013-10-09 NOTE — Telephone Encounter (Signed)
Printed and placed in Kim's box. 

## 2013-10-09 NOTE — Telephone Encounter (Signed)
Pt called and would like to get refill on HYDROcodone-acetaminophen (NORCO/VICODIN) 5-325 MG per tablet [id number].  Please call pt when ready for pickup [phone] / lt

## 2013-10-10 ENCOUNTER — Other Ambulatory Visit: Payer: Self-pay | Admitting: Family Medicine

## 2013-10-10 NOTE — Telephone Encounter (Signed)
plz phone in. Is patient taking 5mg  or 10mg  ?

## 2013-10-10 NOTE — Telephone Encounter (Signed)
Pt left v/m; pt requested refill zolpidem; pt had to take 2 tabs at hs after going to ER and last night was first night pt had rested. Pt has enough med to last tonight; pt is going to be traveling and request cb. [facility]. Pt is very upset that when a med is denied she did not get an immediate cb. Pt wants answer to why she was not notified med was denied. I tried to explain that if refill request is initiated by a pharmacy that is who is notified of approval or denial.  Pt wants to know when any med is sent to pharmacy and pt wants med delivered to her. Pt does not want to go back to pick med up again. I advised pt to my understanding [facility] does not do home deliveries. Pt checked and said [facility] not [facility] and still wants [facility].

## 2013-10-10 NOTE — Telephone Encounter (Signed)
Last filled 09/14/13

## 2013-10-11 NOTE — Telephone Encounter (Signed)
5mg  and pt notified Rx was sent to pharmacy

## 2013-10-11 NOTE — Telephone Encounter (Signed)
Patient returned your call.  Please call patient back at [phone].

## 2013-10-11 NOTE — Telephone Encounter (Signed)
Left voicemail requesting pt to call office  Rx called in as prescribed

## 2013-10-12 ENCOUNTER — Other Ambulatory Visit: Payer: Self-pay | Admitting: Family Medicine

## 2013-10-13 NOTE — Telephone Encounter (Signed)
plz phone in. 

## 2013-10-15 ENCOUNTER — Other Ambulatory Visit: Payer: Self-pay | Admitting: Family Medicine

## 2013-10-15 NOTE — Telephone Encounter (Signed)
Rx called in to pharmacy. 

## 2013-10-15 NOTE — Telephone Encounter (Signed)
plz phone in. 

## 2013-10-26 DIAGNOSIS — H04123 Dry eye syndrome of bilateral lacrimal glands: Secondary | ICD-10-CM | POA: Diagnosis not present

## 2013-10-26 DIAGNOSIS — Z961 Presence of intraocular lens: Secondary | ICD-10-CM | POA: Diagnosis not present

## 2013-11-02 NOTE — Telephone Encounter (Signed)
No information was typed/tmj 

## 2013-11-09 ENCOUNTER — Other Ambulatory Visit: Payer: Self-pay | Admitting: Family Medicine

## 2013-11-09 ENCOUNTER — Other Ambulatory Visit: Payer: Self-pay

## 2013-11-09 NOTE — Telephone Encounter (Signed)
Pt left v/m requesting rx hydrocodone apap. Call when ready for pick up. Pt will be out of med Monday and request to pick up rx. V/M also noted that pt had flu shot 09/19/13 and now pt has the flu. Pt just wanted Dr Darnell Level to be made aware.

## 2013-11-09 NOTE — Telephone Encounter (Signed)
Ok to refill in Dr. Synthia Innocent absence? Last filled 10/10/13 #30 with 0RF. Last OV 09/22/13.

## 2013-11-11 MED ORDER — HYDROCODONE-ACETAMINOPHEN 5-325 MG PO TABS: 1.0000 | ORAL_TABLET | Freq: Two times a day (BID) | ORAL | Status: DC | PRN

## 2013-11-11 NOTE — Telephone Encounter (Signed)
Please call in.  Thanks.   

## 2013-11-11 NOTE — Telephone Encounter (Signed)
Printed.  Routed to PCP as FYI.

## 2013-11-12 ENCOUNTER — Other Ambulatory Visit: Payer: Self-pay | Admitting: Family Medicine

## 2013-11-12 NOTE — Telephone Encounter (Signed)
Ok to refill 

## 2013-11-12 NOTE — Telephone Encounter (Signed)
Patient notified that script is up front ready for pickup. Patient stated that she called 11/09/13 and has been out of her medication for 2 days. Apologized to patient advising her that the refill request came in right before the weekend, but advised her that the script is up front now for pickup.

## 2013-11-12 NOTE — Telephone Encounter (Signed)
Rx called to pharmacy as instructed. 

## 2013-11-13 ENCOUNTER — Other Ambulatory Visit: Payer: Self-pay | Admitting: Family Medicine

## 2013-11-13 NOTE — Telephone Encounter (Signed)
plz phone in xanax. 

## 2013-11-13 NOTE — Telephone Encounter (Signed)
Medication phoned to [place] spoke with Katrina as instructed. Pt advised med was called in and Katrina said would be ready for pickup at or after 12 noon. Pt voiced understanding.

## 2013-11-13 NOTE — Telephone Encounter (Signed)
Pt does not have alprazolam for her afternoon dose today. Pt request med be refilled ASAP and pt request cb when refilled to [facility].

## 2013-11-15 ENCOUNTER — Telehealth: Payer: Self-pay | Admitting: Cardiology

## 2013-11-15 NOTE — Telephone Encounter (Signed)
New message     Pt needs her bystolic presc changed to the current directions.  She takes 1 tablet daily.  The old presc says 2 tablets daily and her ins company will not pay for 2 tablets daily.  She is not in need for a refill yet, just want it fixed on her record so when she needs a refill it will be correct.

## 2013-11-15 NOTE — Telephone Encounter (Signed)
Follow up         Pt is mailing insurance letter to Dr. Martinique for him to fill out and fax back to the pharmacy / pt states paperwork needs to be in by the next 5 days / I offered pt the option of faxing it to get to him sooner and pt declined

## 2013-11-15 NOTE — Telephone Encounter (Signed)
Returned call to patient no answer.LMTC. 

## 2013-11-15 NOTE — Telephone Encounter (Signed)
Pt was returning [facility] phone call

## 2013-11-15 NOTE — Telephone Encounter (Signed)
Returned call to patient she stated bystolic prescription was never changed to 5 mg.Stated she mailed insurance form today that will need to be completed.

## 2013-11-19 ENCOUNTER — Other Ambulatory Visit: Payer: Self-pay

## 2013-11-19 MED ORDER — NEBIVOLOL HCL 5 MG PO TABS: 5.0000 mg | ORAL_TABLET | Freq: Every day | ORAL | Status: DC

## 2013-11-19 NOTE — Telephone Encounter (Signed)
Pt said Dr Martinique has been refilling Bystolic in past but Dr Doug Sou office told pt since Dr Darnell Level reduced the Bystolic to once daily they request Dr Darnell Level to do refills. Pt has been taking Bystolic 5 mg one daily. Pt is going out of town on 11/21/13 and request refill sent to [facility].Please advise.

## 2013-11-19 NOTE — Telephone Encounter (Signed)
plz notify this was sent in. 

## 2013-11-20 NOTE — Telephone Encounter (Signed)
Patient notified

## 2013-12-05 ENCOUNTER — Other Ambulatory Visit: Payer: Self-pay

## 2013-12-05 MED ORDER — NEBIVOLOL HCL 5 MG PO TABS: 5.0000 mg | ORAL_TABLET | Freq: Every day | ORAL | Status: DC

## 2013-12-05 NOTE — Telephone Encounter (Signed)
Spoke to patient we never received insurance form you mailed about bystolic.She stated she is taking bystolic 5 mg daily and she needed prescription sent to pharmacy with those directions.Bystolic refill sent to pharmacy. Stated her new insurance takes effect first of the year.Advised keep appointment with Dr.Jordan 12/07/13.

## 2013-12-07 ENCOUNTER — Encounter: Payer: Self-pay | Admitting: Cardiology

## 2013-12-07 ENCOUNTER — Ambulatory Visit (INDEPENDENT_AMBULATORY_CARE_PROVIDER_SITE_OTHER): Payer: Medicare Other | Admitting: Cardiology

## 2013-12-07 VITALS — BP 124/64 | HR 50 | Ht 62.0 in | Wt 128.5 lb

## 2013-12-07 DIAGNOSIS — I5189 Other ill-defined heart diseases: Secondary | ICD-10-CM

## 2013-12-07 DIAGNOSIS — J449 Chronic obstructive pulmonary disease, unspecified: Secondary | ICD-10-CM

## 2013-12-07 DIAGNOSIS — N184 Chronic kidney disease, stage 4 (severe): Secondary | ICD-10-CM | POA: Diagnosis not present

## 2013-12-07 DIAGNOSIS — I10 Essential (primary) hypertension: Secondary | ICD-10-CM

## 2013-12-07 DIAGNOSIS — I519 Heart disease, unspecified: Secondary | ICD-10-CM

## 2013-12-07 MED ORDER — NEBIVOLOL HCL 5 MG PO TABS: 2.5000 mg | ORAL_TABLET | Freq: Every day | ORAL | Status: DC

## 2013-12-07 NOTE — Patient Instructions (Signed)
We will reduce Bystolic to 2.5 mg daily  Continue your other therapy  I will see you in 6 months.

## 2013-12-07 NOTE — Progress Notes (Signed)
[name] Date of Birth: December 05, 1932 Medical Record [id number]  History of Present Illness: Nicole English is seen back today for followup of HTN. She has a history of HTN, diastolic dysfunction, HLD, COPD - with severe airway obstruction on spirometry noted back in January of 2014, GERD, anxiety, depression, diverticulosis and CKD. Intolerant to ACE/ARB due to renal function. Intolerant to Norvasc due to edema. HCTZ has affected her renal indices. Intolerant to higher doses of hydralazine due to swelling and headache. Last Myoview at [facility] 04/2010 with no ischemia and EF of 90%. Echo 03/2012 with EF 60 to [id number], diastolic dysfunction, MAC. Has bilateral carotid disease of 0 to 39% per doppler in 04/2012. Negative event monitor in 03/2012.  Today she states that she has some increased SOB. No edema. Weight is down. Appetite is less. She has mild dizziness. No chest pain. No cough. Recent renal indices have been stable but potassium tends to run high.    Current Outpatient Prescriptions  Medication Sig Dispense Refill  . acetaminophen (TYLENOL) 500 MG tablet Take 500 mg by mouth 2 (two) times daily as needed for pain.    Marland Kitchen ALPRAZolam (XANAX) 0.25 MG tablet TAKE ONE TABLET BY MOUTH TWICE DAILY AS NEEDED 60 tablet 0  . aspirin EC 81 MG tablet Take 81 mg by mouth daily.    . calcitRIOL (ROCALTROL) 0.25 MCG capsule Take 0.25 mcg by mouth 3 (three) times a week. M,W,F    . cholecalciferol (VITAMIN D) 1000 UNITS tablet Take 1,000 Units by mouth daily.    . furosemide (LASIX) 20 MG tablet Take 20 mg by mouth 2 (two) times daily.    Marland Kitchen gabapentin (NEURONTIN) 100 MG capsule Take 100-200 mg by mouth 3 (three) times daily. Take 100mg  morning & noon, then take 200mg  at bedtime    . HYDROcodone-acetaminophen (NORCO/VICODIN) 5-325 MG per tablet Take 1 tablet by mouth 2 (two) times daily as needed for moderate pain. 60 tablet 0  . nebivolol (BYSTOLIC) 5 MG tablet Take 0.5 tablets (2.5 mg total) by mouth daily. 30  tablet 6  . Oyster Shell (OYSTER CALCIUM) 500 MG TABS tablet Take 500 mg of elemental calcium by mouth daily. SEA CALCIUM    . sertraline (ZOLOFT) 25 MG tablet Take 1 tablet (25 mg total) by mouth daily. 30 tablet 3  . zolpidem (AMBIEN) 5 MG tablet TAKE ONE TABLET BY MOUTH AT BEDTIME 30 tablet 0   No current facility-administered medications for this visit.    Allergies  Allergen Reactions  . Amlodipine Other (See Comments)    edema  . Doxycycline Monohydrate Nausea And Vomiting  . Ramipril     Worsening renal function    Past Medical History  Diagnosis Date  . History of diverticulitis of colon   . HLD (hyperlipidemia)   . Anxiety   . Osteoporosis 08/2013    Spine -2.2, hip -3.5  . Depression   . GERD (gastroesophageal reflux disease)   . IBS (irritable bowel syndrome)   . Glucose intolerance (impaired glucose tolerance)   . Allergic rhinitis   . Migraine   . HTN (hypertension)     Dr. Martinique, cards  . SUI (stress urinary incontinence, female)     Dr. Amalia Hailey, urology (some urge as well)  . Diverticulosis     colonsocopy 2002 aborted 2/2 tortuous colon and heavy sigmoid diverticulosis  . Dysphagia     EGD 2012 - wnl, s/p esoph dilation  . CKD (chronic kidney disease) stage 4, GFR  15-29 ml/min 12/2012    [place], off ACEI, bp ok slightly elevated to maintain renal perfusion  . Diastolic dysfunction     per echo in November 2012; normal EF  . DDD (degenerative disc disease), lumbar   . 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   Marland Kitchen History of CVA (cerebrovascular accident) 2012    old per prior head CT    Past Surgical History  Procedure Laterality Date  . Tonsillectomy  1942  . Submucous resection  1962  . Tubal ligation  1973  . Breast enhancement surgery  1982  . Total abdominal hysterectomy  1996    for frequent dysmennorhea  . Macroplastique implantation  03/2008    Dr. Rogers Blocker  .  Uretherolysis and removal of macroplastique  09/13/08    Dr. Amalia Hailey  . Ct head limited w/o cm  04/2010    nothing acute, ? old infarcts L internal capsule  . Renal doppler  2009?    simple cysts, wnl  . Colonoscopy  2002    does not want another!  . Esophagogastroduodenoscopy  2012    WNL, s/p esoph dilation (Dr. Candace Cruise, [facility])  . US echocardiography  11/2010    EF 55-60%, mild LAE, mild diastolic dysfunction, normal valves  . Dexa  08/2013    Spine -2.2, hip -3.5    History  Smoking status  . Former Smoker -- 0.50 packs/day for 54 years  . Types: Cigarettes  . Start date: 01/18/1954  . Quit date: 01/19/2008  Smokeless tobacco  . Never Used    History  Alcohol Use  . Yes    Comment: 1 1/2-2 glasses white wine/day    Family History  Problem Relation Age of Onset  . Heart failure Father   . Heart disease Father   . Cancer Father     prostate  . Hypertension Father   . Coronary artery disease Father   . Leukemia Mother     CLL prior    Review of Systems: The review of systems is per the HPI.  All other systems were reviewed and are negative.  Physical Exam: BP 124/64 mmHg  Pulse 50  Ht 5\' 2"  (1.575 m)  Wt 128 lb 8 oz (58.287 kg)  BMI 23.50 kg/m2  Patient is very pleasant and in no acute distress. Weight is down 6 lbs. Skin is warm and dry. Color is normal.  HEENT is unremarkable. Normocephalic/atraumatic. PERRL. Sclera are nonicteric. Neck is supple. No masses. No JVD. Lungs are clear. Cardiac exam shows a regular rate and rhythm. No gallop or murmur. Abdomen is soft. Extremities are without edema. Gait and ROM are intact. No gross neurologic deficits noted.  LABORATORY DATA: PENDING  Lab Results  Component Value Date   WBC 7.2 09/13/2013   HGB 12.2 09/13/2013   HCT 36.0 09/13/2013   PLT 272 09/13/2013   GLUCOSE 104* 09/13/2013   CHOL 174 07/30/2013   TRIG 96.0 07/30/2013   HDL 62.30 07/30/2013   LDLDIRECT 103.5 12/03/2010   LDLCALC 93 07/30/2013   ALT 11  07/30/2013   AST 17 07/30/2013   NA 140 09/13/2013   K 4.8 09/21/2013   CL 106 09/13/2013   CREATININE 2.15 09/21/2013   BUN 48* 09/21/2013   CO2 23 07/30/2013   TSH 2.650 09/13/2013   INR 1.03 08/19/2012   HGBA1C 5.6 10/02/2008   MICROALBUR 11.4* 11/06/2012   Echo Study Conclusions  from March 2014 - Left ventricle: The cavity size was normal. Wall thickness was normal. Systolic function was normal. The estimated ejection fraction was in the range of 60% to 65%. Wall motion was normal; there were no regional wall motion abnormalities. Findings consistent with left ventricular diastolic dysfunction. - Aortic valve: Sclerosis without stenosis. - Mitral valve: Mildly calcified annulus.  Ecg today: NSR rate 50, otherwise normal.   Assessment / Plan:  1. Chronic diastolic dysfunction - well compensated based on weight and lack of swelling.  We will continue her current dose of Lasix.   2. COPD- I think this is the primary cause of her dyspnea.   3. CKD -stage III. Creatinine 2.3.  4. HTN - BP looks great. She is bradycardic which may be contributing to her dizziness. Will decrease Bystolic to 2.5 mg daily.

## 2013-12-08 ENCOUNTER — Telehealth: Payer: Self-pay | Admitting: Family Medicine

## 2013-12-10 NOTE — Telephone Encounter (Signed)
Pt wanted to confirm gabapentin rx was sent to pharmacy; advised done. Pt has had SOB and thinks must be lung related. Pt recently saw heart doctor and advised SOB not heart related; pt scheduled appt with Dr Darnell Level on 12/12/13 at 9:15 AM; first appt that worked with pts schedule. If pt condition changes or worsens prior to appt pt will cb or if at night go to UC. Pt voiced understanding.

## 2013-12-11 ENCOUNTER — Other Ambulatory Visit: Payer: Self-pay | Admitting: Family Medicine

## 2013-12-11 MED ORDER — HYDROCODONE-ACETAMINOPHEN 5-325 MG PO TABS: 1.0000 | ORAL_TABLET | Freq: Two times a day (BID) | ORAL | Status: DC | PRN

## 2013-12-11 NOTE — Telephone Encounter (Signed)
Printed and may give to patient at appt.

## 2013-12-11 NOTE — Telephone Encounter (Signed)
Pt left v/m requesting rx hydrocodone apap. Pt has appt to see Dr Darnell Level on 12/12/13 at 9:15 AM and pt does not need cb but wants to pick up rx at 12/12/13 appt.Please advise.

## 2013-12-11 NOTE — Telephone Encounter (Signed)
Ok to refill 

## 2013-12-11 NOTE — Telephone Encounter (Signed)
plz phone in. 

## 2013-12-12 ENCOUNTER — Other Ambulatory Visit: Payer: Self-pay | Admitting: Family Medicine

## 2013-12-12 ENCOUNTER — Encounter: Payer: Self-pay | Admitting: Family Medicine

## 2013-12-12 ENCOUNTER — Ambulatory Visit (INDEPENDENT_AMBULATORY_CARE_PROVIDER_SITE_OTHER): Payer: Medicare Other | Admitting: Family Medicine

## 2013-12-12 VITALS — BP 116/72 | HR 50 | Temp 98.0°F | Wt 126.5 lb

## 2013-12-12 DIAGNOSIS — R197 Diarrhea, unspecified: Secondary | ICD-10-CM

## 2013-12-12 DIAGNOSIS — N184 Chronic kidney disease, stage 4 (severe): Secondary | ICD-10-CM

## 2013-12-12 DIAGNOSIS — J449 Chronic obstructive pulmonary disease, unspecified: Secondary | ICD-10-CM

## 2013-12-12 LAB — COMPREHENSIVE METABOLIC PANEL
ALT: 8 U/L (ref 0–35)
AST: 16 U/L (ref 0–37)
Albumin: 4.4 g/dL (ref 3.5–5.2)
Alkaline Phosphatase: 63 U/L (ref 39–117)
BILIRUBIN TOTAL: 0.5 mg/dL (ref 0.2–1.2)
BUN: 48 mg/dL — ABNORMAL HIGH (ref 6–23)
CO2: 24 meq/L (ref 19–32)
CREATININE: 2.2 mg/dL — AB (ref 0.4–1.2)
Calcium: 9.4 mg/dL (ref 8.4–10.5)
Chloride: 103 mEq/L (ref 96–112)
GFR: 23.33 mL/min — ABNORMAL LOW (ref >60.00)
GLUCOSE: 89 mg/dL (ref 70–99)
Potassium: 4.6 mEq/L (ref 3.5–5.1)
Sodium: 137 mEq/L (ref 135–145)
TOTAL PROTEIN: 7 g/dL (ref 6.0–8.3)

## 2013-12-12 LAB — CBC WITH DIFFERENTIAL/PLATELET
BASOS PCT: 1 % (ref 0.0–3.0)
Basophils Absolute: 0.1 10*3/uL (ref 0.0–0.1)
EOS PCT: 5.4 % — AB (ref 0.0–5.0)
Eosinophils Absolute: 0.4 10*3/uL (ref 0.0–0.7)
HEMATOCRIT: 36.5 % (ref 36.0–46.0)
HEMOGLOBIN: 11.8 g/dL — AB (ref 12.0–15.0)
LYMPHS ABS: 2.2 10*3/uL (ref 0.7–4.0)
Lymphocytes Relative: 31 % (ref 12.0–46.0)
MCHC: 32.4 g/dL (ref 30.0–36.0)
MCV: 95.7 fl (ref 78.0–100.0)
Monocytes Absolute: 0.7 10*3/uL (ref 0.1–1.0)
Monocytes Relative: 9.6 % (ref 3.0–12.0)
Neutro Abs: 3.8 10*3/uL (ref 1.4–7.7)
Neutrophils Relative %: 53 % (ref 43.0–77.0)
Platelets: 324 10*3/uL (ref 150.0–400.0)
RBC: 3.82 Mil/uL — AB (ref 3.87–5.11)
RDW: 13.7 % (ref 11.5–15.5)
WBC: 7.1 10*3/uL (ref 4.0–10.5)

## 2013-12-12 MED ORDER — FLUTICASONE-SALMETEROL 100-50 MCG/DOSE IN AEPB: 1.0000 | INHALATION_SPRAY | Freq: Every day | RESPIRATORY_TRACT | Status: DC

## 2013-12-12 MED ORDER — ALBUTEROL SULFATE HFA 108 (90 BASE) MCG/ACT IN AERS: 1.0000 | INHALATION_SPRAY | Freq: Four times a day (QID) | RESPIRATORY_TRACT | Status: DC | PRN

## 2013-12-12 NOTE — Assessment & Plan Note (Signed)
Check Cr today with recent diarrhea. Pt endorses staying well hydrated.

## 2013-12-12 NOTE — Assessment & Plan Note (Signed)
Anticipate recent dyspnea related to known COPD - start albuterol prn as well as advair. Discussed mouth rinse after advair use. Update with effect.

## 2013-12-12 NOTE — Patient Instructions (Addendum)
I do think breathing trouble may be coming from COPD. Start albuterol inhaler as needed for shortness of breath, and start daily advair 1 puff into lungs before brushing teeth every day. Pass by lab for stool studies to check for infection. Make sure you drink plenty of water to stay hydrated. Ok to use immodium once or twice a day.

## 2013-12-12 NOTE — Progress Notes (Signed)
Pre visit review using our clinic review tool, if applicable. No additional management support is needed unless otherwise documented below in the visit note. 

## 2013-12-12 NOTE — Telephone Encounter (Signed)
Rx called in as directed.   

## 2013-12-12 NOTE — Progress Notes (Signed)
BP 116/72 mmHg  Pulse 50  Temp(Src) 98 F (36.7 C) (Oral)  Wt 126 lb 8 oz (57.38 kg)  SpO2 99%   CC: check breathing  Subjective:    Patient ID: Nicole English, female    DOB: [birth year], 80 y.o.   MRN: [id number]  HPI: Nicole English is a 80 y.o. female presenting on 12/12/2013 for Shortness of Breath   Pt has had 6 wk h/o diarrhea off and on since beginning of October. Also endorses mild soreness RLQ. Last BM was this morning - several loose stools. No fevers/chills. No recent abx use.  Severe COPD - not on breathing medication in the past because dyspnea was not activity limiting. Now has had a change. Endorses fullness in lungs worse with minimal exertion. Denies wheezing but stays dyspneic.   Has appt with Dr Juleen China nephrologist next week.   Wt Readings from Last 3 Encounters:  12/12/13 126 lb 8 oz (57.38 kg)  12/07/13 128 lb 8 oz (58.287 kg)  09/19/13 129 lb 4 oz (58.627 kg)  Body mass index is 23.13 kg/(m^2).   Relevant past medical, surgical, family and social history reviewed and updated as indicated.  Allergies and medications reviewed and updated. Current Outpatient Prescriptions on File Prior to Visit  Medication Sig  . acetaminophen (TYLENOL) 500 MG tablet Take 500 mg by mouth 2 (two) times daily as needed for pain.  Marland Kitchen ALPRAZolam (XANAX) 0.25 MG tablet TAKE ONE TABLET BY MOUTH TWICE DAILY AS NEEDED  . aspirin EC 81 MG tablet Take 81 mg by mouth daily.  . calcitRIOL (ROCALTROL) 0.25 MCG capsule Take 0.25 mcg by mouth 3 (three) times a week. M,W,F  . cholecalciferol (VITAMIN D) 1000 UNITS tablet Take 1,000 Units by mouth daily.  . furosemide (LASIX) 20 MG tablet Take 20 mg by mouth 2 (two) times daily.  Marland Kitchen gabapentin (NEURONTIN) 100 MG capsule Take 1 (one) capsule morning and noon and 2 (two) capsules at bedtime.  Marland Kitchen HYDROcodone-acetaminophen (NORCO/VICODIN) 5-325 MG per tablet Take 1 tablet by mouth 2 (two) times daily as needed for moderate pain.  . nebivolol  (BYSTOLIC) 5 MG tablet Take 0.5 tablets (2.5 mg total) by mouth daily.  [place] (OYSTER CALCIUM) 500 MG TABS tablet Take 500 mg of elemental calcium by mouth daily. SEA CALCIUM  . sertraline (ZOLOFT) 25 MG tablet Take 1 tablet (25 mg total) by mouth daily. (Patient taking differently: Take 12.5 mg by mouth daily. )  . zolpidem (AMBIEN) 5 MG tablet TAKE ONE TABLET BY MOUTH AT BEDTIME   No current facility-administered medications on file prior to visit.    Review of Systems Per HPI unless specifically indicated above    Objective:    BP 116/72 mmHg  Pulse 50  Temp(Src) 98 F (36.7 C) (Oral)  Wt 126 lb 8 oz (57.38 kg)  SpO2 99%  Physical Exam  Constitutional: She appears well-developed and well-nourished. No distress.  HENT:  Mouth/Throat: Oropharynx is clear and moist. No oropharyngeal exudate.  Eyes: Conjunctivae and EOM are normal. Pupils are equal, round, and reactive to light.  Neck: Normal range of motion. Neck supple.  Cardiovascular: Normal rate, regular rhythm, normal heart sounds and intact distal pulses.   No murmur heard. Pulmonary/Chest: Effort normal and breath sounds normal. No respiratory distress. She has no decreased breath sounds. She has no wheezes. She has no rhonchi. She has no rales.  Abdominal: Soft. Normal appearance and bowel sounds are normal. She exhibits no distension  and no mass. There is no hepatosplenomegaly. There is no tenderness. There is no rigidity, no rebound, no guarding, no CVA tenderness and negative Murphy's sign.  Musculoskeletal: She exhibits no edema.  Lymphadenopathy:    She has no cervical adenopathy.  Skin: Skin is warm and dry. No rash noted.  Psychiatric: She has a normal mood and affect.  Nursing note and vitals reviewed.      Assessment & Plan:   Problem List Items Addressed This Visit    Diarrhea - Primary    In history of diverticulitis but no significant abd exam today. Check CBC, CMP today. Check stool studies (C  diff and culture) given prolonged diarrheal illness. Encouraged good hydration status, discussed sparing immodium use.    Relevant Orders      C. difficile GDH and Toxin A/B      Stool culture      CBC with Differential      Comprehensive metabolic panel   COPD GOLD III    Anticipate recent dyspnea related to known COPD - start albuterol prn as well as advair. Discussed mouth rinse after advair use. Update with effect.    Relevant Medications      albuterol (PROVENTIL HFA;VENTOLIN HFA) inhaler      ADVAIR DISKUS 100-50 MCG/DOSE IN MISC   CKD (chronic kidney disease) stage 4, GFR 15-29 ml/min    Check Cr today with recent diarrhea. Pt endorses staying well hydrated.    Relevant Orders      Comprehensive metabolic panel       Follow up plan: Return as needed.

## 2013-12-12 NOTE — Telephone Encounter (Signed)
Ok to refill 

## 2013-12-12 NOTE — Telephone Encounter (Signed)
Rx given to patient at appt today.

## 2013-12-12 NOTE — Assessment & Plan Note (Signed)
In history of diverticulitis but no significant abd exam today. Check CBC, CMP today. Check stool studies (C diff and culture) given prolonged diarrheal illness. Encouraged good hydration status, discussed sparing immodium use.

## 2013-12-12 NOTE — Telephone Encounter (Signed)
plz phone in. 

## 2013-12-17 ENCOUNTER — Encounter: Payer: Self-pay | Admitting: *Deleted

## 2013-12-18 DIAGNOSIS — R892 Abnormal level of other drugs, medicaments and biological substances in specimens from other organs, systems and tissues: Secondary | ICD-10-CM

## 2013-12-18 HISTORY — DX: Abnormal level of other drugs, medicaments and biological substances in specimens from other organs, systems and tissues: R89.2

## 2013-12-18 NOTE — Addendum Note (Signed)
Addended by: Ellamae Sia on: 12/18/2013 03:13 PM   Modules accepted: Orders

## 2013-12-20 ENCOUNTER — Telehealth: Payer: Self-pay | Admitting: Family Medicine

## 2013-12-20 DIAGNOSIS — I5030 Unspecified diastolic (congestive) heart failure: Secondary | ICD-10-CM | POA: Diagnosis not present

## 2013-12-20 DIAGNOSIS — E1122 Type 2 diabetes mellitus with diabetic chronic kidney disease: Secondary | ICD-10-CM | POA: Diagnosis not present

## 2013-12-20 DIAGNOSIS — D631 Anemia in chronic kidney disease: Secondary | ICD-10-CM | POA: Diagnosis not present

## 2013-12-20 DIAGNOSIS — N184 Chronic kidney disease, stage 4 (severe): Secondary | ICD-10-CM | POA: Diagnosis not present

## 2013-12-20 DIAGNOSIS — N2581 Secondary hyperparathyroidism of renal origin: Secondary | ICD-10-CM | POA: Diagnosis not present

## 2013-12-20 DIAGNOSIS — I129 Hypertensive chronic kidney disease with stage 1 through stage 4 chronic kidney disease, or unspecified chronic kidney disease: Secondary | ICD-10-CM | POA: Diagnosis not present

## 2013-12-20 LAB — HEMOGLOBIN A1C: A1c: 5.8

## 2013-12-20 LAB — PTH, INTACT: PTH Interp: 155

## 2013-12-20 LAB — CBC
HEMOGLOBIN: 12.3 g/dL
PLATELET COUNT: 320
WBC: 7.3

## 2013-12-20 LAB — COMPREHENSIVE METABOLIC PANEL
BUN: 49 mg/dL — AB (ref 4–21)
Creat: 2.21
GLUCOSE: 107 mg/dL
PHOSPHORUS: 4.6 mg/dL (ref 2.5–4.9)
Potassium: 4.9 mmol/L

## 2013-12-20 NOTE — Telephone Encounter (Signed)
Patient received your letter about her lab results.  She's calling back about the stool studies you asked about in the letter.  Patient is asking for you to call her back.

## 2013-12-20 NOTE — Telephone Encounter (Signed)
Spoke with patient and she advised that she used the Advair once and became very "drunk" feeling to the point she couldn't even drive. She says she thinks maybe the Advair was an "overtreatment" and that she really doesn't need anything just yet. Regardless, she isn't using it anymore because the feeling scared her. She also hasn't returned stool studies because she collected them and then couldn't get here to return them, so she is going to come pick up a new collection kit.

## 2013-12-21 DIAGNOSIS — R197 Diarrhea, unspecified: Secondary | ICD-10-CM | POA: Diagnosis not present

## 2013-12-21 NOTE — Telephone Encounter (Signed)
Patient called back today and forgot to tell me that her eyes turned very red after using the Advair as well. She also said that she went to have her hair washed today and the hairdresser asked her why her scalp was covered in red bumps. Patient said she wasn't aware they were there, but said she had noticed some itching, but nothing bad enough to concern her. She just wanted you to be aware.

## 2013-12-21 NOTE — Telephone Encounter (Signed)
Noted. Consider spiriva.

## 2013-12-21 NOTE — Telephone Encounter (Signed)
Noted. Would suggest eval if pt desires further evaluation of scalp rash.

## 2013-12-21 NOTE — Telephone Encounter (Signed)
She declined when I offered at this time.

## 2013-12-21 NOTE — Addendum Note (Signed)
Addended by: Ellamae Sia on: 12/21/2013 11:56 AM   Modules accepted: Orders

## 2013-12-22 ENCOUNTER — Emergency Department (HOSPITAL_COMMUNITY): Payer: Medicare Other

## 2013-12-22 ENCOUNTER — Encounter (HOSPITAL_COMMUNITY): Payer: Self-pay | Admitting: *Deleted

## 2013-12-22 ENCOUNTER — Other Ambulatory Visit: Payer: Self-pay

## 2013-12-22 ENCOUNTER — Emergency Department (HOSPITAL_COMMUNITY)
Admission: EM | Admit: 2013-12-22 | Discharge: 2013-12-22 | Disposition: A | Discharge status: Home / Self Care | Payer: Medicare Other | Attending: Emergency Medicine | Admitting: Emergency Medicine

## 2013-12-22 DIAGNOSIS — Z7982 Long term (current) use of aspirin: Secondary | ICD-10-CM | POA: Insufficient documentation

## 2013-12-22 DIAGNOSIS — Z87891 Personal history of nicotine dependence: Secondary | ICD-10-CM | POA: Diagnosis not present

## 2013-12-22 DIAGNOSIS — Z8719 Personal history of other diseases of the digestive system: Secondary | ICD-10-CM | POA: Insufficient documentation

## 2013-12-22 DIAGNOSIS — F329 Major depressive disorder, single episode, unspecified: Secondary | ICD-10-CM | POA: Diagnosis not present

## 2013-12-22 DIAGNOSIS — I129 Hypertensive chronic kidney disease with stage 1 through stage 4 chronic kidney disease, or unspecified chronic kidney disease: Secondary | ICD-10-CM | POA: Insufficient documentation

## 2013-12-22 DIAGNOSIS — Z8673 Personal history of transient ischemic attack (TIA), and cerebral infarction without residual deficits: Secondary | ICD-10-CM | POA: Diagnosis not present

## 2013-12-22 DIAGNOSIS — K219 Gastro-esophageal reflux disease without esophagitis: Secondary | ICD-10-CM | POA: Diagnosis not present

## 2013-12-22 DIAGNOSIS — F419 Anxiety disorder, unspecified: Secondary | ICD-10-CM | POA: Insufficient documentation

## 2013-12-22 DIAGNOSIS — J441 Chronic obstructive pulmonary disease with (acute) exacerbation: Secondary | ICD-10-CM | POA: Insufficient documentation

## 2013-12-22 DIAGNOSIS — Z79899 Other long term (current) drug therapy: Secondary | ICD-10-CM | POA: Diagnosis not present

## 2013-12-22 DIAGNOSIS — Z9889 Other specified postprocedural states: Secondary | ICD-10-CM | POA: Diagnosis not present

## 2013-12-22 DIAGNOSIS — Z90710 Acquired absence of both cervix and uterus: Secondary | ICD-10-CM | POA: Insufficient documentation

## 2013-12-22 DIAGNOSIS — Z8639 Personal history of other endocrine, nutritional and metabolic disease: Secondary | ICD-10-CM | POA: Diagnosis not present

## 2013-12-22 DIAGNOSIS — Z8742 Personal history of other diseases of the female genital tract: Secondary | ICD-10-CM | POA: Diagnosis not present

## 2013-12-22 DIAGNOSIS — G43909 Migraine, unspecified, not intractable, without status migrainosus: Secondary | ICD-10-CM | POA: Diagnosis not present

## 2013-12-22 DIAGNOSIS — Z9851 Tubal ligation status: Secondary | ICD-10-CM | POA: Insufficient documentation

## 2013-12-22 DIAGNOSIS — N184 Chronic kidney disease, stage 4 (severe): Secondary | ICD-10-CM | POA: Insufficient documentation

## 2013-12-22 DIAGNOSIS — Z8739 Personal history of other diseases of the musculoskeletal system and connective tissue: Secondary | ICD-10-CM | POA: Insufficient documentation

## 2013-12-22 DIAGNOSIS — R0602 Shortness of breath: Secondary | ICD-10-CM | POA: Diagnosis not present

## 2013-12-22 DIAGNOSIS — IMO0001 Reserved for inherently not codable concepts without codable children: Secondary | ICD-10-CM

## 2013-12-22 LAB — COMPREHENSIVE METABOLIC PANEL
ALBUMIN: 4.2 g/dL (ref 3.5–5.2)
ALT: 11 U/L (ref 0–35)
AST: 17 U/L (ref 0–37)
Alkaline Phosphatase: 80 U/L (ref 39–117)
Anion gap: 14 (ref 5–15)
BUN: 50 mg/dL — ABNORMAL HIGH (ref 6–23)
CO2: 24 mEq/L (ref 19–32)
CREATININE: 2.09 mg/dL — AB (ref 0.50–1.10)
Calcium: 9.6 mg/dL (ref 8.4–10.5)
Chloride: 97 mEq/L (ref 96–112)
GFR calc Af Amer: 25 mL/min — ABNORMAL LOW (ref >90)
GFR calc non Af Amer: 21 mL/min — ABNORMAL LOW (ref >90)
Glucose, Bld: 95 mg/dL (ref 70–99)
Potassium: 4.7 mEq/L (ref 3.7–5.3)
Sodium: 135 mEq/L — ABNORMAL LOW (ref 137–147)
Total Protein: 7.5 g/dL (ref 6.0–8.3)

## 2013-12-22 LAB — CBC WITH DIFFERENTIAL/PLATELET
BASOS ABS: 0.1 10*3/uL (ref 0.0–0.1)
BASOS PCT: 1 % (ref 0–1)
Eosinophils Absolute: 0.3 10*3/uL (ref 0.0–0.7)
Eosinophils Relative: 4 % (ref 0–5)
HEMATOCRIT: 35.4 % — AB (ref 36.0–46.0)
Hemoglobin: 11.5 g/dL — ABNORMAL LOW (ref 12.0–15.0)
LYMPHS PCT: 26 % (ref 12–46)
Lymphs Abs: 1.8 10*3/uL (ref 0.7–4.0)
MCH: 30.6 pg (ref 26.0–34.0)
MCHC: 32.5 g/dL (ref 30.0–36.0)
MCV: 94.1 fL (ref 78.0–100.0)
MONO ABS: 0.5 10*3/uL (ref 0.1–1.0)
Monocytes Relative: 7 % (ref 3–12)
NEUTROS PCT: 62 % (ref 43–77)
Neutro Abs: 4.3 10*3/uL (ref 1.7–7.7)
PLATELETS: 293 10*3/uL (ref 150–400)
RBC: 3.76 MIL/uL — ABNORMAL LOW (ref 3.87–5.11)
RDW: 12.7 % (ref 11.5–15.5)
WBC: 7 10*3/uL (ref 4.0–10.5)

## 2013-12-22 LAB — I-STAT TROPONIN, ED: Troponin i, poc: 0 ng/mL (ref 0.00–0.08)

## 2013-12-22 LAB — C. DIFFICILE GDH AND TOXIN A/B
C. DIFFICILE GDH: NOT DETECTED
C. difficile Toxin A/B: NOT DETECTED
SOURCE (C DIFF GDH AND TOXIN A/B): 0

## 2013-12-22 MED ORDER — FAMOTIDINE IN NACL 20-0.9 MG/50ML-% IV SOLN
20.0000 mg | Freq: Once | INTRAVENOUS | Status: AC
  Administered 2013-12-22: 20 mg via INTRAVENOUS
  Filled 2013-12-22: qty 50

## 2013-12-22 MED ORDER — GI COCKTAIL ~~LOC~~
30.0000 mL | Freq: Once | ORAL | Status: AC
  Administered 2013-12-22: 30 mL via ORAL
  Filled 2013-12-22: qty 30

## 2013-12-22 MED ORDER — DIPHENHYDRAMINE HCL 50 MG/ML IJ SOLN
25.0000 mg | Freq: Once | INTRAMUSCULAR | Status: AC
Start: 2013-12-22 — End: 2013-12-22
  Administered 2013-12-22: 25 mg via INTRAVENOUS
  Filled 2013-12-22: qty 1

## 2013-12-22 MED ORDER — METOCLOPRAMIDE HCL 5 MG/ML IJ SOLN
10.0000 mg | Freq: Once | INTRAMUSCULAR | Status: AC
  Administered 2013-12-22: 10 mg via INTRAVENOUS
  Filled 2013-12-22: qty 2

## 2013-12-22 MED ORDER — OMEPRAZOLE 20 MG PO CPDR: 20.0000 mg | DELAYED_RELEASE_CAPSULE | Freq: Every day | ORAL | Status: DC

## 2013-12-22 MED ORDER — SODIUM CHLORIDE 0.9 % IV BOLUS (SEPSIS)
1000.0000 mL | Freq: Once | INTRAVENOUS | Status: AC
Start: 2013-12-22 — End: 2013-12-22
  Administered 2013-12-22: 1000 mL via INTRAVENOUS

## 2013-12-22 MED ORDER — FAMOTIDINE 20 MG PO TABS: 20.0000 mg | ORAL_TABLET | Freq: Two times a day (BID) | ORAL | Status: DC | PRN

## 2013-12-22 NOTE — ED Provider Notes (Signed)
CSN: [id number]     Arrival date & time 12/22/13  1628 History   First MD Initiated Contact with Patient 12/22/13 1816     Chief Complaint  Patient presents with  . Allergic Reaction  . Shortness of Breath     (Consider location/radiation/quality/duration/timing/severity/associated sxs/prior Treatment) The history is provided by the patient.  Nicole English is a 80 y.o. female hx of HL, anxiety here with burning sensation in her chest. She states that she took one puff of Advair a week ago. Right afterwards she had a "drunk" feeling as well as some tingling in her tongue. She has persistent burning sensation in her chest that's not worse with food. She has called her doctor several times but didn't get any answer. Denies any rash or chest pain. Has associated headaches as well. No numbness or weakness.    Past Medical History  Diagnosis Date  . History of diverticulitis of colon   . HLD (hyperlipidemia)   . Anxiety   . Osteoporosis 08/2013    Spine -2.2, hip -3.5  . Depression   . GERD (gastroesophageal reflux disease)   . IBS (irritable bowel syndrome)   . Glucose intolerance (impaired glucose tolerance)   . Allergic rhinitis   . Migraine   . HTN (hypertension)     Dr. Martinique, cards  . SUI (stress urinary incontinence, female)     Dr. Amalia Hailey, urology (some urge as well)  . Diverticulosis     colonsocopy 2002 aborted 2/2 tortuous colon and heavy sigmoid diverticulosis  . Dysphagia     EGD 2012 - wnl, s/p esoph dilation  . CKD (chronic kidney disease) stage 4, GFR 15-29 ml/min 12/2012    per pt after brown recluse bite. sees Dr Juleen China, off ACEI, bp ok slightly elevated to maintain renal perfusion  . Diastolic dysfunction 0000000    per echo in November 2012; normal EF  . DDD (degenerative disc disease), lumbar   . 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 History of CVA (cerebrovascular accident) 2012     old per prior head CT   Past Surgical History  Procedure Laterality Date  . Tonsillectomy  1942  . Submucous resection  1962  . Tubal ligation  1973  . Breast enhancement surgery  1982  . Total abdominal hysterectomy  1996    for frequent dysmennorhea  . Macroplastique implantation  03/2008    Dr. Rogers Blocker  . Uretherolysis and removal of macroplastique  09/13/08    Dr. Amalia Hailey  . Ct head limited w/o cm  04/2010    nothing acute, ? old infarcts L internal capsule  . Renal doppler  2009?    simple cysts, wnl  . Colonoscopy  2002    does not want another!  . Esophagogastroduodenoscopy  2012    WNL, s/p esoph dilation (Dr. Candace Cruise, [facility])  . US echocardiography  11/2010    EF 55-60%, mild LAE, mild diastolic dysfunction, normal valves  . Dexa  08/2013    Spine -2.2, hip -3.5   Family History  Problem Relation Age of Onset  . Heart failure Father   . Heart disease Father   . Cancer Father     prostate  . Hypertension Father   . Coronary artery disease Father   . Leukemia Mother     CLL prior   History  Substance Use Topics  . Smoking status: Former Smoker --  0.50 packs/day for 54 years    Types: Cigarettes    Start date: 01/18/1954    Quit date: 01/19/2008  . Smokeless tobacco: Never Used  . Alcohol Use: Yes     Comment: 1 1/2-2 glasses white wine/day   OB History    No data available     Review of Systems  Respiratory: Positive for shortness of breath.   All other systems reviewed and are negative.     Allergies  Amlodipine; Doxycycline monohydrate; and Ramipril  Home Medications   Prior to Admission medications   Medication Sig Start Date End Date Taking? Authorizing Provider  acetaminophen (TYLENOL) 500 MG tablet Take 500 mg by mouth 2 (two) times daily as needed for pain.   Yes Historical Provider, MD  albuterol (PROVENTIL HFA;VENTOLIN HFA) 108 (90 BASE) MCG/ACT inhaler Inhale 1-2 puffs into the lungs every 6 (six) hours as needed for wheezing or shortness  of breath. 12/12/13  Yes Ria Bush, MD  ALPRAZolam Duanne Moron) 0.25 MG tablet Take 0.25 mg by mouth 2 (two) times daily as needed for anxiety.   Yes Historical Provider, MD  aspirin EC 81 MG tablet Take 81 mg by mouth daily.   Yes Historical Provider, MD  calcitRIOL (ROCALTROL) 0.25 MCG capsule Take 0.25 mcg by mouth every Monday, Wednesday, and Friday. M,W,F   Yes Historical Provider, MD  cholecalciferol (VITAMIN D) 1000 UNITS tablet Take 1,000 Units by mouth daily.   Yes Historical Provider, MD  furosemide (LASIX) 20 MG tablet Take 20 mg by mouth 2 (two) times daily.   Yes Historical Provider, MD  gabapentin (NEURONTIN) 100 MG capsule Take 1 (one) capsule morning and noon and 2 (two) capsules at bedtime. Patient taking differently: Take 100-200 mg by mouth 3 (three) times daily. Take 1 (one) capsule morning and noon and 2 (two) capsules at bedtime. 12/10/13  Yes Ria Bush, MD  HYDROcodone-acetaminophen (NORCO/VICODIN) 5-325 MG per tablet Take 1 tablet by mouth 2 (two) times daily as needed for moderate pain. 12/11/13  Yes Ria Bush, MD  nebivolol (BYSTOLIC) 5 MG tablet Take 0.5 tablets (2.5 mg total) by mouth daily. Patient taking differently: Take 5 mg by mouth daily.  12/07/13  Yes Peter M Martinique, MD  [place] (OYSTER CALCIUM) 500 MG TABS tablet Take 500 mg of elemental calcium by mouth daily. SEA CALCIUM   Yes Historical Provider, MD  sertraline (ZOLOFT) 25 MG tablet Take 1 tablet (25 mg total) by mouth daily. Patient taking differently: Take 12.5 mg by mouth daily.  09/19/13  Yes Ria Bush, MD  zolpidem (AMBIEN) 5 MG tablet TAKE ONE TABLET BY MOUTH AT BEDTIME 12/11/13  Yes Ria Bush, MD  ALPRAZolam Duanne Moron) 0.25 MG tablet TAKE ONE TABLET BY MOUTH TWICE DAILY AS NEEDED Patient not taking: Reported on 12/22/2013 12/12/13   Ria Bush, MD  Fluticasone-Salmeterol (ADVAIR DISKUS) 100-50 MCG/DOSE AEPB Inhale 1 puff into the lungs daily. Patient not taking:  Reported on 12/22/2013 12/12/13   Ria Bush, MD   BP 115/63 mmHg  Pulse 59  Temp(Src) 97.3 F (36.3 C) (Oral)  Resp 15  SpO2 98% Physical Exam  Constitutional: She is oriented to person, place, and time. She appears well-nourished.  Anxious   HENT:  Head: Normocephalic.  Mouth/Throat: Oropharynx is clear and moist.  No tongue swelling. OP clear. No lip swelling   Eyes: Conjunctivae and EOM are normal. Pupils are equal, round, and reactive to light.  Neck: Neck supple.  Cardiovascular: Normal rate, regular rhythm and normal  heart sounds.   Pulmonary/Chest: Effort normal and breath sounds normal. No respiratory distress. She has no wheezes. She has no rales. She exhibits no tenderness.  Abdominal: Soft. Bowel sounds are normal. She exhibits no distension. There is no tenderness. There is no rebound and no guarding.  Musculoskeletal: Normal range of motion. She exhibits no edema or tenderness.  Neurological: She is alert and oriented to person, place, and time. No cranial nerve deficit. Coordination normal.  Skin: Skin is warm.  Psychiatric: She has a normal mood and affect. Her behavior is normal. Judgment and thought content normal.  Nursing note and vitals reviewed.   ED Course  Procedures (including critical care time) Labs Review Labs Reviewed  CBC WITH DIFFERENTIAL - Abnormal; Notable for the following:    RBC 3.76 (*)    Hemoglobin 11.5 (*)    HCT 35.4 (*)    All other components within normal limits  COMPREHENSIVE METABOLIC PANEL - Abnormal; Notable for the following:    Sodium 135 (*)    BUN 50 (*)    Creatinine, Ser 2.09 (*)    Total Bilirubin <0.2 (*)    GFR calc non Af Amer 21 (*)    GFR calc Af Amer 25 (*)    All other components within normal limits  Randolm Idol, ED    Imaging Review Dg Chest 2 View  12/22/2013   CLINICAL DATA:  80 year old female with shortness of breath on exertion.  EXAM: CHEST  2 VIEW  COMPARISON:  Chest x-ray 09/03/2013.   FINDINGS: Mild diffuse peribronchial cuffing, similar to prior studies. Lung volumes are normal. No consolidative airspace disease. No pleural effusions. No pneumothorax. No pulmonary nodule or mass noted. Pulmonary vasculature and the cardiomediastinal silhouette are within normal limits. Atherosclerosis in the thoracic aorta. Bilateral breast implants (left implant is heavily calcified).  IMPRESSION: 1. No radiographic evidence of acute cardiopulmonary disease. 2. Mild diffuse peribronchial cuffing, similar to prior examinations, favored to reflect chronic bronchitis 3. Atherosclerosis.   Electronically Signed   By: Vinnie Langton M.D.   On: 12/22/2013 19:29     EKG Interpretation   Date/Time:  Saturday December 22 2013 16:39:58 EST Ventricular Rate:  53 PR Interval:  184 QRS Duration: 72 QT Interval:  420 QTC Calculation: 394 R Axis:   81 Text Interpretation:  Sinus bradycardia Otherwise normal ECG No  significant change since last tracing Confirmed by YAO  MD, DAVID ([id number])  on 12/22/2013 6:16:33 PM      MDM   Final diagnoses:  Shortness of breath    Nicole English is a 80 y.o. female here with burning sensation in tongue and chest. I doubt that its from the advair. No signs of allergic reaction or angioedema. I think likely reflux that is exacerbated by anxiety. I doubt ACS. Will check labs, CXR. I reassured patient extensively.   8:52 PM Felt better after GI cocktail, pepcid. Labs and CXR stable. Will d/c home with prilosec, pepcid.   Wandra Arthurs, MD 12/22/13 [phone]

## 2013-12-22 NOTE — ED Notes (Signed)
Pt reports being prescribed advair due to sob on exertion. Pt took one puff of advair last week and then reports not feeling well since. Reports feeling "drunk" had swelling of lips and tongue, which has since resolved. Reports rash to head and "burning sensation to her lungs." also still having sob. Airway intact at triage.

## 2013-12-22 NOTE — ED Notes (Signed)
EDP at bedside  

## 2013-12-22 NOTE — Discharge Instructions (Signed)
Don't take advair.   Take prilosec as prescribed.   Take pepcid as needed.   Can take over the counter maalox as needed.   Follow up with your doctor.   Return to ER if you have shortness of breath, chest pain, worse burning feeling.

## 2013-12-24 ENCOUNTER — Telehealth: Payer: Self-pay

## 2013-12-24 NOTE — Telephone Encounter (Signed)
PLEASE NOTE: All timestamps contained within this report are represented as [place] Standard Time. CONFIDENTIALTY NOTICE: This fax transmission is intended only for the addressee. It contains information that is legally privileged, confidential or otherwise protected from use or disclosure. If you are not the intended recipient, you are strictly prohibited from reviewing, disclosing, copying using or disseminating any of this information or taking any action in reliance on or regarding this information. If you have received this fax in error, please notify us immediately by telephone so that we can arrange for its return to [place]. Phone: [phone], Toll-Free: [phone], Fax: [phone] Page: 1 of 2 Call Id: [id number] [place] Patient Name: Nicole English Gender: Female DOB: [birth year] Age: 80 Y 43 M 3 D Return Phone Number: [phone] (Primary) Address: City/State/Zip: [place] Client [place] Night - Client Client Site [place] Physician Ria Bush Contact Type Call Call Type Triage / Clinical Relationship To Patient Self Return Phone Number [phone] (Primary) Chief Complaint BREATHING - shortness of breath or sounds breathless Initial Comment [name] states having an allergic reaction to Advair, been going on for several days. Got very dizzy after taking it, tongue and lips something wrong, breathing trouble PreDisposition InappropriateToAsk Nurse Assessment Nurse: Glennon Mac, RN, Amber Date/Time (Eastern Time): 12/22/2013 3:33:32 PM Confirm and document reason for call. If symptomatic, describe symptoms. ---[name] states having an allergic reaction to Advair, been going on for several days. Got very dizzy after taking it, tongue and lips something wrong, breathing trouble. States her mouth feels raw. Has the patient  traveled out of the country within the last 30 days? ---No Does the patient require triage? ---Yes Related visit to physician within the last 2 weeks? ---No Does the PT have any chronic conditions? (i.e. diabetes, asthma, etc.) ---No Guidelines Guideline Title Affirmed Question Affirmed Notes Nurse Date/Time (Eastern Time) Breathing Difficulty [1] MODERATE difficulty breathing (e.g., speaks in phrases, SOB even at rest, pulse 100-120) AND [2] NEWonset or WORSE than normal Glennon Mac, Therapist, sports, [employer] 12/22/2013 3:36:37 PM Disp. Time Eilene Ghazi Time) Disposition Final User 12/22/2013 3:31:23 PM Send to Urgent Queue Fransico Michael 12/22/2013 3:38:20 PM Go to ED Now Yes Glennon Mac, RN, Amber PLEASE NOTE: All timestamps contained within this report are represented as [place] Standard Time. CONFIDENTIALTY NOTICE: This fax transmission is intended only for the addressee. It contains information that is legally privileged, confidential or otherwise protected from use or disclosure. If you are not the intended recipient, you are strictly prohibited from reviewing, disclosing, copying using or disseminating any of this information or taking any action in reliance on or regarding this information. If you have received this fax in error, please notify us immediately by telephone so that we can arrange for its return to [place]. Phone: [phone], Toll-Free: [phone], Fax: [phone] Page: 2 of 2 Call Id: [id number] [name] Understands: Yes Disagree/Comply: Comply Care Advice Given Per Guideline GO TO ED NOW: You need to be seen in the Emergency Department. Go to the ER at ___________ [place] now. Drive carefully. NOTE TO TRIAGER - DRIVING: * Another adult should drive. BRING MEDICINES: * Please bring a list of your current medicines when you go to the Emergency Department (ER). * It is also a good idea to bring the pill bottles too. This will help the doctor to make certain you are taking the right  medicines and the right dose. CALL EMS 911  IF: you become worse. CARE ADVICE given per Breathing Difficulty (Adult) guideline. Referrals [facility] - ED

## 2013-12-25 LAB — STOOL CULTURE

## 2013-12-25 NOTE — Telephone Encounter (Signed)
Noted. ER note reviewed. Dx reflux exacerbated by anxiety.

## 2014-01-02 ENCOUNTER — Encounter: Payer: Self-pay | Admitting: *Deleted

## 2014-01-04 ENCOUNTER — Other Ambulatory Visit: Payer: Self-pay | Admitting: Family Medicine

## 2014-01-04 NOTE — Telephone Encounter (Signed)
rx called into pharmacy

## 2014-01-04 NOTE — Telephone Encounter (Signed)
Plz phone in alprazolam and [place]

## 2014-01-07 ENCOUNTER — Other Ambulatory Visit: Payer: Self-pay

## 2014-01-07 MED ORDER — HYDROCODONE-ACETAMINOPHEN 5-325 MG PO TABS: 1.0000 | ORAL_TABLET | Freq: Two times a day (BID) | ORAL | Status: DC | PRN

## 2014-01-07 NOTE — Telephone Encounter (Signed)
Pt left v/m requesting rx hydrocodone apap. Call when ready for pick up. Dr Danise [name] out of office this week.

## 2014-01-07 NOTE — Telephone Encounter (Signed)
Left voicemail letting pt know Rx ready for pick up 

## 2014-01-07 NOTE — Telephone Encounter (Signed)
Px printed for pick up in IN box  

## 2014-01-08 ENCOUNTER — Encounter: Payer: Self-pay | Admitting: Family Medicine

## 2014-01-08 DIAGNOSIS — Z79891 Long term (current) use of opiate analgesic: Secondary | ICD-10-CM | POA: Diagnosis not present

## 2014-01-08 DIAGNOSIS — Z79899 Other long term (current) drug therapy: Secondary | ICD-10-CM | POA: Diagnosis not present

## 2014-01-14 ENCOUNTER — Encounter: Payer: Self-pay | Admitting: Family Medicine

## 2014-01-25 ENCOUNTER — Encounter: Payer: Self-pay | Admitting: Family Medicine

## 2014-02-12 ENCOUNTER — Other Ambulatory Visit: Payer: Self-pay | Admitting: Family Medicine

## 2014-02-12 MED ORDER — HYDROCODONE-ACETAMINOPHEN 5-325 MG PO TABS: 1.0000 | ORAL_TABLET | Freq: Two times a day (BID) | ORAL | Status: DC | PRN

## 2014-02-12 NOTE — Telephone Encounter (Signed)
Rx printed and signed by Dr. Darnell Level. Given to Amber to give to patient.

## 2014-02-12 NOTE — Telephone Encounter (Signed)
Printed and in Kim's box 

## 2014-02-12 NOTE — Telephone Encounter (Signed)
Pt left v/m requesting rx hydrocodone apap. Call when ready for pick up. Pt request to pick up rx today; pt has not been able to get out of driveway until today and pt only has one day of hydrocodone apap left.

## 2014-02-21 DIAGNOSIS — I5030 Unspecified diastolic (congestive) heart failure: Secondary | ICD-10-CM | POA: Diagnosis not present

## 2014-02-21 DIAGNOSIS — N2581 Secondary hyperparathyroidism of renal origin: Secondary | ICD-10-CM | POA: Diagnosis not present

## 2014-02-21 DIAGNOSIS — E875 Hyperkalemia: Secondary | ICD-10-CM | POA: Diagnosis not present

## 2014-02-21 DIAGNOSIS — I129 Hypertensive chronic kidney disease with stage 1 through stage 4 chronic kidney disease, or unspecified chronic kidney disease: Secondary | ICD-10-CM | POA: Diagnosis not present

## 2014-02-21 DIAGNOSIS — D631 Anemia in chronic kidney disease: Secondary | ICD-10-CM | POA: Diagnosis not present

## 2014-02-21 DIAGNOSIS — N184 Chronic kidney disease, stage 4 (severe): Secondary | ICD-10-CM | POA: Diagnosis not present

## 2014-03-01 ENCOUNTER — Ambulatory Visit (INDEPENDENT_AMBULATORY_CARE_PROVIDER_SITE_OTHER): Payer: Medicare Other | Admitting: Family Medicine

## 2014-03-01 ENCOUNTER — Telehealth: Payer: Self-pay

## 2014-03-01 ENCOUNTER — Encounter: Payer: Self-pay | Admitting: Family Medicine

## 2014-03-01 VITALS — BP 128/70 | HR 60 | Temp 97.7°F | Wt 126.5 lb

## 2014-03-01 DIAGNOSIS — R197 Diarrhea, unspecified: Secondary | ICD-10-CM

## 2014-03-01 MED ORDER — ALIGN 4 MG PO CAPS: 1.0000 | ORAL_CAPSULE | Freq: Every day | ORAL | Status: DC

## 2014-03-01 NOTE — Assessment & Plan Note (Signed)
Benign exam today - actually seems to be turning the corner over last 24 hours. Reviewed recent labwork including stool studies - all stable. ?IBS related - will start align 4mg  daily and have patient update [place] with effect in 2 wks. If persistent, would want repeat CBC, CMP, consider stool studies and consider empiric treatment with antibiotic. Pt agrees with plan.

## 2014-03-01 NOTE — Telephone Encounter (Signed)
Message left for patient to return my call.  

## 2014-03-01 NOTE — Progress Notes (Signed)
BP 128/70 mmHg  Pulse 60  Temp(Src) 97.7 F (36.5 C) (Oral)  Wt 126 lb 8 oz (57.38 kg)   CC: diarrhea  Subjective:    Patient ID: Nicole English, female    DOB: Jan 08, 1933, 81 y.o.   MRN: [id number]  HPI: Nicole English is a 81 y.o. female presenting on 03/01/2014 for Diarrhea   Persistent intermittent diarrhea since October. Some RLQ soreness. Last check 12/12/2013, CBC, CMP, stool studies (C diff and culture) were unrevealing. She is on no meds that should cause diarrhea. Diarrhea did resolve early December, but then restarted right after Christmas. Endorses several episodes of loose stool daily. No constipation. + abd soreness with diarrhea. Never any fevers with this. No new foods, no recent travel. Uses bottled water. No other sick contacts with diarrhea. Recurrent episode has lasted another 6 wks. She has had normal stools for the last 30 hours. No blood in stool. No mucous in stool. No clay colored or pale stool.  No fevers/chills, no recent abx use.   Known CKD stage 4 followed by Dr Juleen China. Known significant sigmoid diverticulosis. Has never had diverticulitis.  Known irritable bowel syndrome - related to stress. Denies current stressors but she has been worried about hackers recently.  Lab Results  Component Value Date   HGBA1C 5.6 10/02/2008    Relevant past medical, surgical, family and social history reviewed and updated as indicated. Interim medical history since our last visit reviewed. Allergies and medications reviewed and updated. Current Outpatient Prescriptions on File Prior to Visit  Medication Sig  . acetaminophen (TYLENOL) 500 MG tablet Take 500 mg by mouth 2 (two) times daily as needed for pain.  Marland Kitchen ALPRAZolam (XANAX) 0.25 MG tablet TAKE ONE TABLET BY MOUTH TWICE DAILY AS NEEDED  . aspirin EC 81 MG tablet Take 81 mg by mouth daily.  . calcitRIOL (ROCALTROL) 0.25 MCG capsule Take 0.75 mcg by mouth every Monday, Wednesday, and Friday. M,W,F  .  cholecalciferol (VITAMIN D) 1000 UNITS tablet Take 1,000 Units by mouth daily.  . furosemide (LASIX) 20 MG tablet TAKE ONE TABLET BY MOUTH TWICE DAILY  . gabapentin (NEURONTIN) 100 MG capsule TAKE ONE CAPSULE BY MOUTH IN THE MORNING AND AT NOON, THEN TAKE TWO CAPSULES BY MOUTH AT BEDTIME  . HYDROcodone-acetaminophen (NORCO/VICODIN) 5-325 MG per tablet Take 1 tablet by mouth 2 (two) times daily as needed for moderate pain.  . nebivolol (BYSTOLIC) 5 MG tablet Take 0.5 tablets (2.5 mg total) by mouth daily.  Marland Kitchen omeprazole (PRILOSEC) 20 MG capsule Take 1 capsule (20 mg total) by mouth daily. (Patient taking differently: Take 20 mg by mouth daily as needed. )  . Oyster Shell (OYSTER CALCIUM) 500 MG TABS tablet Take 500 mg of elemental calcium by mouth daily. SEA CALCIUM  . sertraline (ZOLOFT) 25 MG tablet Take 1 tablet (25 mg total) by mouth daily. (Patient taking differently: Take 12.5 mg by mouth daily. )  . zolpidem (AMBIEN) 5 MG tablet TAKE ONE TABLET BY MOUTH AT BEDTIME  . famotidine (PEPCID) 20 MG tablet Take 1 tablet (20 mg total) by mouth 2 (two) times daily as needed for heartburn or indigestion. (Patient not taking: Reported on 03/01/2014)   No current facility-administered medications on file prior to visit.    Review of Systems Per HPI unless specifically indicated above     Objective:    BP 128/70 mmHg  Pulse 60  Temp(Src) 97.7 F (36.5 C) (Oral)  Wt 126 lb 8 oz (57.38  kg)  Wt Readings from Last 3 Encounters:  03/01/14 126 lb 8 oz (57.38 kg)  12/12/13 126 lb 8 oz (57.38 kg)  12/07/13 128 lb 8 oz (58.287 kg)    Physical Exam  Constitutional: She appears well-developed and well-nourished. No distress.  HENT:  Mouth/Throat: Oropharynx is clear and moist. No oropharyngeal exudate.  Cardiovascular: Normal rate, regular rhythm, normal heart sounds and intact distal pulses.   No murmur heard. Pulmonary/Chest: Effort normal and breath sounds normal. No respiratory distress. She has  no wheezes. She has no rales.  Abdominal: Soft. Normal appearance and bowel sounds are normal. She exhibits no distension and no mass. There is no hepatosplenomegaly. There is tenderness (mild) in the right upper quadrant. There is no rigidity, no rebound, no guarding, no CVA tenderness and negative Murphy's sign.  Musculoskeletal: She exhibits no edema.  Nursing note and vitals reviewed.      Assessment & Plan:   Problem List Items Addressed This Visit    Diarrhea - Primary    Benign exam today - actually seems to be turning the corner over last 24 hours. Reviewed recent labwork including stool studies - all stable. ?IBS related - will start align 4mg  daily and have patient update [place] with effect in 2 wks. If persistent, would want repeat CBC, CMP, consider stool studies and consider empiric treatment with antibiotic. Pt agrees with plan.          Follow up plan: Return if symptoms worsen or fail to improve.

## 2014-03-01 NOTE — Telephone Encounter (Signed)
Pt left v/m; pt got call from pharmacy that probiotics were over $100.00 and pt wants to know if that is for prolonged period of time. Left v/m requesting pt to cb.

## 2014-03-01 NOTE — Progress Notes (Signed)
Pre visit review using our clinic review tool, if applicable. No additional management support is needed unless otherwise documented below in the visit note. 

## 2014-03-01 NOTE — Patient Instructions (Signed)
I think this may be partly irritable bowels and partly poor gut flora. Start probiotic align once daily - sent to pharmacy (but it may be over the counter). After 2 weeks of daily align, call us with an update to see how diarrhea is doing. If persistent diarrhea, return for labwork and possible repeat stool studies.

## 2014-03-01 NOTE — Telephone Encounter (Signed)
Pt left another v/m that she got the wrong info from her pharmacy; left v/m requesting cb.

## 2014-03-04 NOTE — Telephone Encounter (Signed)
Spoke with patient and she had gotten clarification over the weekend.

## 2014-03-08 ENCOUNTER — Other Ambulatory Visit: Payer: Self-pay | Admitting: Family Medicine

## 2014-03-08 NOTE — Telephone Encounter (Signed)
Rx called in as directed.   

## 2014-03-08 NOTE — Telephone Encounter (Signed)
plz phone in. 

## 2014-03-11 ENCOUNTER — Other Ambulatory Visit: Payer: Self-pay | Admitting: Family Medicine

## 2014-03-11 DIAGNOSIS — Z1231 Encounter for screening mammogram for malignant neoplasm of breast: Secondary | ICD-10-CM

## 2014-03-13 ENCOUNTER — Other Ambulatory Visit: Payer: Self-pay | Admitting: Family Medicine

## 2014-03-13 MED ORDER — HYDROCODONE-ACETAMINOPHEN 5-325 MG PO TABS: 1.0000 | ORAL_TABLET | Freq: Two times a day (BID) | ORAL | Status: DC | PRN

## 2014-03-13 NOTE — Telephone Encounter (Signed)
plz phone in. 

## 2014-03-13 NOTE — Telephone Encounter (Signed)
Patient notified and Rx placed up for pick up.

## 2014-03-13 NOTE — Telephone Encounter (Signed)
Printed and in Kim's box 

## 2014-03-13 NOTE — Telephone Encounter (Signed)
Pt left note requesting rx hydrocodone apap. Call when ready for pick up; pt last seen discuss meds 09/19/13 and last sick visit 03/01/14.

## 2014-03-13 NOTE — Telephone Encounter (Signed)
Ok to refill 

## 2014-03-13 NOTE — Telephone Encounter (Signed)
Rx called in as directed.   

## 2014-03-19 ENCOUNTER — Emergency Department (HOSPITAL_COMMUNITY): Payer: Medicare Other

## 2014-03-19 ENCOUNTER — Emergency Department (HOSPITAL_COMMUNITY)
Admission: EM | Admit: 2014-03-19 | Discharge: 2014-03-19 | Disposition: A | Discharge status: Home / Self Care | Payer: Medicare Other | Attending: Emergency Medicine | Admitting: Emergency Medicine

## 2014-03-19 ENCOUNTER — Telehealth: Payer: Self-pay | Admitting: Family Medicine

## 2014-03-19 ENCOUNTER — Encounter (HOSPITAL_COMMUNITY): Payer: Self-pay | Admitting: Neurology

## 2014-03-19 DIAGNOSIS — Z8673 Personal history of transient ischemic attack (TIA), and cerebral infarction without residual deficits: Secondary | ICD-10-CM | POA: Diagnosis not present

## 2014-03-19 DIAGNOSIS — M1990 Unspecified osteoarthritis, unspecified site: Secondary | ICD-10-CM | POA: Insufficient documentation

## 2014-03-19 DIAGNOSIS — R0602 Shortness of breath: Secondary | ICD-10-CM | POA: Diagnosis not present

## 2014-03-19 DIAGNOSIS — J159 Unspecified bacterial pneumonia: Secondary | ICD-10-CM | POA: Diagnosis not present

## 2014-03-19 DIAGNOSIS — J189 Pneumonia, unspecified organism: Secondary | ICD-10-CM | POA: Diagnosis not present

## 2014-03-19 DIAGNOSIS — F329 Major depressive disorder, single episode, unspecified: Secondary | ICD-10-CM | POA: Diagnosis not present

## 2014-03-19 DIAGNOSIS — J441 Chronic obstructive pulmonary disease with (acute) exacerbation: Secondary | ICD-10-CM | POA: Diagnosis not present

## 2014-03-19 DIAGNOSIS — F419 Anxiety disorder, unspecified: Secondary | ICD-10-CM | POA: Insufficient documentation

## 2014-03-19 DIAGNOSIS — G43909 Migraine, unspecified, not intractable, without status migrainosus: Secondary | ICD-10-CM | POA: Diagnosis not present

## 2014-03-19 DIAGNOSIS — I129 Hypertensive chronic kidney disease with stage 1 through stage 4 chronic kidney disease, or unspecified chronic kidney disease: Secondary | ICD-10-CM | POA: Insufficient documentation

## 2014-03-19 DIAGNOSIS — Z7982 Long term (current) use of aspirin: Secondary | ICD-10-CM | POA: Diagnosis not present

## 2014-03-19 DIAGNOSIS — R079 Chest pain, unspecified: Secondary | ICD-10-CM | POA: Diagnosis not present

## 2014-03-19 DIAGNOSIS — K219 Gastro-esophageal reflux disease without esophagitis: Secondary | ICD-10-CM | POA: Diagnosis not present

## 2014-03-19 DIAGNOSIS — N184 Chronic kidney disease, stage 4 (severe): Secondary | ICD-10-CM | POA: Insufficient documentation

## 2014-03-19 DIAGNOSIS — Z8639 Personal history of other endocrine, nutritional and metabolic disease: Secondary | ICD-10-CM | POA: Insufficient documentation

## 2014-03-19 DIAGNOSIS — Z87891 Personal history of nicotine dependence: Secondary | ICD-10-CM | POA: Diagnosis not present

## 2014-03-19 DIAGNOSIS — R05 Cough: Secondary | ICD-10-CM | POA: Diagnosis present

## 2014-03-19 LAB — CBC
HCT: 37.5 % (ref 36.0–46.0)
HEMOGLOBIN: 11.8 g/dL — AB (ref 12.0–15.0)
MCH: 31.1 pg (ref 26.0–34.0)
MCHC: 31.5 g/dL (ref 30.0–36.0)
MCV: 98.9 fL (ref 78.0–100.0)
Platelets: 303 10*3/uL (ref 150–400)
RBC: 3.79 MIL/uL — ABNORMAL LOW (ref 3.87–5.11)
RDW: 13.3 % (ref 11.5–15.5)
WBC: 7 10*3/uL (ref 4.0–10.5)

## 2014-03-19 LAB — BASIC METABOLIC PANEL
ANION GAP: 10 (ref 5–15)
BUN: 40 mg/dL — ABNORMAL HIGH (ref 6–23)
CALCIUM: 9.6 mg/dL (ref 8.4–10.5)
CO2: 23 mmol/L (ref 19–32)
Chloride: 105 mmol/L (ref 96–112)
Creatinine, Ser: 2.11 mg/dL — ABNORMAL HIGH (ref 0.50–1.10)
GFR calc Af Amer: 25 mL/min — ABNORMAL LOW (ref >90)
GFR, EST NON AFRICAN AMERICAN: 21 mL/min — AB (ref >90)
GLUCOSE: 110 mg/dL — AB (ref 70–99)
Potassium: 5.2 mmol/L — ABNORMAL HIGH (ref 3.5–5.1)
SODIUM: 138 mmol/L (ref 135–145)

## 2014-03-19 LAB — I-STAT TROPONIN, ED: TROPONIN I, POC: 0 ng/mL (ref 0.00–0.08)

## 2014-03-19 MED ORDER — ALBUTEROL SULFATE HFA 108 (90 BASE) MCG/ACT IN AERS
2.0000 | INHALATION_SPRAY | RESPIRATORY_TRACT | Status: DC
  Administered 2014-03-19: 2 via RESPIRATORY_TRACT
  Filled 2014-03-19: qty 6.7

## 2014-03-19 MED ORDER — LEVOFLOXACIN 500 MG PO TABS
500.0000 mg | ORAL_TABLET | Freq: Once | ORAL | Status: AC
  Administered 2014-03-19: 500 mg via ORAL
  Filled 2014-03-19 (×2): qty 1

## 2014-03-19 MED ORDER — LEVOFLOXACIN 250 MG PO TABS: 250.0000 mg | ORAL_TABLET | Freq: Every day | ORAL | Status: DC

## 2014-03-19 NOTE — ED Notes (Signed)
Patient transported to X-ray 

## 2014-03-19 NOTE — ED Notes (Signed)
Pharmacy called; they are working on sending the medication.

## 2014-03-19 NOTE — Discharge Instructions (Signed)

## 2014-03-19 NOTE — Telephone Encounter (Signed)
[facility]     Patient Name: Nicole English Initial Comment [name] states thinks she may have pneumonia. She's experiencing fatigue, sore back. Coughing up phlegm.   DOB: [birth year]      Nurse Assessment  Nurse: Venetia Maxon, RN, Manuela Schwartz Date/Time (Eastern Time): 03/19/2014 3:30:09 PM  Confirm and document reason for call. If symptomatic, describe symptoms. ---[name] states thinks she may have pneumonia. She's experiencing fatigue, sore thoracic on right side back. Coughing up phlegm. Cough X 1 wk. Current temp is no thermometer. Has urinated.  Has the patient traveled out of the country within the last 30 days? ---No  Does the patient require triage? ---Yes  Related visit to physician within the last 2 weeks? ---No  Does the PT have any chronic conditions? (i.e. diabetes, asthma, etc.) ---Yes  List chronic conditions. ---HTN COPD used one inhaler. and it sent her to the ED    Guidelines     Guideline Title Affirmed Question Affirmed Notes   Cough - Acute Productive Difficulty breathing    Final Disposition User   Go to ED Now Venetia Maxon, RN, Manuela Schwartz

## 2014-03-19 NOTE — ED Notes (Signed)
Notified pharmacy patient is ready for discharge and to please send Levaquin.

## 2014-03-19 NOTE — Telephone Encounter (Signed)
Would suggest UCC eval not ER eval. If no dyspnea or acute distress would suggest eval in office tomorrow

## 2014-03-19 NOTE — ED Notes (Signed)
Patient returned from X-ray 

## 2014-03-19 NOTE — ED Notes (Signed)
Pt reports cough with productive yellow sputum for several weeks, states she feels sob. Also has sore spot to left middle back when coughing. Denies fever, or CP. Pt is a x 4. Has COPD

## 2014-03-19 NOTE — Telephone Encounter (Signed)
Message left notifying patient.

## 2014-03-19 NOTE — ED Provider Notes (Signed)
CSN: [id number]     Arrival date & time 03/19/14  1622 History   First MD Initiated Contact with Patient 03/19/14 1644     Chief Complaint  Patient presents with  . Shortness of Breath  . Cough      HPI Pt presents to the emergency department complaining of productive cough x 5 days with some SOB. Hx of COPD. denies chest pain.  No unilateral leg swelling.  No history DVT or pulmonary embolism.  She no longer smokes cigarettes.  She called her primary care physician and it was recommended that she come to the ER for evaluation.  No exertional shortness breath.  Denies orthopnea.  Denies lower extremity edema.  No history of congestive heart failure.  Symptoms are mild in severity.   Past Medical History  Diagnosis Date  . History of diverticulitis of colon   . HLD (hyperlipidemia)   . Anxiety   . Osteoporosis 08/2013    Spine -2.2, hip -3.5  . Depression   . GERD (gastroesophageal reflux disease)   . IBS (irritable bowel syndrome)   . Glucose intolerance (impaired glucose tolerance)   . Allergic rhinitis   . Migraine   . HTN (hypertension)     Dr. Martinique, cards  . SUI (stress urinary incontinence, female)     Dr. Amalia Hailey, urology (some urge as well)  . Diverticulosis     colonsocopy 2002 aborted 2/2 tortuous colon and heavy sigmoid diverticulosis  . Dysphagia     EGD 2012 - wnl, s/p esoph dilation  . CKD (chronic kidney disease) stage 4, GFR 15-29 ml/min 12/2012    per pt after brown recluse bite. sees Dr Juleen China, off ACEI, bp ok slightly elevated to maintain renal perfusion  . Diastolic dysfunction 0000000    per echo in November 2012; normal EF  . DDD (degenerative disc disease), lumbar   . 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 History of CVA (cerebrovascular accident) 2012    old per prior head CT   Past Surgical History  Procedure Laterality Date  . Tonsillectomy  1942  . Submucous resection   1962  . Tubal ligation  1973  . Breast enhancement surgery  1982  . Total abdominal hysterectomy  1996    for frequent dysmennorhea  . Macroplastique implantation  03/2008    Dr. Rogers Blocker  . Uretherolysis and removal of macroplastique  09/13/08    Dr. Amalia Hailey  . Ct head limited w/o cm  04/2010    nothing acute, ? old infarcts L internal capsule  . Renal doppler  2009?    simple cysts, wnl  . Colonoscopy  2002    does not want another!  . Esophagogastroduodenoscopy  2012    WNL, s/p esoph dilation (Dr. Candace Cruise, [facility])  . US echocardiography  11/2010    EF 55-60%, mild LAE, mild diastolic dysfunction, normal valves  . Dexa  08/2013    Spine -2.2, hip -3.5   Family History  Problem Relation Age of Onset  . Heart failure Father   . Heart disease Father   . Cancer Father     prostate  . Hypertension Father   . Coronary artery disease Father   . Leukemia Mother     CLL prior   History  Substance Use Topics  . Smoking status: Former Smoker -- 0.50 packs/day for 54 years    Types: Cigarettes  Start date: 01/18/1954    Quit date: 01/19/2008  . Smokeless tobacco: Never Used  . Alcohol Use: Yes     Comment: 1 1/2-2 glasses white wine/day   OB History    No data available     Review of Systems  All other systems reviewed and are negative.     Allergies  Amlodipine; Doxycycline monohydrate; and Ramipril  Home Medications   Prior to Admission medications   Medication Sig Start Date End Date Taking? Authorizing Provider  acetaminophen (TYLENOL) 500 MG tablet Take 1,000 mg by mouth 2 (two) times daily as needed for pain.    Yes Historical Provider, MD  ALPRAZolam Duanne Moron) 0.25 MG tablet TAKE ONE TABLET BY MOUTH TWICE DAILY AS NEEDED. 03/13/14  Yes Ria Bush, MD  aspirin EC 81 MG tablet Take 81 mg by mouth daily.   Yes Historical Provider, MD  calcitRIOL (ROCALTROL) 0.25 MCG capsule Take 0.75 mcg by mouth every Monday, Wednesday, and Friday. M,W,F   Yes Historical Provider,  MD  cholecalciferol (VITAMIN D) 1000 UNITS tablet Take 1,000 Units by mouth daily.   Yes Historical Provider, MD  furosemide (LASIX) 20 MG tablet TAKE ONE TABLET BY MOUTH TWICE DAILY 01/04/14  Yes Ria Bush, MD  gabapentin (NEURONTIN) 100 MG capsule TAKE ONE CAPSULE BY MOUTH IN THE MORNING AND AT NOON, THEN TAKE TWO CAPSULES BY MOUTH AT BEDTIME 02/12/14  Yes Ria Bush, MD  HYDROcodone-acetaminophen (NORCO/VICODIN) 5-325 MG per tablet Take 1 tablet by mouth 2 (two) times daily as needed for moderate pain. 03/13/14  Yes Ria Bush, MD  nebivolol (BYSTOLIC) 5 MG tablet Take 0.5 tablets (2.5 mg total) by mouth daily. 12/07/13  Yes Peter M Martinique, MD  omeprazole (PRILOSEC) 20 MG capsule Take 1 capsule (20 mg total) by mouth daily. Patient taking differently: Take 20 mg by mouth daily as needed.  12/22/13  Yes Wandra Arthurs, MD  Oyster Shell (OYSTER CALCIUM) 500 MG TABS tablet Take 500 mg of elemental calcium by mouth daily. SEA CALCIUM   Yes Historical Provider, MD  sertraline (ZOLOFT) 25 MG tablet Take 1 tablet (25 mg total) by mouth daily. Patient taking differently: Take 12.5 mg by mouth daily.  09/19/13  Yes Ria Bush, MD  zolpidem (AMBIEN) 5 MG tablet TAKE ONE TABLET BY MOUTH AT BEDTIME. 03/08/14  Yes Ria Bush, MD  famotidine (PEPCID) 20 MG tablet Take 1 tablet (20 mg total) by mouth 2 (two) times daily as needed for heartburn or indigestion. Patient not taking: Reported on 03/01/2014 12/22/13   Wandra Arthurs, MD  Probiotic Product (ALIGN) 4 MG CAPS Take 1 capsule by mouth daily. Patient not taking: Reported on 03/19/2014 03/01/14   Ria Bush, MD   BP 138/68 mmHg  Pulse 60  Temp(Src) 98 F (36.7 C) (Oral)  Resp 16  Ht 5\' 2"  (1.575 m)  Wt 122 lb (55.339 kg)  BMI 22.31 kg/m2  SpO2 97% Physical Exam  Constitutional: She is oriented to person, place, and time. She appears well-developed and well-nourished. No distress.  HENT:  Head: Normocephalic and atraumatic.   Eyes: EOM are normal.  Neck: Normal range of motion.  Cardiovascular: Normal rate, regular rhythm and normal heart sounds.   Pulmonary/Chest: Effort normal and breath sounds normal. No accessory muscle usage. No tachypnea. No respiratory distress. She has no wheezes. She has no rales.  Abdominal: Soft. She exhibits no distension. There is no tenderness.  Musculoskeletal: Normal range of motion.  Neurological: She is alert and oriented  to person, place, and time.  Skin: Skin is warm and dry.  Psychiatric: She has a normal mood and affect. Judgment normal.  Nursing note and vitals reviewed.   ED Course  Procedures (including critical care time) Labs Review Labs Reviewed  CBC - Abnormal; Notable for the following:    RBC 3.79 (*)    Hemoglobin 11.8 (*)    All other components within normal limits  BASIC METABOLIC PANEL - Abnormal; Notable for the following:    Potassium 5.2 (*)    Glucose, Bld 110 (*)    BUN 40 (*)    Creatinine, Ser 2.11 (*)    GFR calc non Af Amer 21 (*)    GFR calc Af Amer 25 (*)    All other components within normal limits  I-STAT TROPOININ, ED   PRO B NATRIURETIC PEPTIDE (BNP)  Date Value Ref Range Status  11/02/2012 179.0* 0.0 - 100.0 pg/mL Final  10/27/2012 342.0* 0.0 - 100.0 pg/mL Final  08/18/2012 2700.0* 0 - 450 pg/mL Final     BUN  Date Value Ref Range Status  03/19/2014 40* 6 - 23 mg/dL Final  12/22/2013 50* 6 - 23 mg/dL Final  12/20/2013 49* 4 - 21 mg/dL Final  12/12/2013 48* 6 - 23 mg/dL Final  09/21/2013 48* 4 - 21 mg/dL Final  09/13/2013 42* 6 - 23 mg/dL Final   CREAT  Date Value Ref Range Status  12/20/2013 2.21  Final  09/21/2013 2.15  Final  01/09/2013 2.32  Final   CREATININE, SER  Date Value Ref Range Status  03/19/2014 2.11* 0.50 - 1.10 mg/dL Final  12/22/2013 2.09* 0.50 - 1.10 mg/dL Final  12/12/2013 2.2* 0.4 - 1.2 mg/dL Final  09/13/2013 2.10* 0.50 - 1.10 mg/dL Final       Imaging Review Dg Chest 2 View (if  Patient Has Fever And/or Copd)  03/19/2014   CLINICAL DATA:  Productive cough with chest pain. Pain in the mid and posterior chest.  EXAM: CHEST  2 VIEW  COMPARISON:  12/22/2013  FINDINGS: There are breast implants. Few patchy densities in the right upper lung appear to be new. Otherwise, the lungs are clear. Atherosclerotic calcifications at the aortic arch. Heart size is normal. Negative for pleural effusions.  IMPRESSION: Few patchy densities in the right upper lung. Findings could represent a small focus of infection. Recommend follow-up to ensure resolution.   Electronically Signed   By: Markus Daft M.D.   On: 03/19/2014 17:43  I personally reviewed the imaging tests through PACS system I reviewed available ER/hospitalization records through the EMR    EKG Interpretation   Date/Time:  Tuesday March 19 2014 16:27:12 EST Ventricular Rate:  58 PR Interval:  170 QRS Duration: 74 QT Interval:  402 QTC Calculation: 394 R Axis:   86 Text Interpretation:  Sinus bradycardia Otherwise normal ECG No  significant change was found Confirmed by Jorrell Kuster  MD, Lennette Bihari ([id number]) on  03/19/2014 5:14:14 PM      MDM   Final diagnoses:  CAP (community acquired pneumonia)   Pt will be treated for community acquired PNA, dc home. pcp follow up. Albuterol PRN cough   Reference: Renal impairment dosing for Levaquin (normal dosage, 500 mg/day) CrCl 20-49 mL/min: 500 mg initially, then 250 mg once daily  Hoy Morn, MD 03/19/14 [phone]

## 2014-03-22 ENCOUNTER — Emergency Department (HOSPITAL_COMMUNITY)
Admission: EM | Admit: 2014-03-22 | Discharge: 2014-03-23 | Disposition: A | Discharge status: Home / Self Care | Payer: Medicare Other | Attending: Emergency Medicine | Admitting: Emergency Medicine

## 2014-03-22 ENCOUNTER — Encounter (HOSPITAL_COMMUNITY): Payer: Self-pay

## 2014-03-22 ENCOUNTER — Telehealth: Payer: Self-pay | Admitting: Family Medicine

## 2014-03-22 DIAGNOSIS — M25511 Pain in right shoulder: Secondary | ICD-10-CM | POA: Insufficient documentation

## 2014-03-22 DIAGNOSIS — Z8673 Personal history of transient ischemic attack (TIA), and cerebral infarction without residual deficits: Secondary | ICD-10-CM | POA: Insufficient documentation

## 2014-03-22 DIAGNOSIS — M25551 Pain in right hip: Secondary | ICD-10-CM | POA: Insufficient documentation

## 2014-03-22 DIAGNOSIS — Z79899 Other long term (current) drug therapy: Secondary | ICD-10-CM | POA: Diagnosis not present

## 2014-03-22 DIAGNOSIS — Z87891 Personal history of nicotine dependence: Secondary | ICD-10-CM | POA: Insufficient documentation

## 2014-03-22 DIAGNOSIS — R197 Diarrhea, unspecified: Secondary | ICD-10-CM | POA: Diagnosis not present

## 2014-03-22 DIAGNOSIS — Z8742 Personal history of other diseases of the female genital tract: Secondary | ICD-10-CM | POA: Insufficient documentation

## 2014-03-22 DIAGNOSIS — G43909 Migraine, unspecified, not intractable, without status migrainosus: Secondary | ICD-10-CM | POA: Insufficient documentation

## 2014-03-22 DIAGNOSIS — N184 Chronic kidney disease, stage 4 (severe): Secondary | ICD-10-CM | POA: Insufficient documentation

## 2014-03-22 DIAGNOSIS — J449 Chronic obstructive pulmonary disease, unspecified: Secondary | ICD-10-CM | POA: Insufficient documentation

## 2014-03-22 DIAGNOSIS — I129 Hypertensive chronic kidney disease with stage 1 through stage 4 chronic kidney disease, or unspecified chronic kidney disease: Secondary | ICD-10-CM | POA: Diagnosis not present

## 2014-03-22 DIAGNOSIS — F419 Anxiety disorder, unspecified: Secondary | ICD-10-CM | POA: Diagnosis not present

## 2014-03-22 DIAGNOSIS — R079 Chest pain, unspecified: Secondary | ICD-10-CM

## 2014-03-22 DIAGNOSIS — Z8719 Personal history of other diseases of the digestive system: Secondary | ICD-10-CM | POA: Diagnosis not present

## 2014-03-22 DIAGNOSIS — F329 Major depressive disorder, single episode, unspecified: Secondary | ICD-10-CM | POA: Diagnosis not present

## 2014-03-22 DIAGNOSIS — M79604 Pain in right leg: Secondary | ICD-10-CM | POA: Diagnosis present

## 2014-03-22 DIAGNOSIS — Z7982 Long term (current) use of aspirin: Secondary | ICD-10-CM | POA: Insufficient documentation

## 2014-03-22 DIAGNOSIS — R52 Pain, unspecified: Secondary | ICD-10-CM | POA: Diagnosis not present

## 2014-03-22 DIAGNOSIS — E871 Hypo-osmolality and hyponatremia: Secondary | ICD-10-CM | POA: Insufficient documentation

## 2014-03-22 NOTE — ED Notes (Signed)
Per EMS: Pt began experiencing R leg pain progressing to R hip pain. Currently complains of R shoulder pain. Denies any chest pain at this time. BP = 161/78, HR 57, 16 resps. 7/10 pain.

## 2014-03-22 NOTE — ED Provider Notes (Addendum)
CSN: [id number]     Arrival date & time 03/22/14  2338 History  This chart was scribed for Everlene Balls, MD by Evelene Croon, ED Scribe. This patient was seen in room B19C/B19C and the patient's care was started 11:43 PM.    Chief Complaint  Patient presents with  . Leg Pain     The history is provided by the patient and the EMS personnel. No language interpreter was used.     HPI Comments:  Nicole English is a 81 y.o. female brought in by ambulance, who presents to the Emergency Department complaining of mild to moderate RLE pain that radiates into her right hip that started this am and right shoulder pain that started this evening . She  notes the pain runs up the back of her leg. She denies unilateral weakness, recent fall/injury and h/o similar lower extremity pain.  Pt notes mild intermittent twinge in her chest but denies CP. She also denies  SOB and abdominal pain. Pt had a CXR ~4 days ago and was diagnosed with PNA. She was placed on Levaquin. She notes mild diarrhea secondary to abx. She also reports intermittent vertigo for ~3 days. Per EMS pt had a negative stroke screen en route. BP was 161/78 and HR was in the 60s.    Past Medical History  Diagnosis Date  . History of diverticulitis of colon   . HLD (hyperlipidemia)   . Anxiety   . Osteoporosis 08/2013    Spine -2.2, hip -3.5  . Depression   . GERD (gastroesophageal reflux disease)   . IBS (irritable bowel syndrome)   . Glucose intolerance (impaired glucose tolerance)   . Allergic rhinitis   . Migraine   . HTN (hypertension)     Dr. Martinique, cards  . SUI (stress urinary incontinence, female)     Dr. Amalia Hailey, urology (some urge as well)  . Diverticulosis     colonsocopy 2002 aborted 2/2 tortuous colon and heavy sigmoid diverticulosis  . Dysphagia     EGD 2012 - wnl, s/p esoph dilation  . CKD (chronic kidney disease) stage 4, GFR 15-29 ml/min 12/2012    per pt after brown recluse bite. sees Dr Juleen China, off ACEI, bp ok  slightly elevated to maintain renal perfusion  . Diastolic dysfunction 0000000    per echo in November 2012; normal EF  . DDD (degenerative disc disease), lumbar   . 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 History of CVA (cerebrovascular accident) 2012    old per prior head CT   Past Surgical History  Procedure Laterality Date  . Tonsillectomy  1942  . Submucous resection  1962  . Tubal ligation  1973  . Breast enhancement surgery  1982  . Total abdominal hysterectomy  1996    for frequent dysmennorhea  . Macroplastique implantation  03/2008    Dr. Rogers Blocker  . Uretherolysis and removal of macroplastique  09/13/08    Dr. Amalia Hailey  . Ct head limited w/o cm  04/2010    nothing acute, ? old infarcts L internal capsule  . Renal doppler  2009?    simple cysts, wnl  . Colonoscopy  2002    does not want another!  . Esophagogastroduodenoscopy  2012    WNL, s/p esoph dilation (Dr. Candace Cruise, [facility])  . US echocardiography  11/2010    EF 55-60%, mild LAE, mild diastolic dysfunction, normal valves  .  Dexa  08/2013    Spine -2.2, hip -3.5   Family History  Problem Relation Age of Onset  . Heart failure Father   . Heart disease Father   . Cancer Father     prostate  . Hypertension Father   . Coronary artery disease Father   . Leukemia Mother     CLL prior   History  Substance Use Topics  . Smoking status: Former Smoker -- 0.50 packs/day for 54 years    Types: Cigarettes    Start date: 01/18/1954    Quit date: 01/19/2008  . Smokeless tobacco: Never Used  . Alcohol Use: Yes     Comment: 1 1/2-2 glasses white wine/day   OB History    No data available     Review of Systems  Constitutional: Negative for fever and chills.  Respiratory: Negative for shortness of breath.   Cardiovascular: Negative for chest pain.  Gastrointestinal: Positive for diarrhea. Negative for nausea and vomiting.  Musculoskeletal: Positive  for myalgias and arthralgias.  All other systems reviewed and are negative.     Allergies  Amlodipine; Doxycycline monohydrate; and Ramipril  Home Medications   Prior to Admission medications   Medication Sig Start Date End Date Taking? Authorizing Provider  acetaminophen (TYLENOL) 500 MG tablet Take 1,000 mg by mouth 2 (two) times daily as needed for pain.    Yes Historical Provider, MD  ALPRAZolam (XANAX) 0.25 MG tablet TAKE ONE TABLET BY MOUTH TWICE DAILY AS NEEDED. Patient taking differently: TAKE ONE TABLET BY MOUTH TWICE DAILY AS NEEDED FOR ANXIETY. 03/13/14  Yes Ria Bush, MD  aspirin EC 81 MG tablet Take 81 mg by mouth daily.   Yes Historical Provider, MD  calcitRIOL (ROCALTROL) 0.25 MCG capsule Take 0.75 mcg by mouth every Monday, Wednesday, and Friday. M,W,F   Yes Historical Provider, MD  cholecalciferol (VITAMIN D) 1000 UNITS tablet Take 1,000 Units by mouth daily.   Yes Historical Provider, MD  furosemide (LASIX) 20 MG tablet TAKE ONE TABLET BY MOUTH TWICE DAILY 01/04/14  Yes Ria Bush, MD  gabapentin (NEURONTIN) 100 MG capsule TAKE ONE CAPSULE BY MOUTH IN THE MORNING AND AT NOON, THEN TAKE TWO CAPSULES BY MOUTH AT BEDTIME 02/12/14  Yes Ria Bush, MD  HYDROcodone-acetaminophen (NORCO/VICODIN) 5-325 MG per tablet Take 1 tablet by mouth 2 (two) times daily as needed for moderate pain. 03/13/14  Yes Ria Bush, MD  levofloxacin (LEVAQUIN) 250 MG tablet Take 1 tablet (250 mg total) by mouth daily. 03/19/14  Yes Hoy Morn, MD  nebivolol (BYSTOLIC) 5 MG tablet Take 0.5 tablets (2.5 mg total) by mouth daily. 12/07/13  Yes Peter M Martinique, MD  Oyster Shell (OYSTER CALCIUM) 500 MG TABS tablet Take 500 mg of elemental calcium by mouth daily. SEA CALCIUM   Yes Historical Provider, MD  sertraline (ZOLOFT) 25 MG tablet Take 1 tablet (25 mg total) by mouth daily. Patient taking differently: Take 12.5 mg by mouth daily.  09/19/13  Yes Ria Bush, MD  zolpidem  (AMBIEN) 5 MG tablet TAKE ONE TABLET BY MOUTH AT BEDTIME. 03/08/14  Yes Ria Bush, MD  famotidine (PEPCID) 20 MG tablet Take 1 tablet (20 mg total) by mouth 2 (two) times daily as needed for heartburn or indigestion. Patient not taking: Reported on 03/01/2014 12/22/13   Wandra Arthurs, MD  omeprazole (PRILOSEC) 20 MG capsule Take 1 capsule (20 mg total) by mouth daily. Patient not taking: Reported on 03/23/2014 12/22/13   Wandra Arthurs, MD  Probiotic Product (ALIGN) 4 MG CAPS Take 1 capsule by mouth daily. Patient not taking: Reported on 03/19/2014 03/01/14   Ria Bush, MD   BP 143/66 mmHg  Pulse 57  Temp(Src) 97.9 F (36.6 C) (Oral)  Resp 17  SpO2 100% Physical Exam  Constitutional: She is oriented to person, place, and time. She appears well-developed and well-nourished. No distress.  HENT:  Head: Normocephalic and atraumatic.  Nose: Nose normal.  Mouth/Throat: Oropharynx is clear and moist. No oropharyngeal exudate.  Eyes: Conjunctivae and EOM are normal. Pupils are equal, round, and reactive to light. No scleral icterus.  Neck: Normal range of motion. Neck supple. No JVD present. No tracheal deviation present. No thyromegaly present.  Cardiovascular: Normal rate, regular rhythm and normal heart sounds.  Exam reveals no gallop and no friction rub.   No murmur heard. Pulmonary/Chest: Effort normal and breath sounds normal. No respiratory distress. She has no wheezes. She exhibits no tenderness.  Abdominal: Soft. Bowel sounds are normal. She exhibits no distension and no mass. There is no tenderness. There is no rebound and no guarding.  Musculoskeletal: Normal range of motion. She exhibits no edema or tenderness.  Lymphadenopathy:    She has no cervical adenopathy.  Neurological: She is alert and oriented to person, place, and time. No cranial nerve deficit. She exhibits normal muscle tone.  Skin: Skin is warm and dry. No rash noted. No erythema. No pallor.  Nursing note and vitals  reviewed.   ED Course  Procedures   DIAGNOSTIC STUDIES:  Oxygen Saturation is 100% on RA, normal by my interpretation.    COORDINATION OF CARE:  12:06 AM Will order CXR, EKG and blood work. Discussed treatment plan with pt at bedside and pt agreed to plan.  Labs Review  Labs Reviewed  CBC WITH DIFFERENTIAL/PLATELET - Abnormal; Notable for the following:    RBC 3.41 (*)    Hemoglobin 10.5 (*)    HCT 31.8 (*)    All other components within normal limits  BASIC METABOLIC PANEL - Abnormal; Notable for the following:    Sodium 130 (*)    Glucose, Bld 114 (*)    BUN 34 (*)    Creatinine, Ser 1.96 (*)    GFR calc non Af Amer 23 (*)    GFR calc Af Amer 27 (*)    All other components within normal limits  Randolm Idol, ED    Imaging Review Dg Chest 2 View  03/23/2014   CLINICAL DATA:  Chest pain.  Recent diagnosis of pneumonia.  EXAM: CHEST  2 VIEW  COMPARISON:  03/19/2014  FINDINGS: Decreased but still persistent reticular nodular opacity in the right upper lobe. Volume loss and opacity either in the middle lobe or lingula is stable and consistent with scarring. No new consolidation, edema, effusion, or pneumothorax. Normal heart size and stable mild aortic tortuosity. Calcified breast implants noted.  IMPRESSION: Presumed pneumonia in the right upper lobe is improved. Recommend additional chest x-ray follow-up 6 weeks after treatment.   Electronically Signed   By: Monte Fantasia M.D.   On: 03/23/2014 01:23     EKG Interpretation None      MDM   Final diagnoses:  Chest pain    Patient presents emergency department for right lower extremity pain. She cannot describe the character of this pain but states that it does radiate up into her right hip. This all began after starting Levaquin a few days ago for pneumonia. Patient has had diarrhea as well.  Laboratory studies reveal hyponatremia, sodium of 130.  This is new for her and likely due to the diarrhea and possible  dehydration. Patient was given 1 L of IV fluids to replace her sodium. Her discomfort in the muscles of her right leg may be due to her hyponatremia. Also possible is Legionella pneumonia, as the patient had a infiltrate, now hyponatremia and diarrhea.   I have low concern for emergent cause for patient's hip pain or vascular dissection. She is not having any neurological symptoms distally. She is denying any abdominal pain or chest pain. She remains hemodynamically stable. For now will continue course of Levaquin and encourage primary care follow-up within 3 days for continued management. Strict return precautions given. Her vital signs remain within her normal limits and she is safe for discharge.   I personally performed the services described in this documentation, which was scribed in my presence. The recorded information has been reviewed and is accurate.     Everlene Balls, MD 03/23/14 [id number]  Everlene Balls, MD 03/23/14 [id number]  Everlene Balls, MD 03/23/14 0300

## 2014-03-22 NOTE — Telephone Encounter (Signed)
[facility]  Patient Name: Nicole English  DOB: 10/30/1932    Initial Comment [name] states she is having some pain running up the right side of her body. Her BP is 161/120. She is feeling movement going up the right side of your body.    Nurse Assessment  Nurse: Graylon Good, RN, Olin Hauser Date/Time Eilene Ghazi Time): 03/22/2014 10:38:09 PM  Confirm and document reason for call. If symptomatic, describe symptoms. ---[name] states she is having some pain running up the right side of her body. Her BP is 161/120. She is feeling movement going up the right side of your body.  Has the patient traveled out of the country within the last 30 days? ---Not Applicable  Does the patient require triage? ---Yes  Related visit to physician within the last 2 weeks? ---No  Does the PT have any chronic conditions? (i.e. diabetes, asthma, etc.) ---Yes  List chronic conditions. ---Kidney disease     Guidelines    Guideline Title Affirmed Question Affirmed Notes  High Blood Pressure [1] Weakness of the face, arm or leg on one side of the body AND [2] new onset    Final Disposition User   Call EMS 911 Now Graylon Good, Therapist, sports, [employer]

## 2014-03-23 ENCOUNTER — Emergency Department (HOSPITAL_COMMUNITY): Payer: Medicare Other

## 2014-03-23 DIAGNOSIS — R079 Chest pain, unspecified: Secondary | ICD-10-CM | POA: Diagnosis not present

## 2014-03-23 DIAGNOSIS — M25551 Pain in right hip: Secondary | ICD-10-CM | POA: Diagnosis not present

## 2014-03-23 LAB — I-STAT TROPONIN, ED: Troponin i, poc: 0.01 ng/mL (ref 0.00–0.08)

## 2014-03-23 LAB — CBC WITH DIFFERENTIAL/PLATELET
BASOS ABS: 0.1 10*3/uL (ref 0.0–0.1)
Basophils Relative: 1 % (ref 0–1)
EOS ABS: 0.3 10*3/uL (ref 0.0–0.7)
Eosinophils Relative: 4 % (ref 0–5)
HCT: 31.8 % — ABNORMAL LOW (ref 36.0–46.0)
Hemoglobin: 10.5 g/dL — ABNORMAL LOW (ref 12.0–15.0)
LYMPHS PCT: 26 % (ref 12–46)
Lymphs Abs: 1.8 10*3/uL (ref 0.7–4.0)
MCH: 30.8 pg (ref 26.0–34.0)
MCHC: 33 g/dL (ref 30.0–36.0)
MCV: 93.3 fL (ref 78.0–100.0)
MONOS PCT: 9 % (ref 3–12)
Monocytes Absolute: 0.6 10*3/uL (ref 0.1–1.0)
NEUTROS ABS: 4 10*3/uL (ref 1.7–7.7)
Neutrophils Relative %: 60 % (ref 43–77)
Platelets: 284 10*3/uL (ref 150–400)
RBC: 3.41 MIL/uL — ABNORMAL LOW (ref 3.87–5.11)
RDW: 12.7 % (ref 11.5–15.5)
WBC: 6.7 10*3/uL (ref 4.0–10.5)

## 2014-03-23 LAB — BASIC METABOLIC PANEL
Anion gap: 9 (ref 5–15)
BUN: 34 mg/dL — ABNORMAL HIGH (ref 6–23)
CO2: 22 mmol/L (ref 19–32)
Calcium: 8.9 mg/dL (ref 8.4–10.5)
Chloride: 99 mmol/L (ref 96–112)
Creatinine, Ser: 1.96 mg/dL — ABNORMAL HIGH (ref 0.50–1.10)
GFR calc Af Amer: 27 mL/min — ABNORMAL LOW (ref >90)
GFR calc non Af Amer: 23 mL/min — ABNORMAL LOW (ref >90)
Glucose, Bld: 114 mg/dL — ABNORMAL HIGH (ref 70–99)
POTASSIUM: 4 mmol/L (ref 3.5–5.1)
Sodium: 130 mmol/L — ABNORMAL LOW (ref 135–145)

## 2014-03-23 MED ORDER — SODIUM CHLORIDE 0.9 % IV BOLUS (SEPSIS)
1000.0000 mL | Freq: Once | INTRAVENOUS | Status: AC
  Administered 2014-03-23: 1000 mL via INTRAVENOUS

## 2014-03-23 NOTE — Discharge Instructions (Signed)
Pneumonia, Adult Nicole English, Your sodium level was low toady and may be causing your symptoms.  Be sure to stay well hydrated with Gatorade as you're having diarrhea with this antibiotic. Continue the antibiotic as prescribed and follow-up with your primary physician within 3 days for continued management. For any worsening come back to the emergency department immediately. Thank you. Pneumonia is an infection of the lungs. It may be caused by a germ (virus or bacteria). Some types of pneumonia can spread easily from person to person. This can happen when you cough or sneeze. HOME CARE  Only take medicine as told by your doctor.  Take your medicine (antibiotics) as told. Finish it even if you start to feel better.  Do not smoke.  You may use a vaporizer or humidifier in your room. This can help loosen thick spit (mucus).  Sleep so you are almost sitting up (semi-upright). This helps reduce coughing.  Rest. A shot (vaccine) can help prevent pneumonia. Shots are often advised for:  People over 35 years old.  Patients on chemotherapy.  People with long-term (chronic) lung problems.  People with immune system problems. GET HELP RIGHT AWAY IF:   You are getting worse.  You cannot control your cough, and you are losing sleep.  You cough up blood.  Your pain gets worse, even with medicine.  You have a fever.  Any of your problems are getting worse, not better.  You have shortness of breath or chest pain. MAKE SURE YOU:   Understand these instructions.  Will watch your condition.  Will get help right away if you are not doing well or get worse. Document Released: 06/23/2007 Document Revised: 03/29/2011 Document Reviewed: 03/27/2010 [facility] Patient Information 2015 [place], [place]. This information is not intended to replace advice given to you by your health care provider. Make sure you discuss any questions you have with your health care provider.  Hip Pain Your hip is  the joint between your upper legs and your lower pelvis. The bones, cartilage, tendons, and muscles of your hip joint perform a lot of work each day supporting your body weight and allowing you to move around. Hip pain can range from a minor ache to severe pain in one or both of your hips. Pain may be felt on the inside of the hip joint near the groin, or the outside near the buttocks and upper thigh. You may have swelling or stiffness as well.  HOME CARE INSTRUCTIONS   Take medicines only as directed by your health care provider.  Apply ice to the injured area:  Put ice in a plastic bag.  Place a towel between your skin and the bag.  Leave the ice on for 15-20 minutes at a time, 3-4 times a day.  Keep your leg raised (elevated) when possible to lessen swelling.  Avoid activities that cause pain.  Follow specific exercises as directed by your health care provider.  Sleep with a pillow between your legs on your most comfortable side.  Record how often you have hip pain, the location of the pain, and what it feels like. SEEK MEDICAL CARE IF:   You are unable to put weight on your leg.  Your hip is red or swollen or very tender to touch.  Your pain or swelling continues or worsens after 1 week.  You have increasing difficulty walking.  You have a fever. SEEK IMMEDIATE MEDICAL CARE IF:   You have fallen.  You have a sudden increase in  pain and swelling in your hip. MAKE SURE YOU:   Understand these instructions.  Will watch your condition.  Will get help right away if you are not doing well or get worse. Document Released: 06/24/2009 Document Revised: 05/21/2013 Document Reviewed: 08/31/2012 [facility] Patient Information 2015 [place], [place]. This information is not intended to replace advice given to you by your health care provider. Make sure you discuss any questions you have with your health care provider.

## 2014-03-25 ENCOUNTER — Encounter: Payer: Self-pay | Admitting: Family Medicine

## 2014-03-25 ENCOUNTER — Ambulatory Visit (INDEPENDENT_AMBULATORY_CARE_PROVIDER_SITE_OTHER): Payer: Medicare Other | Admitting: Family Medicine

## 2014-03-25 VITALS — BP 148/70 | HR 68 | Temp 97.9°F | Ht 62.0 in | Wt 126.1 lb

## 2014-03-25 DIAGNOSIS — E871 Hypo-osmolality and hyponatremia: Secondary | ICD-10-CM

## 2014-03-25 DIAGNOSIS — J189 Pneumonia, unspecified organism: Secondary | ICD-10-CM | POA: Insufficient documentation

## 2014-03-25 DIAGNOSIS — J181 Lobar pneumonia, unspecified organism: Secondary | ICD-10-CM

## 2014-03-25 HISTORY — DX: Pneumonia, unspecified organism: J18.9

## 2014-03-25 LAB — RENAL FUNCTION PANEL
ALBUMIN: 4.2 g/dL (ref 3.5–5.2)
BUN: 40 mg/dL — AB (ref 6–23)
CALCIUM: 9.3 mg/dL (ref 8.4–10.5)
CO2: 25 meq/L (ref 19–32)
CREATININE: 2.2 mg/dL — AB (ref 0.40–1.20)
Chloride: 107 mEq/L (ref 96–112)
GFR: 22.83 mL/min — AB (ref >60.00)
Glucose, Bld: 108 mg/dL — ABNORMAL HIGH (ref 70–99)
Phosphorus: 4.6 mg/dL (ref 2.3–4.6)
Potassium: 4.7 mEq/L (ref 3.5–5.1)
Sodium: 138 mEq/L (ref 135–145)

## 2014-03-25 NOTE — Telephone Encounter (Signed)
PLEASE NOTE: All timestamps contained within this report are represented as [place] Standard Time. CONFIDENTIALTY NOTICE: This fax transmission is intended only for the addressee. It contains information that is legally privileged, confidential or otherwise protected from use or disclosure. If you are not the intended recipient, you are strictly prohibited from reviewing, disclosing, copying using or disseminating any of this information or taking any action in reliance on or regarding this information. If you have received this fax in error, please notify us immediately by telephone so that we can arrange for its return to [place]. Phone: [phone], Toll-Free: [phone], Fax: [phone] Page: 1 of 2 Call Id: [id number] [place] Patient Name: Nicole English Gender: Female DOB: 10-10-1932 Age: 81 Y 3 D Return Phone Number: [phone] (Primary) Address: City/State/Zip: [place] Client [place] Night - Client Client Site [facility] Physician Ria Bush Contact Type Call Call Type Triage / Clinical Relationship To Patient Self Return Phone Number [phone] (Primary) Chief Complaint BLOOD PRESSURE HIGH - Diastolic (bottom number) [phone] or [phone] (with symptoms) Initial Comment [name] states she is having some pain running up the right side of her body. Her BP is 161/120. She is feeling movement going up the right side of your body. PreDisposition InappropriateToAsk Nurse Assessment Nurse: Graylon Good, RN, Olin Hauser Date/Time Eilene Ghazi Time): 03/22/2014 10:38:09 PM Confirm and document reason for call. If symptomatic, describe symptoms. ---[name] states she is having some pain running up the right side of her body. Her BP is 161/120. She is feeling movement going up the right side of your body. Has the patient traveled out of the country within  the last 30 days? ---Not Applicable Does the patient require triage? ---Yes Related visit to physician within the last 2 weeks? ---No Does the PT have any chronic conditions? (i.e. diabetes, asthma, etc.) ---Yes List chronic conditions. ---Kidney disease Guidelines Guideline Title Affirmed Question Affirmed Notes Nurse Date/Time (Eastern Time) High Blood Pressure [1] Weakness of the face, arm or leg on one side of the body AND [2] new onset Asa Saunas 03/22/2014 10:42:05 PM Disp. Time Eilene Ghazi Time) Disposition Final User 03/22/2014 10:31:09 PM Send to Urgent Fenton Foy 03/22/2014 10:46:32 PM 911 Follow Up Call Attempted Graylon Good, RN, Olin Hauser Reason: Per Osvaldo Human, EMS on their way. 03/22/2014 10:42:48 PM Call EMS 911 Now Yes Graylon Good, RN, Olin Hauser PLEASE NOTE: All timestamps contained within this report are represented as [place] Standard Time. CONFIDENTIALTY NOTICE: This fax transmission is intended only for the addressee. It contains information that is legally privileged, confidential or otherwise protected from use or disclosure. If you are not the intended recipient, you are strictly prohibited from reviewing, disclosing, copying using or disseminating any of this information or taking any action in reliance on or regarding this information. If you have received this fax in error, please notify us immediately by telephone so that we can arrange for its return to [place]. Phone: [phone], Toll-Free: [phone], Fax: [phone] Page: 2 of 2 Call Id: [id number] [name] Understands: Yes Disagree/Comply: Comply Care Advice Given Per Guideline CALL EMS 911 NOW: Immediate medical attention is needed. You need to hang up and call 911 (or an ambulance). Psychologist, forensic Discretion: I'll call you back in a few minutes to be sure you were able to reach them.) CARE ADVICE given per High Blood Pressure (Adult) guideline. After Care Instructions Given Call Event Type User Date / Time Description

## 2014-03-25 NOTE — Telephone Encounter (Signed)
Seen at ER over weekend.

## 2014-03-25 NOTE — Patient Instructions (Signed)
I think you are doing better. Should get better each day. Ok to decrease lasix to 20mg  once daily.  Finish levaquin course and take with align probiotic. Blood work today to recheck sodium levels. Good to see you today, call us with questions.

## 2014-03-25 NOTE — Assessment & Plan Note (Addendum)
Completing levaquin course, slowly improving.  Discussed restart align while on antibiotic. Pt slowly feeling better.  R shoulder and breast pain may be stemming from current PNA, regardless is improving. ?tendon pain from fluoroquinolone. rec finish course regardless - has 3 d left.

## 2014-03-25 NOTE — Progress Notes (Signed)
BP 148/70 mmHg  Pulse 68  Temp(Src) 97.9 F (36.6 C) (Oral)  Ht 5\' 2"  (1.575 m)  Wt 126 lb 1.9 oz (57.208 kg)  BMI 23.06 kg/m2  SpO2 94%   CC: hospital f/u visit  Subjective:    Patient ID: Nicole English, female    DOB: 12-Apr-1932, 81 y.o.   MRN: [id number]  HPI: Nicole English is a 81 y.o. female presenting on 03/25/2014 for Follow-up and Pneumonia   Seen 03/19/2014 with productive cough and dyspnea, dx with RUL community acquired pneumonia by xray and treated with renally dosed levaquin.   She was again seen 03/22/2014 at ER with R leg and R shoulder pain and mild diarrhea after starting levaquin. Was found to be hyponatremic to 130, thought from diarrhea. Given 1L IVF. ?Legionella.   All ER records reviewed.  Since she's been home diarrhea has resolved. Had normal bowel movement on Saturday.  Describes persistent soreness at heels, and at R shoulder into R breast.  Never significant cough. Endorses globus sensation. Did have fever initially but not since on antibiotics.  Not around any smokers.  Relevant past medical, surgical, family and social history reviewed and updated as indicated. Interim medical history since our last visit reviewed. Allergies and medications reviewed and updated. Current Outpatient Prescriptions on File Prior to Visit  Medication Sig  . acetaminophen (TYLENOL) 500 MG tablet Take 1,000 mg by mouth 2 (two) times daily as needed for pain.   Marland Kitchen ALPRAZolam (XANAX) 0.25 MG tablet TAKE ONE TABLET BY MOUTH TWICE DAILY AS NEEDED. (Patient taking differently: TAKE ONE TABLET BY MOUTH TWICE DAILY AS NEEDED FOR ANXIETY.)  . aspirin EC 81 MG tablet Take 81 mg by mouth daily.  . calcitRIOL (ROCALTROL) 0.25 MCG capsule Take 0.75 mcg by mouth every Monday, Wednesday, and Friday. M,W,F  . cholecalciferol (VITAMIN D) 1000 UNITS tablet Take 1,000 Units by mouth daily.  . famotidine (PEPCID) 20 MG tablet Take 1 tablet (20 mg total) by mouth 2 (two) times daily as needed  for heartburn or indigestion.  . gabapentin (NEURONTIN) 100 MG capsule TAKE ONE CAPSULE BY MOUTH IN THE MORNING AND AT NOON, THEN TAKE TWO CAPSULES BY MOUTH AT BEDTIME  . HYDROcodone-acetaminophen (NORCO/VICODIN) 5-325 MG per tablet Take 1 tablet by mouth 2 (two) times daily as needed for moderate pain.  Marland Kitchen levofloxacin (LEVAQUIN) 250 MG tablet Take 1 tablet (250 mg total) by mouth daily.  . nebivolol (BYSTOLIC) 5 MG tablet Take 0.5 tablets (2.5 mg total) by mouth daily.  Marland Kitchen omeprazole (PRILOSEC) 20 MG capsule Take 1 capsule (20 mg total) by mouth daily.  Loma [place] (OYSTER CALCIUM) 500 MG TABS tablet Take 500 mg of elemental calcium by mouth daily. SEA CALCIUM  . sertraline (ZOLOFT) 25 MG tablet Take 1 tablet (25 mg total) by mouth daily. (Patient taking differently: Take 12.5 mg by mouth daily. )  . zolpidem (AMBIEN) 5 MG tablet TAKE ONE TABLET BY MOUTH AT BEDTIME.  . Probiotic Product (ALIGN) 4 MG CAPS Take 1 capsule by mouth daily. (Patient not taking: Reported on 03/25/2014)   No current facility-administered medications on file prior to visit.    Review of Systems Per HPI unless specifically indicated above     Objective:    BP 148/70 mmHg  Pulse 68  Temp(Src) 97.9 F (36.6 C) (Oral)  Ht 5\' 2"  (1.575 m)  Wt 126 lb 1.9 oz (57.208 kg)  BMI 23.06 kg/m2  SpO2 94%  Wt Readings from  Last 3 Encounters:  03/25/14 126 lb 1.9 oz (57.208 kg)  03/19/14 122 lb (55.339 kg)  03/01/14 126 lb 8 oz (57.38 kg)    Physical Exam  Constitutional: She appears well-developed and well-nourished. No distress.  Elderly  HENT:  Mouth/Throat: Oropharynx is clear and moist. No oropharyngeal exudate.  Eyes: Conjunctivae are normal. Pupils are equal, round, and reactive to light.  Cardiovascular: Normal rate, regular rhythm, normal heart sounds and intact distal pulses.   No murmur heard. Pulmonary/Chest: Effort normal and breath sounds normal. No respiratory distress. She has no decreased breath  sounds. She has no wheezes. She has no rhonchi. She has no rales.  Coarse breath sounds bilateral apices  Lymphadenopathy:    She has no cervical adenopathy.  Skin: Skin is warm and dry. No rash noted.  Psychiatric: She has a normal mood and affect.  Nursing note and vitals reviewed.  Results for orders placed or performed during the hospital encounter of 03/22/14  CBC with Differential/Platelet  Result Value Ref Range   WBC 6.7 4.0 - 10.5 K/uL   RBC 3.41 (L) 3.87 - 5.11 MIL/uL   Hemoglobin 10.5 (L) 12.0 - 15.0 g/dL   HCT 31.8 (L) 36.0 - 46.0 %   MCV 93.3 78.0 - 100.0 fL   MCH 30.8 26.0 - 34.0 pg   MCHC 33.0 30.0 - 36.0 g/dL   RDW 12.7 11.5 - 15.5 %   Platelets 284 150 - 400 K/uL   Neutrophils Relative % 60 43 - 77 %   Neutro Abs 4.0 1.7 - 7.7 K/uL   Lymphocytes Relative 26 12 - 46 %   Lymphs Abs 1.8 0.7 - 4.0 K/uL   Monocytes Relative 9 3 - 12 %   Monocytes Absolute 0.6 0.1 - 1.0 K/uL   Eosinophils Relative 4 0 - 5 %   Eosinophils Absolute 0.3 0.0 - 0.7 K/uL   Basophils Relative 1 0 - 1 %   Basophils Absolute 0.1 0.0 - 0.1 K/uL  Basic metabolic panel  Result Value Ref Range   Sodium 130 (L) 135 - 145 mmol/L   Potassium 4.0 3.5 - 5.1 mmol/L   Chloride 99 96 - 112 mmol/L   CO2 22 19 - 32 mmol/L   Glucose, Bld 114 (H) 70 - 99 mg/dL   BUN 34 (H) 6 - 23 mg/dL   Creatinine, Ser 1.96 (H) 0.50 - 1.10 mg/dL   Calcium 8.9 8.4 - 10.5 mg/dL   GFR calc non Af Amer 23 (L) >90 mL/min   GFR calc Af Amer 27 (L) >90 mL/min   Anion gap 9 5 - 15  I-stat troponin, ED  Result Value Ref Range   Troponin i, poc 0.01 0.00 - 0.08 ng/mL   Comment 3              Assessment & Plan:   Problem List Items Addressed This Visit    Right upper lobe pneumonia    Completing levaquin course, slowly improving.  Discussed restart align while on antibiotic. Pt slowly feeling better.  R shoulder and breast pain may be stemming from current PNA, regardless is improving. ?tendon pain from  fluoroquinolone. rec finish course regardless - has 3 d left.      Hyponatremia - Primary    Recheck today - anticipate dehydration- and diarrhea-related hyponatremia that has resolved with IVF.      Relevant Orders   Renal function panel       Follow up plan: Return as needed.

## 2014-03-25 NOTE — Assessment & Plan Note (Signed)
Recheck today - anticipate dehydration- and diarrhea-related hyponatremia that has resolved with IVF.

## 2014-03-25 NOTE — Telephone Encounter (Signed)
Pt has appt today at 2 pm with Dr Darnell Level.

## 2014-03-25 NOTE — Progress Notes (Signed)
Pre visit review using our clinic review tool, if applicable. No additional management support is needed unless otherwise documented below in the visit note. 

## 2014-04-02 ENCOUNTER — Emergency Department (HOSPITAL_COMMUNITY)
Admission: EM | Admit: 2014-04-02 | Discharge: 2014-04-03 | Disposition: A | Discharge status: Home / Self Care | Payer: Medicare Other | Attending: Emergency Medicine | Admitting: Emergency Medicine

## 2014-04-02 ENCOUNTER — Encounter (HOSPITAL_COMMUNITY): Payer: Self-pay | Admitting: Emergency Medicine

## 2014-04-02 ENCOUNTER — Emergency Department (HOSPITAL_COMMUNITY): Payer: Medicare Other

## 2014-04-02 ENCOUNTER — Telehealth: Payer: Self-pay | Admitting: Family Medicine

## 2014-04-02 DIAGNOSIS — Z7982 Long term (current) use of aspirin: Secondary | ICD-10-CM | POA: Diagnosis not present

## 2014-04-02 DIAGNOSIS — I159 Secondary hypertension, unspecified: Secondary | ICD-10-CM | POA: Insufficient documentation

## 2014-04-02 DIAGNOSIS — N184 Chronic kidney disease, stage 4 (severe): Secondary | ICD-10-CM | POA: Insufficient documentation

## 2014-04-02 DIAGNOSIS — J449 Chronic obstructive pulmonary disease, unspecified: Secondary | ICD-10-CM | POA: Insufficient documentation

## 2014-04-02 DIAGNOSIS — Z87891 Personal history of nicotine dependence: Secondary | ICD-10-CM | POA: Insufficient documentation

## 2014-04-02 DIAGNOSIS — Z792 Long term (current) use of antibiotics: Secondary | ICD-10-CM | POA: Insufficient documentation

## 2014-04-02 DIAGNOSIS — R51 Headache: Secondary | ICD-10-CM

## 2014-04-02 DIAGNOSIS — R509 Fever, unspecified: Secondary | ICD-10-CM | POA: Diagnosis not present

## 2014-04-02 DIAGNOSIS — Z79899 Other long term (current) drug therapy: Secondary | ICD-10-CM | POA: Diagnosis not present

## 2014-04-02 DIAGNOSIS — R519 Headache, unspecified: Secondary | ICD-10-CM

## 2014-04-02 DIAGNOSIS — Z8673 Personal history of transient ischemic attack (TIA), and cerebral infarction without residual deficits: Secondary | ICD-10-CM | POA: Diagnosis not present

## 2014-04-02 DIAGNOSIS — G43909 Migraine, unspecified, not intractable, without status migrainosus: Secondary | ICD-10-CM | POA: Diagnosis not present

## 2014-04-02 DIAGNOSIS — M810 Age-related osteoporosis without current pathological fracture: Secondary | ICD-10-CM | POA: Diagnosis not present

## 2014-04-02 DIAGNOSIS — F419 Anxiety disorder, unspecified: Secondary | ICD-10-CM | POA: Insufficient documentation

## 2014-04-02 DIAGNOSIS — E785 Hyperlipidemia, unspecified: Secondary | ICD-10-CM | POA: Insufficient documentation

## 2014-04-02 DIAGNOSIS — K219 Gastro-esophageal reflux disease without esophagitis: Secondary | ICD-10-CM | POA: Insufficient documentation

## 2014-04-02 DIAGNOSIS — F329 Major depressive disorder, single episode, unspecified: Secondary | ICD-10-CM | POA: Diagnosis not present

## 2014-04-02 DIAGNOSIS — G8929 Other chronic pain: Secondary | ICD-10-CM | POA: Diagnosis not present

## 2014-04-02 LAB — COMPREHENSIVE METABOLIC PANEL
ALBUMIN: 4.1 g/dL (ref 3.5–5.2)
ALT: 11 U/L (ref 0–35)
ANION GAP: 4 — AB (ref 5–15)
AST: 19 U/L (ref 0–37)
Alkaline Phosphatase: 60 U/L (ref 39–117)
BUN: 49 mg/dL — ABNORMAL HIGH (ref 6–23)
CALCIUM: 9.3 mg/dL (ref 8.4–10.5)
CO2: 26 mmol/L (ref 19–32)
CREATININE: 2.33 mg/dL — AB (ref 0.50–1.10)
Chloride: 107 mmol/L (ref 96–112)
GFR calc Af Amer: 22 mL/min — ABNORMAL LOW (ref >90)
GFR calc non Af Amer: 19 mL/min — ABNORMAL LOW (ref >90)
Glucose, Bld: 131 mg/dL — ABNORMAL HIGH (ref 70–99)
Potassium: 5 mmol/L (ref 3.5–5.1)
Sodium: 137 mmol/L (ref 135–145)
Total Bilirubin: 0.7 mg/dL (ref 0.3–1.2)
Total Protein: 6.8 g/dL (ref 6.0–8.3)

## 2014-04-02 LAB — URINALYSIS, ROUTINE W REFLEX MICROSCOPIC
Bilirubin Urine: NEGATIVE
GLUCOSE, UA: NEGATIVE mg/dL
Hgb urine dipstick: NEGATIVE
KETONES UR: NEGATIVE mg/dL
Leukocytes, UA: NEGATIVE
Nitrite: NEGATIVE
PROTEIN: NEGATIVE mg/dL
Specific Gravity, Urine: 1.006 (ref 1.005–1.030)
UROBILINOGEN UA: 0.2 mg/dL (ref 0.0–1.0)
pH: 6 (ref 5.0–8.0)

## 2014-04-02 LAB — CBC WITH DIFFERENTIAL/PLATELET
Basophils Absolute: 0.1 10*3/uL (ref 0.0–0.1)
Basophils Relative: 1 % (ref 0–1)
EOS PCT: 3 % (ref 0–5)
Eosinophils Absolute: 0.2 10*3/uL (ref 0.0–0.7)
HCT: 34.8 % — ABNORMAL LOW (ref 36.0–46.0)
HEMOGLOBIN: 11.4 g/dL — AB (ref 12.0–15.0)
Lymphocytes Relative: 22 % (ref 12–46)
Lymphs Abs: 1.6 10*3/uL (ref 0.7–4.0)
MCH: 31.1 pg (ref 26.0–34.0)
MCHC: 32.8 g/dL (ref 30.0–36.0)
MCV: 95.1 fL (ref 78.0–100.0)
Monocytes Absolute: 0.6 10*3/uL (ref 0.1–1.0)
Monocytes Relative: 8 % (ref 3–12)
Neutro Abs: 5 10*3/uL (ref 1.7–7.7)
Neutrophils Relative %: 66 % (ref 43–77)
Platelets: 290 10*3/uL (ref 150–400)
RBC: 3.66 MIL/uL — ABNORMAL LOW (ref 3.87–5.11)
RDW: 13.2 % (ref 11.5–15.5)
WBC: 7.5 10*3/uL (ref 4.0–10.5)

## 2014-04-02 LAB — I-STAT CG4 LACTIC ACID, ED: Lactic Acid, Venous: 0.53 mmol/L (ref 0.5–2.0)

## 2014-04-02 NOTE — Telephone Encounter (Signed)
Patient Name: Nicole English  DOB: 18-Feb-1932    Initial Comment [name] states that she has a fever, was diagnosed with pneumonia on 3-1, and finished antibiotics on the the 9th, has congestion, and light cough.    Nurse Assessment  Nurse: Thad Ranger RN, Denise Date/Time (Eastern Time): 04/02/2014 4:41:52 PM  Confirm and document reason for call. If symptomatic, describe symptoms. ---[name] states that she has a fever, was diagnosed with pneumonia on 3-1, and finished antibiotics on the the 9th, has congestion, and light cough. Pt states she does not know what her fever is but she knows she has a fever. Finished Levfloxacin on 03/27/14. States she is SOB w/activity.  Has the patient traveled out of the country within the last 30 days? ---Not Applicable  Does the patient require triage? ---Yes  Related visit to physician within the last 2 weeks? ---Yes  Does the PT have any chronic conditions? (i.e. diabetes, asthma, etc.) ---Yes  List chronic conditions. ---Kidney dysfunction Stage 3     Guidelines    Guideline Title Affirmed Question Affirmed Notes  Pneumonia on Antibiotic Post-Hospitalization Follow-up Call [1] MILD difficulty breathing (e.g., minimal/no SOB at rest, SOB with walking, pulse <100) AND [2] worse than when discharged from hospital Diag with pneumonia on 03/19/14 in ER. Back in ER on 03/22/14 for Hyponatremia. Erring on side of caution d/t age, pneumonia with fever post abx tx and upgrading to be seen within 4 hours.   Final Disposition User   See Physician within 24 Hours Carmon, RN, Langley Gauss    Comments  No selection avail per triage profile to select [facility] to be seen within 4 hours, but this is advice given to the pt. She states she was seen prev at [facility] ER on 3/1 and again on 3/4. Advised it would be best she return to the ER as they have her prev records. Advised there are no appts avail at the [facility] today. Advised to take a list of all meds she takes both Rx and OTC with her to  the ER and have another adult drive her in. Verb understanding.

## 2014-04-02 NOTE — ED Notes (Signed)
Started Levaquin on the 1st for PNA, finished on the 8th.  States that she never felt better but feels like she has been running a fever today and has had headache for the past 3 days.

## 2014-04-02 NOTE — ED Provider Notes (Signed)
CSN: [id number]     Arrival date & time 04/02/14  1802 History   First MD Initiated Contact with Patient 04/02/14 2205     Chief Complaint  Patient presents with  . Fever     (Consider location/radiation/quality/duration/timing/severity/associated sxs/prior Treatment) HPI Nicole English is a 81 y.o. female with hx of anxiety, migraine, htn, CKD, DDD, COPD, presents to ED with complaint of fever, chills, headache. Patient states that her symptoms began 4 days ago. States started with a headache, which is intermittent, comes and goes. States it is gradual in onset, most of time she does not have it in the mornings. States pain is mainly in the right side. Patient states fever started today. States "I had chills and felt hot and cold." States she did not check her temperature. She reports being treated for pneumonia 10 days ago. Finished a course of Levaquin. She states that her symptoms did improve, states her cough is minimal at this point, is nonproductive. Denies any sore throat or congestion. States she is mainly concerned about these new fevers and headache.  Past Medical History  Diagnosis Date  . History of diverticulitis of colon   . HLD (hyperlipidemia)   . Anxiety   . Osteoporosis 08/2013    Spine -2.2, hip -3.5  . Depression   . GERD (gastroesophageal reflux disease)   . IBS (irritable bowel syndrome)   . Glucose intolerance (impaired glucose tolerance)   . Allergic rhinitis   . Migraine   . HTN (hypertension)     Dr. Martinique, cards  . SUI (stress urinary incontinence, female)     Dr. Amalia Hailey, urology (some urge as well)  . Diverticulosis     colonsocopy 2002 aborted 2/2 tortuous colon and heavy sigmoid diverticulosis  . Dysphagia     EGD 2012 - wnl, s/p esoph dilation  . CKD (chronic kidney disease) stage 4, GFR 15-29 ml/min 12/2012    per pt after brown recluse bite. sees Dr Juleen China, off ACEI, bp ok slightly elevated to maintain renal perfusion  . Diastolic dysfunction  0000000    per echo in November 2012; normal EF  . DDD (degenerative disc disease), lumbar   . 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 History of CVA (cerebrovascular accident) 2012    old per prior head CT   Past Surgical History  Procedure Laterality Date  . Tonsillectomy  1942  . Submucous resection  1962  . Tubal ligation  1973  . Breast enhancement surgery  1982  . Total abdominal hysterectomy  1996    for frequent dysmennorhea  . Macroplastique implantation  03/2008    Dr. Rogers Blocker  . Uretherolysis and removal of macroplastique  09/13/08    Dr. Amalia Hailey  . Ct head limited w/o cm  04/2010    nothing acute, ? old infarcts L internal capsule  . Renal doppler  2009?    simple cysts, wnl  . Colonoscopy  2002    does not want another!  . Esophagogastroduodenoscopy  2012    WNL, s/p esoph dilation (Dr. Candace Cruise, [facility])  . US echocardiography  11/2010    EF 55-60%, mild LAE, mild diastolic dysfunction, normal valves  . Dexa  08/2013    Spine -2.2, hip -3.5   Family History  Problem Relation Age of Onset  . Heart failure Father   . Heart disease Father   . Cancer Father  prostate  . Hypertension Father   . Coronary artery disease Father   . Leukemia Mother     CLL prior   History  Substance Use Topics  . Smoking status: Former Smoker -- 0.50 packs/day for 54 years    Types: Cigarettes    Start date: 01/18/1954    Quit date: 01/19/2008  . Smokeless tobacco: Never Used  . Alcohol Use: 0.0 oz/week    0 Standard drinks or equivalent per week     Comment: 1 1/2-2 glasses white wine/day   OB History    No data available     Review of Systems  Constitutional: Positive for fever and chills.  HENT: Negative for congestion.   Respiratory: Negative for cough, chest tightness and shortness of breath.   Cardiovascular: Negative for chest pain, palpitations and leg swelling.  Gastrointestinal: Negative for  nausea, vomiting, abdominal pain and diarrhea.  Genitourinary: Negative for dysuria and flank pain.  Musculoskeletal: Negative for myalgias, arthralgias, neck pain and neck stiffness.  Skin: Negative for rash.  Neurological: Positive for headaches. Negative for dizziness and weakness.  All other systems reviewed and are negative.     Allergies  Amlodipine; Doxycycline monohydrate; and Ramipril  Home Medications   Prior to Admission medications   Medication Sig Start Date End Date Taking? Authorizing Provider  acetaminophen (TYLENOL) 500 MG tablet Take 1,000 mg by mouth 2 (two) times daily as needed for pain.    Yes Historical Provider, MD  ALPRAZolam (XANAX) 0.25 MG tablet TAKE ONE TABLET BY MOUTH TWICE DAILY AS NEEDED. Patient taking differently: TAKE ONE TABLET BY MOUTH TWICE DAILY AS NEEDED FOR ANXIETY. 03/13/14  Yes Ria Bush, MD  aspirin EC 81 MG tablet Take 81 mg by mouth daily.   Yes Historical Provider, MD  calcitRIOL (ROCALTROL) 0.25 MCG capsule Take 0.75 mcg by mouth every Monday, Wednesday, and Friday. M,W,F   Yes Historical Provider, MD  cholecalciferol (VITAMIN D) 1000 UNITS tablet Take 1,000 Units by mouth daily.   Yes Historical Provider, MD  furosemide (LASIX) 20 MG tablet Take 20 mg by mouth 2 (two) times daily.  03/25/14  Yes Ria Bush, MD  gabapentin (NEURONTIN) 100 MG capsule TAKE ONE CAPSULE BY MOUTH IN THE MORNING AND AT NOON, THEN TAKE TWO CAPSULES BY MOUTH AT BEDTIME 02/12/14  Yes Ria Bush, MD  HYDROcodone-acetaminophen (NORCO/VICODIN) 5-325 MG per tablet Take 1 tablet by mouth 2 (two) times daily as needed for moderate pain. 03/13/14  Yes Ria Bush, MD  nebivolol (BYSTOLIC) 5 MG tablet Take 0.5 tablets (2.5 mg total) by mouth daily. 12/07/13  Yes Peter M Martinique, MD  [place] (OYSTER CALCIUM) 500 MG TABS tablet Take 500 mg of elemental calcium by mouth daily. SEA CALCIUM   Yes Historical Provider, MD  sertraline (ZOLOFT) 25 MG tablet  Take 1 tablet (25 mg total) by mouth daily. Patient taking differently: Take 12.5 mg by mouth daily.  09/19/13  Yes Ria Bush, MD  zolpidem (AMBIEN) 5 MG tablet TAKE ONE TABLET BY MOUTH AT BEDTIME. 03/08/14  Yes Ria Bush, MD  famotidine (PEPCID) 20 MG tablet Take 1 tablet (20 mg total) by mouth 2 (two) times daily as needed for heartburn or indigestion. Patient not taking: Reported on 04/02/2014 12/22/13   Wandra Arthurs, MD  levofloxacin (LEVAQUIN) 250 MG tablet Take 1 tablet (250 mg total) by mouth daily. Patient not taking: Reported on 04/02/2014 03/19/14   Jola Schmidt, MD  omeprazole (PRILOSEC) 20 MG capsule Take 1 capsule (  20 mg total) by mouth daily. Patient not taking: Reported on 04/02/2014 12/22/13   Wandra Arthurs, MD  Probiotic Product (ALIGN) 4 MG CAPS Take 1 capsule by mouth daily. Patient not taking: Reported on 03/25/2014 03/01/14   Ria Bush, MD   BP 177/78 mmHg  Pulse 60  Temp(Src) 98.3 F (36.8 C) (Oral)  Resp 23  SpO2 99% Physical Exam  Constitutional: She is oriented to person, place, and time. She appears well-developed and well-nourished. No distress.  HENT:  Head: Normocephalic.  Right Ear: External ear normal.  Left Ear: External ear normal.  Nose: Nose normal.  Mouth/Throat: Oropharynx is clear and moist.  Tenderness to the right temple. No trismus  Eyes: Conjunctivae are normal.  Neck: Normal range of motion. Neck supple.  No meningismus  Cardiovascular: Normal rate, regular rhythm and normal heart sounds.   Pulmonary/Chest: Effort normal and breath sounds normal. No respiratory distress. She has no wheezes. She has no rales. She exhibits no tenderness.  Abdominal: Soft. Bowel sounds are normal. She exhibits no distension. There is no tenderness. There is no rebound.  Musculoskeletal: She exhibits no edema.  Neurological: She is alert and oriented to person, place, and time. No cranial nerve deficit. Coordination normal.  Skin: Skin is warm and dry.   Psychiatric: She has a normal mood and affect. Her behavior is normal.  Nursing note and vitals reviewed.   ED Course  Procedures (including critical care time) Labs Review Labs Reviewed  CBC WITH DIFFERENTIAL/PLATELET - Abnormal; Notable for the following:    RBC 3.66 (*)    Hemoglobin 11.4 (*)    HCT 34.8 (*)    All other components within normal limits  COMPREHENSIVE METABOLIC PANEL - Abnormal; Notable for the following:    Glucose, Bld 131 (*)    BUN 49 (*)    Creatinine, Ser 2.33 (*)    GFR calc non Af Amer 19 (*)    GFR calc Af Amer 22 (*)    Anion gap 4 (*)    All other components within normal limits  SEDIMENTATION RATE  URINALYSIS, ROUTINE W REFLEX MICROSCOPIC  I-STAT CG4 LACTIC ACID, ED    Imaging Review Dg Chest 2 View  04/02/2014   CLINICAL DATA:  Fever today and recent antibiotics for pneumonia. Still not feeling well.  EXAM: CHEST  2 VIEW  COMPARISON:  03/23/2014 and 03/19/2014  FINDINGS: Lungs are adequately inflated without focal consolidation or effusion. Previously noted reticulonodular opacification of the right upper lobe has resolved. Cardiomediastinal silhouette is within normal. There is moderate calcified plaque over the thoracic aorta. Remainder of the exam is unchanged.  IMPRESSION: No acute cardiopulmonary disease.   Electronically Signed   By: Marin Olp M.D.   On: 04/02/2014 19:37     EKG Interpretation None      MDM   Final diagnoses:  Nonintractable headache, unspecified chronicity pattern, unspecified headache type  Secondary hypertension, unspecified    Patient with subjective fever and chills, right-sided headache. Recent treatment with Levaquin for pneumonia. Patient's temperature here is normal, 98.1, however states is very high for her and her normal temperature is 96. Her lab work already obtained and is unremarkable, creatinine is elevated but looks like close to baseline. No elevation in white blood cell count or neutrophils.  Will add sedimentation rate, concerning for temporal arteritis given headache and right temporal pain. Will check UA.`   12:24 AM Patient's urinalysis is clear, her sedimentation rate is normal. She is in  no acute distress on exam she is smiling, very pleasant. No meningismus. Normal neurological exam. Headache is intermittent, did not think she benefits from a CT of her head. Her white blood count cell count, lactic acid, sedimentation rate all normal, doubt any major infectious process. There is no visual changes. Will discharge home with continue Tylenol for her headache, continue Norco, follow-up with her primary care doctor for recheck of soon as able. Patient is comfortable going home. Return precautions discussed.  Filed Vitals:   04/02/14 2211 04/02/14 2215 04/02/14 2230 04/02/14 2300  BP:  173/65 179/75 152/49  Pulse:  58 64 57  Temp: 98.3 F (36.8 C)     TempSrc: Oral     Resp:  12 16 14   SpO2:  96% 100% 98%     Jeannett Senior, PA-C 04/03/14 0025  Alfonzo Beers, MD 04/03/14 0028

## 2014-04-02 NOTE — ED Notes (Signed)
Pt c/o fever today; pt sts recently took antibiotics for PNA; pt sts still not feeling well; pt unsure if fever started today

## 2014-04-03 LAB — SEDIMENTATION RATE: Sed Rate: 14 mm/hr (ref 0–22)

## 2014-04-03 NOTE — Telephone Encounter (Signed)
PLEASE NOTE: All timestamps contained within this report are represented as [place] Standard Time. CONFIDENTIALTY NOTICE: This fax transmission is intended only for the addressee. It contains information that is legally privileged, confidential or otherwise protected from use or disclosure. If you are not the intended recipient, you are strictly prohibited from reviewing, disclosing, copying using or disseminating any of this information or taking any action in reliance on or regarding this information. If you have received this fax in error, please notify us immediately by telephone so that we can arrange for its return to [place]. Phone: [phone], Toll-Free: [phone], Fax: [phone] Page: 1 of 2 Call Id: [id number] [place] Patient Name: Nicole English Gender: Female DOB: [birth year] Age: 81 Y 13 D Return Phone Number: [phone] (Primary) Address: [address] City/State/Zip: [place] [address] Client [place] [facility] - Client Client Site [place], [place] Contact Type Call Call Type Triage / Clinical Relationship To Patient Self Appointment Disposition EMR Appointment Attempted - Not Scheduled Info pasted into Epic Yes Return Phone Number [phone] (Primary) Chief Complaint Cough Initial Comment [name] states that she has a fever, was diagnosed with pneumonia on 3-1, and finished antibiotics on the the 9th, has congestion, and light cough. PreDisposition Call Doctor Nurse Assessment Nurse: Thad Ranger, RN, Langley Gauss Date/Time Eilene Ghazi Time): 04/02/2014 4:41:52 PM Confirm and document reason for call. If symptomatic, describe symptoms. ---[name] states that she has a fever, was diagnosed with pneumonia on 3-1, and finished antibiotics on the the 9th, has congestion, and light cough. Pt states she does not  know what her fever is but she knows she has a fever. Finished Levfloxacin on 03/27/14. States she is SOB w/activity. Has the patient traveled out of the country within the last 30 days? ---Not Applicable Does the patient require triage? ---Yes Related visit to physician within the last 2 weeks? ---Yes Does the PT have any chronic conditions? (i.e. diabetes, asthma, etc.) ---Yes List chronic conditions. ---Kidney dysfunction Stage 3 Guidelines Guideline Title Affirmed Question Affirmed Notes Nurse Date/Time (Eastern Time) Pneumonia on Antibiotic Post-Hospitalization Follow-up Call [1] MILD difficulty breathing (e.g., minimal/ no SOB at rest, SOB with walking, pulse <100) AND [2] worse than when discharged from hospital Diag with pneumonia on 03/19/14 in ER. Back in ER on 03/22/14 for Hyponatremia. Erring on side of caution d/t age, pneumonia with fever post abx tx and upgrading to be seen within 4 hours. Thad Ranger, RN, Langley Gauss 04/02/2014 4:44:30 PM PLEASE NOTE: All timestamps contained within this report are represented as [place] Standard Time. CONFIDENTIALTY NOTICE: This fax transmission is intended only for the addressee. It contains information that is legally privileged, confidential or otherwise protected from use or disclosure. If you are not the intended recipient, you are strictly prohibited from reviewing, disclosing, copying using or disseminating any of this information or taking any action in reliance on or regarding this information. If you have received this fax in error, please notify us immediately by telephone so that we can arrange for its return to [place]. Phone: [phone], Toll-Free: [phone], Fax: [phone] Page: 2 of 2 Call Id: [id number] [place]. Time Eilene Ghazi Time) Disposition Final User 04/02/2014 5:17:15 PM Call Completed Thad Ranger RN, Langley Gauss 04/02/2014 4:47:47 PM See Physician within 24 Hours Yes Carmon, RN, Yevette Edwards Understands: Yes Disagree/Comply:  Comply Care Advice Given Per Guideline SEE PHYSICIAN WITHIN 24 HOURS: CALL BACK IF: * Your  difficulty breathing becomes worse. * You become worse. CARE ADVICE given per Pneumonia Post-Hospitalization Follow-Up Call (Adult) guideline. After Care Instructions Given Call Event Type User Date / Time Description Comments User: Romeo Apple, RN Date/Time Eilene Ghazi Time): 04/02/2014 4:52:56 PM No selection avail per triage profile to select [place] to be seen within 4 hours, but this is advice given to the pt. She states she was seen prev at [facility] ER on 3/1 and again on 3/4. Advised it would be best she return to the ER as they have her prev records. Advised there are no appts avail at the [facility] today. Advised to take a list of all meds she takes both Rx and OTC with her to the ER and have another adult drive her in. Verb understanding. Referrals [facility] - ED

## 2014-04-03 NOTE — Telephone Encounter (Signed)
Seen at ER last night.

## 2014-04-03 NOTE — Discharge Instructions (Signed)
Continue vicodin and tylenol for pain. Follow up with your doctor if continue to have symptoms. Return if worsening.   General Headache Without Cause A headache is pain or discomfort felt around the head or neck area. The specific cause of a headache may not be found. There are many causes and types of headaches. A few common ones are:  Tension headaches.  Migraine headaches.  Cluster headaches.  Chronic daily headaches. HOME CARE INSTRUCTIONS   Keep all follow-up appointments with your caregiver or any specialist referral.  Only take over-the-counter or prescription medicines for pain or discomfort as directed by your caregiver.  Lie down in a dark, quiet room when you have a headache.  Keep a headache journal to find out what may trigger your migraine headaches. For example, write down:  What you eat and drink.  How much sleep you get.  Any change to your diet or medicines.  Try massage or other relaxation techniques.  Put ice packs or heat on the head and neck. Use these 3 to 4 times per day for 15 to 20 minutes each time, or as needed.  Limit stress.  Sit up straight, and do not tense your muscles.  Quit smoking if you smoke.  Limit alcohol use.  Decrease the amount of caffeine you drink, or stop drinking caffeine.  Eat and sleep on a regular schedule.  Get 7 to 9 hours of sleep, or as recommended by your caregiver.  Keep lights dim if bright lights bother you and make your headaches worse. SEEK MEDICAL CARE IF:   You have problems with the medicines you were prescribed.  Your medicines are not working.  You have a change from the usual headache.  You have nausea or vomiting. SEEK IMMEDIATE MEDICAL CARE IF:   Your headache becomes severe.  You have a fever.  You have a stiff neck.  You have loss of vision.  You have muscular weakness or loss of muscle control.  You start losing your balance or have trouble walking.  You feel faint or pass  out.  You have severe symptoms that are different from your first symptoms. MAKE SURE YOU:   Understand these instructions.  Will watch your condition.  Will get help right away if you are not doing well or get worse. Document Released: 01/04/2005 Document Revised: 03/29/2011 Document Reviewed: 01/20/2011 [facility] Patient Information 2015 [place], [place]. This information is not intended to replace advice given to you by your health care provider. Make sure you discuss any questions you have with your health care provider.

## 2014-04-08 ENCOUNTER — Other Ambulatory Visit: Payer: Self-pay | Admitting: Family Medicine

## 2014-04-09 ENCOUNTER — Other Ambulatory Visit: Payer: Self-pay | Admitting: Family Medicine

## 2014-04-09 NOTE — Telephone Encounter (Signed)
Ok to refill 

## 2014-04-09 NOTE — Telephone Encounter (Signed)
Rx called in as directed.   

## 2014-04-09 NOTE — Telephone Encounter (Signed)
plz phone in. 

## 2014-04-10 DIAGNOSIS — I5030 Unspecified diastolic (congestive) heart failure: Secondary | ICD-10-CM | POA: Diagnosis not present

## 2014-04-10 DIAGNOSIS — E875 Hyperkalemia: Secondary | ICD-10-CM | POA: Diagnosis not present

## 2014-04-10 DIAGNOSIS — I129 Hypertensive chronic kidney disease with stage 1 through stage 4 chronic kidney disease, or unspecified chronic kidney disease: Secondary | ICD-10-CM | POA: Diagnosis not present

## 2014-04-10 DIAGNOSIS — D631 Anemia in chronic kidney disease: Secondary | ICD-10-CM | POA: Diagnosis not present

## 2014-04-10 DIAGNOSIS — N184 Chronic kidney disease, stage 4 (severe): Secondary | ICD-10-CM | POA: Diagnosis not present

## 2014-04-10 DIAGNOSIS — N2581 Secondary hyperparathyroidism of renal origin: Secondary | ICD-10-CM | POA: Diagnosis not present

## 2014-04-11 ENCOUNTER — Other Ambulatory Visit: Payer: Self-pay | Admitting: Family Medicine

## 2014-04-11 NOTE — Telephone Encounter (Signed)
plz phone in. 

## 2014-04-11 NOTE — Telephone Encounter (Signed)
Rx called in as directed.   

## 2014-04-19 DIAGNOSIS — R892 Abnormal level of other drugs, medicaments and biological substances in specimens from other organs, systems and tissues: Secondary | ICD-10-CM | POA: Insufficient documentation

## 2014-04-22 ENCOUNTER — Other Ambulatory Visit: Payer: Self-pay | Admitting: *Deleted

## 2014-04-22 ENCOUNTER — Other Ambulatory Visit: Payer: Self-pay | Admitting: Family Medicine

## 2014-04-22 ENCOUNTER — Ambulatory Visit (HOSPITAL_COMMUNITY)
Admission: RE | Admit: 2014-04-22 | Discharge: 2014-04-22 | Disposition: A | Discharge status: Home / Self Care | Payer: Medicare Other | Source: Ambulatory Visit | Attending: Family Medicine | Admitting: Family Medicine

## 2014-04-22 DIAGNOSIS — Z1231 Encounter for screening mammogram for malignant neoplasm of breast: Secondary | ICD-10-CM

## 2014-04-22 DIAGNOSIS — Z9882 Breast implant status: Secondary | ICD-10-CM | POA: Diagnosis not present

## 2014-04-22 LAB — HM MAMMOGRAPHY: HM Mammogram: NORMAL

## 2014-04-22 MED ORDER — HYDROCODONE-ACETAMINOPHEN 5-325 MG PO TABS: 1.0000 | ORAL_TABLET | Freq: Two times a day (BID) | ORAL | Status: DC | PRN

## 2014-04-22 NOTE — Telephone Encounter (Signed)
Patient notified and Rx placed up front for pick up. 

## 2014-04-22 NOTE — Telephone Encounter (Signed)
Printed and in Kim's box 

## 2014-04-22 NOTE — Telephone Encounter (Signed)
Patient left a voicemail stating that she needs a refill on her hydrocodone. Last refill 03/03/14 #60, last office visit 03/25/14. Patient stated that she would like to pick it up today if at all possible.

## 2014-04-23 ENCOUNTER — Encounter: Payer: Self-pay | Admitting: *Deleted

## 2014-04-23 ENCOUNTER — Other Ambulatory Visit (HOSPITAL_COMMUNITY): Payer: Self-pay | Admitting: Cardiology

## 2014-04-23 DIAGNOSIS — I159 Secondary hypertension, unspecified: Secondary | ICD-10-CM

## 2014-04-23 DIAGNOSIS — R55 Syncope and collapse: Secondary | ICD-10-CM

## 2014-04-24 ENCOUNTER — Ambulatory Visit (HOSPITAL_COMMUNITY)
Admission: RE | Admit: 2014-04-24 | Discharge: 2014-04-24 | Disposition: A | Discharge status: Home / Self Care | Payer: Medicare Other | Source: Ambulatory Visit | Attending: Cardiovascular Disease | Admitting: Cardiovascular Disease

## 2014-04-24 DIAGNOSIS — R55 Syncope and collapse: Secondary | ICD-10-CM | POA: Diagnosis not present

## 2014-04-24 NOTE — Progress Notes (Signed)
Carotid duplex completed [place]

## 2014-04-26 DIAGNOSIS — Z85828 Personal history of other malignant neoplasm of skin: Secondary | ICD-10-CM | POA: Diagnosis not present

## 2014-04-26 DIAGNOSIS — L218 Other seborrheic dermatitis: Secondary | ICD-10-CM | POA: Diagnosis not present

## 2014-04-29 ENCOUNTER — Telehealth: Payer: Self-pay | Admitting: Family Medicine

## 2014-04-29 NOTE — Telephone Encounter (Signed)
Spoke with patient about mammo result. She is concerned that one of her implants has "flipped" and that it wasn't picked up on the mammo. Appt scheduled for you to examine and possibly refer to surgeon. She also said she has some enlargement over her liver and would like it checked.

## 2014-04-29 NOTE — Telephone Encounter (Signed)
Pt called regarding letter she got in the mail.  Please return call to pt at [phone] thanks.

## 2014-05-02 ENCOUNTER — Encounter: Payer: Self-pay | Admitting: *Deleted

## 2014-05-02 ENCOUNTER — Encounter: Payer: Self-pay | Admitting: Family Medicine

## 2014-05-02 ENCOUNTER — Ambulatory Visit (INDEPENDENT_AMBULATORY_CARE_PROVIDER_SITE_OTHER): Payer: Medicare Other | Admitting: Family Medicine

## 2014-05-02 VITALS — BP 98/50 | HR 64 | Temp 97.8°F | Wt 124.8 lb

## 2014-05-02 DIAGNOSIS — R197 Diarrhea, unspecified: Secondary | ICD-10-CM | POA: Diagnosis not present

## 2014-05-02 DIAGNOSIS — Z79891 Long term (current) use of opiate analgesic: Secondary | ICD-10-CM | POA: Diagnosis not present

## 2014-05-02 DIAGNOSIS — K589 Irritable bowel syndrome without diarrhea: Secondary | ICD-10-CM | POA: Diagnosis not present

## 2014-05-02 DIAGNOSIS — I10 Essential (primary) hypertension: Secondary | ICD-10-CM

## 2014-05-02 DIAGNOSIS — T8543XS Leakage of breast prosthesis and implant, sequela: Secondary | ICD-10-CM

## 2014-05-02 DIAGNOSIS — T8549XS Other mechanical complication of breast prosthesis and implant, sequela: Secondary | ICD-10-CM

## 2014-05-02 DIAGNOSIS — T8543XA Leakage of breast prosthesis and implant, initial encounter: Secondary | ICD-10-CM | POA: Insufficient documentation

## 2014-05-02 DIAGNOSIS — F419 Anxiety disorder, unspecified: Secondary | ICD-10-CM

## 2014-05-02 DIAGNOSIS — Z79899 Other long term (current) drug therapy: Secondary | ICD-10-CM | POA: Diagnosis not present

## 2014-05-02 MED ORDER — DICYCLOMINE HCL 10 MG PO CAPS: 10.0000 mg | ORAL_CAPSULE | Freq: Two times a day (BID) | ORAL | Status: DC

## 2014-05-02 NOTE — Assessment & Plan Note (Signed)
See above. Trial bentyl for presumed IBS.

## 2014-05-02 NOTE — Progress Notes (Signed)
Pre visit review using our clinic review tool, if applicable. No additional management support is needed unless otherwise documented below in the visit note. 
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
## 2014-05-02 NOTE — Assessment & Plan Note (Signed)
Discussed mammogram studies over last several years with patient. 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 Pt not at point of considering referral to surgery for further evaluation. Discussed monitoring with self breast exams, and will recommend yearly clinical breast exam and mammogram to continue to monitor these findings. Pt agrees with plan, aware to notify us or seek care if acute changes.

## 2014-05-02 NOTE — Assessment & Plan Note (Addendum)
Endorses scheduled xanax twice daily. Discussed abnormal UDS from 12/2013 inappropriately negative for xanax. Will repeat today.

## 2014-05-02 NOTE — Assessment & Plan Note (Signed)
Hypotensive today but pt asxymptomatic. Advised increased water intake. No med changes today.

## 2014-05-02 NOTE — Assessment & Plan Note (Signed)
Reviewed prior unrevealing workup  Anticipate IBS related intermittent diarrhea and cramping abd pain - will trial bentyl prn at noon which is when pt's sxs tend to recur. Previously improved with align - continue this.

## 2014-05-02 NOTE — Patient Instructions (Signed)
UDS today Try bentyl for bowel spasms/diarrhea.  Continue align 4 mg daily. Let's monitor breast implants - if worsening pain or new symptoms let me know.

## 2014-05-07 ENCOUNTER — Other Ambulatory Visit: Payer: Self-pay | Admitting: Family Medicine

## 2014-05-07 NOTE — Telephone Encounter (Signed)
plz phone in. 

## 2014-05-07 NOTE — Telephone Encounter (Signed)
Rx called in as directed.   

## 2014-05-07 NOTE — Telephone Encounter (Signed)
Ok to refill 

## 2014-05-12 ENCOUNTER — Other Ambulatory Visit: Payer: Self-pay | Admitting: Family Medicine

## 2014-05-13 NOTE — Telephone Encounter (Signed)
Rx called in as directed.   

## 2014-05-13 NOTE — Telephone Encounter (Signed)
plz phone in. 

## 2014-05-15 ENCOUNTER — Telehealth: Payer: Self-pay | Admitting: *Deleted

## 2014-05-15 NOTE — Telephone Encounter (Signed)
Noted. Bentyl helped diarrhea, now mildly constipated.

## 2014-05-15 NOTE — Telephone Encounter (Signed)
Patient calling to follow up from 05/02/14 OV.  She has 2 doses left of Bentyl.  She is no longer having diarrhea but instead some constipation.  She is unconcerned and feels as though this may be related to her IBS.  No call back needed, just an FYI.

## 2014-05-21 ENCOUNTER — Other Ambulatory Visit: Payer: Self-pay | Admitting: Family Medicine

## 2014-05-22 ENCOUNTER — Other Ambulatory Visit: Payer: Self-pay

## 2014-05-22 NOTE — Telephone Encounter (Signed)
Pt left v/m requesting rx hydrocodone apap. Call when ready for pick up. Pt last seen 05/02/14; rx #60 last printed 04/22/14. Pt has 2 days of med left.

## 2014-05-23 MED ORDER — HYDROCODONE-ACETAMINOPHEN 5-325 MG PO TABS: 1.0000 | ORAL_TABLET | Freq: Two times a day (BID) | ORAL | Status: DC | PRN

## 2014-05-23 NOTE — Telephone Encounter (Signed)
Patient notified and Rx placed up front for pick up. 

## 2014-05-23 NOTE — Telephone Encounter (Signed)
Printed and in Kim's box 

## 2014-05-26 ENCOUNTER — Encounter: Payer: Self-pay | Admitting: Family Medicine

## 2014-06-07 ENCOUNTER — Other Ambulatory Visit: Payer: Self-pay | Admitting: Family Medicine

## 2014-06-07 NOTE — Telephone Encounter (Signed)
Rx called in as directed.   

## 2014-06-07 NOTE — Telephone Encounter (Signed)
plz phone in. 

## 2014-06-12 ENCOUNTER — Other Ambulatory Visit: Payer: Self-pay | Admitting: Family Medicine

## 2014-06-12 NOTE — Telephone Encounter (Signed)
Rx called in as directed.   

## 2014-06-12 NOTE — Telephone Encounter (Signed)
Last filled #60 on 05/13/14.

## 2014-06-12 NOTE — Telephone Encounter (Signed)
plz phone in. 

## 2014-06-13 ENCOUNTER — Encounter: Payer: Self-pay | Admitting: Cardiology

## 2014-06-13 ENCOUNTER — Ambulatory Visit (INDEPENDENT_AMBULATORY_CARE_PROVIDER_SITE_OTHER): Payer: Medicare Other | Admitting: Cardiology

## 2014-06-13 VITALS — BP 148/79 | HR 58 | Ht 60.0 in | Wt 129.8 lb

## 2014-06-13 DIAGNOSIS — I10 Essential (primary) hypertension: Secondary | ICD-10-CM

## 2014-06-13 DIAGNOSIS — J449 Chronic obstructive pulmonary disease, unspecified: Secondary | ICD-10-CM | POA: Diagnosis not present

## 2014-06-13 DIAGNOSIS — E785 Hyperlipidemia, unspecified: Secondary | ICD-10-CM

## 2014-06-13 DIAGNOSIS — R0609 Other forms of dyspnea: Secondary | ICD-10-CM | POA: Diagnosis not present
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
## 2014-06-13 NOTE — Patient Instructions (Signed)
Continue your current therapy  I will see you in 6 months.   

## 2014-06-14 ENCOUNTER — Telehealth: Payer: Self-pay

## 2014-06-24 ENCOUNTER — Other Ambulatory Visit: Payer: Self-pay

## 2014-06-24 MED ORDER — HYDROCODONE-ACETAMINOPHEN 5-325 MG PO TABS: 1.0000 | ORAL_TABLET | Freq: Two times a day (BID) | ORAL | Status: DC | PRN

## 2014-06-24 NOTE — Telephone Encounter (Signed)
Patient notified and Rx placed up front for pick up. 

## 2014-06-24 NOTE — Telephone Encounter (Signed)
Printed and in Kim's box 

## 2014-06-24 NOTE — Telephone Encounter (Signed)
Pt left v/m requesting rx hydrocodone apap. Call when ready for pick up. Last seen 05/02/14 for f/u appt and last rx printed # 34 on 05/23/14. Pt request to pick up rx 06/24/14.

## 2014-07-07 ENCOUNTER — Other Ambulatory Visit: Payer: Self-pay | Admitting: Family Medicine

## 2014-07-08 ENCOUNTER — Telehealth: Payer: Self-pay | Admitting: Family Medicine

## 2014-07-08 ENCOUNTER — Other Ambulatory Visit: Payer: Self-pay | Admitting: Family Medicine

## 2014-07-08 NOTE — Telephone Encounter (Signed)
Rx called in as directed.   

## 2014-07-08 NOTE — Telephone Encounter (Signed)
plz phone in. 

## 2014-07-08 NOTE — Telephone Encounter (Signed)
[facility] Patient Name: Nicole English Gender: Female DOB: 10-26-1932 Age: 81 Y 58 M 18 D Return Phone Number: [phone] (Primary) Address: [address] City/State/Zip: [place] [address] Client [place] [facility] - Client Client Site [place], [place] Contact Type Call Call Type Triage / Clinical Relationship To Patient Self Appointment Disposition EMR Appointment Not Necessary Info pasted into Epic Yes Return Phone Number [phone] (Primary) Chief Complaint Fever (non urgent symptom) (> THREE MONTHS) Initial Comment [name] states she has low fever and nausea PreDisposition Call Doctor Nurse Assessment Nurse: Mechele Dawley, RN, Amy Date/Time Eilene Ghazi Time): 07/08/2014 3:17:14 PM Confirm and document reason for call. If symptomatic, describe symptoms. ---SHE STATES THAT THE GOT THROUGH A DOORWAY AND SHE SLID AND HIT A THIN CARPET OVER CONCRETE. SHE DID NOT HIT HER HEAD AT ALL. SHE IS HAVING LOW GRADE FEVER AND NAUSEA AND WANTED TO KNOW IF THIS WOULD BE PART OF THE FALL. SHE HAS NOT VOMITED AT ALL. SHE HAS ONLY NAUSEA TODAY. SHE HAD THE FALL A FEW DAYS AGO. SHE HAS LOW GRADE TEMP AND DOES NOT KNOW EXACTLY WHAT IT IS. Has the patient traveled out of the country within the last 30 days? ---Not Applicable Does the patient require triage? ---Yes Related visit to physician within the last 2 weeks? ---No Does the PT have any chronic conditions? (i.e. diabetes, asthma, etc.) ---Yes List chronic conditions. ---KIDNEYS Guidelines Guideline Title Affirmed Question Affirmed Notes Nurse Date/Time (Eastern Time) Nausea Nausea persists > 1 week [place], [place], Amy 07/08/2014 3:18:25 PM Disp. Time Eilene Ghazi Time) Disposition Final User 07/08/2014 3:21:42 PM See Physician within 24 Hours Yes [place], Therapist, sports, Amy Disposition  Overriden: See PCP When Office is Open (within 3 days) Override Reason: Patients symptoms need a higher level of care PLEASE NOTE: All timestamps contained within this report are represented as [place] Standard Time. CONFIDENTIALTY NOTICE: This fax transmission is intended only for the addressee. It contains information that is legally privileged, confidential or otherwise protected from use or disclosure. If you are not the intended recipient, you are strictly prohibited from reviewing, disclosing, copying using or disseminating any of this information or taking any action in reliance on or regarding this information. If you have received this fax in error, please notify us immediately by telephone so that we can arrange for its return to [place]. Phone: [phone], Toll-Free: [phone], Fax: [phone] Page: 2 of 2 Call Id: [id number] [name] Understands: Yes Disagree/Comply: Comply Care Advice Given Per Guideline SEE PHYSICIAN WITHIN 24 HOURS: CLEAR FLUIDS - Take clear fluids in small amounts until the nausea is resolved for 8 hours: CALL BACK IF: * You become worse. CARE ADVICE given per Nausea (Adult) guideline. After Care Instructions Given Call Event Type User Date / Time Description Comments User: Susanne Borders, RN Date/Time Eilene Ghazi Time): 07/08/2014 3:46:55 PM [name] STATES THAT SHE IS GOING TO CALL HER KIDNEY DOCTOR AND GET IN WITH HIM SINCE SHE IS HAVING MORE INCON AND URINARY SYMPTOMS.

## 2014-07-10 DIAGNOSIS — I10 Essential (primary) hypertension: Secondary | ICD-10-CM | POA: Diagnosis not present

## 2014-07-10 DIAGNOSIS — N184 Chronic kidney disease, stage 4 (severe): Secondary | ICD-10-CM | POA: Diagnosis not present

## 2014-07-10 DIAGNOSIS — D631 Anemia in chronic kidney disease: Secondary | ICD-10-CM | POA: Diagnosis not present

## 2014-07-10 DIAGNOSIS — N2581 Secondary hyperparathyroidism of renal origin: Secondary | ICD-10-CM | POA: Diagnosis not present

## 2014-07-10 DIAGNOSIS — R809 Proteinuria, unspecified: Secondary | ICD-10-CM | POA: Diagnosis not present

## 2014-07-11 ENCOUNTER — Other Ambulatory Visit: Payer: Self-pay | Admitting: Family Medicine

## 2014-07-11 NOTE — Telephone Encounter (Signed)
plz phone in. 

## 2014-07-11 NOTE — Telephone Encounter (Signed)
Prescription called to pharmacy.

## 2014-07-25 ENCOUNTER — Other Ambulatory Visit: Payer: Self-pay

## 2014-07-25 MED ORDER — HYDROCODONE-ACETAMINOPHEN 5-325 MG PO TABS: 1.0000 | ORAL_TABLET | Freq: Two times a day (BID) | ORAL | Status: DC | PRN

## 2014-07-25 NOTE — Telephone Encounter (Signed)
Pt left v/m requesting rx hydrocodone apap. Call when ready for pick up.rx last printed # 60 on 06/24/14 and last seen 05/02/14. Pt request to pick up med 07/25/14; pt out of med.

## 2014-07-25 NOTE — Telephone Encounter (Signed)
Printed and in Kim's box 

## 2014-07-25 NOTE — Telephone Encounter (Signed)
Patient notified and Rx placed up front for pick up. 

## 2014-08-07 ENCOUNTER — Other Ambulatory Visit: Payer: Self-pay | Admitting: Family Medicine

## 2014-08-07 NOTE — Telephone Encounter (Signed)
Last filled #30 07/08/14

## 2014-08-07 NOTE — Telephone Encounter (Signed)
Called in Rx to pharmacy.

## 2014-08-07 NOTE — Telephone Encounter (Signed)
Px written for call in   Will refill times one in abs of PCP

## 2014-08-10 ENCOUNTER — Other Ambulatory Visit: Payer: Self-pay | Admitting: Family Medicine

## 2014-08-12 NOTE — Telephone Encounter (Signed)
Dr Darnell Level pt--last filled 07/11/14--please advise

## 2014-08-13 ENCOUNTER — Other Ambulatory Visit: Payer: Self-pay | Admitting: Family Medicine

## 2014-08-13 NOTE — Telephone Encounter (Signed)
Pt called to ck status of alprazolam; advised pt called in today at 8:10 AM. Pt will ck with pharmacy later this AM.

## 2014-08-13 NOTE — Telephone Encounter (Signed)
Refill for alprazolam already done.

## 2014-08-13 NOTE — Telephone Encounter (Signed)
Rx called in to pharmacy. 

## 2014-08-22 ENCOUNTER — Other Ambulatory Visit: Payer: Self-pay

## 2014-08-22 NOTE — Telephone Encounter (Signed)
Pt left v/m requesting rx hydrocodone apap. Call when ready for pick up. rx last printed # 60 on 07/25/14. Last seen 05/02/14. Pt will be out of med on 08/25/14 and wants to pick up by 08/23/14.

## 2014-08-23 MED ORDER — HYDROCODONE-ACETAMINOPHEN 5-325 MG PO TABS: 1.0000 | ORAL_TABLET | Freq: Two times a day (BID) | ORAL | Status: DC | PRN

## 2014-08-23 NOTE — Telephone Encounter (Signed)
Attempted to call patient. No answer, no machine. Will try again later.  

## 2014-08-23 NOTE — Telephone Encounter (Signed)
Printed and in kims'box  

## 2014-08-23 NOTE — Telephone Encounter (Signed)
Patient notified and Rx placed up front for pick up. 

## 2014-08-29 ENCOUNTER — Encounter (HOSPITAL_COMMUNITY): Payer: Self-pay | Admitting: *Deleted

## 2014-08-29 ENCOUNTER — Emergency Department (HOSPITAL_COMMUNITY)
Admission: EM | Admit: 2014-08-29 | Discharge: 2014-08-29 | Disposition: A | Discharge status: Home / Self Care | Payer: Medicare Other | Attending: Emergency Medicine | Admitting: Emergency Medicine

## 2014-08-29 ENCOUNTER — Telehealth: Payer: Self-pay | Admitting: Family Medicine

## 2014-08-29 ENCOUNTER — Emergency Department (HOSPITAL_COMMUNITY): Payer: Medicare Other

## 2014-08-29 DIAGNOSIS — I519 Heart disease, unspecified: Secondary | ICD-10-CM | POA: Insufficient documentation

## 2014-08-29 DIAGNOSIS — Z87448 Personal history of other diseases of urinary system: Secondary | ICD-10-CM | POA: Diagnosis not present

## 2014-08-29 DIAGNOSIS — E785 Hyperlipidemia, unspecified: Secondary | ICD-10-CM | POA: Diagnosis not present

## 2014-08-29 DIAGNOSIS — Z8673 Personal history of transient ischemic attack (TIA), and cerebral infarction without residual deficits: Secondary | ICD-10-CM | POA: Insufficient documentation

## 2014-08-29 DIAGNOSIS — I129 Hypertensive chronic kidney disease with stage 1 through stage 4 chronic kidney disease, or unspecified chronic kidney disease: Secondary | ICD-10-CM | POA: Insufficient documentation

## 2014-08-29 DIAGNOSIS — M810 Age-related osteoporosis without current pathological fracture: Secondary | ICD-10-CM | POA: Diagnosis not present

## 2014-08-29 DIAGNOSIS — Z7982 Long term (current) use of aspirin: Secondary | ICD-10-CM | POA: Diagnosis not present

## 2014-08-29 DIAGNOSIS — F419 Anxiety disorder, unspecified: Secondary | ICD-10-CM | POA: Insufficient documentation

## 2014-08-29 DIAGNOSIS — R209 Unspecified disturbances of skin sensation: Secondary | ICD-10-CM | POA: Diagnosis not present

## 2014-08-29 DIAGNOSIS — Z87891 Personal history of nicotine dependence: Secondary | ICD-10-CM | POA: Diagnosis not present

## 2014-08-29 DIAGNOSIS — R079 Chest pain, unspecified: Secondary | ICD-10-CM | POA: Insufficient documentation

## 2014-08-29 DIAGNOSIS — J449 Chronic obstructive pulmonary disease, unspecified: Secondary | ICD-10-CM | POA: Insufficient documentation

## 2014-08-29 DIAGNOSIS — K589 Irritable bowel syndrome without diarrhea: Secondary | ICD-10-CM | POA: Diagnosis not present

## 2014-08-29 DIAGNOSIS — R0789 Other chest pain: Secondary | ICD-10-CM | POA: Diagnosis not present

## 2014-08-29 DIAGNOSIS — Z79899 Other long term (current) drug therapy: Secondary | ICD-10-CM | POA: Insufficient documentation

## 2014-08-29 DIAGNOSIS — R202 Paresthesia of skin: Secondary | ICD-10-CM | POA: Insufficient documentation

## 2014-08-29 DIAGNOSIS — N184 Chronic kidney disease, stage 4 (severe): Secondary | ICD-10-CM | POA: Diagnosis not present

## 2014-08-29 DIAGNOSIS — R200 Anesthesia of skin: Secondary | ICD-10-CM | POA: Diagnosis present

## 2014-08-29 DIAGNOSIS — F329 Major depressive disorder, single episode, unspecified: Secondary | ICD-10-CM | POA: Diagnosis not present

## 2014-08-29 DIAGNOSIS — I6789 Other cerebrovascular disease: Secondary | ICD-10-CM | POA: Diagnosis not present

## 2014-08-29 LAB — I-STAT TROPONIN, ED
TROPONIN I, POC: 0.01 ng/mL (ref 0.00–0.08)
Troponin i, poc: 0.01 ng/mL (ref 0.00–0.08)

## 2014-08-29 LAB — CBC WITH DIFFERENTIAL/PLATELET
BASOS ABS: 0.1 10*3/uL (ref 0.0–0.1)
Basophils Relative: 1 % (ref 0–1)
EOS ABS: 0.3 10*3/uL (ref 0.0–0.7)
EOS PCT: 5 % (ref 0–5)
HCT: 34.7 % — ABNORMAL LOW (ref 36.0–46.0)
Hemoglobin: 11.2 g/dL — ABNORMAL LOW (ref 12.0–15.0)
Lymphocytes Relative: 27 % (ref 12–46)
Lymphs Abs: 1.9 10*3/uL (ref 0.7–4.0)
MCH: 31.5 pg (ref 26.0–34.0)
MCHC: 32.3 g/dL (ref 30.0–36.0)
MCV: 97.7 fL (ref 78.0–100.0)
Monocytes Absolute: 0.5 10*3/uL (ref 0.1–1.0)
Monocytes Relative: 7 % (ref 3–12)
NEUTROS PCT: 60 % (ref 43–77)
Neutro Abs: 4.1 10*3/uL (ref 1.7–7.7)
PLATELETS: 236 10*3/uL (ref 150–400)
RBC: 3.55 MIL/uL — AB (ref 3.87–5.11)
RDW: 13 % (ref 11.5–15.5)
WBC: 6.8 10*3/uL (ref 4.0–10.5)

## 2014-08-29 LAB — URINALYSIS, ROUTINE W REFLEX MICROSCOPIC
BILIRUBIN URINE: NEGATIVE
GLUCOSE, UA: NEGATIVE mg/dL
Hgb urine dipstick: NEGATIVE
KETONES UR: NEGATIVE mg/dL
Leukocytes, UA: NEGATIVE
Nitrite: NEGATIVE
PH: 5.5 (ref 5.0–8.0)
Protein, ur: NEGATIVE mg/dL
Specific Gravity, Urine: 1.007 (ref 1.005–1.030)
Urobilinogen, UA: 0.2 mg/dL (ref 0.0–1.0)

## 2014-08-29 LAB — BASIC METABOLIC PANEL
ANION GAP: 9 (ref 5–15)
BUN: 49 mg/dL — ABNORMAL HIGH (ref 6–20)
CO2: 24 mmol/L (ref 22–32)
Calcium: 9.2 mg/dL (ref 8.9–10.3)
Chloride: 109 mmol/L (ref 101–111)
Creatinine, Ser: 2.02 mg/dL — ABNORMAL HIGH (ref 0.44–1.00)
GFR calc non Af Amer: 22 mL/min — ABNORMAL LOW (ref >60)
GFR, EST AFRICAN AMERICAN: 26 mL/min — AB (ref >60)
GLUCOSE: 102 mg/dL — AB (ref 65–99)
POTASSIUM: 4.9 mmol/L (ref 3.5–5.1)
SODIUM: 142 mmol/L (ref 135–145)

## 2014-08-29 LAB — MAGNESIUM: Magnesium: 2.3 mg/dL (ref 1.7–2.4)

## 2014-08-29 MED ORDER — SODIUM CHLORIDE 0.9 % IV BOLUS (SEPSIS)
250.0000 mL | Freq: Once | INTRAVENOUS | Status: DC
Start: 2014-08-29 — End: 2014-08-29

## 2014-08-29 MED ORDER — ALUM & MAG HYDROXIDE-SIMETH 200-200-20 MG/5ML PO SUSP
15.0000 mL | Freq: Once | ORAL | Status: DC
Start: 2014-08-29 — End: 2014-08-29
  Filled 2014-08-29: qty 30

## 2014-08-29 MED ORDER — LIDOCAINE VISCOUS 2 % MT SOLN
15.0000 mL | Freq: Once | OROMUCOSAL | Status: DC
  Filled 2014-08-29: qty 15

## 2014-08-29 MED ORDER — GABAPENTIN 100 MG PO CAPS
100.0000 mg | ORAL_CAPSULE | Freq: Once | ORAL | Status: AC
  Administered 2014-08-29: 100 mg via ORAL
  Filled 2014-08-29: qty 1

## 2014-08-29 MED ORDER — NEBIVOLOL HCL 2.5 MG PO TABS
2.5000 mg | ORAL_TABLET | Freq: Every day | ORAL | Status: DC
  Administered 2014-08-29: 2.5 mg via ORAL
  Filled 2014-08-29: qty 1

## 2014-08-29 MED ORDER — FUROSEMIDE 20 MG PO TABS
20.0000 mg | ORAL_TABLET | Freq: Two times a day (BID) | ORAL | Status: DC
  Administered 2014-08-29: 20 mg via ORAL
  Filled 2014-08-29: qty 1

## 2014-08-29 MED ORDER — ALPRAZOLAM 0.25 MG PO TABS
0.2500 mg | ORAL_TABLET | Freq: Two times a day (BID) | ORAL | Status: DC | PRN
  Administered 2014-08-29: 0.25 mg via ORAL
  Filled 2014-08-29: qty 1

## 2014-08-29 NOTE — Telephone Encounter (Signed)
Noted. Agree. Will review ER records. plz call tomorrow for update.

## 2014-08-29 NOTE — ED Provider Notes (Signed)
CSN: [id number]     Arrival date & time 08/29/14  1433 History   First MD Initiated Contact with Patient 08/29/14 1501     Chief Complaint  Patient presents with  . Numbness     (Consider location/radiation/quality/duration/timing/severity/associated sxs/prior Treatment) Patient is a 81 y.o. female presenting with chest pain. The history is provided by the patient.  Chest Pain Pain location:  Substernal area Pain quality: burning   Pain radiates to:  R shoulder Pain radiates to the back: no   Pain severity:  Severe Onset quality:  Sudden Duration:  2 hours Timing:  Rare Progression:  Resolved Chronicity:  Recurrent Associated symptoms: no dizziness, no fever, no headache, no nausea, no palpitations, no shortness of breath and not vomiting    81 yo F the chief complaint of numbness and tingling to the mouth and lips. This started with a pressure in her chest that radiated up to her right shoulder into the back of her head. Patient has had this multiple times over the past year or so. Has been seen here multiple times without etiology. Paresthesias always resolved without intervention. Since that she has chronic neck issues but denies any recent head injury or neck injury. Patient denies any difficulty with speech any difficulty with coordination and movement weakness. Patient denies prior history of stroke. Patient's symptoms have significantly improved since they started 2 hours ago.  Past Medical History  Diagnosis Date  . History of diverticulitis of colon   . HLD (hyperlipidemia)   . Anxiety   . Osteoporosis 08/2013    Spine -2.2, hip -3.5  . Depression   . GERD (gastroesophageal reflux disease)   . IBS (irritable bowel syndrome)   . Glucose intolerance (impaired glucose tolerance)   . Allergic rhinitis   . Migraine   . HTN (hypertension)     Dr. Martinique, cards  . SUI (stress urinary incontinence, female)     Dr. Amalia Hailey, urology (some urge as well)  . Diverticulosis    colonsocopy 2002 aborted 2/2 tortuous colon and heavy sigmoid diverticulosis  . Dysphagia     EGD 2012 - wnl, s/p esoph dilation  . CKD (chronic kidney disease) stage 4, GFR 15-29 ml/min 12/2012    per pt after brown recluse bite. sees Dr Juleen China, off ACEI, bp ok slightly elevated to maintain renal perfusion  . Diastolic dysfunction 0000000    per echo in November 2012; normal EF  . DDD (degenerative disc disease), lumbar   . 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 History of CVA (cerebrovascular accident) 2012    old per prior head CT  . Abnormal drug screen 12/2013    abnormally neg xanax (12/2013) and neg xanax and positive EtOH (04/2014)   Past Surgical History  Procedure Laterality Date  . Tonsillectomy  1942  . Submucous resection  1962  . Tubal ligation  1973  . Breast enhancement surgery  1982  . Total abdominal hysterectomy  1996    for frequent dysmennorhea  . Macroplastique implantation  03/2008    Dr. Rogers Blocker  . Uretherolysis and removal of macroplastique  09/13/08    Dr. Amalia Hailey  . Ct head limited w/o cm  04/2010    nothing acute, ? old infarcts L internal capsule  . Renal doppler  2009?    simple cysts, wnl  . Colonoscopy  2002    does not want another!  Marland Kitchen  Esophagogastroduodenoscopy  2012    WNL, s/p esoph dilation (Dr. Candace Cruise, [facility])  . US echocardiography  11/2010    EF 55-60%, mild LAE, mild diastolic dysfunction, normal valves  . Dexa  08/2013    Spine -2.2, hip -3.5   Family History  Problem Relation Age of Onset  . Heart failure Father   . Heart disease Father   . Cancer Father     prostate  . Hypertension Father   . Coronary artery disease Father   . Leukemia Mother     CLL prior   Social History  Substance Use Topics  . Smoking status: Former Smoker -- 0.50 packs/day for 54 years    Types: Cigarettes    Start date: 01/18/1954    Quit date: 01/19/2008  . Smokeless tobacco:  Never Used  . Alcohol Use: 0.0 oz/week    0 Standard drinks or equivalent per week     Comment: 1 1/2-2 glasses white wine/day   OB History    No data available     Review of Systems  Constitutional: Negative for fever and chills.  HENT: Negative for congestion and rhinorrhea.   Eyes: Negative for redness and visual disturbance.  Respiratory: Negative for shortness of breath and wheezing.   Cardiovascular: Negative for chest pain and palpitations.  Gastrointestinal: Negative for nausea and vomiting.  Genitourinary: Negative for dysuria and urgency.  Musculoskeletal: Negative for myalgias and arthralgias.  Skin: Negative for pallor and wound.  Neurological: Negative for dizziness and headaches.      Allergies  Amlodipine; Doxycycline monohydrate; and Ramipril  Home Medications   Prior to Admission medications   Medication Sig Start Date End Date Taking? Authorizing Provider  acetaminophen (TYLENOL) 500 MG tablet Take 1,000 mg by mouth 2 (two) times daily as needed for pain.    Yes Historical Provider, MD  ALPRAZolam Duanne Moron) 0.25 MG tablet TAKE ONE TABLET BY MOUTH TWICE DAILY AS NEEDED 08/12/14  Yes Pleas Koch, NP  aspirin EC 81 MG tablet Take 81 mg by mouth daily.   Yes Historical Provider, MD  calcitRIOL (ROCALTROL) 0.25 MCG capsule Take 0.25 mcg by mouth 3 (three) times a week.    Yes Historical Provider, MD  cholecalciferol (VITAMIN D) 1000 UNITS tablet Take 1,000 Units by mouth daily.   Yes Historical Provider, MD  furosemide (LASIX) 20 MG tablet Take 20 mg by mouth 2 (two) times daily.  03/25/14  Yes Ria Bush, MD  gabapentin (NEURONTIN) 100 MG capsule TAKE 1 CAPSULE BY MOUTH IN THE MORNING AND AT NOON, THEN TAKE 2 CAPSULES BY MOUTH AT BEDTIME 05/21/14  Yes Ria Bush, MD  HYDROcodone-acetaminophen (NORCO/VICODIN) 5-325 MG per tablet Take 1 tablet by mouth 2 (two) times daily as needed for moderate pain. 08/23/14  Yes Ria Bush, MD  nebivolol (BYSTOLIC)  5 MG tablet Take 0.5 tablets (2.5 mg total) by mouth daily. 12/07/13  Yes Peter M Martinique, MD  Oyster Shell (OYSTER CALCIUM) 500 MG TABS tablet Take 500 mg of elemental calcium by mouth daily. SEA CALCIUM   Yes Historical Provider, MD  sertraline (ZOLOFT) 25 MG tablet TAKE ONE TABLET BY MOUTH ONCE DAILY Patient taking differently: Take 12.5 mg by mouth daily at bedtime 05/07/14  Yes Ria Bush, MD  zolpidem (AMBIEN) 5 MG tablet TAKE ONE TABLET BY MOUTH AT BEDTIME 08/07/14  Yes Abner Greenspan, MD  dicyclomine (BENTYL) 10 MG capsule Take 1 capsule (10 mg total) by mouth 2 (two) times daily before a meal. Patient  not taking: Reported on 08/29/2014 05/02/14   Ria Bush, MD  Probiotic Product (ALIGN) 4 MG CAPS Take 1 capsule by mouth daily. Patient not taking: Reported on 08/29/2014 03/01/14   Ria Bush, MD   BP 172/76 mmHg  Pulse 59  Temp(Src) 98.3 F (36.8 C) (Oral)  Resp 15  Ht 5\' 2"  (1.575 m)  Wt 130 lb (58.968 kg)  BMI 23.77 kg/m2  SpO2 98% Physical Exam  Constitutional: She is oriented to person, place, and time. She appears well-developed and well-nourished. No distress.  HENT:  Head: Normocephalic and atraumatic.    Eyes: EOM are normal. Pupils are equal, round, and reactive to light.  Neck: Normal range of motion. Neck supple.  Cardiovascular: Normal rate and regular rhythm.  Exam reveals no gallop and no friction rub.   No murmur heard. Pulmonary/Chest: Effort normal. She has no wheezes. She has no rales.  Abdominal: Soft. She exhibits no distension. There is no tenderness.  Musculoskeletal: She exhibits no edema or tenderness.  Neurological: She is alert and oriented to person, place, and time. She has normal strength. No cranial nerve deficit or sensory deficit. She displays a negative Romberg sign. Coordination and gait normal. GCS eye subscore is 4. GCS verbal subscore is 5. GCS motor subscore is 6. She displays no Babinski's sign on the right side. She displays  no Babinski's sign on the left side.  Reflex Scores:      Tricep reflexes are 2+ on the right side and 2+ on the left side.      Bicep reflexes are 2+ on the right side and 2+ on the left side.      Brachioradialis reflexes are 2+ on the right side and 2+ on the left side.      Patellar reflexes are 2+ on the right side and 2+ on the left side.      Achilles reflexes are 2+ on the right side and 2+ on the left side. Benign neuro exam  Skin: Skin is warm and dry. She is not diaphoretic.  Psychiatric: She has a normal mood and affect. Her behavior is normal.    ED Course  Procedures (including critical care time) Labs Review Labs Reviewed  CBC WITH DIFFERENTIAL/PLATELET - Abnormal; Notable for the following:    RBC 3.55 (*)    Hemoglobin 11.2 (*)    HCT 34.7 (*)    All other components within normal limits  BASIC METABOLIC PANEL - Abnormal; Notable for the following:    Glucose, Bld 102 (*)    BUN 49 (*)    Creatinine, Ser 2.02 (*)    GFR calc non Af Amer 22 (*)    GFR calc Af Amer 26 (*)    All other components within normal limits  MAGNESIUM  URINALYSIS, ROUTINE W REFLEX MICROSCOPIC (NOT AT [facility])  I-STAT TROPOININ, ED  Randolm Idol, ED    Imaging Review Dg Chest 2 View  08/29/2014   CLINICAL DATA:  Chest pain radiating into the neck and jaw today. No known injury.  EXAM: CHEST  2 VIEW  COMPARISON:  PA and lateral chest 04/02/2014 and 03/23/2014.  FINDINGS: The lungs are clear. Heart size is normal. Aortic atherosclerosis is noted. No pneumothorax or pleural effusion is seen. Calcified breast implants are noted. No focal bony abnormality.  IMPRESSION: No acute disease.   Electronically Signed   By: Inge Rise M.D.   On: 08/29/2014 16:02     EKG Interpretation   Date/Time:  Thursday  August 29 2014 14:39:30 EDT Ventricular Rate:  57 PR Interval:  179 QRS Duration: 92 QT Interval:  434 QTC Calculation: 423 R Axis:   69 Text Interpretation:  Sinus rhythm  Confirmed by ZACKOWSKI  MD, SCOTT  ([id number]) on 08/29/2014 2:58:20 PM      MDM   Final diagnoses:  Paresthesia  Chest pain, unspecified chest pain type    81 yo F with a chief complaint of perioral numbness. This started with a pressure in her chest that radiated up to her right shoulder. Started while she was lying back flat after drinking coffee laying down in bed. Reproducible with palpation. Patient low risk by hear score. Will obtain delta troponin. This occurred with the patient multiple times in the recent past. No known etiology. Was resolved on their own. Benign neuro exam. Crosses midline not a normal cva distribution.  Discussed plan with patient patient became concerned sitting this is been going on for years, how come I don't know it's going on. Discussed with patient need for outpatient follow-up.  Patient again stressing that she feels like this is emergent situation and needs be taken care of immediately. Patient is not sure what it exactly should be but is frustrated that I don't know exactly what the answer is. Discussed continued need for follow-up especially with a chronic condition. Discussed limitations of the emergency department. Encouraged returning to the ED for worsening or concerning symptoms.  7:39 PM:  I have discussed the diagnosis/risks/treatment options with the patient and believe the pt to be eligible for discharge home to follow-up with PCP. We also discussed returning to the ED immediately if new or worsening sx occur. We discussed the sx which are most concerning (e.g., sudden worsening symptoms) that necessitate immediate return. Medications administered to the patient during their visit and any new prescriptions provided to the patient are listed below.  Medications given during this visit Medications  sodium chloride 0.9 % bolus 250 mL (250 mLs Intravenous Not Given 08/29/14 1817)  lidocaine (XYLOCAINE) 2 % viscous mouth solution 15 mL (15 mLs Mouth/Throat  Not Given 08/29/14 1547)  alum & mag hydroxide-simeth (MAALOX/MYLANTA) 200-200-20 MG/5ML suspension 15 mL (15 mLs Oral Not Given 08/29/14 1547)  nebivolol (BYSTOLIC) tablet 2.5 mg (2.5 mg Oral Given 08/29/14 1840)  furosemide (LASIX) tablet 20 mg (20 mg Oral Given 08/29/14 1816)  ALPRAZolam (XANAX) tablet 0.25 mg (0.25 mg Oral Given 08/29/14 1816)  gabapentin (NEURONTIN) capsule 100 mg (100 mg Oral Given 08/29/14 1840)    New Prescriptions   No medications on file     The patient appears reasonably screen and/or stabilized for discharge and I doubt any other medical condition or other [facility] requiring further screening, evaluation, or treatment in the ED at this time prior to discharge.    Deno Etienne, DO 08/29/14 1939

## 2014-08-29 NOTE — ED Notes (Signed)
Pt presents via [facility] with c/o numbness and tingling to her mouth and lips and pressure to the back of her head, neck, and tongue.  No neuro deficits noted, pt ambulatory on arrival.  EKG-unremarkable BP-150/102 P-56 O2-98% RA, CBG-94.  Hx: migraines, CVA.  Pt a x 4, NAD.

## 2014-08-29 NOTE — ED Notes (Signed)
Pt ambulated to restroom with steady gait.

## 2014-08-29 NOTE — Discharge Instructions (Signed)

## 2014-08-29 NOTE — Telephone Encounter (Signed)
Patient Name: Nicole English DOB: 12-Jul-1932 Initial Comment [name] states she feels strange sensations in her face and she is having trouble putting words together. Nurse Assessment Nurse: Marcelline Deist, RN, Lynda Date/Time (Eastern Time): 08/29/2014 1:23:16 PM Confirm and document reason for call. If symptomatic, describe symptoms. ---[name] states she feels strange sensations around the mouth, has tingly & dull pressure in roof of mouth. She is having trouble putting words together. Had some pressure in her chest or back as well. Has the patient traveled out of the country within the last 30 days? ---Not Applicable Does the patient require triage? ---Yes Related visit to physician within the last 2 weeks? ---No Does the PT have any chronic conditions? (i.e. diabetes, asthma, etc.) ---Yes List chronic conditions. ---BP rx Guidelines Guideline Title Affirmed Question Affirmed Notes Chest Pain Dizziness or lightheadedness Final Disposition User Call EMS [facility], RN, Kermit Balo Comments [name] states she had a pressure either in her back or chest that lasted < 5 mins. & felt she was going to pass out. Also has the sensation in her mouth. Nurse upgraded outcome to call 911. Some of her symptoms are not there now, but seemed acute & serious. Disagree/Comply: Comply

## 2014-08-30 NOTE — Telephone Encounter (Signed)
Spoke with patient. She said the numbness has mostly gone, but still has some slight tingling. Her back and shoulders have a lot of pain. I made her a follow up for next week because she does want to see you in reference to her symptoms. She said she was never given any test results.   She wanted to make you aware that the ER physician was very rude to her before ever examining her and basically told her there was no reason for her to be in the ER and that whatever was wrong with her was something that you should've taken care of. He said that all we do is send people to the ER for them to figure out people's problems when it's our job to that.

## 2014-08-30 NOTE — Telephone Encounter (Signed)
Noted  

## 2014-09-03 ENCOUNTER — Encounter: Payer: Self-pay | Admitting: Family Medicine

## 2014-09-03 ENCOUNTER — Ambulatory Visit (INDEPENDENT_AMBULATORY_CARE_PROVIDER_SITE_OTHER): Payer: Medicare Other | Admitting: Family Medicine

## 2014-09-03 VITALS — BP 130/70 | HR 62 | Temp 97.6°F | Wt 130.0 lb

## 2014-09-03 DIAGNOSIS — I10 Essential (primary) hypertension: Secondary | ICD-10-CM

## 2014-09-03 DIAGNOSIS — Z7289 Other problems related to lifestyle: Secondary | ICD-10-CM

## 2014-09-03 DIAGNOSIS — F32A Depression, unspecified: Secondary | ICD-10-CM

## 2014-09-03 DIAGNOSIS — G8929 Other chronic pain: Secondary | ICD-10-CM

## 2014-09-03 DIAGNOSIS — F419 Anxiety disorder, unspecified: Secondary | ICD-10-CM

## 2014-09-03 DIAGNOSIS — M549 Dorsalgia, unspecified: Secondary | ICD-10-CM

## 2014-09-03 DIAGNOSIS — Z79899 Other long term (current) drug therapy: Secondary | ICD-10-CM | POA: Diagnosis not present

## 2014-09-03 DIAGNOSIS — F329 Major depressive disorder, single episode, unspecified: Secondary | ICD-10-CM | POA: Diagnosis not present

## 2014-09-03 DIAGNOSIS — Z79891 Long term (current) use of opiate analgesic: Secondary | ICD-10-CM | POA: Diagnosis not present

## 2014-09-03 DIAGNOSIS — G4700 Insomnia, unspecified: Secondary | ICD-10-CM

## 2014-09-03 DIAGNOSIS — R892 Abnormal level of other drugs, medicaments and biological substances in specimens from other organs, systems and tissues: Secondary | ICD-10-CM

## 2014-09-03 DIAGNOSIS — N184 Chronic kidney disease, stage 4 (severe): Secondary | ICD-10-CM

## 2014-09-03 DIAGNOSIS — R202 Paresthesia of skin: Secondary | ICD-10-CM

## 2014-09-03 DIAGNOSIS — F1099 Alcohol use, unspecified with unspecified alcohol-induced disorder: Secondary | ICD-10-CM

## 2014-09-03 DIAGNOSIS — Z789 Other specified health status: Secondary | ICD-10-CM

## 2014-09-03 MED ORDER — SERTRALINE HCL 25 MG PO TABS: 25.0000 mg | ORAL_TABLET | Freq: Every day | ORAL | Status: DC

## 2014-09-03 NOTE — Progress Notes (Signed)
BP 130/70 mmHg  Pulse 62  Temp(Src) 97.6 F (36.4 C) (Tympanic)  Wt 130 lb (58.968 kg)  SpO2 94%   CC: ER f/u visit  Subjective:    Patient ID: Nicole English, female    DOB: [birth year], 81 y.o.   MRN: [id number]  HPI: Nicole English is a 82 y.o. female presenting on 09/03/2014 for ED follow-up   Recent eval at ER 08/28/2013 for chest pain with perioral paresthesias. Blood work with mild anemia 11.2, Ca and Mg normal, Cr slightly better than baseline at 2.02. TnI normal. UA normal. Pt upset because ER physician told her that she should not have come to ER.   Feels better today. Endorses persistent tingling of tongue and lips as well as improving numbness at roof of teeth. Had never had similar episode.   Chest discomfort described as crawling sensation on skin from mid chest radiating up right neck and spine. She also had some back and shoulder pain described as soreness R>L upper back.  At evaluation - bp was elevated to 170/70 but quickly resolved.   For pain - taking tylenol 1000mg  daily, as well as hydrocodone 5/325mg  1 tab bid.  Anxiety - on sertraline 12.5mg  daily and xanax 0.25mg  bid scheduled.  Relevant past medical, surgical, family and social history reviewed and updated as indicated. Interim medical history since our last visit reviewed. Allergies and medications reviewed and updated. Current Outpatient Prescriptions on File Prior to Visit  Medication Sig  . acetaminophen (TYLENOL) 500 MG tablet Take 1,000 mg by mouth 2 (two) times daily as needed for pain.   Marland Kitchen ALPRAZolam (XANAX) 0.25 MG tablet TAKE ONE TABLET BY MOUTH TWICE DAILY AS NEEDED  . aspirin EC 81 MG tablet Take 81 mg by mouth daily.  . calcitRIOL (ROCALTROL) 0.25 MCG capsule Take 0.25 mcg by mouth 3 (three) times a week.   . cholecalciferol (VITAMIN D) 1000 UNITS tablet Take 1,000 Units by mouth daily.  . furosemide (LASIX) 20 MG tablet Take 20 mg by mouth 2 (two) times daily.   Marland Kitchen gabapentin (NEURONTIN) 100  MG capsule TAKE 1 CAPSULE BY MOUTH IN THE MORNING AND AT NOON, THEN TAKE 2 CAPSULES BY MOUTH AT BEDTIME  . HYDROcodone-acetaminophen (NORCO/VICODIN) 5-325 MG per tablet Take 1 tablet by mouth 2 (two) times daily as needed for moderate pain.  . nebivolol (BYSTOLIC) 5 MG tablet Take 0.5 tablets (2.5 mg total) by mouth daily.  Loma Boston (OYSTER CALCIUM) 500 MG TABS tablet Take 500 mg of elemental calcium by mouth daily. SEA CALCIUM  . zolpidem (AMBIEN) 5 MG tablet TAKE ONE TABLET BY MOUTH AT BEDTIME   No current facility-administered medications on file prior to visit.    Review of Systems Per HPI unless specifically indicated above     Objective:    BP 130/70 mmHg  Pulse 62  Temp(Src) 97.6 F (36.4 C) (Tympanic)  Wt 130 lb (58.968 kg)  SpO2 94%  Wt Readings from Last 3 Encounters:  09/03/14 130 lb (58.968 kg)  08/29/14 130 lb (58.968 kg)  06/13/14 129 lb 12.8 oz (58.877 kg)    Physical Exam  Constitutional: She appears well-developed and well-nourished. No distress.  HENT:  Mouth/Throat: Oropharynx is clear and moist. No oropharyngeal exudate.  Neck: Normal range of motion. Neck supple. No thyromegaly present.  Mild discomfort mildline spine around C7 to palpation  Cardiovascular: Normal rate, regular rhythm, normal heart sounds and intact distal pulses.   No murmur heard. Pulmonary/Chest: Effort  normal and breath sounds normal. No respiratory distress. She has no wheezes. She has no rales.  Musculoskeletal: She exhibits no edema.  Nursing note and vitals reviewed.  Results for orders placed or performed during the hospital encounter of 08/29/14  CBC with Differential  Result Value Ref Range   WBC 6.8 4.0 - 10.5 K/uL   RBC 3.55 (L) 3.87 - 5.11 MIL/uL   Hemoglobin 11.2 (L) 12.0 - 15.0 g/dL   HCT 34.7 (L) 36.0 - 46.0 %   MCV 97.7 78.0 - 100.0 fL   MCH 31.5 26.0 - 34.0 pg   MCHC 32.3 30.0 - 36.0 g/dL   RDW 13.0 11.5 - 15.5 %   Platelets 236 150 - 400 K/uL    Neutrophils Relative % 60 43 - 77 %   Neutro Abs 4.1 1.7 - 7.7 K/uL   Lymphocytes Relative 27 12 - 46 %   Lymphs Abs 1.9 0.7 - 4.0 K/uL   Monocytes Relative 7 3 - 12 %   Monocytes Absolute 0.5 0.1 - 1.0 K/uL   Eosinophils Relative 5 0 - 5 %   Eosinophils Absolute 0.3 0.0 - 0.7 K/uL   Basophils Relative 1 0 - 1 %   Basophils Absolute 0.1 0.0 - 0.1 K/uL  Basic metabolic panel  Result Value Ref Range   Sodium 142 135 - 145 mmol/L   Potassium 4.9 3.5 - 5.1 mmol/L   Chloride 109 101 - 111 mmol/L   CO2 24 22 - 32 mmol/L   Glucose, Bld 102 (H) 65 - 99 mg/dL   BUN 49 (H) 6 - 20 mg/dL   Creatinine, Ser 2.02 (H) 0.44 - 1.00 mg/dL   Calcium 9.2 8.9 - 10.3 mg/dL   GFR calc non Af Amer 22 (L) >60 mL/min   GFR calc Af Amer 26 (L) >60 mL/min   Anion gap 9 5 - 15  Magnesium  Result Value Ref Range   Magnesium 2.3 1.7 - 2.4 mg/dL  Urinalysis, Routine w reflex microscopic (not at [facility])  Result Value Ref Range   Color, Urine YELLOW YELLOW   APPearance CLEAR CLEAR   Specific Gravity, Urine 1.007 1.005 - 1.030   pH 5.5 5.0 - 8.0   Glucose, UA NEGATIVE NEGATIVE mg/dL   Hgb urine dipstick NEGATIVE NEGATIVE   Bilirubin Urine NEGATIVE NEGATIVE   Ketones, ur NEGATIVE NEGATIVE mg/dL   Protein, ur NEGATIVE NEGATIVE mg/dL   Urobilinogen, UA 0.2 0.0 - 1.0 mg/dL   Nitrite NEGATIVE NEGATIVE   Leukocytes, UA NEGATIVE NEGATIVE  I-stat troponin, ED  Result Value Ref Range   Troponin i, poc 0.01 0.00 - 0.08 ng/mL   Comment 3          I-stat troponin, ED  Result Value Ref Range   Troponin i, poc 0.01 0.00 - 0.08 ng/mL   Comment 3              Assessment & Plan:   Problem List Items Addressed This Visit    CKD (chronic kidney disease) stage 4, GFR 15-29 ml/min    Recent Cr at hospital stable.      Insomnia    Pt continues ambien 5mg  nightly.       Anxiety    Pt endorses episode of withdrawal when ran out of xanax a few weeks ago. Discussed danger of stopping benzo cold [place] especially given  how long she's been taking. Increase sertraline to 25mg  daily. Discussed changing xanax 0.25 mg from BID scheduled to BID  PRN. Pt agrees with plan.      Relevant Medications   sertraline (ZOLOFT) 25 MG tablet   Depression    Increase sertraline to 25mg  daily. See below.      Relevant Medications   sertraline (ZOLOFT) 25 MG tablet   HTN (hypertension)    Actually good control noted today.       Chronic back pain    Known lumbar DDD. Treated with hydrocodone BID and tylenol 1000mg  daily.      Abnormal drug screen    Last 2 UDS negative for xanax. Recheck UDS today. Discussed alcohol use, advised not to mix controlled substances with alcohol.      Paresthesias - Primary    Perioral as well as right neck of unclear etiology. Electrolytes including calcium were normal. She does endorse intermittent cervical neck discomfort, ?related.  Improved. Will continue to monitor.      Alcohol use    Daily wine intake. Consider checking B12, thiamine at next lab.          Follow up plan: Return in about 3 months (around 12/04/2014), or as needed, for medicare wellness visit.

## 2014-09-03 NOTE — Patient Instructions (Addendum)
Let's increase sertraline to 25mg  daily - sent to pharmacy.  If doing well with this, may try to change xanax to twice daily as needed.  UDS today.  Good to see you today, call us with questions. Return in 2-3 months for medicare wellness visit

## 2014-09-03 NOTE — Progress Notes (Signed)
Pre visit review using our clinic review tool, if applicable. No additional management support is needed unless otherwise documented below in the visit note. 

## 2014-09-04 ENCOUNTER — Encounter: Payer: Self-pay | Admitting: Family Medicine

## 2014-09-04 DIAGNOSIS — R202 Paresthesia of skin: Secondary | ICD-10-CM | POA: Insufficient documentation

## 2014-09-04 DIAGNOSIS — Z789 Other specified health status: Secondary | ICD-10-CM | POA: Insufficient documentation

## 2014-09-04 DIAGNOSIS — Z7289 Other problems related to lifestyle: Secondary | ICD-10-CM | POA: Insufficient documentation

## 2014-09-04 NOTE — Assessment & Plan Note (Addendum)
Last 2 UDS negative for xanax. Recheck UDS today. Discussed alcohol use, advised not to mix controlled substances with alcohol.

## 2014-09-04 NOTE — Assessment & Plan Note (Signed)
Daily wine intake. Consider checking B12, thiamine at next lab.

## 2014-09-04 NOTE — Assessment & Plan Note (Signed)
Actually good control noted today.

## 2014-09-04 NOTE — Assessment & Plan Note (Signed)
Perioral as well as right neck of unclear etiology. Electrolytes including calcium were normal. She does endorse intermittent cervical neck discomfort, ?related.  Improved. Will continue to monitor.

## 2014-09-04 NOTE — Assessment & Plan Note (Signed)
Increase sertraline to 25mg  daily. See below.

## 2014-09-04 NOTE — Assessment & Plan Note (Signed)
Pt continues ambien 5mg  nightly.

## 2014-09-04 NOTE — Assessment & Plan Note (Signed)
Known lumbar DDD. Treated with hydrocodone BID and tylenol 1000mg  daily.

## 2014-09-04 NOTE — Assessment & Plan Note (Signed)
Recent Cr at hospital stable.

## 2014-09-04 NOTE — Assessment & Plan Note (Signed)
Pt endorses episode of withdrawal when ran out of xanax a few weeks ago. Discussed danger of stopping benzo cold [place] especially given how long she's been taking. Increase sertraline to 25mg  daily. Discussed changing xanax 0.25 mg from BID scheduled to BID PRN. Pt agrees with plan.

## 2014-09-05 ENCOUNTER — Ambulatory Visit: Payer: Medicare Other | Admitting: Family Medicine

## 2014-09-05 ENCOUNTER — Other Ambulatory Visit: Payer: Self-pay | Admitting: Family Medicine

## 2014-09-05 NOTE — Telephone Encounter (Signed)
plz phone in. 

## 2014-09-05 NOTE — Telephone Encounter (Signed)
Rx called in as directed.   

## 2014-09-10 ENCOUNTER — Other Ambulatory Visit: Payer: Self-pay | Admitting: Family Medicine

## 2014-09-10 NOTE — Telephone Encounter (Signed)
Alprazolam 0.25mg  last refilled 08/12/14 for #60 with 0 refills. Last office visit 09/03/14. Okay to refill?

## 2014-09-11 NOTE — Telephone Encounter (Signed)
plz phone in. 

## 2014-09-11 NOTE — Telephone Encounter (Signed)
Rx called in as directed.   

## 2014-09-17 ENCOUNTER — Encounter: Payer: Self-pay | Admitting: Family Medicine

## 2014-09-24 ENCOUNTER — Other Ambulatory Visit: Payer: Self-pay

## 2014-09-24 NOTE — Telephone Encounter (Signed)
Pt left v/m requesting rx hydrocodone apap. Call when ready for pick up. rx last printed # 60 on 08/23/14. Last seen 09/03/14. Pt will be out of med on 09/25/14 and request to pick up on 09/25/14.

## 2014-09-25 MED ORDER — HYDROCODONE-ACETAMINOPHEN 5-325 MG PO TABS: 1.0000 | ORAL_TABLET | Freq: Two times a day (BID) | ORAL | Status: DC | PRN

## 2014-09-25 NOTE — Telephone Encounter (Signed)
Called and notified patient that Rx is ready for pick up. Left in the front office.

## 2014-09-25 NOTE — Telephone Encounter (Signed)
Pt called requesting to pick up hydrocodone apap. Pt is out of med and wants to pick up today. Dr Darnell Level is out of office this afternoon and will request from Dr Damita Dunnings. Pt request cb today and can be at office in 15 mins.

## 2014-09-25 NOTE — Telephone Encounter (Signed)
Printed.  Thanks.  

## 2014-09-26 ENCOUNTER — Encounter: Payer: Self-pay | Admitting: Family Medicine

## 2014-09-27 ENCOUNTER — Encounter: Payer: Self-pay | Admitting: Family Medicine

## 2014-09-27 ENCOUNTER — Ambulatory Visit (INDEPENDENT_AMBULATORY_CARE_PROVIDER_SITE_OTHER): Payer: Medicare Other | Admitting: Family Medicine

## 2014-09-27 VITALS — BP 130/80 | HR 64 | Temp 97.6°F | Wt 130.5 lb

## 2014-09-27 DIAGNOSIS — J019 Acute sinusitis, unspecified: Secondary | ICD-10-CM

## 2014-09-27 DIAGNOSIS — J209 Acute bronchitis, unspecified: Secondary | ICD-10-CM

## 2014-09-27 MED ORDER — AMOXICILLIN-POT CLAVULANATE 875-125 MG PO TABS: 1.0000 | ORAL_TABLET | Freq: Two times a day (BID) | ORAL | Status: DC

## 2014-09-27 MED ORDER — CEFTRIAXONE SODIUM 1 G IJ SOLR
1.0000 g | Freq: Once | INTRAMUSCULAR | Status: AC
  Administered 2014-09-27: 1 g via INTRAMUSCULAR

## 2014-09-27 NOTE — Progress Notes (Signed)
BP 130/80 mmHg  Pulse 64  Temp(Src) 97.6 F (36.4 C) (Oral)  Wt 130 lb 8 oz (59.194 kg)  SpO2 98%   CC: sinusitis?  Subjective:    Patient ID: Nicole English, female    DOB: 1932-04-08, 81 y.o.   MRN: [id number]  HPI: Nicole English is a 81 y.o. female presenting on 09/27/2014 for Sinusitis   10d h/o sinus pressure headache now progressing to sinus drainage, deep chest cough. Mouth feels tender. Mild diarrhea.   Denies fevers/chills, dyspnea or wheezing. No ear or tooth pain or sore throat.   Hasn't tried any medications for this. Taking vit C at home. No sick contacts at home. h/o COPD - states steroids aren't very effective for her. H/o RUL PNA 03/2014.  Granddaughter has stomach bug.   Relevant past medical, surgical, family and social history reviewed and updated as indicated. Interim medical history since our last visit reviewed. Allergies and medications reviewed and updated. Current Outpatient Prescriptions on File Prior to Visit  Medication Sig  . acetaminophen (TYLENOL) 500 MG tablet Take 1,000 mg by mouth 2 (two) times daily as needed for pain.   Marland Kitchen ALPRAZolam (XANAX) 0.25 MG tablet TAKE ONE TABLET BY MOUTH TWICE DAILY AS NEEDED  . aspirin EC 81 MG tablet Take 81 mg by mouth daily.  . calcitRIOL (ROCALTROL) 0.25 MCG capsule Take 0.25 mcg by mouth 3 (three) times a week.   . cholecalciferol (VITAMIN D) 1000 UNITS tablet Take 1,000 Units by mouth daily.  . furosemide (LASIX) 20 MG tablet Take 20 mg by mouth 2 (two) times daily.   Marland Kitchen gabapentin (NEURONTIN) 100 MG capsule TAKE 1 CAPSULE BY MOUTH IN THE MORNING AND AT NOON, THEN TAKE 2 CAPSULES BY MOUTH AT BEDTIME  . HYDROcodone-acetaminophen (NORCO/VICODIN) 5-325 MG per tablet Take 1 tablet by mouth 2 (two) times daily as needed for moderate pain.  . nebivolol (BYSTOLIC) 5 MG tablet Take 0.5 tablets (2.5 mg total) by mouth daily.  Loma Boston (OYSTER CALCIUM) 500 MG TABS tablet Take 500 mg of elemental calcium by mouth  daily. SEA CALCIUM  . sertraline (ZOLOFT) 25 MG tablet Take 1 tablet (25 mg total) by mouth daily.  Marland Kitchen zolpidem (AMBIEN) 5 MG tablet TAKE ONE TABLET BY MOUTH AT BEDTIME   No current facility-administered medications on file prior to visit.    Review of Systems Per HPI unless specifically indicated above     Objective:    BP 130/80 mmHg  Pulse 64  Temp(Src) 97.6 F (36.4 C) (Oral)  Wt 130 lb 8 oz (59.194 kg)  SpO2 98%  Wt Readings from Last 3 Encounters:  09/27/14 130 lb 8 oz (59.194 kg)  09/03/14 130 lb (58.968 kg)  08/29/14 130 lb (58.968 kg)    Physical Exam  Constitutional: She appears well-developed and well-nourished. No distress.  HENT:  Head: Normocephalic and atraumatic.  Right Ear: Hearing, tympanic membrane, external ear and ear canal normal.  Left Ear: Hearing, tympanic membrane, external ear and ear canal normal.  Nose: Mucosal edema present. No rhinorrhea. Right sinus exhibits no maxillary sinus tenderness and no frontal sinus tenderness. Left sinus exhibits no maxillary sinus tenderness and no frontal sinus tenderness.  Mouth/Throat: Uvula is midline, oropharynx is clear and moist and mucous membranes are normal. No oropharyngeal exudate, posterior oropharyngeal edema, posterior oropharyngeal erythema or tonsillar abscesses.  Nasal mucosal pallor  Eyes: Conjunctivae and EOM are normal. Pupils are equal, round, and reactive to light. No scleral icterus.  Neck: Normal range of motion. Neck supple.  Cardiovascular: Normal rate, regular rhythm, normal heart sounds and intact distal pulses.   No murmur heard. Pulmonary/Chest: Effort normal. No respiratory distress. She has no decreased breath sounds. She has no wheezes. She has rhonchi (L>R). She has no rales.  Coarse crackles bilaterally  Lymphadenopathy:    She has no cervical adenopathy.  Skin: Skin is warm and dry. No rash noted.  Nursing note and vitals reviewed.     Assessment & Plan:   Problem List Items  Addressed This Visit    Acute sinusitis - Primary    Given duration and progression of sxs anticipate acute bacterial sinusitis - will treat with augmentin course + CTX 1gm IM today. Push fluids and rest, use expectorant mucinex. Update if not improved, f/u on Monday for recheck.      Relevant Medications   amoxicillin-clavulanate (AUGMENTIN) 875-125 MG per tablet   cefTRIAXone (ROCEPHIN) injection 1 g (Start on 09/27/2014  5:45 PM)   Acute bronchitis    Anticipate developing bronchitis in COPD history but no significant wheeze, good O2 sat, nontoxic - ok to treat outpatient. Treat with augmentin course + CTX 1gm IM today. Push fluids and rest, use expectorant mucinex. Update if not improved, f/u on Monday for recheck. Red flags to seek urgent care discussed.      Relevant Medications   cefTRIAXone (ROCEPHIN) injection 1 g (Start on 09/27/2014  5:45 PM)       Follow up plan: Return if symptoms worsen or fail to improve.

## 2014-09-27 NOTE — Assessment & Plan Note (Signed)
Anticipate developing bronchitis in COPD history but no significant wheeze, good O2 sat, nontoxic - ok to treat outpatient. Treat with augmentin course + CTX 1gm IM today. Push fluids and rest, use expectorant mucinex. Update if not improved, f/u on Monday for recheck. Red flags to seek urgent care discussed.

## 2014-09-27 NOTE — Patient Instructions (Signed)
I think you have sinus infection but may be progressing to bronchitis. Treat with shot of ceftriaxone today then start augmentin antibiotic sent to pharmacy Start plain mucinex with plenty water to help break up mucous. Push lots of fluids and rest. If not feeling better or any worsening, seek care at urgent care over weekend. Return to see me on Monday for a recheck.

## 2014-09-27 NOTE — Progress Notes (Signed)
Pre visit review using our clinic review tool, if applicable. No additional management support is needed unless otherwise documented below in the visit note. 

## 2014-09-27 NOTE — Assessment & Plan Note (Signed)
Given duration and progression of sxs anticipate acute bacterial sinusitis - will treat with augmentin course + CTX 1gm IM today. Push fluids and rest, use expectorant mucinex. Update if not improved, f/u on Monday for recheck.

## 2014-09-30 ENCOUNTER — Ambulatory Visit: Payer: Medicare Other | Admitting: Family Medicine

## 2014-10-03 ENCOUNTER — Other Ambulatory Visit: Payer: Self-pay

## 2014-10-03 NOTE — Telephone Encounter (Signed)
Pt left v/m requesting refill zolpidem to [facility]. Pt request to be called in by 10/04/14. Pt will be out of med over weekend. rx last refilled # 30 on 09/05/14 and last seen 09/03/14.

## 2014-10-04 MED ORDER — ZOLPIDEM TARTRATE 5 MG PO TABS: 5.0000 mg | ORAL_TABLET | Freq: Every day | ORAL | Status: DC

## 2014-10-04 NOTE — Telephone Encounter (Signed)
Rx called in as directed.   

## 2014-10-04 NOTE — Telephone Encounter (Signed)
plz phone in. 

## 2014-10-07 ENCOUNTER — Telehealth: Payer: Self-pay | Admitting: Family Medicine

## 2014-10-07 ENCOUNTER — Ambulatory Visit (INDEPENDENT_AMBULATORY_CARE_PROVIDER_SITE_OTHER): Payer: Medicare Other | Admitting: Family Medicine

## 2014-10-07 ENCOUNTER — Encounter: Payer: Self-pay | Admitting: Family Medicine

## 2014-10-07 VITALS — BP 120/62 | HR 57 | Temp 98.5°F | Ht 62.0 in | Wt 130.8 lb

## 2014-10-07 DIAGNOSIS — J441 Chronic obstructive pulmonary disease with (acute) exacerbation: Secondary | ICD-10-CM | POA: Diagnosis not present

## 2014-10-07 DIAGNOSIS — J209 Acute bronchitis, unspecified: Secondary | ICD-10-CM | POA: Diagnosis not present

## 2014-10-07 MED ORDER — CLARITHROMYCIN 500 MG PO TABS: 500.0000 mg | ORAL_TABLET | Freq: Two times a day (BID) | ORAL | Status: DC

## 2014-10-07 NOTE — Progress Notes (Signed)
Dr. Frederico Hamman T. Copland, MD, [facility] [place], [address] Phone: [phone] Fax: [phone]  10/07/2014  Patient: Nicole English, MRN: [id number], DOB: [birth year], 81 y.o.  Primary Physician:  Ria Bush, MD  Chief Complaint: Cough  Subjective:   Nicole English is a 81 y.o. very pleasant female patient who presents with the following:  81 yo F with 50 year smoking history presents with ongoing bronchitis > 2 weeks in the setting of gold criteria 3 COPD without the use of inhalers. Reports "allergy" to prior inhalers after using x 1 day with hearing an echo.   She was seen by Dr. Darnell Level and placed on Augmentin + Rocephin outpatient 10 days ago. Has continued supportive care without improvement.   Pulse ox 96%  Was on 2 different inhalers, and only used once and got an echo that hung around for a long time.   Past Medical History, Surgical History, Social History, Family History, Problem List, Medications, and Allergies have been reviewed and updated if relevant.  Patient Active Problem List   Diagnosis Date Noted  . Acute sinusitis 09/27/2014  . Acute bronchitis 09/27/2014  . Paresthesias 09/04/2014  . Alcohol use 09/04/2014  . Ruptured silicone breast implant 05/02/2014  . Abnormal drug screen 04/19/2014  . Diarrhea 12/12/2013  . Hip pain, bilateral 06/08/2013  . Chest pain 03/26/2013  . DOE (dyspnea on exertion) 10/27/2012  . Bradycardia 08/18/2012  . Near syncope 04/06/2012  . Chronic back pain 03/29/2012  . COPD GOLD III 10/26/2011  . Diastolic dysfunction   . Medicare annual wellness visit, subsequent 12/09/2010  . Dysphagia 07/10/2010  . History of diverticulitis of colon   . HLD (hyperlipidemia)   . Anxiety   . Depression   . GERD (gastroesophageal reflux disease)   . IBS (irritable bowel syndrome)   . Allergic rhinitis   . Migraine   . HTN (hypertension)   . SUI (stress urinary  incontinence, female)   . CKD (chronic kidney disease) stage 4, GFR 15-29 ml/min 12/01/2009  . VITAMIN D DEFICIENCY 08/21/2008  . Anemia in chronic kidney disease 08/21/2008  . OSTEOPOROSIS 05/19/2007  . Insomnia 05/19/2007    Past Medical History  Diagnosis Date  . History of diverticulitis of colon   . HLD (hyperlipidemia)   . Anxiety   . Osteoporosis 08/2013    Spine -2.2, hip -3.5  . Depression   . GERD (gastroesophageal reflux disease)   . IBS (irritable bowel syndrome)   . Glucose intolerance (impaired glucose tolerance)   . Allergic rhinitis   . Migraine   . HTN (hypertension)     Dr. [place], cards  . SUI (stress urinary incontinence, female)     Dr. Amalia Hailey, urology (some urge as well)  . Diverticulosis     colonsocopy 2002 aborted 2/2 tortuous colon and heavy sigmoid diverticulosis  . Dysphagia     EGD 2012 - wnl, s/p esoph dilation  . CKD (chronic kidney disease) stage 4, GFR 15-29 ml/min 12/2012    per pt after brown recluse bite. sees Dr Juleen China, off ACEI, bp ok slightly elevated to maintain renal perfusion  . Diastolic dysfunction 0000000    per echo in November 2012; normal EF  . DDD (degenerative disc disease), lumbar   . 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 History of CVA (cerebrovascular accident) 2012  old per prior head CT  . Abnormal drug screen 12/2013    abnormally neg xanax (12/2013) and neg xanax and positive EtOH (04/2014), appropriate (08/2014)  . Right upper lobe pneumonia 03/25/2014    Past Surgical History  Procedure Laterality Date  . Tonsillectomy  1942  . Submucous resection  1962  . Tubal ligation  1973  . Breast enhancement surgery  1982  . Total abdominal hysterectomy  1996    for frequent dysmennorhea  . Macroplastique implantation  03/2008    Dr. Rogers Blocker  . Uretherolysis and removal of macroplastique  09/13/08    Dr. Amalia Hailey  . Ct head limited w/o cm  04/2010      nothing acute, ? old infarcts L internal capsule  . Renal doppler  2009?    simple cysts, wnl  . Colonoscopy  2002    does not want another!  . Esophagogastroduodenoscopy  2012    WNL, s/p esoph dilation (Dr. Candace Cruise, [facility])  . US echocardiography  11/2010    EF 55-60%, mild LAE, mild diastolic dysfunction, normal valves  . Dexa  08/2013    Spine -2.2, hip -3.5    Social History   Social History  . Marital Status: Widowed    Spouse Name: N/A  . Number of Children: N/A  . Years of Education: N/A   Occupational History  . Former Personal assistant    Social History Main Topics  . Smoking status: Former Smoker -- 0.50 packs/day for 54 years    Types: Cigarettes    Start date: 01/18/1954    Quit date: 01/19/2008  . Smokeless tobacco: Never Used  . Alcohol Use: 0.0 oz/week    0 Standard drinks or equivalent per week     Comment: 1 1/2-2 glasses white wine/day  . Drug Use: No  . Sexual Activity: Not Currently   Other Topics Concern  . Not on file   Social History Narrative    Family History  Problem Relation Age of Onset  . Heart failure Father   . Heart disease Father   . Cancer Father     prostate  . Hypertension Father   . Coronary artery disease Father   . Leukemia Mother     CLL prior    Allergies  Allergen Reactions  . Amlodipine Other (See Comments)    edema  . Doxycycline Monohydrate Nausea And Vomiting  . Ramipril     Worsening renal function    Medication list reviewed and updated in full in [place].  ROS: GEN: Acute illness details above GI: Tolerating PO intake GU: maintaining adequate hydration and urination Pulm: No SOB Interactive and getting along well at home.  Otherwise, ROS is as per the HPI.   Objective:   BP 120/62 mmHg  Pulse 57  Temp(Src) 98.5 F (36.9 C) (Oral)  Ht 5\' 2"  (1.575 m)  Wt 130 lb 12 oz (59.308 kg)  BMI 23.91 kg/m2  SpO2 96%   GEN: A and O x 3. WDWN. NAD.    ENT: Nose clear, ext NML.  No LAD.   No JVD.  TM's clear. Oropharynx clear.  PULM: Normal WOB, no distress. No crackles, scattered relatively diffuse rhonchi CV: RRR, no M/G/R, No rubs, No JVD.   EXT: warm and well-perfused, No c/c/e. PSYCH: Pleasant and conversant.    Laboratory and Imaging Data:  Assessment and Plan:   COPD exacerbation  Acute bronchitis, unspecified organism   Bronchitis vs atypical pna in setting of copd  exac Normal oxygenation Cover atypicals  Follow-up: prn  New Prescriptions   CLARITHROMYCIN (BIAXIN) 500 MG TABLET    Take 1 tablet (500 mg total) by mouth 2 (two) times daily.   No orders of the defined types were placed in this encounter.    Signed,  Maud Deed. Copland, MD   Patient's Medications  New Prescriptions   CLARITHROMYCIN (BIAXIN) 500 MG TABLET    Take 1 tablet (500 mg total) by mouth 2 (two) times daily.  Previous Medications   ACETAMINOPHEN (TYLENOL) 500 MG TABLET    Take 1,000 mg by mouth daily as needed.    ALPRAZOLAM (XANAX) 0.25 MG TABLET    TAKE ONE TABLET BY MOUTH TWICE DAILY AS NEEDED   ASPIRIN EC 81 MG TABLET    Take 81 mg by mouth daily.   CALCITRIOL (ROCALTROL) 0.25 MCG CAPSULE    Take 0.25 mcg by mouth 3 (three) times a week.    CHOLECALCIFEROL (VITAMIN D) 1000 UNITS TABLET    Take 1,000 Units by mouth daily.   FUROSEMIDE (LASIX) 20 MG TABLET    Take 20 mg by mouth 2 (two) times daily.    GABAPENTIN (NEURONTIN) 100 MG CAPSULE    TAKE 1 CAPSULE BY MOUTH IN THE MORNING AND AT NOON, THEN TAKE 2 CAPSULES BY MOUTH AT BEDTIME   HYDROCODONE-ACETAMINOPHEN (NORCO/VICODIN) 5-325 MG PER TABLET    Take 1 tablet by mouth 2 (two) times daily as needed for moderate pain.   NEBIVOLOL (BYSTOLIC) 5 MG TABLET    Take 0.5 tablets (2.5 mg total) by mouth daily.   OYSTER SHELL (OYSTER CALCIUM) 500 MG TABS TABLET    Take 500 mg of elemental calcium by mouth daily. SEA CALCIUM   SERTRALINE (ZOLOFT) 25 MG TABLET    Take 1 tablet (25 mg total) by mouth daily.   ZOLPIDEM (AMBIEN) 5 MG  TABLET    Take 1 tablet (5 mg total) by mouth at bedtime.  Modified Medications   No medications on file  Discontinued Medications   AMOXICILLIN-CLAVULANATE (AUGMENTIN) 875-125 MG PER TABLET    Take 1 tablet by mouth 2 (two) times daily.

## 2014-10-07 NOTE — Telephone Encounter (Signed)
Patient Name: Nicole English DOB: 06/04/1932 Initial Comment [name] states she is still coughing up stuff after taking medication for bronchitis. Nurse Assessment Nurse: Ronnald Ramp, RN, Miranda Date/Time (Eastern Time): 10/07/2014 12:51:30 PM Confirm and document reason for call. If symptomatic, describe symptoms. ---[name] states she was diagnosed bronchitis 2 weeks ago, she finished her antibiotics 2 nights ago but still having productive cough. Has the patient traveled out of the country within the last 30 days? ---No Does the patient require triage? ---Yes Related visit to physician within the last 2 weeks? ---Yes Does the PT have any chronic conditions? (i.e. diabetes, asthma, etc.) ---Yes List chronic conditions. ---Anxiety, Kidney disease, Allergies, HTN, Depression Guidelines Guideline Title Affirmed Question Affirmed Notes Infection on Antibiotic Follow-up Call [1] Taking antibiotic > 72 hours (3 days) AND [2] symptoms (other than fever) not improved Final Disposition User Call PCP within 24 Hours Jones, RN, Miranda Comments Offered appts tomorrow with Dr. Leo Grosser but pt did not want to wait until tomorrow. Scheduled appt at [facility] today with Dr. Edilia Bo. Referrals REFERRED TO PCP OFFICE

## 2014-10-07 NOTE — Telephone Encounter (Signed)
Pt has appt 10/07/14 at 2 pm with Dr Lorelei Pont.

## 2014-10-07 NOTE — Progress Notes (Signed)
Pre visit review using our clinic review tool, if applicable. No additional management support is needed unless otherwise documented below in the visit note. 

## 2014-10-11 ENCOUNTER — Inpatient Hospital Stay (HOSPITAL_COMMUNITY)
Admission: EM | Admit: 2014-10-11 | Discharge: 2014-10-22 | DRG: 184 | Disposition: A | Discharge status: Skilled Nursing Facility | Payer: No Typology Code available for payment source | Attending: General Surgery | Admitting: General Surgery

## 2014-10-11 ENCOUNTER — Emergency Department (HOSPITAL_COMMUNITY): Payer: No Typology Code available for payment source

## 2014-10-11 ENCOUNTER — Encounter (HOSPITAL_COMMUNITY): Payer: Self-pay | Admitting: Emergency Medicine

## 2014-10-11 DIAGNOSIS — R7302 Impaired glucose tolerance (oral): Secondary | ICD-10-CM | POA: Diagnosis present

## 2014-10-11 DIAGNOSIS — S0003XA Contusion of scalp, initial encounter: Secondary | ICD-10-CM | POA: Diagnosis not present

## 2014-10-11 DIAGNOSIS — N184 Chronic kidney disease, stage 4 (severe): Secondary | ICD-10-CM | POA: Diagnosis present

## 2014-10-11 DIAGNOSIS — S3993XA Unspecified injury of pelvis, initial encounter: Secondary | ICD-10-CM | POA: Diagnosis not present

## 2014-10-11 DIAGNOSIS — S2249XA Multiple fractures of ribs, unspecified side, initial encounter for closed fracture: Secondary | ICD-10-CM | POA: Diagnosis present

## 2014-10-11 DIAGNOSIS — M810 Age-related osteoporosis without current pathological fracture: Secondary | ICD-10-CM | POA: Diagnosis present

## 2014-10-11 DIAGNOSIS — Z79899 Other long term (current) drug therapy: Secondary | ICD-10-CM | POA: Diagnosis not present

## 2014-10-11 DIAGNOSIS — E785 Hyperlipidemia, unspecified: Secondary | ICD-10-CM | POA: Diagnosis present

## 2014-10-11 DIAGNOSIS — R410 Disorientation, unspecified: Secondary | ICD-10-CM

## 2014-10-11 DIAGNOSIS — S2239XA Fracture of one rib, unspecified side, initial encounter for closed fracture: Secondary | ICD-10-CM

## 2014-10-11 DIAGNOSIS — S2241XA Multiple fractures of ribs, right side, initial encounter for closed fracture: Principal | ICD-10-CM

## 2014-10-11 DIAGNOSIS — F419 Anxiety disorder, unspecified: Secondary | ICD-10-CM | POA: Diagnosis present

## 2014-10-11 DIAGNOSIS — Z7982 Long term (current) use of aspirin: Secondary | ICD-10-CM

## 2014-10-11 DIAGNOSIS — F05 Delirium due to known physiological condition: Secondary | ICD-10-CM | POA: Diagnosis not present

## 2014-10-11 DIAGNOSIS — Z23 Encounter for immunization: Secondary | ICD-10-CM

## 2014-10-11 DIAGNOSIS — R079 Chest pain, unspecified: Secondary | ICD-10-CM | POA: Diagnosis not present

## 2014-10-11 DIAGNOSIS — R1310 Dysphagia, unspecified: Secondary | ICD-10-CM | POA: Diagnosis present

## 2014-10-11 DIAGNOSIS — I129 Hypertensive chronic kidney disease with stage 1 through stage 4 chronic kidney disease, or unspecified chronic kidney disease: Secondary | ICD-10-CM | POA: Diagnosis present

## 2014-10-11 DIAGNOSIS — N393 Stress incontinence (female) (male): Secondary | ICD-10-CM | POA: Diagnosis present

## 2014-10-11 DIAGNOSIS — S199XXA Unspecified injury of neck, initial encounter: Secondary | ICD-10-CM | POA: Diagnosis not present

## 2014-10-11 DIAGNOSIS — Y92410 Unspecified street and highway as the place of occurrence of the external cause: Secondary | ICD-10-CM

## 2014-10-11 DIAGNOSIS — R4182 Altered mental status, unspecified: Secondary | ICD-10-CM | POA: Diagnosis not present

## 2014-10-11 DIAGNOSIS — M25511 Pain in right shoulder: Secondary | ICD-10-CM | POA: Diagnosis not present

## 2014-10-11 DIAGNOSIS — R220 Localized swelling, mass and lump, head: Secondary | ICD-10-CM | POA: Diagnosis not present

## 2014-10-11 DIAGNOSIS — J449 Chronic obstructive pulmonary disease, unspecified: Secondary | ICD-10-CM | POA: Diagnosis present

## 2014-10-11 DIAGNOSIS — T148 Other injury of unspecified body region: Secondary | ICD-10-CM | POA: Diagnosis not present

## 2014-10-11 DIAGNOSIS — F329 Major depressive disorder, single episode, unspecified: Secondary | ICD-10-CM | POA: Diagnosis present

## 2014-10-11 DIAGNOSIS — Z87891 Personal history of nicotine dependence: Secondary | ICD-10-CM

## 2014-10-11 DIAGNOSIS — Z8673 Personal history of transient ischemic attack (TIA), and cerebral infarction without residual deficits: Secondary | ICD-10-CM

## 2014-10-11 HISTORY — DX: Multiple fractures of ribs, unspecified side, initial encounter for closed fracture: S22.49XA

## 2014-10-11 LAB — CBC WITH DIFFERENTIAL/PLATELET
Basophils Absolute: 0.1 10*3/uL (ref 0.0–0.1)
Basophils Relative: 1 %
Eosinophils Absolute: 0.2 10*3/uL (ref 0.0–0.7)
Eosinophils Relative: 2 %
HEMATOCRIT: 33.7 % — AB (ref 36.0–46.0)
HEMOGLOBIN: 10.7 g/dL — AB (ref 12.0–15.0)
LYMPHS ABS: 1.4 10*3/uL (ref 0.7–4.0)
LYMPHS PCT: 12 %
MCH: 30.3 pg (ref 26.0–34.0)
MCHC: 31.8 g/dL (ref 30.0–36.0)
MCV: 95.5 fL (ref 78.0–100.0)
Monocytes Absolute: 0.6 10*3/uL (ref 0.1–1.0)
Monocytes Relative: 5 %
NEUTROS ABS: 9.3 10*3/uL — AB (ref 1.7–7.7)
Neutrophils Relative %: 80 %
Platelets: 254 10*3/uL (ref 150–400)
RBC: 3.53 MIL/uL — ABNORMAL LOW (ref 3.87–5.11)
RDW: 13.1 % (ref 11.5–15.5)
WBC: 11.7 10*3/uL — ABNORMAL HIGH (ref 4.0–10.5)

## 2014-10-11 LAB — I-STAT CHEM 8, ED
BUN: 53 mg/dL — AB (ref 6–20)
CALCIUM ION: 1.16 mmol/L (ref 1.13–1.30)
CREATININE: 2.1 mg/dL — AB (ref 0.44–1.00)
Chloride: 107 mmol/L (ref 101–111)
Glucose, Bld: 107 mg/dL — ABNORMAL HIGH (ref 65–99)
HCT: 35 % — ABNORMAL LOW (ref 36.0–46.0)
Hemoglobin: 11.9 g/dL — ABNORMAL LOW (ref 12.0–15.0)
Potassium: 4.6 mmol/L (ref 3.5–5.1)
Sodium: 136 mmol/L (ref 135–145)
TCO2: 19 mmol/L (ref 0–100)

## 2014-10-11 MED ORDER — DIAZEPAM 2 MG PO TABS
2.0000 mg | ORAL_TABLET | Freq: Once | ORAL | Status: AC
  Administered 2014-10-11: 2 mg via ORAL
  Filled 2014-10-11: qty 1

## 2014-10-11 MED ORDER — FENTANYL CITRATE (PF) 100 MCG/2ML IJ SOLN
25.0000 ug | Freq: Once | INTRAMUSCULAR | Status: AC
  Administered 2014-10-11: 25 ug via INTRAVENOUS
  Filled 2014-10-11: qty 2

## 2014-10-11 MED ORDER — HYDROMORPHONE HCL 1 MG/ML IJ SOLN: 1.0000 mg | Freq: Once | INTRAMUSCULAR | Status: DC

## 2014-10-11 MED ORDER — ACETAMINOPHEN 500 MG PO TABS
1000.0000 mg | ORAL_TABLET | Freq: Every day | ORAL | Status: DC | PRN
Start: 2014-10-11 — End: 2014-10-17
  Administered 2014-10-11 – 2014-10-16 (×4): 1000 mg via ORAL
  Filled 2014-10-11 (×4): qty 2

## 2014-10-11 MED ORDER — HYDROMORPHONE HCL 1 MG/ML IJ SOLN
0.5000 mg | Freq: Once | INTRAMUSCULAR | Status: AC
  Administered 2014-10-11: 0.5 mg via INTRAVENOUS
  Filled 2014-10-11: qty 1
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
## 2014-10-11 NOTE — ED Notes (Signed)
Patient taken to CT and xray.

## 2014-10-11 NOTE — ED Notes (Signed)
Patient complaining of headache at this time.  MD notified.
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
## 2014-10-12 LAB — CBC
HCT: 33.2 % — ABNORMAL LOW (ref 36.0–46.0)
Hemoglobin: 10.6 g/dL — ABNORMAL LOW (ref 12.0–15.0)
MCH: 30.5 pg (ref 26.0–34.0)
MCHC: 31.9 g/dL (ref 30.0–36.0)
MCV: 95.7 fL (ref 78.0–100.0)
Platelets: 261 K/uL (ref 150–400)
RBC: 3.47 MIL/uL — ABNORMAL LOW (ref 3.87–5.11)
RDW: 13.2 % (ref 11.5–15.5)
WBC: 9.9 K/uL (ref 4.0–10.5)

## 2014-10-12 LAB — BASIC METABOLIC PANEL
ANION GAP: 10 (ref 5–15)
BUN: 53 mg/dL — ABNORMAL HIGH (ref 6–20)
CALCIUM: 9 mg/dL (ref 8.9–10.3)
CHLORIDE: 101 mmol/L (ref 101–111)
CO2: 20 mmol/L — ABNORMAL LOW (ref 22–32)
CREATININE: 2.07 mg/dL — AB (ref 0.44–1.00)
GFR calc non Af Amer: 21 mL/min — ABNORMAL LOW (ref >60)
GFR, EST AFRICAN AMERICAN: 25 mL/min — AB (ref >60)
Glucose, Bld: 146 mg/dL — ABNORMAL HIGH (ref 65–99)
Potassium: 5.1 mmol/L (ref 3.5–5.1)
SODIUM: 131 mmol/L — AB (ref 135–145)

## 2014-10-12 LAB — MRSA PCR SCREENING: MRSA BY PCR: NEGATIVE

## 2014-10-12 MED ORDER — DEXTROSE-NACL 5-0.45 % IV SOLN
INTRAVENOUS | Status: AC
  Administered 2014-10-12 (×2): via INTRAVENOUS

## 2014-10-12 MED ORDER — SERTRALINE HCL 50 MG PO TABS
25.0000 mg | ORAL_TABLET | Freq: Every day | ORAL | Status: DC
  Administered 2014-10-12 – 2014-10-22 (×11): 25 mg via ORAL
  Filled 2014-10-12 (×11): qty 1

## 2014-10-12 MED ORDER — ALPRAZOLAM 0.25 MG PO TABS
0.2500 mg | ORAL_TABLET | Freq: Two times a day (BID) | ORAL | Status: DC
  Administered 2014-10-12 – 2014-10-15 (×8): 0.25 mg via ORAL
  Filled 2014-10-12 (×8): qty 1

## 2014-10-12 MED ORDER — NEBIVOLOL HCL 2.5 MG PO TABS
2.5000 mg | ORAL_TABLET | Freq: Every day | ORAL | Status: DC
  Administered 2014-10-12 – 2014-10-22 (×11): 2.5 mg via ORAL
  Filled 2014-10-12 (×12): qty 1

## 2014-10-12 MED ORDER — INFLUENZA VAC SPLIT QUAD 0.5 ML IM SUSY
0.5000 mL | PREFILLED_SYRINGE | INTRAMUSCULAR | Status: AC
  Administered 2014-10-14: 0.5 mL via INTRAMUSCULAR
  Filled 2014-10-12: qty 0.5

## 2014-10-12 MED ORDER — HYDROMORPHONE HCL 1 MG/ML IJ SOLN
0.5000 mg | INTRAMUSCULAR | Status: DC | PRN
  Administered 2014-10-12 – 2014-10-13 (×3): 0.5 mg via INTRAVENOUS
  Filled 2014-10-12 (×3): qty 1

## 2014-10-12 MED ORDER — ZOLPIDEM TARTRATE 5 MG PO TABS
5.0000 mg | ORAL_TABLET | Freq: Every day | ORAL | Status: DC
  Administered 2014-10-12 (×2): 5 mg via ORAL
  Filled 2014-10-12 (×3): qty 1

## 2014-10-12 MED ORDER — CALCITRIOL 0.25 MCG PO CAPS
0.2500 ug | ORAL_CAPSULE | ORAL | Status: DC
  Administered 2014-10-14 – 2014-10-21 (×4): 0.25 ug via ORAL
  Filled 2014-10-12 (×7): qty 1

## 2014-10-12 MED ORDER — ONDANSETRON HCL 4 MG PO TABS
4.0000 mg | ORAL_TABLET | Freq: Four times a day (QID) | ORAL | Status: DC | PRN
  Administered 2014-10-15 (×2): 4 mg via ORAL
  Filled 2014-10-12 (×2): qty 1

## 2014-10-12 MED ORDER — FUROSEMIDE 20 MG PO TABS
20.0000 mg | ORAL_TABLET | Freq: Two times a day (BID) | ORAL | Status: DC
  Administered 2014-10-12 – 2014-10-22 (×21): 20 mg via ORAL
  Filled 2014-10-12 (×23): qty 1

## 2014-10-12 MED ORDER — HYDROCODONE-ACETAMINOPHEN 5-325 MG PO TABS
1.0000 | ORAL_TABLET | ORAL | Status: DC | PRN
  Administered 2014-10-12 – 2014-10-13 (×5): 2 via ORAL
  Filled 2014-10-12 (×5): qty 2

## 2014-10-12 MED ORDER — CLARITHROMYCIN 500 MG PO TABS
500.0000 mg | ORAL_TABLET | Freq: Two times a day (BID) | ORAL | Status: DC
  Administered 2014-10-12 – 2014-10-22 (×22): 500 mg via ORAL
  Filled 2014-10-12 (×27): qty 1

## 2014-10-12 MED ORDER — HEPARIN SODIUM (PORCINE) 5000 UNIT/ML IJ SOLN
5000.0000 [IU] | Freq: Three times a day (TID) | INTRAMUSCULAR | Status: DC
  Administered 2014-10-12 – 2014-10-22 (×31): 5000 [IU] via SUBCUTANEOUS
  Filled 2014-10-12 (×30): qty 1

## 2014-10-12 MED ORDER — GABAPENTIN 100 MG PO CAPS
100.0000 mg | ORAL_CAPSULE | Freq: Three times a day (TID) | ORAL | Status: DC
  Administered 2014-10-12 – 2014-10-22 (×32): 100 mg via ORAL
  Filled 2014-10-12 (×34): qty 1

## 2014-10-12 MED ORDER — ONDANSETRON HCL 4 MG/2ML IJ SOLN
4.0000 mg | Freq: Four times a day (QID) | INTRAMUSCULAR | Status: DC | PRN
  Filled 2014-10-12: qty 2

## 2014-10-12 MED ORDER — PANTOPRAZOLE SODIUM 40 MG PO TBEC
40.0000 mg | DELAYED_RELEASE_TABLET | Freq: Every day | ORAL | Status: DC
  Administered 2014-10-12 – 2014-10-14 (×3): 40 mg via ORAL
  Filled 2014-10-12 (×3): qty 1

## 2014-10-12 MED ORDER — DOCUSATE SODIUM 100 MG PO CAPS
100.0000 mg | ORAL_CAPSULE | Freq: Two times a day (BID) | ORAL | Status: DC
  Administered 2014-10-12 – 2014-10-21 (×21): 100 mg via ORAL
  Filled 2014-10-12 (×21): qty 1

## 2014-10-12 MED ORDER — PANTOPRAZOLE SODIUM 40 MG IV SOLR
40.0000 mg | Freq: Every day | INTRAVENOUS | Status: DC
  Filled 2014-10-12 (×2): qty 40

## 2014-10-12 NOTE — Progress Notes (Signed)
Report called to Mechele Claude, RN on [facility] for room 18.
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
## 2014-10-13 MED ORDER — OXYCODONE-ACETAMINOPHEN 5-325 MG PO TABS
1.0000 | ORAL_TABLET | ORAL | Status: DC | PRN
  Administered 2014-10-13 (×2): 1 via ORAL
  Administered 2014-10-13 – 2014-10-14 (×3): 2 via ORAL
  Filled 2014-10-13 (×4): qty 2
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
## 2014-10-14 MED ORDER — NAPROXEN 250 MG PO TABS
500.0000 mg | ORAL_TABLET | Freq: Two times a day (BID) | ORAL | Status: DC
  Administered 2014-10-14 – 2014-10-22 (×16): 500 mg via ORAL
  Filled 2014-10-14 (×16): qty 2

## 2014-10-14 MED ORDER — POLYETHYLENE GLYCOL 3350 17 G PO PACK
17.0000 g | PACK | Freq: Every day | ORAL | Status: DC
  Administered 2014-10-14 – 2014-10-22 (×5): 17 g via ORAL
  Filled 2014-10-14 (×7): qty 1

## 2014-10-14 MED ORDER — TRAMADOL HCL 50 MG PO TABS
100.0000 mg | ORAL_TABLET | Freq: Four times a day (QID) | ORAL | Status: DC
Start: 2014-10-14 — End: 2014-10-15
  Administered 2014-10-14 – 2014-10-15 (×4): 100 mg via ORAL
  Filled 2014-10-14 (×4): qty 2

## 2014-10-14 MED ORDER — HYDROMORPHONE HCL 1 MG/ML IJ SOLN
0.5000 mg | INTRAMUSCULAR | Status: DC | PRN
  Administered 2014-10-17 – 2014-10-21 (×12): 0.5 mg via INTRAVENOUS
  Filled 2014-10-14 (×12): qty 1

## 2014-10-14 MED ORDER — OXYCODONE HCL 5 MG PO TABS
2.5000 mg | ORAL_TABLET | ORAL | Status: DC | PRN
  Administered 2014-10-16: 5 mg via ORAL
  Filled 2014-10-14: qty 1

## 2014-10-14 NOTE — Clinical Documentation Improvement (Signed)
General Surgery  Abnormal Lab/Test Results:  10/12/14: Na= 131  Possible Clinical Conditions associated with below indicators  Hyponatremia  Other Condition  Cannot Clinically Determine  Supporting Information: 10/11/14 Na= 136  Treatment Provided: IVF's   Please exercise your independent, professional judgment when responding. A specific answer is not anticipated or expected.   Thank You,  Rolm Gala, RN, [place] [phone]
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
## 2014-10-14 NOTE — Care Management Important Message (Signed)
Important Message  Patient Details  Name: Nicole English MRN: [id number] Date of Birth: [birth year]   Medicare Important Message Given:  Yes-second notification given    Delorse Lek 10/14/2014, 3:36 PM
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
## 2014-10-15 ENCOUNTER — Inpatient Hospital Stay (HOSPITAL_COMMUNITY): Payer: No Typology Code available for payment source

## 2014-10-15 LAB — URINALYSIS, ROUTINE W REFLEX MICROSCOPIC
Bilirubin Urine: NEGATIVE
Glucose, UA: NEGATIVE mg/dL
Ketones, ur: NEGATIVE mg/dL
NITRITE: NEGATIVE
Protein, ur: NEGATIVE mg/dL
SPECIFIC GRAVITY, URINE: 1.011 (ref 1.005–1.030)
UROBILINOGEN UA: 0.2 mg/dL (ref 0.0–1.0)
pH: 5 (ref 5.0–8.0)

## 2014-10-15 LAB — URINE MICROSCOPIC-ADD ON

## 2014-10-15 MED ORDER — CETYLPYRIDINIUM CHLORIDE 0.05 % MT LIQD
7.0000 mL | Freq: Two times a day (BID) | OROMUCOSAL | Status: DC
  Administered 2014-10-15 – 2014-10-20 (×12): 7 mL via OROMUCOSAL

## 2014-10-15 NOTE — Progress Notes (Signed)
Patient ID: Nicole English, female   DOB: [birth year], 81 y.o.   MRN: [id number] Patient still with some delirium this PM. I met with her 2 sons and daughter in law at the bedside. Likely some medication effect plus possible sundowning. She has had no narcotics for 36h. I will D/C the Ultram and Ambien as well. If she is not improved tomorrow, will consider repeat CT head and or neurology eval. Georganna Skeans, MD, MPH, FACS Trauma: [phone] General Surgery: [phone]

## 2014-10-15 NOTE — Progress Notes (Signed)
Patient has gotten progressively more confused and difficult to arouse throughout the day. Has not received any narcotic pain medication. VSS and NSR on monitor. Dr. Grandville Silos was notified and will come to bedside to see patient and discuss with her family.
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
## 2014-10-16 ENCOUNTER — Inpatient Hospital Stay (HOSPITAL_COMMUNITY): Payer: No Typology Code available for payment source

## 2014-10-16 DIAGNOSIS — R4182 Altered mental status, unspecified: Secondary | ICD-10-CM

## 2014-10-16 LAB — VITAMIN B12: VITAMIN B 12: 862 pg/mL (ref 180–914)

## 2014-10-16 LAB — TSH: TSH: 1.175 u[IU]/mL (ref 0.350–4.500)
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
## 2014-10-17 DIAGNOSIS — R410 Disorientation, unspecified: Secondary | ICD-10-CM | POA: Diagnosis not present

## 2014-10-17 MED ORDER — ACETAMINOPHEN 325 MG PO TABS
650.0000 mg | ORAL_TABLET | Freq: Four times a day (QID) | ORAL | Status: DC | PRN
  Administered 2014-10-19: 650 mg via ORAL
  Filled 2014-10-17: qty 2

## 2014-10-17 MED ORDER — TRAMADOL HCL 50 MG PO TABS
50.0000 mg | ORAL_TABLET | Freq: Four times a day (QID) | ORAL | Status: DC | PRN
  Administered 2014-10-17: 100 mg via ORAL
  Administered 2014-10-17 – 2014-10-18 (×3): 50 mg via ORAL
  Administered 2014-10-19 – 2014-10-22 (×7): 100 mg via ORAL
  Filled 2014-10-17: qty 2
  Filled 2014-10-17 (×2): qty 1
  Filled 2014-10-17: qty 2
  Filled 2014-10-17: qty 1
  Filled 2014-10-17 (×6): qty 2

## 2014-10-17 NOTE — Progress Notes (Signed)
Subjective: Improving. Now able to state where she is, dates and president along with runner up for presidency.   Exam: Filed Vitals:   10/17/14 1000  BP: 148/76  Pulse: 72  Temp:   Resp: 14    HEENT-  Normocephalic, no lesions, without obvious abnormality.  Normal external eye and conjunctiva.  Normal TM's bilaterally.  Normal auditory canals and external ears. Normal external nose, mucus membranes and septum.  Normal pharynx. Cardiovascular- S1, S2 normal, pulses palpable throughout   Lungs- chest clear, no wheezing, rales, normal symmetric air entry     Gen: In bed, NAD MS: alert and oriented to date, year and month. Able to follow commands. Speech clear CN: face equal and symmetric, PERRLA, TML, vision intact. Motor: moving all extremities intact Sensory: intact throughout DTR:2+ throughout  Pertinent Labs: B12 862 TSH 1.175  Etta Quill PA-C [facility] [phone]  Impression: hypoactive delirium improving   Recommendations: 1) no further recommendations. Will S/O at this point.     10/17/2014, 10:17 AM

## 2014-10-17 NOTE — Clinical Social Work Note (Signed)
Clinical Social Worker continuing to follow patient and family for support and discharge planning needs.  CSW spoke with patient son, Nicole English, who states that family plans to provide 24 hour support as needed.  Per PA, awaiting therapies to work with patient and provide appropriate recommendations and need for potential equipment.  Patient family verbalizes comfort in taking patient home with the hopes of potential home health.  CSW available for support if needed, otherwise signing off due to no further social work needs identified.  Barbette Or, [place]
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
## 2014-10-17 NOTE — Care Management Important Message (Signed)
Important Message  Patient Details  Name: Nicole English MRN: [id number] Date of Birth: 1932-04-10   Medicare Important Message Given:  Yes-third notification given    Delorse Lek 10/17/2014, 12:00 PM
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
## 2014-10-18 NOTE — Progress Notes (Signed)
Patient ID: Nicole English, female   DOB: 1932-09-30, 81 y.o.   MRN: [id number]  Spoke with pt's son who said family cannot provide 24h supervision as recommended by PT (at least not today). SW to fax pt out for SNF placement. Will plan on d/c over weekend if family can provide 24h supervision or d/c Monday to SNF or home depending on pt progress and family support.    Lisette Abu, PA-C Pager: [phone] General Trauma PA Pager: [phone]
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
## 2014-10-19 NOTE — Clinical Social Work Note (Signed)
CSW spoke with patient's son Hassell Done who states he is unsure about SNF at this point.  Per son, MD states no dc over the weekend (projected for Monday).  Hassell Done states the family is at a wedding today and will discuss patient's care at time of discharge in family meeting this evening.  CSW will continue to follow and contact son on Monday to determine if family feels they can meet the patient's needs at home or if SNF will be needed.    Nonnie Done, LCSW [phone]  Psychiatric & Orthopedics (5N 1-8) Clinical Social Worker
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
## 2014-10-21 MED ORDER — NAPROXEN 500 MG PO TABS: 500.0000 mg | ORAL_TABLET | Freq: Two times a day (BID) | ORAL | Status: DC

## 2014-10-21 MED ORDER — TRAMADOL HCL 50 MG PO TABS: 50.0000 mg | ORAL_TABLET | Freq: Four times a day (QID) | ORAL | Status: DC | PRN

## 2014-10-21 MED ORDER — DOCUSATE SODIUM 100 MG PO CAPS: 100.0000 mg | ORAL_CAPSULE | Freq: Two times a day (BID) | ORAL | Status: DC
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
## 2014-10-21 NOTE — Care Management Important Message (Signed)
Important Message  Patient Details  Name: Nicole English MRN: [id number] Date of Birth: Jun 14, 1932   Medicare Important Message Given:  Yes-fourth notification given    Delorse Lek 10/21/2014, 1:33 PM
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
## 2014-10-22 NOTE — Progress Notes (Signed)
Report called to [facility]. Talked with Erline Levine
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
## 2014-10-22 NOTE — Progress Notes (Signed)
Pt got up to have a BM and when she wiped had some (minimal) blood from rectum.  Pt states she has hemorrhoids.  It appears bleeding has subsided for right now.  Will continue to monitor. Graceann Congress

## 2014-10-22 NOTE — Clinical Social Work Note (Signed)
Clinical Social Worker facilitated patient discharge including contacting patient family and facility to confirm patient discharge plans.  Clinical information faxed to facility and family agreeable with plan.  CSW arranged transport via family to [facility] [phone]).  RN to call report prior to discharge.  Clinical Social Worker will sign off for now as social work intervention is no longer needed. Please consult [place] again if new need arises.  Barbette Or, [place]

## 2014-10-22 NOTE — Clinical Social Work Placement (Signed)
   CLINICAL SOCIAL WORK PLACEMENT  NOTE  Date:  10/22/2014  Patient Details  Name: MANASI WIEDMANN MRN: [id number] Date of Birth: December 27, 1932  Clinical Social Work is seeking post-discharge placement for this patient at the [place] level of care (*CSW will initial, date and re-position this form in  chart as items are completed):  Yes   Patient/family provided with [place] Work Department's list of facilities offering this level of care within the geographic area requested by the patient (or if unable, by the patient's family).  Yes   Patient/family informed of their freedom to choose among providers that offer the needed level of care, that participate in Medicare, Medicaid or managed care program needed by the patient, have an available bed and are willing to accept the patient.  Yes   Patient/family informed of [facility]'s ownership interest in [facility] and [facility], as well as of the fact that they are under no obligation to receive care at these facilities.  PASRR submitted to EDS on 10/21/14     PASRR number received on 10/22/14     Existing PASRR number confirmed on       FL2 transmitted to all facilities in geographic area requested by pt/family on 10/21/14     FL2 transmitted to all facilities within larger geographic area on       Patient informed that his/her managed care company has contracts with or will negotiate with certain facilities, including the following:        Yes   Patient/family informed of bed offers received.  Patient chooses bed at [facility]     Physician recommends and patient chooses bed at      Patient to be transferred to [facility] on  .  Patient to be transferred to facility by [facility]     Patient family notified on 10/22/14 of transfer.  Name of family member notified:  Richardson Landry and Cora Daniels (Son and Daughter in [place])     PHYSICIAN       Additional Comment:    Barbette Or, [place]

## 2014-10-23 DIAGNOSIS — R5381 Other malaise: Secondary | ICD-10-CM | POA: Diagnosis not present

## 2014-10-23 DIAGNOSIS — M6281 Muscle weakness (generalized): Secondary | ICD-10-CM | POA: Diagnosis not present

## 2014-10-23 DIAGNOSIS — G3184 Mild cognitive impairment, so stated: Secondary | ICD-10-CM | POA: Diagnosis not present

## 2014-10-23 DIAGNOSIS — R262 Difficulty in walking, not elsewhere classified: Secondary | ICD-10-CM | POA: Diagnosis not present

## 2014-10-28 ENCOUNTER — Other Ambulatory Visit: Payer: Self-pay | Admitting: Family Medicine

## 2014-10-28 DIAGNOSIS — E785 Hyperlipidemia, unspecified: Secondary | ICD-10-CM

## 2014-10-28 DIAGNOSIS — B379 Candidiasis, unspecified: Secondary | ICD-10-CM | POA: Diagnosis not present

## 2014-10-28 DIAGNOSIS — J209 Acute bronchitis, unspecified: Secondary | ICD-10-CM | POA: Diagnosis not present

## 2014-10-28 DIAGNOSIS — H81399 Other peripheral vertigo, unspecified ear: Secondary | ICD-10-CM | POA: Diagnosis not present

## 2014-10-28 DIAGNOSIS — M6281 Muscle weakness (generalized): Secondary | ICD-10-CM | POA: Diagnosis not present

## 2014-10-28 DIAGNOSIS — G3184 Mild cognitive impairment, so stated: Secondary | ICD-10-CM | POA: Diagnosis not present

## 2014-10-28 DIAGNOSIS — N184 Chronic kidney disease, stage 4 (severe): Secondary | ICD-10-CM

## 2014-10-30 ENCOUNTER — Other Ambulatory Visit: Payer: Medicare Other

## 2014-10-31 DIAGNOSIS — A881 Epidemic vertigo: Secondary | ICD-10-CM | POA: Diagnosis not present

## 2014-10-31 DIAGNOSIS — R6884 Jaw pain: Secondary | ICD-10-CM | POA: Diagnosis not present

## 2014-10-31 DIAGNOSIS — H9209 Otalgia, unspecified ear: Secondary | ICD-10-CM | POA: Diagnosis not present

## 2014-11-01 ENCOUNTER — Encounter: Payer: Medicare Other | Admitting: Family Medicine

## 2014-11-01 DIAGNOSIS — W19XXXA Unspecified fall, initial encounter: Secondary | ICD-10-CM | POA: Diagnosis not present

## 2014-11-01 DIAGNOSIS — M6281 Muscle weakness (generalized): Secondary | ICD-10-CM | POA: Diagnosis not present

## 2014-11-05 DIAGNOSIS — G3184 Mild cognitive impairment, so stated: Secondary | ICD-10-CM | POA: Diagnosis not present

## 2014-11-05 DIAGNOSIS — M6281 Muscle weakness (generalized): Secondary | ICD-10-CM | POA: Diagnosis not present

## 2014-11-11 DIAGNOSIS — M6281 Muscle weakness (generalized): Secondary | ICD-10-CM | POA: Diagnosis not present

## 2014-11-11 DIAGNOSIS — G3184 Mild cognitive impairment, so stated: Secondary | ICD-10-CM | POA: Diagnosis not present

## 2014-11-14 DIAGNOSIS — M6281 Muscle weakness (generalized): Secondary | ICD-10-CM | POA: Diagnosis not present

## 2014-11-14 DIAGNOSIS — G3184 Mild cognitive impairment, so stated: Secondary | ICD-10-CM | POA: Diagnosis not present

## 2014-11-21 DIAGNOSIS — G3184 Mild cognitive impairment, so stated: Secondary | ICD-10-CM | POA: Diagnosis not present

## 2014-11-21 DIAGNOSIS — M6281 Muscle weakness (generalized): Secondary | ICD-10-CM | POA: Diagnosis not present

## 2014-11-22 DIAGNOSIS — F329 Major depressive disorder, single episode, unspecified: Secondary | ICD-10-CM | POA: Diagnosis not present

## 2014-11-22 DIAGNOSIS — F419 Anxiety disorder, unspecified: Secondary | ICD-10-CM | POA: Diagnosis not present

## 2014-11-25 DIAGNOSIS — A881 Epidemic vertigo: Secondary | ICD-10-CM | POA: Diagnosis not present

## 2014-11-25 DIAGNOSIS — I10 Essential (primary) hypertension: Secondary | ICD-10-CM | POA: Diagnosis not present

## 2014-11-25 DIAGNOSIS — K219 Gastro-esophageal reflux disease without esophagitis: Secondary | ICD-10-CM | POA: Diagnosis not present

## 2014-11-25 DIAGNOSIS — I509 Heart failure, unspecified: Secondary | ICD-10-CM | POA: Diagnosis not present

## 2014-11-25 DIAGNOSIS — J449 Chronic obstructive pulmonary disease, unspecified: Secondary | ICD-10-CM | POA: Diagnosis not present

## 2014-11-25 DIAGNOSIS — F339 Major depressive disorder, recurrent, unspecified: Secondary | ICD-10-CM | POA: Diagnosis not present

## 2014-11-25 DIAGNOSIS — R110 Nausea: Secondary | ICD-10-CM | POA: Diagnosis not present

## 2014-11-26 ENCOUNTER — Telehealth: Payer: Self-pay

## 2014-11-26 NOTE — Telephone Encounter (Signed)
Nicole English - ok to do. Nicole English - plz call for TCM f/u phone call.

## 2014-11-26 NOTE — Telephone Encounter (Signed)
Nicole English with [facility] left v/m requesting verbal orders for home health nursing,PT and OT. Pt being discharged from Skilled nursing today. Would like to start home health on 11/27/14.

## 2014-11-27 ENCOUNTER — Telehealth: Payer: Self-pay | Admitting: *Deleted

## 2014-11-27 NOTE — Telephone Encounter (Signed)
Tara notified.

## 2014-11-27 NOTE — Telephone Encounter (Signed)
Transitional care call done and f/u scheduled 11/18.
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
## 2014-11-28 DIAGNOSIS — F0781 Postconcussional syndrome: Secondary | ICD-10-CM | POA: Diagnosis not present

## 2014-11-28 DIAGNOSIS — M50322 Other cervical disc degeneration at C5-C6 level: Secondary | ICD-10-CM | POA: Diagnosis not present

## 2014-11-28 DIAGNOSIS — J449 Chronic obstructive pulmonary disease, unspecified: Secondary | ICD-10-CM | POA: Diagnosis not present

## 2014-11-28 DIAGNOSIS — I129 Hypertensive chronic kidney disease with stage 1 through stage 4 chronic kidney disease, or unspecified chronic kidney disease: Secondary | ICD-10-CM | POA: Diagnosis not present

## 2014-11-28 DIAGNOSIS — F339 Major depressive disorder, recurrent, unspecified: Secondary | ICD-10-CM | POA: Diagnosis not present

## 2014-11-28 DIAGNOSIS — I5030 Unspecified diastolic (congestive) heart failure: Secondary | ICD-10-CM | POA: Diagnosis not present

## 2014-11-28 DIAGNOSIS — M50323 Other cervical disc degeneration at C6-C7 level: Secondary | ICD-10-CM | POA: Diagnosis not present

## 2014-11-28 DIAGNOSIS — M50321 Other cervical disc degeneration at C4-C5 level: Secondary | ICD-10-CM | POA: Diagnosis not present

## 2014-11-28 DIAGNOSIS — Z9181 History of falling: Secondary | ICD-10-CM | POA: Diagnosis not present

## 2014-11-28 DIAGNOSIS — S2241XD Multiple fractures of ribs, right side, subsequent encounter for fracture with routine healing: Secondary | ICD-10-CM | POA: Diagnosis not present

## 2014-11-28 DIAGNOSIS — R41841 Cognitive communication deficit: Secondary | ICD-10-CM | POA: Diagnosis not present

## 2014-11-28 DIAGNOSIS — M810 Age-related osteoporosis without current pathological fracture: Secondary | ICD-10-CM | POA: Diagnosis not present

## 2014-11-28 DIAGNOSIS — N184 Chronic kidney disease, stage 4 (severe): Secondary | ICD-10-CM | POA: Diagnosis not present

## 2014-11-29 ENCOUNTER — Telehealth: Payer: Self-pay | Admitting: Family Medicine

## 2014-11-29 ENCOUNTER — Telehealth: Payer: Self-pay

## 2014-11-29 NOTE — Telephone Encounter (Signed)
Ok to give verbal order.

## 2014-11-29 NOTE — Telephone Encounter (Signed)
Terri nurse with [facility] left v/m requesting verbal orders for home health 1 x a week for 1 week and 2 x a week for 2 weeks for observation and assessment of pain, medication effectiveness for meclizine and tramadol; vertigo mgt skilled teaching. Needs order for ST for eval for cognitive impairment.

## 2014-11-29 NOTE — Telephone Encounter (Signed)
Renee called to get verbal order for Continue home health OT therapy

## 2014-11-29 NOTE — Telephone Encounter (Signed)
Ok to give verbal ok for this.

## 2014-12-01 NOTE — Telephone Encounter (Signed)
Continue HHOT.

## 2014-12-02 ENCOUNTER — Telehealth: Payer: Self-pay

## 2014-12-02 NOTE — Telephone Encounter (Signed)
Carrie notified.
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
## 2014-12-02 NOTE — Telephone Encounter (Signed)
Ok to do, thanks. 

## 2014-12-02 NOTE — Telephone Encounter (Signed)
OT notified.

## 2014-12-02 NOTE — Telephone Encounter (Signed)
Physical therapist with [facility] left v/m requesting verbal orders for PT home health 1 x a week for 1 week; 2 x a week for 4 weeks and 1 x a week for 1 week.

## 2014-12-03 DIAGNOSIS — J449 Chronic obstructive pulmonary disease, unspecified: Secondary | ICD-10-CM | POA: Diagnosis not present

## 2014-12-03 DIAGNOSIS — F339 Major depressive disorder, recurrent, unspecified: Secondary | ICD-10-CM | POA: Diagnosis not present

## 2014-12-03 DIAGNOSIS — I5030 Unspecified diastolic (congestive) heart failure: Secondary | ICD-10-CM | POA: Diagnosis not present

## 2014-12-03 DIAGNOSIS — M810 Age-related osteoporosis without current pathological fracture: Secondary | ICD-10-CM | POA: Diagnosis not present

## 2014-12-03 DIAGNOSIS — S2241XD Multiple fractures of ribs, right side, subsequent encounter for fracture with routine healing: Secondary | ICD-10-CM | POA: Diagnosis not present

## 2014-12-03 DIAGNOSIS — F0781 Postconcussional syndrome: Secondary | ICD-10-CM | POA: Diagnosis not present

## 2014-12-03 NOTE — Telephone Encounter (Signed)
[facility] called to find out if PT had been ok'd.  I let them know Dr.Gutierrez said it's ok to do.

## 2014-12-04 DIAGNOSIS — S2241XD Multiple fractures of ribs, right side, subsequent encounter for fracture with routine healing: Secondary | ICD-10-CM | POA: Diagnosis not present

## 2014-12-04 DIAGNOSIS — I5030 Unspecified diastolic (congestive) heart failure: Secondary | ICD-10-CM | POA: Diagnosis not present

## 2014-12-04 DIAGNOSIS — F0781 Postconcussional syndrome: Secondary | ICD-10-CM | POA: Diagnosis not present

## 2014-12-04 DIAGNOSIS — M810 Age-related osteoporosis without current pathological fracture: Secondary | ICD-10-CM | POA: Diagnosis not present

## 2014-12-04 DIAGNOSIS — F339 Major depressive disorder, recurrent, unspecified: Secondary | ICD-10-CM | POA: Diagnosis not present

## 2014-12-04 DIAGNOSIS — J449 Chronic obstructive pulmonary disease, unspecified: Secondary | ICD-10-CM | POA: Diagnosis not present

## 2014-12-06 ENCOUNTER — Ambulatory Visit (INDEPENDENT_AMBULATORY_CARE_PROVIDER_SITE_OTHER): Payer: Medicare Other | Admitting: Family Medicine

## 2014-12-06 VITALS — BP 130/70 | HR 88 | Temp 98.0°F | Wt 122.8 lb

## 2014-12-06 DIAGNOSIS — F339 Major depressive disorder, recurrent, unspecified: Secondary | ICD-10-CM | POA: Diagnosis not present

## 2014-12-06 DIAGNOSIS — M810 Age-related osteoporosis without current pathological fracture: Secondary | ICD-10-CM

## 2014-12-06 DIAGNOSIS — I5030 Unspecified diastolic (congestive) heart failure: Secondary | ICD-10-CM | POA: Diagnosis not present

## 2014-12-06 DIAGNOSIS — F419 Anxiety disorder, unspecified: Secondary | ICD-10-CM

## 2014-12-06 DIAGNOSIS — S2239XD Fracture of one rib, unspecified side, subsequent encounter for fracture with routine healing: Secondary | ICD-10-CM | POA: Diagnosis not present

## 2014-12-06 DIAGNOSIS — S2241XD Multiple fractures of ribs, right side, subsequent encounter for fracture with routine healing: Secondary | ICD-10-CM | POA: Diagnosis not present

## 2014-12-06 DIAGNOSIS — G4700 Insomnia, unspecified: Secondary | ICD-10-CM

## 2014-12-06 DIAGNOSIS — N184 Chronic kidney disease, stage 4 (severe): Secondary | ICD-10-CM

## 2014-12-06 DIAGNOSIS — I10 Essential (primary) hypertension: Secondary | ICD-10-CM | POA: Diagnosis not present

## 2014-12-06 DIAGNOSIS — F0781 Postconcussional syndrome: Secondary | ICD-10-CM | POA: Diagnosis not present

## 2014-12-06 DIAGNOSIS — J449 Chronic obstructive pulmonary disease, unspecified: Secondary | ICD-10-CM

## 2014-12-06 NOTE — Assessment & Plan Note (Signed)
Chronic, stable. Continue current regimen (lasix 20mg  bid and nebivolol 2.5mg  daily)

## 2014-12-06 NOTE — Progress Notes (Signed)
Pre visit review using our clinic review tool, if applicable. No additional management support is needed unless otherwise documented below in the visit note. 

## 2014-12-06 NOTE — Assessment & Plan Note (Signed)
Off controller meds - never had significant dyspnea sxs. Continue to monitor.

## 2014-12-06 NOTE — Assessment & Plan Note (Signed)
Now off ambien - also stopped during recent hospitalization - when pt developed acute delirium.
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
## 2014-12-06 NOTE — Assessment & Plan Note (Signed)
Continue sertraline 25mg  daily. Off xanax.

## 2014-12-06 NOTE — Patient Instructions (Addendum)
For pain - start with tylenol 500mg . If persistent pain, may take tramadol.  You are doing well today. Return as needed or in 3-4 months for medicare wellness visit

## 2014-12-06 NOTE — Assessment & Plan Note (Addendum)
Discussed calcium and vit D use. Recommended calcium intake through diet. Discussed regular weight bearing exercises.

## 2014-12-06 NOTE — Assessment & Plan Note (Signed)
Check Cr at next labwork.

## 2014-12-06 NOTE — Assessment & Plan Note (Signed)
Now off xanax - tapered off while in hospital. Agree with this. Prior attempts to stop caused withdrawal sxs.

## 2014-12-06 NOTE — Assessment & Plan Note (Addendum)
Healing well. Reviewed pain regimen.

## 2014-12-09 DIAGNOSIS — F339 Major depressive disorder, recurrent, unspecified: Secondary | ICD-10-CM | POA: Diagnosis not present

## 2014-12-09 DIAGNOSIS — I5030 Unspecified diastolic (congestive) heart failure: Secondary | ICD-10-CM | POA: Diagnosis not present

## 2014-12-09 DIAGNOSIS — M810 Age-related osteoporosis without current pathological fracture: Secondary | ICD-10-CM | POA: Diagnosis not present

## 2014-12-09 DIAGNOSIS — J449 Chronic obstructive pulmonary disease, unspecified: Secondary | ICD-10-CM | POA: Diagnosis not present

## 2014-12-09 DIAGNOSIS — F0781 Postconcussional syndrome: Secondary | ICD-10-CM | POA: Diagnosis not present

## 2014-12-09 DIAGNOSIS — S2241XD Multiple fractures of ribs, right side, subsequent encounter for fracture with routine healing: Secondary | ICD-10-CM | POA: Diagnosis not present

## 2014-12-10 ENCOUNTER — Encounter: Payer: Self-pay | Admitting: Cardiology

## 2014-12-10 ENCOUNTER — Ambulatory Visit (INDEPENDENT_AMBULATORY_CARE_PROVIDER_SITE_OTHER): Payer: Medicare Other | Admitting: Cardiology

## 2014-12-10 VITALS — BP 120/68 | HR 70 | Ht 62.0 in | Wt 123.3 lb

## 2014-12-10 DIAGNOSIS — E785 Hyperlipidemia, unspecified: Secondary | ICD-10-CM | POA: Diagnosis not present

## 2014-12-10 DIAGNOSIS — I10 Essential (primary) hypertension: Secondary | ICD-10-CM

## 2014-12-10 DIAGNOSIS — S2241XD Multiple fractures of ribs, right side, subsequent encounter for fracture with routine healing: Secondary | ICD-10-CM | POA: Diagnosis not present

## 2014-12-10 DIAGNOSIS — I5030 Unspecified diastolic (congestive) heart failure: Secondary | ICD-10-CM | POA: Diagnosis not present

## 2014-12-10 DIAGNOSIS — F339 Major depressive disorder, recurrent, unspecified: Secondary | ICD-10-CM | POA: Diagnosis not present

## 2014-12-10 DIAGNOSIS — F0781 Postconcussional syndrome: Secondary | ICD-10-CM | POA: Diagnosis not present

## 2014-12-10 DIAGNOSIS — M810 Age-related osteoporosis without current pathological fracture: Secondary | ICD-10-CM | POA: Diagnosis not present

## 2014-12-10 DIAGNOSIS — J449 Chronic obstructive pulmonary disease, unspecified: Secondary | ICD-10-CM | POA: Diagnosis not present

## 2014-12-10 NOTE — Patient Instructions (Signed)
Continue your current therapy  I will see you in 6 months.   
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
## 2014-12-11 ENCOUNTER — Telehealth: Payer: Self-pay

## 2014-12-11 DIAGNOSIS — I5030 Unspecified diastolic (congestive) heart failure: Secondary | ICD-10-CM | POA: Diagnosis not present

## 2014-12-11 DIAGNOSIS — F339 Major depressive disorder, recurrent, unspecified: Secondary | ICD-10-CM | POA: Diagnosis not present

## 2014-12-11 DIAGNOSIS — F0781 Postconcussional syndrome: Secondary | ICD-10-CM | POA: Diagnosis not present

## 2014-12-11 DIAGNOSIS — S2241XD Multiple fractures of ribs, right side, subsequent encounter for fracture with routine healing: Secondary | ICD-10-CM | POA: Diagnosis not present

## 2014-12-11 DIAGNOSIS — T8543XS Leakage of breast prosthesis and implant, sequela: Secondary | ICD-10-CM

## 2014-12-11 DIAGNOSIS — M810 Age-related osteoporosis without current pathological fracture: Secondary | ICD-10-CM | POA: Diagnosis not present

## 2014-12-11 DIAGNOSIS — J449 Chronic obstructive pulmonary disease, unspecified: Secondary | ICD-10-CM | POA: Diagnosis not present
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
## 2014-12-11 NOTE — Telephone Encounter (Signed)
Pt was seen 12/06/14.

## 2014-12-13 DIAGNOSIS — I5030 Unspecified diastolic (congestive) heart failure: Secondary | ICD-10-CM | POA: Diagnosis not present

## 2014-12-13 DIAGNOSIS — F339 Major depressive disorder, recurrent, unspecified: Secondary | ICD-10-CM | POA: Diagnosis not present

## 2014-12-13 DIAGNOSIS — S2241XD Multiple fractures of ribs, right side, subsequent encounter for fracture with routine healing: Secondary | ICD-10-CM | POA: Diagnosis not present

## 2014-12-13 DIAGNOSIS — M810 Age-related osteoporosis without current pathological fracture: Secondary | ICD-10-CM | POA: Diagnosis not present

## 2014-12-13 DIAGNOSIS — F0781 Postconcussional syndrome: Secondary | ICD-10-CM | POA: Diagnosis not present

## 2014-12-13 DIAGNOSIS — J449 Chronic obstructive pulmonary disease, unspecified: Secondary | ICD-10-CM | POA: Diagnosis not present

## 2014-12-14 NOTE — Telephone Encounter (Signed)
Referral to plastic surgeon placed. May send this phone note, recent mammogram, and latest office visit as well as office note from 04/2014.

## 2014-12-16 NOTE — Telephone Encounter (Signed)
Appt made with [facility] Surgeon with Dr Iran Planas, patient aware of appt.

## 2014-12-17 ENCOUNTER — Ambulatory Visit (INDEPENDENT_AMBULATORY_CARE_PROVIDER_SITE_OTHER): Payer: Medicare Other | Admitting: Family Medicine

## 2014-12-17 ENCOUNTER — Encounter: Payer: Self-pay | Admitting: Family Medicine

## 2014-12-17 VITALS — BP 126/68 | HR 64 | Temp 98.3°F | Wt 121.2 lb

## 2014-12-17 DIAGNOSIS — S2241XD Multiple fractures of ribs, right side, subsequent encounter for fracture with routine healing: Secondary | ICD-10-CM | POA: Diagnosis not present

## 2014-12-17 DIAGNOSIS — M810 Age-related osteoporosis without current pathological fracture: Secondary | ICD-10-CM | POA: Diagnosis not present

## 2014-12-17 DIAGNOSIS — I5030 Unspecified diastolic (congestive) heart failure: Secondary | ICD-10-CM | POA: Diagnosis not present

## 2014-12-17 DIAGNOSIS — K629 Disease of anus and rectum, unspecified: Secondary | ICD-10-CM | POA: Diagnosis not present

## 2014-12-17 DIAGNOSIS — F0781 Postconcussional syndrome: Secondary | ICD-10-CM | POA: Diagnosis not present

## 2014-12-17 DIAGNOSIS — R195 Other fecal abnormalities: Secondary | ICD-10-CM

## 2014-12-17 DIAGNOSIS — N2581 Secondary hyperparathyroidism of renal origin: Secondary | ICD-10-CM | POA: Diagnosis not present

## 2014-12-17 DIAGNOSIS — F339 Major depressive disorder, recurrent, unspecified: Secondary | ICD-10-CM | POA: Diagnosis not present

## 2014-12-17 DIAGNOSIS — I129 Hypertensive chronic kidney disease with stage 1 through stage 4 chronic kidney disease, or unspecified chronic kidney disease: Secondary | ICD-10-CM | POA: Diagnosis not present

## 2014-12-17 DIAGNOSIS — J449 Chronic obstructive pulmonary disease, unspecified: Secondary | ICD-10-CM | POA: Diagnosis not present

## 2014-12-17 DIAGNOSIS — N184 Chronic kidney disease, stage 4 (severe): Secondary | ICD-10-CM | POA: Diagnosis not present

## 2014-12-17 DIAGNOSIS — R809 Proteinuria, unspecified: Secondary | ICD-10-CM | POA: Diagnosis not present

## 2014-12-17 NOTE — Patient Instructions (Signed)
If not better in a few days, then update [place].  You may improve on your own.  Go to the lab on the way out.  We'll contact you with your lab report.
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
## 2014-12-18 DIAGNOSIS — K629 Disease of anus and rectum, unspecified: Secondary | ICD-10-CM | POA: Insufficient documentation

## 2014-12-18 DIAGNOSIS — M810 Age-related osteoporosis without current pathological fracture: Secondary | ICD-10-CM | POA: Diagnosis not present

## 2014-12-18 DIAGNOSIS — J449 Chronic obstructive pulmonary disease, unspecified: Secondary | ICD-10-CM | POA: Diagnosis not present

## 2014-12-18 DIAGNOSIS — F0781 Postconcussional syndrome: Secondary | ICD-10-CM | POA: Diagnosis not present

## 2014-12-18 DIAGNOSIS — S2241XD Multiple fractures of ribs, right side, subsequent encounter for fracture with routine healing: Secondary | ICD-10-CM | POA: Diagnosis not present

## 2014-12-18 DIAGNOSIS — F339 Major depressive disorder, recurrent, unspecified: Secondary | ICD-10-CM | POA: Diagnosis not present

## 2014-12-18 DIAGNOSIS — I5030 Unspecified diastolic (congestive) heart failure: Secondary | ICD-10-CM | POA: Diagnosis not present

## 2014-12-18 NOTE — Assessment & Plan Note (Signed)
Mucous in stool appears to be unrelated.  Will check c diff. Likely with strain/tear of sphincter muscle, but not floridly lax.  No sign of neurologic injury given normal gait, etc.  Would give more time for spontaneous improvement, if not improved soon we can refer to GI.  She agrees.   Routed to PCP as FYI.

## 2014-12-20 DIAGNOSIS — M810 Age-related osteoporosis without current pathological fracture: Secondary | ICD-10-CM | POA: Diagnosis not present

## 2014-12-20 DIAGNOSIS — F339 Major depressive disorder, recurrent, unspecified: Secondary | ICD-10-CM | POA: Diagnosis not present

## 2014-12-20 DIAGNOSIS — I5030 Unspecified diastolic (congestive) heart failure: Secondary | ICD-10-CM | POA: Diagnosis not present

## 2014-12-20 DIAGNOSIS — F0781 Postconcussional syndrome: Secondary | ICD-10-CM | POA: Diagnosis not present

## 2014-12-20 DIAGNOSIS — S2241XD Multiple fractures of ribs, right side, subsequent encounter for fracture with routine healing: Secondary | ICD-10-CM | POA: Diagnosis not present

## 2014-12-20 DIAGNOSIS — J449 Chronic obstructive pulmonary disease, unspecified: Secondary | ICD-10-CM | POA: Diagnosis not present

## 2014-12-20 DIAGNOSIS — R195 Other fecal abnormalities: Secondary | ICD-10-CM | POA: Diagnosis not present

## 2014-12-20 NOTE — Addendum Note (Signed)
Addended by: Ellamae Sia on: 12/20/2014 10:25 AM   Modules accepted: Orders

## 2014-12-21 LAB — C. DIFFICILE GDH AND TOXIN A/B
C. DIFF TOXIN A/B: NOT DETECTED
C. DIFFICILE GDH: NOT DETECTED

## 2014-12-23 DIAGNOSIS — J449 Chronic obstructive pulmonary disease, unspecified: Secondary | ICD-10-CM | POA: Diagnosis not present

## 2014-12-23 DIAGNOSIS — I5030 Unspecified diastolic (congestive) heart failure: Secondary | ICD-10-CM | POA: Diagnosis not present

## 2014-12-23 DIAGNOSIS — F339 Major depressive disorder, recurrent, unspecified: Secondary | ICD-10-CM | POA: Diagnosis not present

## 2014-12-23 DIAGNOSIS — F0781 Postconcussional syndrome: Secondary | ICD-10-CM | POA: Diagnosis not present

## 2014-12-23 DIAGNOSIS — M810 Age-related osteoporosis without current pathological fracture: Secondary | ICD-10-CM | POA: Diagnosis not present

## 2014-12-23 DIAGNOSIS — S2241XD Multiple fractures of ribs, right side, subsequent encounter for fracture with routine healing: Secondary | ICD-10-CM | POA: Diagnosis not present

## 2014-12-24 DIAGNOSIS — I5030 Unspecified diastolic (congestive) heart failure: Secondary | ICD-10-CM | POA: Diagnosis not present

## 2014-12-24 DIAGNOSIS — S2241XD Multiple fractures of ribs, right side, subsequent encounter for fracture with routine healing: Secondary | ICD-10-CM | POA: Diagnosis not present

## 2014-12-24 DIAGNOSIS — F339 Major depressive disorder, recurrent, unspecified: Secondary | ICD-10-CM | POA: Diagnosis not present

## 2014-12-24 DIAGNOSIS — J449 Chronic obstructive pulmonary disease, unspecified: Secondary | ICD-10-CM | POA: Diagnosis not present

## 2014-12-24 DIAGNOSIS — M810 Age-related osteoporosis without current pathological fracture: Secondary | ICD-10-CM | POA: Diagnosis not present

## 2014-12-24 DIAGNOSIS — F0781 Postconcussional syndrome: Secondary | ICD-10-CM | POA: Diagnosis not present

## 2014-12-25 DIAGNOSIS — I5030 Unspecified diastolic (congestive) heart failure: Secondary | ICD-10-CM | POA: Diagnosis not present

## 2014-12-25 DIAGNOSIS — M810 Age-related osteoporosis without current pathological fracture: Secondary | ICD-10-CM | POA: Diagnosis not present

## 2014-12-25 DIAGNOSIS — F339 Major depressive disorder, recurrent, unspecified: Secondary | ICD-10-CM | POA: Diagnosis not present

## 2014-12-25 DIAGNOSIS — S2241XD Multiple fractures of ribs, right side, subsequent encounter for fracture with routine healing: Secondary | ICD-10-CM | POA: Diagnosis not present

## 2014-12-25 DIAGNOSIS — F0781 Postconcussional syndrome: Secondary | ICD-10-CM | POA: Diagnosis not present

## 2014-12-25 DIAGNOSIS — J449 Chronic obstructive pulmonary disease, unspecified: Secondary | ICD-10-CM | POA: Diagnosis not present

## 2014-12-26 DIAGNOSIS — Z9882 Breast implant status: Secondary | ICD-10-CM | POA: Diagnosis not present

## 2014-12-26 DIAGNOSIS — T8549XA Other mechanical complication of breast prosthesis and implant, initial encounter: Secondary | ICD-10-CM | POA: Diagnosis not present

## 2014-12-26 DIAGNOSIS — L821 Other seborrheic keratosis: Secondary | ICD-10-CM | POA: Diagnosis not present

## 2014-12-26 DIAGNOSIS — D1801 Hemangioma of skin and subcutaneous tissue: Secondary | ICD-10-CM | POA: Diagnosis not present

## 2014-12-26 DIAGNOSIS — T8544XA Capsular contracture of breast implant, initial encounter: Secondary | ICD-10-CM | POA: Diagnosis not present

## 2014-12-26 DIAGNOSIS — Z85828 Personal history of other malignant neoplasm of skin: Secondary | ICD-10-CM | POA: Diagnosis not present

## 2014-12-29 ENCOUNTER — Other Ambulatory Visit: Payer: Self-pay | Admitting: Family Medicine

## 2014-12-30 NOTE — Telephone Encounter (Signed)
Received refill request electronically Last refill 05/21/14 #360/1 Last office visit 12/17/14/acute Is it okay to refill?

## 2014-12-31 DIAGNOSIS — J449 Chronic obstructive pulmonary disease, unspecified: Secondary | ICD-10-CM | POA: Diagnosis not present

## 2014-12-31 DIAGNOSIS — S2241XD Multiple fractures of ribs, right side, subsequent encounter for fracture with routine healing: Secondary | ICD-10-CM | POA: Diagnosis not present

## 2014-12-31 DIAGNOSIS — F0781 Postconcussional syndrome: Secondary | ICD-10-CM | POA: Diagnosis not present

## 2014-12-31 DIAGNOSIS — I5030 Unspecified diastolic (congestive) heart failure: Secondary | ICD-10-CM | POA: Diagnosis not present

## 2014-12-31 DIAGNOSIS — M810 Age-related osteoporosis without current pathological fracture: Secondary | ICD-10-CM | POA: Diagnosis not present

## 2014-12-31 DIAGNOSIS — F339 Major depressive disorder, recurrent, unspecified: Secondary | ICD-10-CM | POA: Diagnosis not present

## 2014-12-31 NOTE — Telephone Encounter (Signed)
Pt left v/m requesting status of gabapentin; pt is out of med.

## 2015-01-02 MED ORDER — GABAPENTIN 100 MG PO CAPS: ORAL_CAPSULE | ORAL | Status: DC

## 2015-01-02 NOTE — Telephone Encounter (Signed)
Refilled

## 2015-01-03 DIAGNOSIS — I5030 Unspecified diastolic (congestive) heart failure: Secondary | ICD-10-CM | POA: Diagnosis not present

## 2015-01-03 DIAGNOSIS — M810 Age-related osteoporosis without current pathological fracture: Secondary | ICD-10-CM | POA: Diagnosis not present

## 2015-01-03 DIAGNOSIS — F339 Major depressive disorder, recurrent, unspecified: Secondary | ICD-10-CM | POA: Diagnosis not present

## 2015-01-03 DIAGNOSIS — J449 Chronic obstructive pulmonary disease, unspecified: Secondary | ICD-10-CM | POA: Diagnosis not present

## 2015-01-03 DIAGNOSIS — S2241XD Multiple fractures of ribs, right side, subsequent encounter for fracture with routine healing: Secondary | ICD-10-CM | POA: Diagnosis not present

## 2015-01-03 DIAGNOSIS — F0781 Postconcussional syndrome: Secondary | ICD-10-CM | POA: Diagnosis not present

## 2015-01-08 DIAGNOSIS — M810 Age-related osteoporosis without current pathological fracture: Secondary | ICD-10-CM | POA: Diagnosis not present

## 2015-01-08 DIAGNOSIS — S2241XD Multiple fractures of ribs, right side, subsequent encounter for fracture with routine healing: Secondary | ICD-10-CM | POA: Diagnosis not present

## 2015-01-08 DIAGNOSIS — F339 Major depressive disorder, recurrent, unspecified: Secondary | ICD-10-CM | POA: Diagnosis not present

## 2015-01-08 DIAGNOSIS — I5030 Unspecified diastolic (congestive) heart failure: Secondary | ICD-10-CM | POA: Diagnosis not present

## 2015-01-08 DIAGNOSIS — F0781 Postconcussional syndrome: Secondary | ICD-10-CM | POA: Diagnosis not present

## 2015-01-08 DIAGNOSIS — J449 Chronic obstructive pulmonary disease, unspecified: Secondary | ICD-10-CM | POA: Diagnosis not present

## 2015-02-07 ENCOUNTER — Other Ambulatory Visit: Payer: Self-pay

## 2015-02-07 MED ORDER — TRAMADOL HCL 50 MG PO TABS: 50.0000 mg | ORAL_TABLET | Freq: Three times a day (TID) | ORAL | Status: DC | PRN

## 2015-02-07 NOTE — Telephone Encounter (Signed)
phoned in.

## 2015-02-07 NOTE — Telephone Encounter (Signed)
Pt left v/m requesting refill tramadol; last refilled by doctor at rehab center. Pt requesting Dr Darnell Level refill tramadol.Please advise.

## 2015-02-12 ENCOUNTER — Telehealth: Payer: Self-pay | Admitting: Family Medicine

## 2015-02-12 NOTE — Telephone Encounter (Signed)
I would try OTC imodium BID prn for diarrhea.  That is likely the best option.  Thanks.

## 2015-02-12 NOTE — Telephone Encounter (Signed)
Agree. update if persistent diarrhea

## 2015-02-12 NOTE — Telephone Encounter (Addendum)
Pt called office;pt has had 3 small diarrhea stools today; last diarrhea at 11:30 AM. Pt can eat and drink without problem; pt is urinating normally.pt has abd pain on and off at waist line; pt said she cannot put a pain level on it and cannot say that the pain is sharp or dull; pt said difficult to describe but when has pain takes Tylenol and pain goes away.pt has sinus like h/a but thinks due to weather changes. Pt feels tired and worn out. No N&V or fever. Pt does not want to expose herself to coming out to any office; pt cannot drive at this time also. Pt request cb. [facility]. Pt said this is the second bout of stomach flu since Christmas. Dr Darnell Level is out of office this afternoon; will send note to Dr Darnell Level and Dr Damita Dunnings for verification OK to wait on Dr Synthia Innocent answer.

## 2015-02-12 NOTE — Telephone Encounter (Signed)
Tried to contact pt at all contact # with no success; spoke with Hassell Done pts son (DPR signed) he will go by and ck on pt and have pt call [facility].

## 2015-02-12 NOTE — Telephone Encounter (Signed)
Patient Name: Nicole English DOB: 03/18/1932 Initial Comment [name] states she is having another round of stomach flu. She says she is dehydrated. She is urinating. Skin is not elastic. She has had diarrhea for 3 days or longer. Nurse Assessment Guidelines Guideline Title Affirmed Question Affirmed Notes Final Disposition User FINAL ATTEMPT MADE - no message left Ronnald Ramp, Therapist, sports, [employer]

## 2015-02-12 NOTE — Telephone Encounter (Signed)
Patient notified as instructed by telephone and verbalized understanding. 

## 2015-02-27 ENCOUNTER — Telehealth: Payer: Self-pay
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
## 2015-02-28 ENCOUNTER — Other Ambulatory Visit: Payer: Medicare Other

## 2015-02-28 ENCOUNTER — Other Ambulatory Visit: Payer: Self-pay | Admitting: Family Medicine

## 2015-02-28 DIAGNOSIS — Z789 Other specified health status: Secondary | ICD-10-CM

## 2015-02-28 DIAGNOSIS — E559 Vitamin D deficiency, unspecified: Secondary | ICD-10-CM

## 2015-02-28 DIAGNOSIS — D631 Anemia in chronic kidney disease: Secondary | ICD-10-CM

## 2015-02-28 DIAGNOSIS — Z7289 Other problems related to lifestyle: Secondary | ICD-10-CM

## 2015-02-28 DIAGNOSIS — N189 Chronic kidney disease, unspecified: Secondary | ICD-10-CM

## 2015-02-28 DIAGNOSIS — N184 Chronic kidney disease, stage 4 (severe): Secondary | ICD-10-CM

## 2015-02-28 NOTE — Telephone Encounter (Signed)
I'm sorry to hear about her friend. Would offer anxiety medicine called hydroxyzine to take as needed - try mainly at night time as it can make her sleepy.  Would also offer to increase sertraline to 50mg  daily Let me know if she would like to try this. Will reassess at physical next week. She's coming in for labs today.

## 2015-03-03 ENCOUNTER — Other Ambulatory Visit (INDEPENDENT_AMBULATORY_CARE_PROVIDER_SITE_OTHER): Payer: Medicare Other

## 2015-03-03 DIAGNOSIS — Z789 Other specified health status: Secondary | ICD-10-CM

## 2015-03-03 DIAGNOSIS — N184 Chronic kidney disease, stage 4 (severe): Secondary | ICD-10-CM

## 2015-03-03 DIAGNOSIS — E785 Hyperlipidemia, unspecified: Secondary | ICD-10-CM | POA: Diagnosis not present

## 2015-03-03 DIAGNOSIS — D631 Anemia in chronic kidney disease: Secondary | ICD-10-CM | POA: Diagnosis not present

## 2015-03-03 DIAGNOSIS — E559 Vitamin D deficiency, unspecified: Secondary | ICD-10-CM | POA: Diagnosis not present

## 2015-03-03 DIAGNOSIS — Z7289 Other problems related to lifestyle: Secondary | ICD-10-CM

## 2015-03-03 DIAGNOSIS — N189 Chronic kidney disease, unspecified: Secondary | ICD-10-CM

## 2015-03-03 LAB — LIPID PANEL
CHOL/HDL RATIO: 3
Cholesterol: 195 mg/dL (ref 0–200)
HDL: 55.7 mg/dL (ref >39.00)
LDL CALC: 120 mg/dL — AB (ref 0–99)
NONHDL: 139.23
Triglycerides: 98 mg/dL (ref 0.0–149.0)
VLDL: 19.6 mg/dL (ref 0.0–40.0)

## 2015-03-03 LAB — FERRITIN: FERRITIN: 31.1 ng/mL (ref 10.0–291.0)

## 2015-03-03 LAB — CBC WITH DIFFERENTIAL/PLATELET
BASOS PCT: 1.1 % (ref 0.0–3.0)
Basophils Absolute: 0.1 10*3/uL (ref 0.0–0.1)
EOS PCT: 6.4 % — AB (ref 0.0–5.0)
Eosinophils Absolute: 0.4 10*3/uL (ref 0.0–0.7)
HEMATOCRIT: 37.8 % (ref 36.0–46.0)
HEMOGLOBIN: 12.2 g/dL (ref 12.0–15.0)
LYMPHS PCT: 35.1 % (ref 12.0–46.0)
Lymphs Abs: 2.2 10*3/uL (ref 0.7–4.0)
MCHC: 32.3 g/dL (ref 30.0–36.0)
MCV: 94.6 fl (ref 78.0–100.0)
Monocytes Absolute: 0.6 10*3/uL (ref 0.1–1.0)
Monocytes Relative: 9.1 % (ref 3.0–12.0)
Neutro Abs: 3.1 10*3/uL (ref 1.4–7.7)
Neutrophils Relative %: 48.3 % (ref 43.0–77.0)
Platelets: 385 10*3/uL (ref 150.0–400.0)
RBC: 3.99 Mil/uL (ref 3.87–5.11)
RDW: 14.7 % (ref 11.5–15.5)
WBC: 6.4 10*3/uL (ref 4.0–10.5)

## 2015-03-03 LAB — IBC PANEL
Iron: 95 ug/dL (ref 42–145)
SATURATION RATIOS: 25 % (ref 20.0–50.0)
TRANSFERRIN: 271 mg/dL (ref 212.0–360.0)

## 2015-03-03 LAB — COMPREHENSIVE METABOLIC PANEL
ALT: 10 U/L (ref 0–35)
AST: 15 U/L (ref 0–37)
Albumin: 4.1 g/dL (ref 3.5–5.2)
Alkaline Phosphatase: 74 U/L (ref 39–117)
BUN: 39 mg/dL — AB (ref 6–23)
CHLORIDE: 106 meq/L (ref 96–112)
CO2: 27 meq/L (ref 19–32)
Calcium: 9.8 mg/dL (ref 8.4–10.5)
Creatinine, Ser: 2.13 mg/dL — ABNORMAL HIGH (ref 0.40–1.20)
GFR: 23.64 mL/min — ABNORMAL LOW (ref >60.00)
GLUCOSE: 106 mg/dL — AB (ref 70–99)
POTASSIUM: 4.9 meq/L (ref 3.5–5.1)
SODIUM: 140 meq/L (ref 135–145)
Total Bilirubin: 0.3 mg/dL (ref 0.2–1.2)
Total Protein: 7 g/dL (ref 6.0–8.3)

## 2015-03-03 LAB — MICROALBUMIN / CREATININE URINE RATIO
Creatinine,U: 50.7 mg/dL
Microalb Creat Ratio: 22.1 mg/g (ref 0.0–30.0)
Microalb, Ur: 11.2 mg/dL — ABNORMAL HIGH (ref 0.0–1.9)

## 2015-03-03 LAB — FOLATE: FOLATE: 12.1 ng/mL (ref >5.9)

## 2015-03-03 LAB — VITAMIN D 25 HYDROXY (VIT D DEFICIENCY, FRACTURES): VITD: 50.13 ng/mL (ref 30.00–100.00)

## 2015-03-04 LAB — PARATHYROID HORMONE, INTACT (NO CA): PTH: 229 pg/mL — ABNORMAL HIGH (ref 14–64)

## 2015-03-05 NOTE — Telephone Encounter (Signed)
Patient declined for now and said she will discuss things at her physical.

## 2015-03-07 ENCOUNTER — Encounter: Payer: Self-pay | Admitting: Family Medicine

## 2015-03-07 ENCOUNTER — Ambulatory Visit (INDEPENDENT_AMBULATORY_CARE_PROVIDER_SITE_OTHER): Payer: Medicare Other | Admitting: Family Medicine

## 2015-03-07 VITALS — BP 158/84 | HR 58 | Temp 98.0°F | Ht 61.25 in | Wt 121.2 lb

## 2015-03-07 DIAGNOSIS — F419 Anxiety disorder, unspecified: Secondary | ICD-10-CM

## 2015-03-07 DIAGNOSIS — Z7189 Other specified counseling: Secondary | ICD-10-CM | POA: Insufficient documentation

## 2015-03-07 DIAGNOSIS — D631 Anemia in chronic kidney disease: Secondary | ICD-10-CM

## 2015-03-07 DIAGNOSIS — Z789 Other specified health status: Secondary | ICD-10-CM

## 2015-03-07 DIAGNOSIS — Z0000 Encounter for general adult medical examination without abnormal findings: Secondary | ICD-10-CM

## 2015-03-07 DIAGNOSIS — E785 Hyperlipidemia, unspecified: Secondary | ICD-10-CM

## 2015-03-07 DIAGNOSIS — S2239XD Fracture of one rib, unspecified side, subsequent encounter for fracture with routine healing: Secondary | ICD-10-CM

## 2015-03-07 DIAGNOSIS — I10 Essential (primary) hypertension: Secondary | ICD-10-CM

## 2015-03-07 DIAGNOSIS — F339 Major depressive disorder, recurrent, unspecified: Secondary | ICD-10-CM

## 2015-03-07 DIAGNOSIS — N189 Chronic kidney disease, unspecified: Secondary | ICD-10-CM

## 2015-03-07 DIAGNOSIS — N184 Chronic kidney disease, stage 4 (severe): Secondary | ICD-10-CM

## 2015-03-07 DIAGNOSIS — Z7289 Other problems related to lifestyle: Secondary | ICD-10-CM

## 2015-03-07 DIAGNOSIS — M810 Age-related osteoporosis without current pathological fracture: Secondary | ICD-10-CM

## 2015-03-07 LAB — VITAMIN B1: VITAMIN B1 (THIAMINE): 25 nmol/L (ref 8–30)

## 2015-03-07 NOTE — Assessment & Plan Note (Signed)
Stable on sertraline 25mg  daily. Declines change in regimen.

## 2015-03-07 NOTE — Patient Instructions (Addendum)
Drink 1 cup of calcium fortified orange juice daily.  Start calcium tablet daily.  Work on Scientist, physiological. Return as needed or in 6 months for follow up.  Health Maintenance, Female Adopting a healthy lifestyle and getting preventive care can go a long way to promote health and wellness. Talk with your health care provider about what schedule of regular examinations is right for you. This is a good chance for you to check in with your provider about disease prevention and staying healthy. In between checkups, there are plenty of things you can do on your own. Experts have done a lot of research about which lifestyle changes and preventive measures are most likely to keep you healthy. Ask your health care provider for more information. WEIGHT AND DIET  Eat a healthy diet  Be sure to include plenty of vegetables, fruits, low-fat dairy products, and lean protein.  Do not eat a lot of foods high in solid fats, added sugars, or salt.  Get regular exercise. This is one of the most important things you can do for your health.  Most adults should exercise for at least 150 minutes each week. The exercise should increase your heart rate and make you sweat (moderate-intensity exercise).  Most adults should also do strengthening exercises at least twice a week. This is in addition to the moderate-intensity exercise.  Maintain a healthy weight  Body mass index (BMI) is a measurement that can be used to identify possible weight problems. It estimates body fat based on height and weight. Your health care provider can help determine your BMI and help you achieve or maintain a healthy weight.  For females 49 years of age and older:   A BMI below 18.5 is considered underweight.  A BMI of 18.5 to 24.9 is normal.  A BMI of 25 to 29.9 is considered overweight.  A BMI of 30 and above is considered obese.  Watch levels of cholesterol and blood lipids  You should start having your blood tested for  lipids and cholesterol at 80 years of age, then have this test every 5 years.  You may need to have your cholesterol levels checked more often if:  Your lipid or cholesterol levels are high.  You are older than 80 years of age.  You are at high risk for heart disease.  CANCER SCREENING   Lung Cancer  Lung cancer screening is recommended for adults 49-39 years old who are at high risk for lung cancer because of a history of smoking.  A yearly low-dose CT scan of the lungs is recommended for people who:  Currently smoke.  Have quit within the past 15 years.  Have at least a 30-pack-year history of smoking. A pack year is smoking an average of one pack of cigarettes a day for 1 year.  Yearly screening should continue until it has been 15 years since you quit.  Yearly screening should stop if you develop a health problem that would prevent you from having lung cancer treatment.  Breast Cancer  Practice breast self-awareness. This means understanding how your breasts normally appear and feel.  It also means doing regular breast self-exams. Let your health care provider know about any changes, no matter how small.  If you are in your 20s or 30s, you should have a clinical breast exam (CBE) by a health care provider every 1-3 years as part of a regular health exam.  If you are 91 or older, have a CBE every year. Also  consider having a breast X-ray (mammogram) every year.  If you have a family history of breast cancer, talk to your health care provider about genetic screening.  If you are at high risk for breast cancer, talk to your health care provider about having an MRI and a mammogram every year.  Breast cancer gene (BRCA) assessment is recommended for women who have family members with BRCA-related cancers. BRCA-related cancers include:  Breast.  Ovarian.  Tubal.  Peritoneal cancers.  Results of the assessment will determine the need for genetic counseling and BRCA1  and BRCA2 testing. Cervical Cancer Your health care provider may recommend that you be screened regularly for cancer of the pelvic organs (ovaries, uterus, and vagina). This screening involves a pelvic examination, including checking for microscopic changes to the surface of your cervix (Pap test). You may be encouraged to have this screening done every 3 years, beginning at age 49.  For women ages 40-65, health care providers may recommend pelvic exams and Pap testing every 3 years, or they may recommend the Pap and pelvic exam, combined with testing for human papilloma virus (HPV), every 5 years. Some types of HPV increase your risk of cervical cancer. Testing for HPV may also be done on women of any age with unclear Pap test results.  Other health care providers may not recommend any screening for nonpregnant women who are considered low risk for pelvic cancer and who do not have symptoms. Ask your health care provider if a screening pelvic exam is right for you.  If you have had past treatment for cervical cancer or a condition that could lead to cancer, you need Pap tests and screening for cancer for at least 20 years after your treatment. If Pap tests have been discontinued, your risk factors (such as having a new sexual partner) need to be reassessed to determine if screening should resume. Some women have medical problems that increase the chance of getting cervical cancer. In these cases, your health care provider may recommend more frequent screening and Pap tests. Colorectal Cancer  This type of cancer can be detected and often prevented.  Routine colorectal cancer screening usually begins at 80 years of age and continues through 80 years of age.  Your health care provider may recommend screening at an earlier age if you have risk factors for colon cancer.  Your health care provider may also recommend using home test kits to check for hidden blood in the stool.  A small camera at the  end of a tube can be used to examine your colon directly (sigmoidoscopy or colonoscopy). This is done to check for the earliest forms of colorectal cancer.  Routine screening usually begins at age 46.  Direct examination of the colon should be repeated every 5-10 years through 80 years of age. However, you may need to be screened more often if early forms of precancerous polyps or small growths are found. Skin Cancer  Check your skin from head to toe regularly.  Tell your health care provider about any new moles or changes in moles, especially if there is a change in a mole's shape or color.  Also tell your health care provider if you have a mole that is larger than the size of a pencil eraser.  Always use sunscreen. Apply sunscreen liberally and repeatedly throughout the day.  Protect yourself by wearing long sleeves, pants, a wide-brimmed hat, and sunglasses whenever you are outside. HEART DISEASE, DIABETES, AND HIGH BLOOD PRESSURE   High  blood pressure causes heart disease and increases the risk of stroke. High blood pressure is more likely to develop in:  People who have blood pressure in the high end of the normal range (130-139/85-89 mm Hg).  People who are overweight or obese.  People who are African American.  If you are 12-48 years of age, have your blood pressure checked every 3-5 years. If you are 55 years of age or older, have your blood pressure checked every year. You should have your blood pressure measured twice--once when you are at a hospital or clinic, and once when you are not at a hospital or clinic. Record the average of the two measurements. To check your blood pressure when you are not at a hospital or clinic, you can use:  An automated blood pressure machine at a pharmacy.  A home blood pressure monitor.  If you are between 50 years and 36 years old, ask your health care provider if you should take aspirin to prevent strokes.  Have regular diabetes  screenings. This involves taking a blood sample to check your fasting blood sugar level.  If you are at a normal weight and have a low risk for diabetes, have this test once every three years after 80 years of age.  If you are overweight and have a high risk for diabetes, consider being tested at a younger age or more often. PREVENTING INFECTION  Hepatitis B  If you have a higher risk for hepatitis B, you should be screened for this virus. You are considered at high risk for hepatitis B if:  You were born in a country where hepatitis B is common. Ask your health care provider which countries are considered high risk.  Your parents were born in a high-risk country, and you have not been immunized against hepatitis B (hepatitis B vaccine).  You have HIV or AIDS.  You use needles to inject street drugs.  You live with someone who has hepatitis B.  You have had sex with someone who has hepatitis B.  You get hemodialysis treatment.  You take certain medicines for conditions, including cancer, organ transplantation, and autoimmune conditions. Hepatitis C  Blood testing is recommended for:  Everyone born from [birth year] through 1963.  Anyone with known risk factors for hepatitis C. Sexually transmitted infections (STIs)  You should be screened for sexually transmitted infections (STIs) including gonorrhea and chlamydia if:  You are sexually active and are younger than 80 years of age.  You are older than 80 years of age and your health care provider tells you that you are at risk for this type of infection.  Your sexual activity has changed since you were last screened and you are at an increased risk for chlamydia or gonorrhea. Ask your health care provider if you are at risk.  If you do not have HIV, but are at risk, it may be recommended that you take a prescription medicine daily to prevent HIV infection. This is called pre-exposure prophylaxis (PrEP). You are considered at risk  if:  You are sexually active and do not regularly use condoms or know the HIV status of your partner(s).  You take drugs by injection.  You are sexually active with a partner who has HIV. Talk with your health care provider about whether you are at high risk of being infected with HIV. If you choose to begin PrEP, you should first be tested for HIV. You should then be tested every 3 months for as long  as you are taking PrEP.  PREGNANCY   If you are premenopausal and you may become pregnant, ask your health care provider about preconception counseling.  If you may become pregnant, take 400 to 800 micrograms (mcg) of folic acid every day.  If you want to prevent pregnancy, talk to your health care provider about birth control (contraception). OSTEOPOROSIS AND MENOPAUSE   Osteoporosis is a disease in which the bones lose minerals and strength with aging. This can result in serious bone fractures. Your risk for osteoporosis can be identified using a bone density scan.  If you are 16 years of age or older, or if you are at risk for osteoporosis and fractures, ask your health care provider if you should be screened.  Ask your health care provider whether you should take a calcium or vitamin D supplement to lower your risk for osteoporosis.  Menopause may have certain physical symptoms and risks.  Hormone replacement therapy may reduce some of these symptoms and risks. Talk to your health care provider about whether hormone replacement therapy is right for you.  HOME CARE INSTRUCTIONS   Schedule regular health, dental, and eye exams.  Stay current with your immunizations.   Do not use any tobacco products including cigarettes, chewing tobacco, or electronic cigarettes.  If you are pregnant, do not drink alcohol.  If you are breastfeeding, limit how much and how often you drink alcohol.  Limit alcohol intake to no more than 1 drink per day for nonpregnant women. One drink equals 12  ounces of beer, 5 ounces of wine, or 1 ounces of hard liquor.  Do not use street drugs.  Do not share needles.  Ask your health care provider for help if you need support or information about quitting drugs.  Tell your health care provider if you often feel depressed.  Tell your health care provider if you have ever been abused or do not feel safe at home.   This information is not intended to replace advice given to you by your health care provider. Make sure you discuss any questions you have with your health care provider.   Document Released: 07/20/2010 Document Revised: 01/25/2014 Document Reviewed: 12/06/2012 Elsevier Interactive Patient Education [employer].

## 2015-03-07 NOTE — Assessment & Plan Note (Signed)
Pain has resolved.

## 2015-03-07 NOTE — Assessment & Plan Note (Addendum)
Chronic, stable. Continue current regimen. Pt self decreased lasix to 20mg  daily.

## 2015-03-07 NOTE — Assessment & Plan Note (Signed)

## 2015-03-07 NOTE — Assessment & Plan Note (Signed)
Has f/u with renal next month. Copy of labs provided today. Appreciate Dr Assunta Gambles care of patient.

## 2015-03-07 NOTE — Assessment & Plan Note (Addendum)
See above. Continue sertraline 25mg  daily. Declines change in regimen at this time. Off xanax. Discussed best friend's illness and support provided.

## 2015-03-07 NOTE — Assessment & Plan Note (Signed)
Chronic, mild. Stable off meds

## 2015-03-07 NOTE — Assessment & Plan Note (Signed)
Discussed dietary calcium recs - not achieving this so will add 1 tablet 500mg  oyster calcium she has at home. Vit D level normal.

## 2015-03-07 NOTE — Assessment & Plan Note (Signed)
Advanced directives: needs to set up. Packet provided 2015. Has 3 sons locally. DIL working on this. Asked to bring copy when complete.

## 2015-03-07 NOTE — Progress Notes (Signed)
BP 158/84 mmHg  Pulse 58  Temp(Src) 98 F (36.7 C) (Oral)  Ht 5' 1.25" (1.556 m)  Wt 121 lb 4 oz (54.999 kg)  BMI 22.72 kg/m2  SpO2 98%   CC: medicare wellness visit  Subjective:    Patient ID: Nicole English, female    DOB: April 26, 1932, 82 y.o.   MRN: [id number]  HPI: Nicole English is a 82 y.o. female presenting on 03/07/2015 for Annual Exam   Best friend dying in [place].  Feels doing well on sertraline 25mg  daily.  Feels tension in neck, asks about restarting gabapentin.  Dry eyes have lately started bothering her - without other allergy symptoms. Eyes don't get significantly red.  Denies UTI sxs of dysuria, urgency, frequency Recent diarrhea over last few months that has now improved over last several days.  Prior numbness of head and headache has now resolved.   Passes hearing screen today Recent eye exam 2016 1 fall - tripped at home, no injury Denies depression/anhedonia/sadness  Preventative: COLONOSCOPY Date: 2002 does not want another! Mammogram - 04/2014 [facility] 1 Well woman: s/p hysterectomy 1996 for dysmenorrhea. Ovaries removed as well.  DEXA Date: 08/2013 Spine -2.2, hip -3.5. Poor calcium in diet.  Flu yearly Pneumovax 05/2007, prevnar 2015 Tdap 11/2010 Zostavax 06/2009 Advanced directives: needs to set up. Packet provided 2015. Has 3 sons locally. DIL working on this. Asked to bring copy when complete. Seat belt use discussed Sunscreen use discussed. No changing moles on skin.  Lives alone Has family nearby (3 sons locally) - cares for granddaughter [name]) occasionally Occupation: retired, was Holiday representative, stays involved with national conventions Activity: stretching regularly  Diet: good water, some vegetables, follows renal diet.   Relevant past medical, surgical, family and social history reviewed and updated as indicated. Interim medical history since our last visit reviewed. Allergies and medications reviewed and updated. Current Outpatient  Prescriptions on File Prior to Visit  Medication Sig  . acetaminophen (TYLENOL) 500 MG tablet Take 1,000 mg by mouth daily as needed (pain).   Marland Kitchen aspirin EC 81 MG tablet Take 81 mg by mouth daily.  . calcitRIOL (ROCALTROL) 0.25 MCG capsule Take 0.25 mcg by mouth 3 (three) times a week.   . cholecalciferol (VITAMIN D) 1000 UNITS tablet Take 1,000 Units by mouth daily.  . furosemide (LASIX) 20 MG tablet Take 20 mg by mouth daily.   . nebivolol (BYSTOLIC) 5 MG tablet Take 0.5 tablets (2.5 mg total) by mouth daily.  . sertraline (ZOLOFT) 25 MG tablet Take 1 tablet (25 mg total) by mouth daily.  . traMADol (ULTRAM) 50 MG tablet Take 1 tablet (50 mg total) by mouth 3 (three) times daily as needed for moderate pain.   No current facility-administered medications on file prior to visit.    Review of Systems Per HPI unless specifically indicated in ROS section     Objective:    BP 158/84 mmHg  Pulse 58  Temp(Src) 98 F (36.7 C) (Oral)  Ht 5' 1.25" (1.556 m)  Wt 121 lb 4 oz (54.999 kg)  BMI 22.72 kg/m2  SpO2 98%  Wt Readings from Last 3 Encounters:  03/07/15 121 lb 4 oz (54.999 kg)  12/17/14 121 lb 4 oz (54.999 kg)  12/10/14 123 lb 4.8 oz (55.929 kg)    Physical Exam  Constitutional: She is oriented to person, place, and time. She appears well-developed and well-nourished. No distress.  HENT:  Head: Normocephalic and atraumatic.  Right Ear: Hearing, tympanic membrane, external ear  and ear canal normal.  Left Ear: Hearing, tympanic membrane, external ear and ear canal normal.  Nose: Nose normal.  Mouth/Throat: Uvula is midline, oropharynx is clear and moist and mucous membranes are normal. No oropharyngeal exudate, posterior oropharyngeal edema or posterior oropharyngeal erythema.  Eyes: Conjunctivae and EOM are normal. Pupils are equal, round, and reactive to light. No scleral icterus.  Neck: Normal range of motion. Neck supple. Carotid bruit is not present. No thyromegaly present.    Cardiovascular: Normal rate, regular rhythm, normal heart sounds and intact distal pulses.   No murmur heard. Pulses:      Radial pulses are 2+ on the right side, and 2+ on the left side.  Pulmonary/Chest: Effort normal and breath sounds normal. No respiratory distress. She has no wheezes. She has no rales.  Abdominal: Soft. Bowel sounds are normal. She exhibits no distension and no mass. There is no tenderness. There is no rebound and no guarding.  Musculoskeletal: Normal range of motion. She exhibits no edema.  Lymphadenopathy:    She has no cervical adenopathy.  Neurological: She is alert and oriented to person, place, and time.  CN grossly intact, station and gait intact Recall 1/3, 2/3 with cue Calculation 5/5 serial 7s  Skin: Skin is warm and dry. No rash noted.  Psychiatric: She has a normal mood and affect. Her behavior is normal. Judgment and thought content normal.  Nursing note and vitals reviewed.  Results for orders placed or performed in visit on 03/03/15  Lipid panel  Result Value Ref Range   Cholesterol 195 0 - 200 mg/dL   Triglycerides 98.0 0.0 - 149.0 mg/dL   HDL 55.70 >39.00 mg/dL   VLDL 19.6 0.0 - 40.0 mg/dL   LDL Cholesterol 120 (H) 0 - 99 mg/dL   Total CHOL/HDL Ratio 3    NonHDL 139.23   Comprehensive metabolic panel  Result Value Ref Range   Sodium 140 135 - 145 mEq/L   Potassium 4.9 3.5 - 5.1 mEq/L   Chloride 106 96 - 112 mEq/L   CO2 27 19 - 32 mEq/L   Glucose, Bld 106 (H) 70 - 99 mg/dL   BUN 39 (H) 6 - 23 mg/dL   Creatinine, Ser 2.13 (H) 0.40 - 1.20 mg/dL   Total Bilirubin 0.3 0.2 - 1.2 mg/dL   Alkaline Phosphatase 74 39 - 117 U/L   AST 15 0 - 37 U/L   ALT 10 0 - 35 U/L   Total Protein 7.0 6.0 - 8.3 g/dL   Albumin 4.1 3.5 - 5.2 g/dL   Calcium 9.8 8.4 - 10.5 mg/dL   GFR 23.64 (L) >60.00 mL/min  Microalbumin / creatinine urine ratio  Result Value Ref Range   Microalb, Ur 11.2 (H) 0.0 - 1.9 mg/dL   Creatinine,U 50.7 mg/dL   Microalb Creat Ratio  22.1 0.0 - 30.0 mg/g  CBC with Differential/Platelet  Result Value Ref Range   WBC 6.4 4.0 - 10.5 K/uL   RBC 3.99 3.87 - 5.11 Mil/uL   Hemoglobin 12.2 12.0 - 15.0 g/dL   HCT 37.8 36.0 - 46.0 %   MCV 94.6 78.0 - 100.0 fl   MCHC 32.3 30.0 - 36.0 g/dL   RDW 14.7 11.5 - 15.5 %   Platelets 385.0 150.0 - 400.0 K/uL   Neutrophils Relative % 48.3 43.0 - 77.0 %   Lymphocytes Relative 35.1 12.0 - 46.0 %   Monocytes Relative 9.1 3.0 - 12.0 %   Eosinophils Relative 6.4 (H) 0.0 - 5.0 %  Basophils Relative 1.1 0.0 - 3.0 %   Neutro Abs 3.1 1.4 - 7.7 K/uL   Lymphs Abs 2.2 0.7 - 4.0 K/uL   Monocytes Absolute 0.6 0.1 - 1.0 K/uL   Eosinophils Absolute 0.4 0.0 - 0.7 K/uL   Basophils Absolute 0.1 0.0 - 0.1 K/uL  VITAMIN D 25 Hydroxy (Vit-D Deficiency, Fractures)  Result Value Ref Range   VITD 50.13 30.00 - 100.00 ng/mL  Parathyroid hormone, intact (no Ca)  Result Value Ref Range   PTH 229 (H) 14 - 64 pg/mL  Ferritin  Result Value Ref Range   Ferritin 31.1 10.0 - 291.0 ng/mL  IBC panel  Result Value Ref Range   Iron 95 42 - 145 ug/dL   Transferrin 271.0 212.0 - 360.0 mg/dL   Saturation Ratios 25.0 20.0 - 50.0 %  Folate  Result Value Ref Range   Folate 12.1 >5.9 ng/mL  Vitamin B1  Result Value Ref Range   Vitamin B1 (Thiamine)        Assessment & Plan:   Problem List Items Addressed This Visit    RESOLVED: Rib fractures    Pain has resolved.      Osteoporosis    Discussed dietary calcium recs - not achieving this so will add 1 tablet 500mg  oyster calcium she has at home. Vit D level normal.      Relevant Medications   Oyster Shell (OYSTER CALCIUM) 500 MG TABS tablet   Medicare annual wellness visit, subsequent - Primary    I have personally reviewed the Medicare Annual Wellness questionnaire and have noted 1. The patient's medical and social history 2. Their use of alcohol, tobacco or illicit drugs 3. Their current medications and supplements 4. The patient's functional  ability including ADL's, fall risks, home safety risks and hearing or visual impairment. Cognitive function has been assessed and addressed as indicated.  5. Diet and physical activity 6. Evidence for depression or mood disorders The patients weight, height, BMI have been recorded in the chart. I have made referrals, counseling and provided education to the patient based on review of the above and I have provided the pt with a written personalized care plan for preventive services. Provider list updated.. See scanned questionairre as needed for further documentation. Reviewed preventative protocols and updated unless pt declined.       MDD (major depressive disorder) ([place])    See above. Continue sertraline 25mg  daily. Declines change in regimen at this time. Off xanax. Discussed best friend's illness and support provided.      HTN (hypertension)    Chronic, stable. Continue current regimen. Pt self decreased lasix to 20mg  daily.      HLD (hyperlipidemia)    Chronic, mild. Stable off meds      CKD (chronic kidney disease) stage 4, GFR 15-29 ml/min (HCC)    Has f/u with renal next month. Copy of labs provided today. Appreciate Dr Assunta Gambles care of patient.      Anxiety    Stable on sertraline 25mg  daily. Declines change in regimen.      Anemia in chronic kidney disease    Actually recent labs stable.      Alcohol use (HCC)    Regularly drinks 1-2 glasses of wine daily. Awaiting thiamine levels. Lab Results  Component Value Date   [id number] 10/16/2014         Advanced care planning/counseling discussion    Advanced directives: needs to set up. Packet provided 2015. Has 3 sons locally. DIL working  on this. Asked to bring copy when complete.          Follow up plan: Return in about 6 months (around 09/04/2015), or as needed, for medicare wellness visit.

## 2015-03-07 NOTE — Assessment & Plan Note (Signed)
Regularly drinks 1-2 glasses of wine daily. Awaiting thiamine levels. Lab Results  Component Value Date   [id number] 10/16/2014

## 2015-03-07 NOTE — Assessment & Plan Note (Signed)
Actually recent labs stable.

## 2015-03-13 ENCOUNTER — Telehealth: Payer: Self-pay | Admitting: Cardiology

## 2015-03-13 NOTE — Telephone Encounter (Signed)
Pt c/o BP issue: STAT if pt c/o blurred vision, one-sided weakness or slurred speech  1. What are your last 5 BP readings? 178/74      2. Are you having any other symptoms (ex. Dizziness, headache, blurred vision, passed out)? HEADACHE  3. What is your BP issue? HIGH

## 2015-03-13 NOTE — Telephone Encounter (Signed)
Call dropped or no answer when dialed.

## 2015-03-17 ENCOUNTER — Other Ambulatory Visit: Payer: Self-pay

## 2015-03-17 DIAGNOSIS — D631 Anemia in chronic kidney disease: Secondary | ICD-10-CM | POA: Diagnosis not present

## 2015-03-17 DIAGNOSIS — R809 Proteinuria, unspecified: Secondary | ICD-10-CM | POA: Diagnosis not present

## 2015-03-17 DIAGNOSIS — N184 Chronic kidney disease, stage 4 (severe): Secondary | ICD-10-CM | POA: Diagnosis not present

## 2015-03-17 DIAGNOSIS — I129 Hypertensive chronic kidney disease with stage 1 through stage 4 chronic kidney disease, or unspecified chronic kidney disease: Secondary | ICD-10-CM | POA: Diagnosis not present

## 2015-03-17 DIAGNOSIS — Z1231 Encounter for screening mammogram for malignant neoplasm of breast: Secondary | ICD-10-CM

## 2015-03-17 DIAGNOSIS — N2581 Secondary hyperparathyroidism of renal origin: Secondary | ICD-10-CM | POA: Diagnosis not present

## 2015-03-18 ENCOUNTER — Telehealth: Payer: Self-pay

## 2015-03-18 MED ORDER — SERTRALINE HCL 50 MG PO TABS: 50.0000 mg | ORAL_TABLET | Freq: Every day | ORAL | Status: DC

## 2015-03-18 MED ORDER — FUROSEMIDE 20 MG PO TABS: 40.0000 mg | ORAL_TABLET | Freq: Every day | ORAL | Status: DC

## 2015-03-18 NOTE — Telephone Encounter (Signed)
Pt left /vm; pt was seen for annual exam on 03/07/15 and Dr Darnell Level advised pt to increase 2 meds; at that time pt did not want to increase the medications but pt has changed her mind and pt is willing to increase both meds but pt cannot remember the names of the medication. ? Lasix and sertraline. Pt request new rx for the 2 meds to go to [facility].

## 2015-03-18 NOTE — Telephone Encounter (Signed)
Sertraline 50mg  sent to pharmacy. May double up on current 25mg  dose until runs out. As far as blood pressure med - lets increase lasix to 40mg  daily (2 tablets) as her bp has been running high recently.

## 2015-03-19 NOTE — Telephone Encounter (Signed)
Patient notified and verbalized understanding. 

## 2015-03-26 ENCOUNTER — Telehealth: Payer: Self-pay

## 2015-03-26 NOTE — Telephone Encounter (Signed)
Pt left v/m requesting refill tramadol to [facility]; last refilled # 60 on 02/07/15; last seen annual exam on 03/07/15. Pt almost out of med.pt request cb when refilled.

## 2015-03-27 MED ORDER — TRAMADOL HCL 50 MG PO TABS: 50.0000 mg | ORAL_TABLET | Freq: Three times a day (TID) | ORAL | Status: DC | PRN

## 2015-03-27 NOTE — Telephone Encounter (Signed)
plz phone in. 

## 2015-03-27 NOTE — Telephone Encounter (Signed)
Pt called to check on the status of this request. Pt states she is in pain.

## 2015-03-27 NOTE — Telephone Encounter (Signed)
Rx called in as directed and pt notified.

## 2015-04-20 ENCOUNTER — Other Ambulatory Visit: Payer: Self-pay | Admitting: Family Medicine

## 2015-04-21 ENCOUNTER — Telehealth: Payer: Self-pay | Admitting: Cardiology

## 2015-04-21 NOTE — Telephone Encounter (Signed)
Pt called to ck on status of tramadol refill; advised called in 04/21/15 at 10:50 AM. Pt will ck with pharmacy.

## 2015-04-21 NOTE — Telephone Encounter (Signed)
plz phone in. 

## 2015-04-21 NOTE — Telephone Encounter (Signed)
Nicole English is calling because she bis on Bystolic and the pharmacy has told he that it will now cost $355.00 and is wanting to know what is there something else she can be.    Thanks

## 2015-04-21 NOTE — Telephone Encounter (Signed)
Rx called in as directed.   

## 2015-04-21 NOTE — Telephone Encounter (Signed)
Returned call to patient she stated bystolic price has increased.Advised I will send message to [place] for advice.

## 2015-04-21 NOTE — Telephone Encounter (Signed)
Pt calling back again.

## 2015-04-21 NOTE — Telephone Encounter (Signed)
I would try bisoprolol 5 mg daily instead  Symon Norwood Martinique MD, [facility]

## 2015-04-22 ENCOUNTER — Telehealth: Payer: Self-pay | Admitting: Cardiology

## 2015-04-22 MED ORDER — BISOPROLOL FUMARATE 5 MG PO TABS: 5.0000 mg | ORAL_TABLET | Freq: Every day | ORAL | Status: DC

## 2015-04-22 NOTE — Telephone Encounter (Signed)
See previous note 04/22/15 already taken care of.

## 2015-04-22 NOTE — Addendum Note (Signed)
Addended by: Kathyrn Lass on: 04/22/2015 09:42 AM   Modules accepted: Orders, Medications

## 2015-04-22 NOTE — Telephone Encounter (Signed)
Returned call to patient advised when she finishes bystolic Dr.Jordan prescribed bisoprolol 5 mg daily.Advised to call back if she has any problems.

## 2015-04-22 NOTE — Telephone Encounter (Signed)
Per Answering Service: Medicine she was given is over $350,she can not afford it. Pt needs something else in its place.

## 2015-04-24 ENCOUNTER — Ambulatory Visit: Payer: Medicare Other

## 2015-05-12 ENCOUNTER — Telehealth: Payer: Self-pay | Admitting: Family Medicine

## 2015-05-12 ENCOUNTER — Other Ambulatory Visit: Payer: Self-pay | Admitting: Family Medicine

## 2015-05-12 ENCOUNTER — Ambulatory Visit: Payer: Medicare Other

## 2015-05-12 NOTE — Telephone Encounter (Signed)
Rx called in as directed.   

## 2015-05-12 NOTE — Telephone Encounter (Signed)
Pt called wanting to get a referral to  Incontinence issues since [facility]
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
## 2015-05-12 NOTE — Telephone Encounter (Signed)
plz phone in. 

## 2015-05-13 NOTE — Telephone Encounter (Signed)
appt scheduled with Dr. Darnell Level on 05/02

## 2015-05-14 ENCOUNTER — Telehealth: Payer: Self-pay | Admitting: Family Medicine

## 2015-05-14 NOTE — Telephone Encounter (Signed)
Attempted to call patient. Voicemail not accepting messages. Rx cannot be picked up until 05/19/15 per Dr. Darnell Level. She shouldn't be out.

## 2015-05-14 NOTE — Telephone Encounter (Signed)
Patient called and said she's out of her Tramadol.  I let her know Maudie Mercury called it in to the pharmacy, but she sent someone to the pharmacy last night and the pharmacy said they didn't get it.  She uses [facility] on [address].

## 2015-05-15 ENCOUNTER — Telehealth: Payer: Self-pay | Admitting: Family Medicine

## 2015-05-15 MED ORDER — TRAMADOL HCL 50 MG PO TABS: 50.0000 mg | ORAL_TABLET | Freq: Three times a day (TID) | ORAL | Status: DC | PRN

## 2015-05-15 NOTE — Telephone Encounter (Signed)
Patient said she was told by her insurance they can get her a better deal if she can get the Furosemide reduced from 40 mg to 30 mg..  Patient said it's all right with her, if it's all right with Dr.Gutierrez. Patient said if it's all right, patient wants to know if she can split 1tablet that she's currently using until the next prescription.

## 2015-05-15 NOTE — Telephone Encounter (Signed)
Spoke with patient. She said she ran out on the 24th. She doesn't know why because she never runs out early, so she is wondering if she may have been shorted at the pharmacy. She said there has been some fishy things going on there anyway. She asks if she can get refill now and she is going to count pills prior to leaving pharmacy to make sure she has #60.

## 2015-05-15 NOTE — Telephone Encounter (Signed)
Will refill early. Make sure she keeps in safe place or under lock and key at home. plz phone in new med.

## 2015-05-15 NOTE — Telephone Encounter (Signed)
Spoke with patient. She can get a better rate on insurance if she can decrease lasix from 40mg  to a lower dose. Based on the 40mg  dose, it appears to the insurance carrier that she has heart problems, which makes a higher risk patient. Ok to decrease?

## 2015-05-15 NOTE — Telephone Encounter (Signed)
Patient notified and verbalized understanding. 

## 2015-05-16 ENCOUNTER — Ambulatory Visit: Payer: Medicare Other

## 2015-05-18 ENCOUNTER — Encounter: Payer: Self-pay | Admitting: Family Medicine

## 2015-05-18 NOTE — Telephone Encounter (Signed)
She is on lasix for both diastolic CHF and kidney disease. rec discuss decreased dose with cards at upcoming appt.  We can discuss in detail at Tuesday's appt.

## 2015-05-20 ENCOUNTER — Encounter: Payer: Self-pay | Admitting: Family Medicine

## 2015-05-20 ENCOUNTER — Ambulatory Visit (INDEPENDENT_AMBULATORY_CARE_PROVIDER_SITE_OTHER): Payer: Medicare Other | Admitting: Family Medicine

## 2015-05-20 ENCOUNTER — Telehealth: Payer: Self-pay | Admitting: Cardiology

## 2015-05-20 VITALS — BP 132/80 | HR 60 | Temp 98.1°F | Wt 117.2 lb

## 2015-05-20 DIAGNOSIS — R55 Syncope and collapse: Secondary | ICD-10-CM

## 2015-05-20 DIAGNOSIS — I519 Heart disease, unspecified: Secondary | ICD-10-CM

## 2015-05-20 DIAGNOSIS — I10 Essential (primary) hypertension: Secondary | ICD-10-CM

## 2015-05-20 DIAGNOSIS — N184 Chronic kidney disease, stage 4 (severe): Secondary | ICD-10-CM

## 2015-05-20 DIAGNOSIS — T8549XS Other mechanical complication of breast prosthesis and implant, sequela: Secondary | ICD-10-CM

## 2015-05-20 DIAGNOSIS — T8543XS Leakage of breast prosthesis and implant, sequela: Secondary | ICD-10-CM

## 2015-05-20 DIAGNOSIS — I5189 Other ill-defined heart diseases: Secondary | ICD-10-CM

## 2015-05-20 DIAGNOSIS — R32 Unspecified urinary incontinence: Secondary | ICD-10-CM | POA: Diagnosis not present

## 2015-05-20 DIAGNOSIS — R0789 Other chest pain: Secondary | ICD-10-CM

## 2015-05-20 DIAGNOSIS — R0781 Pleurodynia: Secondary | ICD-10-CM

## 2015-05-20 DIAGNOSIS — N393 Stress incontinence (female) (male): Secondary | ICD-10-CM

## 2015-05-20 LAB — POC URINALSYSI DIPSTICK (AUTOMATED)
BILIRUBIN UA: NEGATIVE
Blood, UA: NEGATIVE
GLUCOSE UA: NEGATIVE
Ketones, UA: NEGATIVE
LEUKOCYTES UA: NEGATIVE
NITRITE UA: NEGATIVE
Spec Grav, UA: 1.02
Urobilinogen, UA: 0.2
pH, UA: 6

## 2015-05-20 MED ORDER — GABAPENTIN 100 MG PO CAPS: 100.0000 mg | ORAL_CAPSULE | Freq: Two times a day (BID) | ORAL | Status: DC

## 2015-05-20 NOTE — Assessment & Plan Note (Addendum)
Unclear if true syncope, not consistent with seizures. Reviewed recent imaging including head CT, carotid US and echocardiogram.  ?orthostatic syncope - if ongoing despite decreased lasix dose consider return to cards to consider holter.

## 2015-05-20 NOTE — Telephone Encounter (Signed)
Recommendations communicated to patient, who verbalized understanding. She will call back if repeat of episodes or further concerns. Will keep BP log and report any concerning trends.

## 2015-05-20 NOTE — Telephone Encounter (Signed)
New message    Pt c/o Syncope: STAT if syncope occurred within 30 minutes and pt complains of lightheadedness High Priority if episode of passing out, completely, today or in last 24 hours   1. Did you pass out today? No   2. When is the last time you passed out?  05/19/2015  Last night  3. Has this occurred multiple times? Yes two weeks ago  4. Did you have any symptoms prior to passing out? No, pt was aware that she was going to fall she said her body became like a wet noodle and she slid into the floor
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
## 2015-05-20 NOTE — Assessment & Plan Note (Signed)
Mild by echo 2014.  Long-term on lasix 40mg  daily, seems euvolemic. Reasonable to try lower lasix dose - sent in 20mg  daily.  Advised of need to monitor for weight gain, leg swelling, developing dyspnea, or bp trending up, or if stops producing urine.  Check labs in 2 wks.

## 2015-05-20 NOTE — Telephone Encounter (Signed)
Will discuss at visit today.

## 2015-05-20 NOTE — Progress Notes (Signed)
Pre visit review using our clinic review tool, if applicable. No additional management support is needed unless otherwise documented below in the visit note. 

## 2015-05-20 NOTE — Patient Instructions (Addendum)
Orthostatic vital signs today. Trial lower lasix dose 20mg  daily (1 tablet daily). Watch blood pressures, watch for leg swelling or shortness of breath developing and let me know if this happens.  Urinalysis today.  Increase gabapentin to 100mg  twice daily.  Return in 2 weeks for lab visit only.  Return in 1 month for follow up visit.
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
## 2015-05-20 NOTE — Telephone Encounter (Signed)
Agree with current recommendations. I reviewed Dr. Bosie Clos note from today. Check BP closely. If she has any recurrent dizziness or syncope she will need to be seen but it isn't clear that she truly passed out.  Betina Puckett Martinique MD, [facility] [address]

## 2015-05-21 ENCOUNTER — Encounter: Payer: Self-pay | Admitting: Family Medicine

## 2015-05-21 DIAGNOSIS — R0781 Pleurodynia: Secondary | ICD-10-CM | POA: Insufficient documentation

## 2015-05-21 DIAGNOSIS — R32 Unspecified urinary incontinence: Secondary | ICD-10-CM | POA: Insufficient documentation

## 2015-05-21 MED ORDER — FUROSEMIDE 20 MG PO TABS: 20.0000 mg | ORAL_TABLET | Freq: Every day | ORAL | Status: DC

## 2015-05-21 NOTE — Assessment & Plan Note (Signed)
Pt saw plastic surgery, decided at this time against surgery given COPD history.

## 2015-05-21 NOTE — Assessment & Plan Note (Addendum)
On bisoprolol 5mg  daily and lasix 40mg  daily. bp stable on this regimen - will trial lower lasix dose - see below.
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
## 2015-05-21 NOTE — Assessment & Plan Note (Signed)
H/o macroplastique then removal 2010. Denies significant stress incontinence symptoms.
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
## 2015-05-21 NOTE — Assessment & Plan Note (Signed)
Cr has been stable. Followed by Dr Juleen China.  Reasonable to try lower lasix dose in setting of "worsening incontinence". If no improvement noted with this, will return to higher lasix dose and refer to urology to re establish.

## 2015-05-22 ENCOUNTER — Telehealth: Payer: Self-pay | Admitting: Family Medicine

## 2015-05-22 ENCOUNTER — Ambulatory Visit (INDEPENDENT_AMBULATORY_CARE_PROVIDER_SITE_OTHER): Payer: Medicare Other | Admitting: Family Medicine

## 2015-05-22 ENCOUNTER — Encounter: Payer: Self-pay | Admitting: Family Medicine

## 2015-05-22 VITALS — BP 112/70 | HR 65 | Temp 97.6°F | Wt 116.5 lb

## 2015-05-22 DIAGNOSIS — N184 Chronic kidney disease, stage 4 (severe): Secondary | ICD-10-CM | POA: Diagnosis not present

## 2015-05-22 MED ORDER — TRAMADOL HCL 50 MG PO TABS: 50.0000 mg | ORAL_TABLET | Freq: Two times a day (BID) | ORAL | Status: DC | PRN

## 2015-05-22 MED ORDER — FUROSEMIDE 20 MG PO TABS: 30.0000 mg | ORAL_TABLET | Freq: Every day | ORAL | Status: DC

## 2015-05-22 NOTE — Telephone Encounter (Deleted)
error 

## 2015-05-22 NOTE — Telephone Encounter (Signed)
Already spoke with patient and added her to schedule today.

## 2015-05-22 NOTE — Telephone Encounter (Signed)
TH called regarding pt, she had an ED disposition, and she is refusing to go. TH will be sending note over as well

## 2015-05-22 NOTE — Telephone Encounter (Signed)
Patient Name: CHARAE OKE DOB: May 26, 1932 Initial Comment [name] states BP was up this morning, having difficulty breathing. Still at 157/123 Nurse Assessment Nurse: Ronnald Ramp, RN, Miranda Date/Time Eilene Ghazi Time): 05/22/2015 1:20:29 PM Confirm and document reason for call. If symptomatic, describe symptoms. You must click the next button to save text entered. ---[name] states her BP is 157/123 and having trouble breathing and fullness in her chest. She states her MD changed her blood pressure medicine recently. Has the patient traveled out of the country within the last 30 days? ---Not Applicable Does the patient have any new or worsening symptoms? ---Yes Will a triage be completed? ---Yes Related visit to physician within the last 2 weeks? ---Yes Does the PT have any chronic conditions? (i.e. diabetes, asthma, etc.) ---Yes List chronic conditions. ---HTN Is this a behavioral health or substance abuse call? ---No Guidelines Guideline Title Affirmed Question Affirmed Notes High Blood Pressure [1] BP # 160 / 100 AND [2] cardiac or neurologic symptoms (e.g., chest pain, difficulty breathing, unsteady gait, blurred vision) Final Disposition User Go to ED Now Ronnald Ramp, RN, Miranda Comments [name] was not sure she was going to the ED due to transportation. She really was calling to notify MD that her BP had gone up. Reinforced need for immediate medical attention and if not able to get a ride to the hospital within 30 min to call 911. She still seemed hesitant to go to the ED. Referrals [facility] - ED Disagree/Comply: Comply

## 2015-05-22 NOTE — Telephone Encounter (Signed)
Spoke with patient and she was agreeable to OV. Will see Dr. Damita Dunnings at 2:45. No CP, only mild SOB and elevated BP. She thinks it is probably related to recent decrease in lasix.

## 2015-05-22 NOTE — Progress Notes (Signed)
Pre visit review using our clinic review tool, if applicable. No additional management support is needed unless otherwise documented below in the visit note.  Was taking 40mg  qd of lasix prev.  Backed down to 20mg .  In meantime had chest pressure this AM.  Was more SOB this AM.  No BLE edema. "I feel swollen."  More abd bloating but not BLE edema. No CP now.  No wheeze.  Was sleeping on 1 pillow.  She notes sig UOP with 40mg  lasix but not with 20mg  dose.  Took 20mg  this AM.  CKD noted.  Not on K replacement.   Meds, vitals, and allergies reviewed.   ROS: Per HPI unless specifically indicated in ROS section   nad ncat Mmm Neck supple, No LA rrr Ctab, no inc in wob, speaking in complete sentences abd soft, not ttp Ext w/o edema Skin well perfused.

## 2015-05-22 NOTE — Telephone Encounter (Signed)
PLEASE NOTE: All timestamps contained within this report are represented as [place] Standard Time. CONFIDENTIALTY NOTICE: This fax transmission is intended only for the addressee. It contains information that is legally privileged, confidential or otherwise protected from use or disclosure. If you are not the intended recipient, you are strictly prohibited from reviewing, disclosing, copying using or disseminating any of this information or taking any action in reliance on or regarding this information. If you have received this fax in error, please notify us immediately by telephone so that we can arrange for its return to [place]. Phone: [phone], Toll-Free: [phone], Fax: [phone] Page: 1 of 2 Call Id: [id number] [place] Patient Name: Nicole English Gender: Female DOB: Oct 23, 1932 Age: 82 Y 2 M 2 D Return Phone Number: [phone] (Primary) Address: [address] City/State/Zip: [place] [address] Client [facility] - Client Client Site [place] Physician Ria Bush - MD Contact Type Call Who Is Calling Patient / Member / Family / Caregiver Call Type Triage / Clinical Relationship To Patient Self Return Phone Number Please choose phone number Chief Complaint BREATHING - shortness of breath or sounds breathless Reason for Call Symptomatic / Request for Health Information Initial Comment [name] states BP was up this morning, having difficulty breathing. Still at 157/123 Appointment Disposition EMR Appointment Not Necessary Info pasted into Epic Yes PreDisposition Call Doctor Translation No Nurse Assessment Nurse: Ronnald Ramp, RN, Miranda Date/Time (Eastern Time): 05/22/2015 1:20:29 PM Confirm and document reason for call. If symptomatic, describe symptoms. You must click the next button to save text entered. ---[name]  states her BP is 157/123 and having trouble breathing and fullness in her chest. She states her MD changed her blood pressure medicine recently. Has the patient traveled out of the country within the last 30 days? ---Not Applicable Does the patient have any new or worsening symptoms? ---Yes Will a triage be completed? ---Yes Related visit to physician within the last 2 weeks? ---Yes Does the PT have any chronic conditions? (i.e. diabetes, asthma, etc.) ---Yes List chronic conditions. ---HTN Is this a behavioral health or substance abuse call? ---No Guidelines Guideline Title Affirmed Question Affirmed Notes Nurse Date/Time (Eastern Time) High Blood Pressure [1] BP # 160 / 100 AND [2] cardiac or neurologic symptoms (e.g., chest pain, difficulty breathing, unsteady gait, blurred vision) Ronnald Ramp, RN, Miranda 05/22/2015 1:20:44 PM PLEASE NOTE: All timestamps contained within this report are represented as [place] Standard Time. CONFIDENTIALTY NOTICE: This fax transmission is intended only for the addressee. It contains information that is legally privileged, confidential or otherwise protected from use or disclosure. If you are not the intended recipient, you are strictly prohibited from reviewing, disclosing, copying using or disseminating any of this information or taking any action in reliance on or regarding this information. If you have received this fax in error, please notify us immediately by telephone so that we can arrange for its return to [place]. Phone: [phone], Toll-Free: [phone], Fax: [phone] Page: 2 of 2 Call Id: [id number] [place]. Time Eilene Ghazi Time) Disposition Final User 05/22/2015 1:15:49 PM Send to Urgent [place] 05/22/2015 1:16:31 PM Send to Urgent Toni Arthurs 05/22/2015 1:22:15 PM Go to ED Now Yes Ronnald Ramp, RN, Judge Stall Understands: Yes Disagree/Comply: Comply Care Advice Given Per Guideline GO TO ED NOW: You need to be seen in the  Emergency Department. Go to the ER at ___________ [place]  now. Drive carefully. CARE ADVICE given per High Blood Pressure (Adult) guideline. CALL EMS 911 IF: * Patient passes out, starts acting confused or becomes too weak to stand. * You become worse. NOTE TO TRIAGER - DRIVING: * Another adult should drive. * If immediate transportation is not available via car or taxi, then the patient should be instructed to call [id number]. Comments User: Leverne Humbles, RN Date/Time (Eastern Time): 05/22/2015 1:25:09 PM [name] was not sure she was going to the ED due to transportation. She really was calling to notify MD that her BP had gone up. Reinforced need for immediate medical attention and if not able to get a ride to the hospital within 30 min to call 911. She still seemed hesitant to go to the ED. User: Leverne Humbles, RN Date/Time (Eastern Time): 05/22/2015 1:28:32 PM Called backline and spoke with Melissa to notify of possible refused ED outcome. Referrals [facility] - ED

## 2015-05-22 NOTE — Telephone Encounter (Signed)
Pt called with an update Is having a hard time breathing Bp 160/104  cb number is [phone] Transferred to [facility]

## 2015-05-22 NOTE — Telephone Encounter (Signed)
Attempted to call patient. No answer and no voicemail.  °

## 2015-05-22 NOTE — Telephone Encounter (Addendum)
If pt agreeable, would just have her return to previous dose of lasix 40mg  daily and monitor for improvement on higher dose. Call us Monday with update. I don't think she needs OV.

## 2015-05-22 NOTE — Patient Instructions (Addendum)
Only take tramadol twice a day, maximum.  Take 40mg  lasix today. Go to the lab on the way out.  We'll contact you with your lab report. Starting tomorrow, take 30mg  of lasix and update Dr. Darnell Level.  Take care.  Glad to see you.

## 2015-05-22 NOTE — Telephone Encounter (Signed)
Noted. Thanks.

## 2015-05-23 ENCOUNTER — Telehealth: Payer: Self-pay

## 2015-05-23 LAB — BASIC METABOLIC PANEL
BUN: 39 mg/dL — ABNORMAL HIGH (ref 6–23)
CALCIUM: 9.3 mg/dL (ref 8.4–10.5)
CHLORIDE: 109 meq/L (ref 96–112)
CO2: 22 mEq/L (ref 19–32)
CREATININE: 2.19 mg/dL — AB (ref 0.40–1.20)
GFR: 22.88 mL/min — ABNORMAL LOW (ref >60.00)
Glucose, Bld: 89 mg/dL (ref 70–99)
Potassium: 4.1 mEq/L (ref 3.5–5.1)
Sodium: 142 mEq/L (ref 135–145)

## 2015-05-23 MED ORDER — FUROSEMIDE 20 MG PO TABS: 30.0000 mg | ORAL_TABLET | Freq: Every day | ORAL | Status: DC

## 2015-05-23 NOTE — Telephone Encounter (Signed)
See G's note below along with the bmet results.  Continue 30mg  lasix a day and update G as needed.  Thanks.

## 2015-05-23 NOTE — Telephone Encounter (Addendum)
Noted. Thank you - continue lasix 30mg  daily.  New dose sent to pharmacy plz notify patient to continue 30mg  dose

## 2015-05-23 NOTE — Telephone Encounter (Signed)
Patient advised.

## 2015-05-23 NOTE — Assessment & Plan Note (Signed)
I'll defer prev syncope w/u to PCP.   Given CKD, only take tramadol twice a day, maximum.  Likely with mild fluid retention.   Take 40mg  lasix today.  bmet today.   Starting tomorrow, take 30mg  of lasix and update Dr. Darnell Level.  She agrees with plan.

## 2015-05-23 NOTE — Telephone Encounter (Signed)
Pt left v/m; pt was seen on 05/22/15 and was to cb today with update; pt taking Lasix 30 mg and doing well; pt said " Lasix 30 mg unlocked everything that was held in body; very successful and things are better today". Today BP 129/92 P58. Pt request cb.

## 2015-05-26 ENCOUNTER — Other Ambulatory Visit: Payer: Self-pay

## 2015-05-26 ENCOUNTER — Ambulatory Visit
Admission: RE | Admit: 2015-05-26 | Discharge: 2015-05-26 | Disposition: A | Discharge status: Home / Self Care | Payer: Medicare Other | Source: Ambulatory Visit

## 2015-05-26 ENCOUNTER — Other Ambulatory Visit: Payer: Self-pay | Admitting: Family Medicine

## 2015-05-26 DIAGNOSIS — N6320 Unspecified lump in the left breast, unspecified quadrant: Secondary | ICD-10-CM

## 2015-05-26 DIAGNOSIS — N63 Unspecified lump in breast: Secondary | ICD-10-CM | POA: Diagnosis not present

## 2015-05-26 DIAGNOSIS — T8543XA Leakage of breast prosthesis and implant, initial encounter: Secondary | ICD-10-CM

## 2015-05-26 DIAGNOSIS — Z1231 Encounter for screening mammogram for malignant neoplasm of breast: Secondary | ICD-10-CM

## 2015-05-26 LAB — HM MAMMOGRAPHY

## 2015-05-30 ENCOUNTER — Encounter: Payer: Self-pay | Admitting: *Deleted

## 2015-06-04 ENCOUNTER — Other Ambulatory Visit (INDEPENDENT_AMBULATORY_CARE_PROVIDER_SITE_OTHER): Payer: Medicare Other

## 2015-06-04 ENCOUNTER — Other Ambulatory Visit: Payer: Medicare Other

## 2015-06-04 DIAGNOSIS — N184 Chronic kidney disease, stage 4 (severe): Secondary | ICD-10-CM

## 2015-06-04 DIAGNOSIS — I10 Essential (primary) hypertension: Secondary | ICD-10-CM | POA: Diagnosis not present

## 2015-06-04 LAB — BASIC METABOLIC PANEL
BUN: 44 mg/dL — ABNORMAL HIGH (ref 6–23)
CHLORIDE: 110 meq/L (ref 96–112)
CO2: 23 meq/L (ref 19–32)
Calcium: 9.6 mg/dL (ref 8.4–10.5)
Creatinine, Ser: 2.17 mg/dL — ABNORMAL HIGH (ref 0.40–1.20)
GFR: 23.12 mL/min — ABNORMAL LOW (ref >60.00)
Glucose, Bld: 87 mg/dL (ref 70–99)
POTASSIUM: 4.9 meq/L (ref 3.5–5.1)
SODIUM: 140 meq/L (ref 135–145)

## 2015-06-11 ENCOUNTER — Telehealth: Payer: Self-pay | Admitting: Family Medicine

## 2015-06-11 ENCOUNTER — Other Ambulatory Visit: Payer: Self-pay | Admitting: Family Medicine

## 2015-06-11 DIAGNOSIS — G8929 Other chronic pain: Secondary | ICD-10-CM

## 2015-06-11 DIAGNOSIS — M549 Dorsalgia, unspecified: Secondary | ICD-10-CM

## 2015-06-11 DIAGNOSIS — R531 Weakness: Secondary | ICD-10-CM

## 2015-06-11 NOTE — Telephone Encounter (Signed)
Spoke to pt about PT referral and she wants to know if she can have Home Health PT, no outpatient. Please advise.

## 2015-06-12 NOTE — Telephone Encounter (Signed)
Unfortunately I don't think she will qualify for home health. I believe she continues to drive.
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
## 2015-06-17 NOTE — Telephone Encounter (Signed)
Will discuss this at her f/u on Thursday.

## 2015-06-17 NOTE — Telephone Encounter (Signed)
Patient notified as instructed by telephone and verbalized understanding. 

## 2015-06-19 ENCOUNTER — Other Ambulatory Visit: Payer: Self-pay

## 2015-06-19 ENCOUNTER — Ambulatory Visit: Payer: Medicare Other | Admitting: Family Medicine

## 2015-06-19 MED ORDER — TRAMADOL HCL 50 MG PO TABS: 50.0000 mg | ORAL_TABLET | Freq: Two times a day (BID) | ORAL | Status: DC | PRN

## 2015-06-19 NOTE — Telephone Encounter (Signed)
Pt left v/m requesting refill on tramadol to [facility]. Last refilled # 60 on 05/15/15. Pt last seen 05/22/15. Pt had appt today but no transportation and has appt scheduled on 06/26/15.

## 2015-06-19 NOTE — Telephone Encounter (Signed)
plz phone in. 

## 2015-06-20 NOTE — Telephone Encounter (Signed)
Rx called in as directed.   

## 2015-06-26 ENCOUNTER — Ambulatory Visit (INDEPENDENT_AMBULATORY_CARE_PROVIDER_SITE_OTHER): Payer: Medicare Other | Admitting: Family Medicine

## 2015-06-26 ENCOUNTER — Encounter: Payer: Self-pay | Admitting: Gastroenterology

## 2015-06-26 ENCOUNTER — Encounter: Payer: Self-pay | Admitting: Family Medicine

## 2015-06-26 VITALS — BP 114/78 | HR 52 | Temp 97.6°F | Wt 118.5 lb

## 2015-06-26 DIAGNOSIS — R32 Unspecified urinary incontinence: Secondary | ICD-10-CM | POA: Diagnosis not present

## 2015-06-26 DIAGNOSIS — R159 Full incontinence of feces: Secondary | ICD-10-CM | POA: Insufficient documentation

## 2015-06-26 DIAGNOSIS — R0781 Pleurodynia: Secondary | ICD-10-CM

## 2015-06-26 DIAGNOSIS — R197 Diarrhea, unspecified: Secondary | ICD-10-CM

## 2015-06-26 LAB — CBC WITH DIFFERENTIAL/PLATELET
BASOS ABS: 0.1 10*3/uL (ref 0.0–0.1)
BASOS PCT: 0.8 % (ref 0.0–3.0)
EOS ABS: 0.3 10*3/uL (ref 0.0–0.7)
Eosinophils Relative: 4.3 % (ref 0.0–5.0)
HCT: 36.6 % (ref 36.0–46.0)
Hemoglobin: 11.9 g/dL — ABNORMAL LOW (ref 12.0–15.0)
Lymphocytes Relative: 24 % (ref 12.0–46.0)
Lymphs Abs: 1.7 10*3/uL (ref 0.7–4.0)
MCHC: 32.6 g/dL (ref 30.0–36.0)
MCV: 94.5 fl (ref 78.0–100.0)
MONO ABS: 0.7 10*3/uL (ref 0.1–1.0)
Monocytes Relative: 10.3 % (ref 3.0–12.0)
NEUTROS ABS: 4.2 10*3/uL (ref 1.4–7.7)
Neutrophils Relative %: 60.6 % (ref 43.0–77.0)
PLATELETS: 294 10*3/uL (ref 150.0–400.0)
RBC: 3.87 Mil/uL (ref 3.87–5.11)
RDW: 15 % (ref 11.5–15.5)
WBC: 7 10*3/uL (ref 4.0–10.5)

## 2015-06-26 LAB — TSH: TSH: 3.99 u[IU]/mL (ref 0.35–4.50)

## 2015-06-26 LAB — COMPREHENSIVE METABOLIC PANEL
ALT: 11 U/L (ref 0–35)
AST: 15 U/L (ref 0–37)
Albumin: 4.1 g/dL (ref 3.5–5.2)
Alkaline Phosphatase: 76 U/L (ref 39–117)
BUN: 45 mg/dL — AB (ref 6–23)
CHLORIDE: 107 meq/L (ref 96–112)
CO2: 24 meq/L (ref 19–32)
CREATININE: 2.2 mg/dL — AB (ref 0.40–1.20)
Calcium: 9.6 mg/dL (ref 8.4–10.5)
GFR: 22.75 mL/min — ABNORMAL LOW (ref >60.00)
GLUCOSE: 93 mg/dL (ref 70–99)
Potassium: 4.3 mEq/L (ref 3.5–5.1)
SODIUM: 140 meq/L (ref 135–145)
Total Bilirubin: 0.3 mg/dL (ref 0.2–1.2)
Total Protein: 6.9 g/dL (ref 6.0–8.3)

## 2015-06-26 LAB — SEDIMENTATION RATE: Sed Rate: 14 mm/hr (ref 0–30)

## 2015-06-26 NOTE — Assessment & Plan Note (Signed)
New concern today. Noticing worsening stool incontinence over last few months. See below. Declines DRE today. Will refer to GI for further evalutaion.
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
## 2015-06-26 NOTE — Patient Instructions (Signed)
Good to see you today. We will refer you to stomach doctor today.
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
## 2015-06-26 NOTE — Progress Notes (Signed)
Pre visit review using our clinic review tool, if applicable. No additional management support is needed unless otherwise documented below in the visit note. 

## 2015-06-26 NOTE — Assessment & Plan Note (Signed)
Improved with lower lasix dose - continue 30mg  daily.

## 2015-06-26 NOTE — Assessment & Plan Note (Addendum)
In presumed h/o IBS but pt endorses worsening recently, mainly at night time, associated with some bowel incontinence. Has not found food to trigger this. Not on any meds that should cause diarrhea. Denies any stool softener or laxative use.  Check ESR, CBC, LFT and TSH today.  ?lymphocytic colitis.  No signs of dehydration today.

## 2015-07-01 DIAGNOSIS — N2581 Secondary hyperparathyroidism of renal origin: Secondary | ICD-10-CM | POA: Diagnosis not present

## 2015-07-01 DIAGNOSIS — D631 Anemia in chronic kidney disease: Secondary | ICD-10-CM | POA: Diagnosis not present

## 2015-07-01 DIAGNOSIS — R809 Proteinuria, unspecified: Secondary | ICD-10-CM | POA: Diagnosis not present

## 2015-07-01 DIAGNOSIS — I129 Hypertensive chronic kidney disease with stage 1 through stage 4 chronic kidney disease, or unspecified chronic kidney disease: Secondary | ICD-10-CM | POA: Diagnosis not present

## 2015-07-01 DIAGNOSIS — E875 Hyperkalemia: Secondary | ICD-10-CM | POA: Diagnosis not present

## 2015-07-01 DIAGNOSIS — N184 Chronic kidney disease, stage 4 (severe): Secondary | ICD-10-CM | POA: Diagnosis not present

## 2015-07-11 ENCOUNTER — Telehealth: Payer: Self-pay | Admitting: Family Medicine

## 2015-07-11 NOTE — Telephone Encounter (Signed)
Pt called and left vm on referrals. Pt now thinks her pain is from her gall bladder and wants to make sure she should still see GI doctor. Pt is scheduled to see [facility] 07/17/15. Please advise  [phone]  Pt request call back

## 2015-07-13 ENCOUNTER — Encounter: Payer: Self-pay | Admitting: Family Medicine

## 2015-07-13 NOTE — Telephone Encounter (Signed)
Still ok to see GI. Thanks.

## 2015-07-14 NOTE — Telephone Encounter (Signed)
Attempted to call patient. No answer and no VM. Unable to leave message. Will try again later.

## 2015-07-15 NOTE — Telephone Encounter (Signed)
Attempted to call patient. No answer and no VM. Unable to leave message. Will try again later.

## 2015-07-16 ENCOUNTER — Encounter: Payer: Self-pay | Admitting: *Deleted

## 2015-07-16 NOTE — Telephone Encounter (Signed)
GI appt tomorrow.

## 2015-07-17 ENCOUNTER — Encounter: Payer: Self-pay | Admitting: Physician Assistant

## 2015-07-17 ENCOUNTER — Ambulatory Visit (INDEPENDENT_AMBULATORY_CARE_PROVIDER_SITE_OTHER): Payer: Medicare Other | Admitting: Physician Assistant

## 2015-07-17 VITALS — BP 94/60 | HR 53 | Ht 62.0 in | Wt 115.0 lb

## 2015-07-17 DIAGNOSIS — R1084 Generalized abdominal pain: Secondary | ICD-10-CM

## 2015-07-17 DIAGNOSIS — R159 Full incontinence of feces: Secondary | ICD-10-CM | POA: Diagnosis not present

## 2015-07-17 DIAGNOSIS — R1011 Right upper quadrant pain: Secondary | ICD-10-CM

## 2015-07-17 NOTE — Patient Instructions (Signed)
Take Benefiber , one dose daily. Put one dose in a glass of water daily.   You have been scheduled for a CT scan of the abdomen and pelvis at [facility] ([address]---this is in the same building as Press [facility]).   You are scheduled on 07-24-2015 Thursday at 9:30 am. You should arrive  At 9:15 minutes prior to your appointment time for registration. Please follow the written instructions below on the day of your exam:  WARNING: IF YOU ARE ALLERGIC TO IODINE/X-RAY DYE, PLEASE NOTIFY RADIOLOGY IMMEDIATELY AT [phone]! YOU WILL BE GIVEN A 13 HOUR PREMEDICATION PREP.  1) Do not eat  anything after 5:30 am (4 hours prior to your test) 2) You have been given 2 bottles of oral contrast to drink. The solution may taste  better if refrigerated, but do NOT add ice or any other liquid to this solution. Shake  well before drinking.    Drink 1 bottle of contrast @  7:30 am (2 hours prior to your exam)  Drink 1 bottle of contrast @ 8:30 am (1 hour prior to your exam)  You may take any medications as prescribed with a small amount of water except for the following: Metformin, Glucophage, Glucovance, Avandamet, Riomet, Fortamet, Actoplus Met, Janumet, Glumetza or Metaglip. The above medications must be held the day of the exam AND 48 hours after the exam.  The purpose of you drinking the oral contrast is to aid in the visualization of your intestinal tract. The contrast solution may cause some diarrhea. Before your exam is started, you will be given a small amount of fluid to drink. Depending on your individual set of symptoms, you may also receive an intravenous injection of x-ray contrast/dye. Plan on being at [facility] for 30 minutes or long, depending on the type of exam you are having performed.  If you have any questions regarding your exam or if you need to reschedule, you may call the CT department at [phone] between the hours of 8:00 am and 5:00 pm,  Monday-Friday.  ________________________________________________________________________
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
## 2015-07-18 ENCOUNTER — Ambulatory Visit: Payer: Medicare Other | Admitting: Cardiology

## 2015-07-19 DIAGNOSIS — I700 Atherosclerosis of aorta: Secondary | ICD-10-CM

## 2015-07-19 HISTORY — DX: Atherosclerosis of aorta: I70.0

## 2015-07-21 NOTE — Progress Notes (Signed)
Reviewed and agree with documentation and assessment and plan. K. Veena Nandigam , MD   

## 2015-07-23 ENCOUNTER — Telehealth: Payer: Self-pay | Admitting: Physician Assistant

## 2015-07-23 NOTE — Telephone Encounter (Signed)
Patient rescheduled and instructed. Arrive at 1:15 pm on 07/30/15. !st bottle of contrast at 11:30 am and 2nd bottle of contrast at 12:30 pm. CT will be done at [facility] on [address].

## 2015-07-23 NOTE — Telephone Encounter (Signed)
Patient requests the CT be rescheduled due to illness.

## 2015-07-24 ENCOUNTER — Inpatient Hospital Stay: Admission: RE | Admit: 2015-07-24 | Payer: Medicare Other | Source: Ambulatory Visit

## 2015-07-30 ENCOUNTER — Other Ambulatory Visit: Payer: Self-pay

## 2015-07-30 ENCOUNTER — Ambulatory Visit (INDEPENDENT_AMBULATORY_CARE_PROVIDER_SITE_OTHER)
Admission: RE | Admit: 2015-07-30 | Discharge: 2015-07-30 | Disposition: A | Discharge status: Home / Self Care | Payer: Medicare Other | Source: Ambulatory Visit | Attending: Physician Assistant | Admitting: Physician Assistant

## 2015-07-30 DIAGNOSIS — R159 Full incontinence of feces: Secondary | ICD-10-CM | POA: Diagnosis not present

## 2015-07-30 DIAGNOSIS — R1084 Generalized abdominal pain: Secondary | ICD-10-CM

## 2015-07-30 DIAGNOSIS — K8020 Calculus of gallbladder without cholecystitis without obstruction: Secondary | ICD-10-CM | POA: Diagnosis not present

## 2015-07-30 DIAGNOSIS — R1011 Right upper quadrant pain: Secondary | ICD-10-CM | POA: Diagnosis not present

## 2015-07-30 NOTE — Telephone Encounter (Signed)
Last filled 06/19/15--please advise if okay to refill

## 2015-07-31 MED ORDER — TRAMADOL HCL 50 MG PO TABS: 50.0000 mg | ORAL_TABLET | Freq: Two times a day (BID) | ORAL | Status: DC | PRN

## 2015-07-31 NOTE — Telephone Encounter (Signed)
Left refill on voice mail at pharmacy  

## 2015-07-31 NOTE — Telephone Encounter (Signed)
plz phone in. 

## 2015-08-01 ENCOUNTER — Telehealth: Payer: Self-pay | Admitting: Physician Assistant

## 2015-08-01 NOTE — Telephone Encounter (Signed)
Spoke with the patient. She had a CT with contrast roughly 24 hours ago. She is calling with complaints of constant diarrhea. No abdominal pain, just abdominal bloating. She is tolerating PO fluids without nausea or vomiting. PO intake does cause her to move her bowels. She is not moving around because she will be incontinent. Any suggestions?

## 2015-08-01 NOTE — Telephone Encounter (Signed)
IB guard 1-2 capsules as needed twice daily for bloating and Imodium 1-2 tablets as needed every 4-6 hours for diarrhea.

## 2015-08-06 ENCOUNTER — Encounter: Payer: Self-pay | Admitting: Family Medicine

## 2015-08-22 ENCOUNTER — Ambulatory Visit: Payer: Medicare Other | Admitting: Gastroenterology

## 2015-08-29 ENCOUNTER — Telehealth: Payer: Self-pay | Admitting: Physician Assistant

## 2015-08-29 NOTE — Telephone Encounter (Signed)
Doc of the day Reports she has had an off and on fever all week. Her lower abdomen hurts into her back. She states she has to supplement her Tramadol and Gabapentin with ES Tylenol. Loose stool. No diarrhea or bloody stool. No nausea or vomiting. Diverticulosis noted at last colonoscopy 2002. Urgent care?

## 2015-08-31 NOTE — Telephone Encounter (Signed)
Yes, urgent care visit is appropriate.  Recent CT showed no significant pathology

## 2015-09-01 NOTE — Telephone Encounter (Signed)
Patient had been advised of this on Friday.

## 2015-09-08 ENCOUNTER — Other Ambulatory Visit: Payer: Self-pay

## 2015-09-08 MED ORDER — TRAMADOL HCL 50 MG PO TABS: 50.0000 mg | ORAL_TABLET | Freq: Two times a day (BID) | ORAL | 0 refills | Status: DC | PRN

## 2015-09-08 NOTE — Telephone Encounter (Signed)
Pt left v/m requesting refill tramadol. Call when called in to [facility]. Last refilled # 60 on 07/31/15. Last seen 06/26/15. Pt is out of med.

## 2015-09-08 NOTE — Telephone Encounter (Signed)
plz phone in. 

## 2015-09-09 ENCOUNTER — Ambulatory Visit: Payer: Medicare Other | Admitting: Gastroenterology

## 2015-09-09 NOTE — Telephone Encounter (Signed)
Called in to [facility] ([address] [place]: [phone]

## 2015-09-11 ENCOUNTER — Ambulatory Visit (INDEPENDENT_AMBULATORY_CARE_PROVIDER_SITE_OTHER): Payer: Medicare Other | Admitting: Gastroenterology

## 2015-09-11 ENCOUNTER — Encounter: Payer: Self-pay | Admitting: Gastroenterology

## 2015-09-11 ENCOUNTER — Encounter (INDEPENDENT_AMBULATORY_CARE_PROVIDER_SITE_OTHER): Payer: Self-pay

## 2015-09-11 VITALS — BP 110/70 | HR 64 | Ht 61.25 in | Wt 114.0 lb

## 2015-09-11 DIAGNOSIS — R197 Diarrhea, unspecified: Secondary | ICD-10-CM

## 2015-09-11 DIAGNOSIS — R1011 Right upper quadrant pain: Secondary | ICD-10-CM | POA: Diagnosis not present

## 2015-09-11 DIAGNOSIS — E739 Lactose intolerance, unspecified: Secondary | ICD-10-CM

## 2015-09-11 NOTE — Patient Instructions (Signed)
Lactose-Free Diet, Adult If you have lactose intolerance, you are not able to digest lactose. Lactose is a natural sugar found mainly in milk and milk products. You may need to avoid all foods and beverages that contain lactose. A lactose-free diet can help you do this.  WHAT DO I NEED TO KNOW ABOUT THIS DIET?  Do not consume foods, beverages, vitamins, minerals, or medicines with lactose. Read ingredients lists carefully.  Look for the words "lactose-free" on labels.  Use lactase enzyme drops or tablets as directed by your health care provider.  Use lactose-free milk or a milk alternative, such as soy milk, for drinking and cooking.  Make sure you get enough calcium and vitamin D in your diet. A lactose-free eating plan can be lacking in these important nutrients.  Take calcium and vitamin D supplements as directed by your health care provider. Talk to your provider about supplements if you are not able to get enough calcium and vitamin D from food. WHICH FOODS HAVE LACTOSE? Lactose is found in:   Milk and foods made from milk.  Yogurt.   Cheese.  Butter.   Margarine.   Sour cream.   Cream.   Whipped toppings and nondairy creamers.  Ice cream and other milk-based desserts. Lactose is also found in foods or products made with milk or milk ingredients. To find out whether a food contains milk or a milk ingredient, look at the ingredients list. Avoid foods with the statement "May contain milk" and foods that contain:   Butter.   Cream.  Milk.  Milk solids.  Milk powder.   Whey.  Curd.  Caseinate.  Lactose.  Lactalbumin.  Lactoglobulin. WHAT ARE SOME ALTERNATIVES TO MILK AND FOODS MADE WITH MILK PRODUCTS?  Lactose-free milk.  Soy milk with added calcium and vitamin D.  Almond, coconut, or rice milk with added calcium and vitamin D. Note that these are low in protein.   Soy products, such as soy yogurt, soy cheese, soy ice cream, and  soy-based sour cream. WHICH FOODS CAN I EAT? Grains Breads and rolls made without milk, such as [place], [place], or [place] bread, bagels, pita, and [employer]. Corn tortillas, corn meal, grits, and polenta. Crackers without lactose or milk solids, such as soda crackers and graham crackers. Cooked or dry cereals without lactose or milk solids. Pasta, quinoa, couscous, barley, oats, bulgur, farro, rice, wild rice, or other grains prepared without milk or lactose. Plain popcorn.  Vegetables Fresh, frozen, and canned vegetables without cheese, cream, or butter sauces. Fruits All fresh, canned, frozen, or dried fruits that are not processed with lactose. Meats and Other Protein Sources Plain beef, chicken, fish, [place], lamb, veal, pork, wild game, or ham. Kosher-prepared meat products. Strained or junior meats that do not contain milk. Eggs. Soy meat substitutes. Beans, lentils, and hummus. Tofu. Nuts and seeds. Peanut or other nut butters without lactose. Soups, casseroles, and mixed dishes without cheese, cream, or milk.  Dairy Lactose-free milk. Soy, rice, or almond milk with added calcium and vitamin D. Soy cheese and yogurt. Beverages Carbonated drinks. Tea. Coffee, freeze-dried coffee, and some instant coffees. Fruit and vegetable juices.  Condiments Soy sauce. Carob powder. Olives. Gravy made with water. Baker's cocoa. Angie Fava. Pure seasonings and spices. Ketchup. Mustard. Bouillon. Broth.  Sweets and Desserts Water and fruit ices. Gelatin. Cookies, pies, or cakes made from allowed ingredients, such as angel food cake. Pudding made with water or a milk substitute. Lactose-free tofu desserts. Soy, coconut milk, or rice-milk-based frozen desserts.  Sugar. Honey. Jam, jelly, and marmalade. Molasses. Pure sugar candy. Dark chocolate without milk. Marshmallows.  Fats and Oils Margarines and salad dressings that do not contain milk. Berniece Salines. Vegetable oils. Shortening. Mayonnaise. Soy or coconut-based  cream.  The items listed above may not be a complete list of recommended foods or beverages. Contact your dietitian for more options.  WHICH FOODS ARE NOT RECOMMENDED? Grains Breads and rolls that contain milk. Toaster pastries. Muffins, biscuits, waffles, cornbread, and pancakes. These can be prepared at home, commercial, or from mixes. Sweet rolls, donuts, English muffins, fry bread, lefse, flour tortillas with lactose, or [place] toast made with milk or milk ingredients. Crackers that contain lactose. Corn curls. Cooked or dry cereals with lactose. Vegetables Creamed or breaded vegetables. Vegetables in a cheese or butter sauce or with lactose-containing margarines. Instant potatoes. [place] fries. Scalloped or au gratin potatoes.  Fruits None.  Meats and Other Protein Sources Scrambled eggs, omelets, and souffles that contain milk. Creamed or breaded meat, fish, chicken, or [place]. Sausage products, such as wieners and liver sausage. Cold cuts that contain milk solids. Cheese, cottage cheese, ricotta cheese, and cheese spreads. Lasagna and macaroni and cheese. Pizza. Peanut or other nut butters with added milk solids. Casseroles or mixed dishes containing milk or cheese.  Dairy All dairy products, including milk, goat's milk, buttermilk, kefir, acidophilus milk, flavored milk, evaporated milk, condensed milk, dulce de [place], eggnog, yogurt, cheese, and cheese spreads.  Beverages Hot chocolate. Cocoa with lactose. Instant iced teas. Powdered fruit drinks. Smoothies made with milk or yogurt.  Condiments Chewing gum that has lactose. Cocoa that has lactose. Spice blends if they contain milk products. Artificial sweeteners that contain lactose. Nondairy creamers.  Sweets and Desserts Ice cream, ice milk, gelato, sherbet, and frozen yogurt. Custard, pudding, and mousse. Cake, cream pies, cookies, and other desserts containing milk, cream, cream cheese, or milk chocolate. Pie crust made with  milk-containing margarine or butter. Reduced-calorie desserts made with a sugar substitute that contains lactose. Toffee and butterscotch. Milk, white, or dark chocolate that contains milk. Fudge. Caramel.  Fats and Oils Margarines and salad dressings that contain milk or cheese. Cream. Half and half. Cream cheese. Sour cream. Chip dips made with sour cream or yogurt.  The items listed above may not be a complete list of foods and beverages to avoid. Contact your dietitian for more information. AM I GETTING ENOUGH CALCIUM? Calcium is found in many foods that contain lactose and is important for bone health. The amount of calcium you need depends on your age:   Adults younger than 50 years: 1000 mg of calcium a day.  Adults older than 50 years: 1200 mg of calcium a day. If you are not getting enough calcium, other calcium sources include:   Orange juice with calcium added. There are 300-350 mg of calcium in 1 cup of orange juice.   Sardines with edible bones. There are 325 mg of calcium in 3 oz of sardines.   Calcium-fortified soy milk. There are 300-400 mg of calcium in 1 cup of calcium-fortified soy milk.  Calcium-fortified rice or almond milk. There are 300 mg of calcium in 1 cup of calcium-fortified rice or almond milk.  Canned salmon with edible bones. There are 180 mg of calcium in 3 oz of canned salmon with edible bones.   Calcium-fortified breakfast cereals. There are [id number] mg of calcium in calcium-fortified breakfast cereals.   Tofu set with calcium sulfate. There are 250 mg of calcium in  cup  of tofu set with calcium sulfate.  Spinach, cooked. There are 145 mg of calcium in  cup of cooked spinach.  Edamame, cooked. There are 130 mg of calcium in  cup of cooked edamame.  Collard greens, cooked. There are 125 mg of calcium in  cup of cooked collard greens.  Kale, frozen or cooked. There are 90 mg of calcium in  cup of cooked or frozen kale.   Almonds. There are  95 mg of calcium in  cup of almonds.  Broccoli, cooked. There are 60 mg of calcium in 1 cup of cooked broccoli.   This information is not intended to replace advice given to you by your health care provider. Make sure you discuss any questions you have with your health care provider.   Document Released: 06/26/2001 Document Revised: 05/21/2014 Document Reviewed: 04/06/2013 Elsevier Interactive Patient Education 2016 [employer].  Use VSL #3 112 BU daily, this can be puchased over the counter at your pharmacy
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
## 2015-10-16 ENCOUNTER — Other Ambulatory Visit: Payer: Self-pay

## 2015-10-16 MED ORDER — TRAMADOL HCL 50 MG PO TABS: 50.0000 mg | ORAL_TABLET | Freq: Two times a day (BID) | ORAL | 0 refills | Status: DC | PRN

## 2015-10-16 MED ORDER — GABAPENTIN 100 MG PO CAPS: 100.0000 mg | ORAL_CAPSULE | Freq: Two times a day (BID) | ORAL | 6 refills | Status: DC

## 2015-10-16 NOTE — Telephone Encounter (Signed)
plz phone in tramadol thanks.

## 2015-10-16 NOTE — Telephone Encounter (Signed)
Pt left v/m requesting refills gabapentin (last refilled # 60 x 3 on 05/20/15) and tramadol (last refilled # 60 on 09/08/15). Pt is out of med. Last seen f/u on 06/26/15.[email or web address].

## 2015-10-16 NOTE — Telephone Encounter (Signed)
Rx called in as directed.   

## 2015-11-05 ENCOUNTER — Other Ambulatory Visit: Payer: Self-pay | Admitting: Family Medicine

## 2015-11-18 ENCOUNTER — Other Ambulatory Visit: Payer: Self-pay | Admitting: Family Medicine

## 2015-11-19 ENCOUNTER — Other Ambulatory Visit: Payer: Self-pay | Admitting: Family Medicine

## 2015-11-19 NOTE — Telephone Encounter (Signed)
plz phone in. 

## 2015-11-19 NOTE — Telephone Encounter (Signed)
Rx called in as directed.   

## 2015-11-25 ENCOUNTER — Other Ambulatory Visit: Payer: Self-pay | Admitting: Cardiology

## 2015-11-28 DIAGNOSIS — Z23 Encounter for immunization: Secondary | ICD-10-CM | POA: Diagnosis not present
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
## 2015-12-15 ENCOUNTER — Ambulatory Visit: Payer: Medicare Other | Admitting: Gastroenterology

## 2015-12-15 ENCOUNTER — Telehealth: Payer: Self-pay | Admitting: Gastroenterology

## 2015-12-15 NOTE — Telephone Encounter (Signed)
No answer. No voicemail. 

## 2015-12-18 NOTE — Telephone Encounter (Signed)
She is having "bowel disturbances". She says she has an unprovoked sudden urge to move her bowels. She will have such a strong urge that she will have incontinence of liquid stool if she is not already in the bathroom. It is not predictable. She has eliminated lactose, artificial sweeteners and high fructose drinks. She is not on any fiber or stool softeners. This is an ongoing issue.

## 2015-12-18 NOTE — Telephone Encounter (Signed)
Please advise patient to add soluble fiber (Benefiber or Citrucel or any equivalent soluble fiber) 1 tablespoon 3 times a day with meals. Can use Imodium as needed if having multiple bowel movements

## 2015-12-19 NOTE — Telephone Encounter (Signed)
Patient is instructed and agrees to try this plan of care.

## 2015-12-22 ENCOUNTER — Other Ambulatory Visit: Payer: Self-pay | Admitting: Family Medicine

## 2015-12-22 NOTE — Telephone Encounter (Signed)
plz phone in. 

## 2015-12-23 ENCOUNTER — Other Ambulatory Visit: Payer: Self-pay | Admitting: Family Medicine

## 2015-12-23 NOTE — Telephone Encounter (Signed)
Rx called in as directed.   

## 2015-12-23 NOTE — Telephone Encounter (Signed)
Pt wanted to ck status of tramadol refill; pt said she spoke with [facility] and the rx is not ready for pick up yet; advised pt to call back later this afternoon to verify ready for pick up; med was called in this morning around 10 AM. Pt voiced understanding.

## 2015-12-24 ENCOUNTER — Other Ambulatory Visit: Payer: Self-pay | Admitting: Cardiology

## 2015-12-24 NOTE — Telephone Encounter (Signed)
Rx(s) sent to pharmacy electronically.  

## 2015-12-29 DIAGNOSIS — N184 Chronic kidney disease, stage 4 (severe): Secondary | ICD-10-CM | POA: Diagnosis not present

## 2015-12-29 DIAGNOSIS — D631 Anemia in chronic kidney disease: Secondary | ICD-10-CM | POA: Diagnosis not present

## 2015-12-29 DIAGNOSIS — N2581 Secondary hyperparathyroidism of renal origin: Secondary | ICD-10-CM | POA: Diagnosis not present

## 2015-12-29 DIAGNOSIS — I129 Hypertensive chronic kidney disease with stage 1 through stage 4 chronic kidney disease, or unspecified chronic kidney disease: Secondary | ICD-10-CM | POA: Diagnosis not present

## 2016-01-05 ENCOUNTER — Other Ambulatory Visit: Payer: Self-pay | Admitting: Cardiology

## 2016-01-10 ENCOUNTER — Other Ambulatory Visit: Payer: Self-pay | Admitting: Family Medicine

## 2016-01-13 MED ORDER — FUROSEMIDE 20 MG PO TABS: 30.0000 mg | ORAL_TABLET | Freq: Every day | ORAL | 3 refills | Status: DC

## 2016-01-27 ENCOUNTER — Telehealth: Payer: Self-pay | Admitting: Gastroenterology

## 2016-01-27 NOTE — Telephone Encounter (Signed)
States she has pain in her back from the right side of the spine into her should. Occurred at night. "Gagging" but "not nauseated" or vomiting. Denies belching or burping. Remains incontinent of her bowels but denies any change in color. Denies any pain presently or in association with her meals. She does recall her gall bladder as being a topic of discussion at her last visit. She would like to revisit this concern. Appointment scheduled at her request "I need to see somebody about this."

## 2016-01-28 ENCOUNTER — Encounter: Payer: Self-pay | Admitting: Gastroenterology

## 2016-01-28 ENCOUNTER — Ambulatory Visit (INDEPENDENT_AMBULATORY_CARE_PROVIDER_SITE_OTHER): Payer: Medicare Other | Admitting: Gastroenterology

## 2016-01-28 ENCOUNTER — Telehealth: Payer: Self-pay | Admitting: Cardiology

## 2016-01-28 ENCOUNTER — Encounter (INDEPENDENT_AMBULATORY_CARE_PROVIDER_SITE_OTHER): Payer: Self-pay

## 2016-01-28 VITALS — BP 110/66 | HR 58 | Ht 61.25 in | Wt 113.1 lb

## 2016-01-28 DIAGNOSIS — R159 Full incontinence of feces: Secondary | ICD-10-CM | POA: Diagnosis not present

## 2016-01-28 DIAGNOSIS — K8020 Calculus of gallbladder without cholecystitis without obstruction: Secondary | ICD-10-CM | POA: Diagnosis not present

## 2016-01-28 DIAGNOSIS — R1011 Right upper quadrant pain: Secondary | ICD-10-CM

## 2016-01-28 DIAGNOSIS — K589 Irritable bowel syndrome without diarrhea: Secondary | ICD-10-CM | POA: Diagnosis not present

## 2016-01-28 MED ORDER — BISOPROLOL FUMARATE 5 MG PO TABS: 5.0000 mg | ORAL_TABLET | Freq: Every day | ORAL | 0 refills | Status: DC

## 2016-01-28 NOTE — Patient Instructions (Signed)
You have been scheduled for an abdominal ultrasound at [facility] (1st floor of hospital) on 02/02/2016 at 8am. Please arrive 15 minutes prior to your appointment for registration. Make certain not to have anything to eat or drink 6 hours prior to your appointment. Should you need to reschedule your appointment, please contact radiology at [phone]. This test typically takes about 30 minutes to perform.  Use IB Guard 1-2 capsules three times a day as needed    Lactose-Free Diet, Adult Introduction If you have lactose intolerance, you are not able to digest lactose. Lactose is a natural sugar found mainly in milk and milk products. You may need to avoid all foods and beverages that contain lactose. A lactose-free diet can help you do this. What do I need to know about this diet?  Do not consume foods, beverages, vitamins, minerals, or medicines with lactose. Read ingredients lists carefully.  Look for the words "lactose-free" on labels.  Use lactase enzyme drops or tablets as directed by your health care provider.  Use lactose-free milk or a milk alternative, such as soy milk, for drinking and cooking.  Make sure you get enough calcium and vitamin D in your diet. A lactose-free eating plan can be lacking in these important nutrients.  Take calcium and vitamin D supplements as directed by your health care provider. Talk to your provider about supplements if you are not able to get enough calcium and vitamin D from food. Which foods have lactose? Lactose is found in:  Milk and foods made from milk.  Yogurt.  Cheese.  Butter.  Margarine.  Sour cream.  Cream.  Whipped toppings and nondairy creamers.  Ice cream and other milk-based desserts. Lactose is also found in foods or products made with milk or milk ingredients. To find out whether a food contains milk or a milk ingredient, look at the ingredients list. Avoid foods with the statement "May contain milk" and  foods that contain:  Butter.  Cream.  Milk.  Milk solids.  Milk powder.  Whey.  Curd.  Caseinate.  Lactose.  Lactalbumin.  Lactoglobulin. What are some alternatives to milk and foods made with milk products?  Lactose-free milk.  Soy milk with added calcium and vitamin D.  Almond, coconut, or rice milk with added calcium and vitamin D. Note that these are low in protein.  Soy products, such as soy yogurt, soy cheese, soy ice cream, and soy-based sour cream. Which foods can I eat? Grains  Breads and rolls made without milk, such as [place], [place], or [place] bread, bagels, pita, and [employer]. Corn tortillas, corn meal, grits, and polenta. Crackers without lactose or milk solids, such as soda crackers and graham crackers. Cooked or dry cereals without lactose or milk solids. Pasta, quinoa, couscous, barley, oats, bulgur, farro, rice, wild rice, or other grains prepared without milk or lactose. Plain popcorn. Vegetables  Fresh, frozen, and canned vegetables without cheese, cream, or butter sauces. Fruits  All fresh, canned, frozen, or dried fruits that are not processed with lactose. Meats and Other Protein Sources  Plain beef, chicken, fish, [place], lamb, veal, pork, wild game, or ham. Kosher-prepared meat products. Strained or junior meats that do not contain milk. Eggs. Soy meat substitutes. Beans, lentils, and hummus. Tofu. Nuts and seeds. Peanut or other nut butters without lactose. Soups, casseroles, and mixed dishes without cheese, cream, or milk. Dairy  Lactose-free milk. Soy, rice, or almond milk with added calcium and vitamin D. Soy cheese and yogurt. Beverages  Carbonated drinks. Tea. Coffee, freeze-dried coffee, and some instant coffees. Fruit and vegetable juices. Condiments  Soy sauce. Carob powder. Olives. Gravy made with water. Baker's cocoa. Angie Fava. Pure seasonings and spices. Ketchup. Mustard. Bouillon. Broth. Sweets and Desserts  Water and fruit ices.  Gelatin. Cookies, pies, or cakes made from allowed ingredients, such as angel food cake. Pudding made with water or a milk substitute. Lactose-free tofu desserts. Soy, coconut milk, or rice-milk-based frozen desserts. Sugar. Honey. Jam, jelly, and marmalade. Molasses. Pure sugar candy. Dark chocolate without milk. Marshmallows. Fats and Oils  Margarines and salad dressings that do not contain milk. Berniece Salines. Vegetable oils. Shortening. Mayonnaise. Soy or coconut-based cream. The items listed above may not be a complete list of recommended foods or beverages. Contact your dietitian for more options.  Which foods are not recommended? Grains  Breads and rolls that contain milk. Toaster pastries. Muffins, biscuits, waffles, cornbread, and pancakes. These can be prepared at home, commercial, or from mixes. Sweet rolls, donuts, English muffins, fry bread, lefse, flour tortillas with lactose, or [place] toast made with milk or milk ingredients. Crackers that contain lactose. Corn curls. Cooked or dry cereals with lactose. Vegetables  Creamed or breaded vegetables. Vegetables in a cheese or butter sauce or with lactose-containing margarines. Instant potatoes. [place] fries. Scalloped or au gratin potatoes. Fruits  None. Meats and Other Protein Sources  Scrambled eggs, omelets, and souffles that contain milk. Creamed or breaded meat, fish, chicken, or [place]. Sausage products, such as wieners and liver sausage. Cold cuts that contain milk solids. Cheese, cottage cheese, ricotta cheese, and cheese spreads. Lasagna and macaroni and cheese. Pizza. Peanut or other nut butters with added milk solids. Casseroles or mixed dishes containing milk or cheese. Dairy  All dairy products, including milk, goat's milk, buttermilk, kefir, acidophilus milk, flavored milk, evaporated milk, condensed milk, dulce [place] [employer], eggnog, yogurt, cheese, and cheese spreads. Beverages  Hot chocolate. Cocoa with lactose. Instant iced teas.  Powdered fruit drinks. Smoothies made with milk or yogurt. Condiments  Chewing gum that has lactose. Cocoa that has lactose. Spice blends if they contain milk products. Artificial sweeteners that contain lactose. Nondairy creamers. Sweets and Desserts  Ice cream, ice milk, gelato, sherbet, and frozen yogurt. Custard, pudding, and mousse. Cake, cream pies, cookies, and other desserts containing milk, cream, cream cheese, or milk chocolate. Pie crust made with milk-containing margarine or butter. Reduced-calorie desserts made with a sugar substitute that contains lactose. Toffee and butterscotch. Milk, white, or dark chocolate that contains milk. Fudge. Caramel. Fats and Oils  Margarines and salad dressings that contain milk or cheese. Cream. Half and half. Cream cheese. Sour cream. Chip dips made with sour cream or yogurt. The items listed above may not be a complete list of foods and beverages to avoid. Contact your dietitian for more information.  Am I getting enough calcium? Calcium is found in many foods that contain lactose and is important for bone health. The amount of calcium you need depends on your age:  Adults younger than 50 years: 1000 mg of calcium a day.  Adults older than 50 years: 1200 mg of calcium a day. If you are not getting enough calcium, other calcium sources include:  Orange juice with calcium added. There are 300-350 mg of calcium in 1 cup of orange juice.  Sardines with edible bones. There are 325 mg of calcium in 3 oz of sardines.  Calcium-fortified soy milk. There are 300-400 mg of calcium in 1 cup of calcium-fortified soy milk.  Calcium-fortified rice or almond milk. There are 300 mg of calcium in 1 cup of calcium-fortified rice or almond milk.  Canned salmon with edible bones. There are 180 mg of calcium in 3 oz of canned salmon with edible bones.  Calcium-fortified breakfast cereals. There are [phone] mg of calcium in calcium-fortified breakfast  cereals.  Tofu set with calcium sulfate. There are 250 mg of calcium in  cup of tofu set with calcium sulfate.  Spinach, cooked. There are 145 mg of calcium in  cup of cooked spinach.  Edamame, cooked. There are 130 mg of calcium in  cup of cooked edamame.  Collard greens, cooked. There are 125 mg of calcium in  cup of cooked collard greens.  Kale, frozen or cooked. There are 90 mg of calcium in  cup of cooked or frozen kale.  Almonds. There are 95 mg of calcium in  cup of almonds.  Broccoli, cooked. There are 60 mg of calcium in 1 cup of cooked broccoli. This information is not intended to replace advice given to you by your health care provider. Make sure you discuss any questions you have with your health care provider. Document Released: 06/26/2001 Document Revised: 06/12/2015 Document Reviewed: 04/06/2013  2017 Elsevier  We will refer you to Earlie Counts for pelvic floor therapy  Apply warm pack or Bengay as needed for shoulder and back pain

## 2016-01-28 NOTE — Telephone Encounter (Signed)
New Message   *STAT* If patient is at the pharmacy, call can be transferred to refill team.   1. Which medications need to be refilled? (please list name of each medication and dose if known) bisoprolol (Zebeta) 5 mg tablet once daily  2. Which pharmacy/location (including street and city if local pharmacy) is medication to be sent to? [facility], [place], [place] [address]  3. Do they need a 30 day or 90 day supply? 30 day supply

## 2016-01-28 NOTE — Telephone Encounter (Signed)
Refill authorization sent to pharmacy.
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
## 2016-01-30 ENCOUNTER — Ambulatory Visit: Payer: Medicare Other | Admitting: Nurse Practitioner

## 2016-01-30 ENCOUNTER — Telehealth: Payer: Self-pay | Admitting: Gastroenterology

## 2016-01-30 NOTE — Telephone Encounter (Signed)
States she took Naproxen this morning and her abdomen started swelling almost immediately. She feels bloated and her pants are tight. She cannot think of any other symptoms that are new. She wants to know if the Naproxen caused this.

## 2016-01-30 NOTE — Telephone Encounter (Signed)
Please advise her to stop taking Naproxen

## 2016-01-30 NOTE — Telephone Encounter (Signed)
Pt aware.
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
## 2016-02-02 ENCOUNTER — Ambulatory Visit (HOSPITAL_COMMUNITY)
Admission: RE | Admit: 2016-02-02 | Discharge: 2016-02-02 | Disposition: A | Discharge status: Home / Self Care | Payer: Medicare Other | Source: Ambulatory Visit | Attending: Gastroenterology | Admitting: Gastroenterology

## 2016-02-02 DIAGNOSIS — N281 Cyst of kidney, acquired: Secondary | ICD-10-CM | POA: Insufficient documentation

## 2016-02-02 DIAGNOSIS — I700 Atherosclerosis of aorta: Secondary | ICD-10-CM | POA: Diagnosis not present

## 2016-02-02 DIAGNOSIS — R1011 Right upper quadrant pain: Secondary | ICD-10-CM | POA: Insufficient documentation

## 2016-02-02 DIAGNOSIS — K589 Irritable bowel syndrome without diarrhea: Secondary | ICD-10-CM | POA: Diagnosis not present

## 2016-02-02 DIAGNOSIS — R159 Full incontinence of feces: Secondary | ICD-10-CM | POA: Insufficient documentation

## 2016-02-02 DIAGNOSIS — K8020 Calculus of gallbladder without cholecystitis without obstruction: Secondary | ICD-10-CM | POA: Diagnosis not present

## 2016-02-03 ENCOUNTER — Telehealth: Payer: Self-pay | Admitting: Gastroenterology

## 2016-02-03 NOTE — Telephone Encounter (Signed)
See result note.  

## 2016-02-05 ENCOUNTER — Ambulatory Visit: Payer: Medicare Other | Admitting: Cardiology

## 2016-02-06 ENCOUNTER — Ambulatory Visit: Payer: Medicare Other

## 2016-02-06 ENCOUNTER — Other Ambulatory Visit: Payer: Self-pay

## 2016-02-06 NOTE — Telephone Encounter (Signed)
[facility] Left v/m requesting refill for tramadol. Last refilled # 60 on 12/22/15. Last seen 06/26/15.

## 2016-02-09 ENCOUNTER — Encounter: Payer: Self-pay | Admitting: Physical Therapy

## 2016-02-09 ENCOUNTER — Ambulatory Visit: Payer: Medicare Other | Attending: Gastroenterology | Admitting: Physical Therapy

## 2016-02-09 DIAGNOSIS — R279 Unspecified lack of coordination: Secondary | ICD-10-CM | POA: Diagnosis not present

## 2016-02-09 DIAGNOSIS — M6281 Muscle weakness (generalized): Secondary | ICD-10-CM

## 2016-02-09 MED ORDER — TRAMADOL HCL 50 MG PO TABS: 50.0000 mg | ORAL_TABLET | Freq: Two times a day (BID) | ORAL | 0 refills | Status: DC | PRN

## 2016-02-09 NOTE — Telephone Encounter (Signed)
Rx called in as directed.   

## 2016-02-09 NOTE — Telephone Encounter (Signed)
Pt called to ck on status of tramadol at [facility]. I spoke with Santiago Glad at [facility] and tramadol is being processed now. Should be ready for pick up in approx 30 mins. Pt voiced understanding.

## 2016-02-09 NOTE — Patient Instructions (Addendum)
Quick Contraction: Gravity Eliminated (Side-Lying)    Lie on left side, hips and knees slightly bent. Quickly squeeze then fully relax pelvic floor. Perform __1_ sets of _5__. Rest for _1__ seconds between sets. Do __4_ times a day.   Copyright  VHI. All rights reserved.  Slow Contraction: Gravity Eliminated (Side-Lying)    Lie on left side, hips and knees slightly bent. Slowly squeeze pelvic floor for _5__ seconds. Rest for _5__ seconds. Repeat 10___ times. Do _4__ times a day.   Copyright  VHI. All rights reserved.  [facility] [address] [place],  [address] Phone # [phone] Fax [phone]

## 2016-02-09 NOTE — Telephone Encounter (Signed)
plz phone in. 

## 2016-02-09 NOTE — Therapy (Signed)
[facility] [address], [place], [place], [address] Phone: [phone]   Fax:  [phone]  Physical Therapy Evaluation  Patient Details  Name: Nicole English MRN: [id number] Date of Birth: 1932-05-31 Referring Provider: Dr. Silverio Decamp  Encounter Date: 02/09/2016      PT End of Session - 02/09/16 1219    Visit Number 1   Number of Visits 10   Date for PT Re-Evaluation 04/05/16   PT Start Time 6433   PT Stop Time 1225   PT Time Calculation (min) 40 min   Activity Tolerance Patient tolerated treatment well   Behavior During Therapy [facility] for tasks assessed/performed      Past Medical History:  Diagnosis Date  . Abdominal aortic atherosclerosis ([place]) 07/2015   by CT  . Abnormal drug screen 12/2013   abnormally neg xanax (12/2013) and neg xanax and positive EtOH (04/2014), appropriate (08/2014)  . Allergic rhinitis   . Anxiety   . CKD (chronic kidney disease) stage 4, GFR 15-29 ml/min (HCC) 12/2012   per pt after brown recluse bite. sees Dr Juleen China, off ACEI, bp ok slightly elevated to maintain renal perfusion  . 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 DDD (degenerative disc disease), lumbar   . Depression   . Diastolic dysfunction 2951   per echo in 11/2010 and again 03/2012; normal EF  . Diverticulosis    colonsocopy 2002 aborted 2/2 tortuous colon and heavy sigmoid diverticulosis  . Dysphagia    EGD 2012 - wnl, s/p esoph dilation  . GERD (gastroesophageal reflux disease)   . Glucose intolerance (impaired glucose tolerance)   . History of CVA (cerebrovascular accident) 2012   old per prior head CT  . History of diverticulitis of colon   . HLD (hyperlipidemia)   . HTN (hypertension)    Dr. Martinique, cards  . IBS (irritable bowel syndrome)   . Migraine   . Osteoporosis 08/2013   Spine -2.2, hip -3.5  . Rib fractures 10/11/2014  .  Right upper lobe pneumonia ([place]) 03/25/2014  . SUI (stress urinary incontinence, female)    Dr. Amalia Hailey, urology (some urge as well)    Past Surgical History:  Procedure Laterality Date  . BREAST ENHANCEMENT SURGERY  1982  . COLONOSCOPY  2002   does not want another! severe diverticulosis with luminal narrowing and spasm Deatra Ina)  . CT HEAD LIMITED W/O CM  04/2010   nothing acute, ? old infarcts L internal capsule  . DEXA  08/2013   Spine -2.2, hip -3.5  . ESOPHAGOGASTRODUODENOSCOPY  2012   WNL, s/p esoph dilation (Dr. Candace Cruise, [facility])  . macroplastique implantation  03/2008   Dr. Rogers Blocker  . renal doppler  2009?   simple cysts, wnl  . submucous resection  1962  . TONSILLECTOMY  1942  . TOTAL ABDOMINAL HYSTERECTOMY  1996   for frequent dysmennorhea  . TUBAL LIGATION  1973  . uretherolysis and removal of macroplastique  09/13/08   Dr. Amalia Hailey  . US ECHOCARDIOGRAPHY  11/2010   EF 55-60%, mild LAE, mild diastolic dysfunction, normal valves    There were no vitals filed for this visit.       Subjective Assessment - 02/09/16 1152    Subjective Patient reports no control of her bowels all of the time. this happens daily. Patient reports she has loose bowels. Patient uses tissues and a pull up panty.  Patient has bladder leakage. Changes pads 2 times per day. No urinary leakage at night.    Patient Stated Goals reduce the leakage   Currently in Pain? Yes   Pain Score 4    Pain Location Coccyx   Pain Orientation Mid   Pain Descriptors / Indicators Sharp   Pain Type Acute pain   Pain Radiating Towards none   Pain Onset In the past 7 days   Pain Frequency Intermittent   Aggravating Factors  not sure   Pain Relieving Factors not sure   Multiple Pain Sites No            [facility] PT Assessment - 02/09/16 0001      Assessment   Medical Diagnosis R15.9 Incontinence of feces, unspecified fecal incontince type   Referring Provider Dr. Silverio Decamp   Onset Date/Surgical Date 09/19/15   Prior  Therapy None     Precautions   Precautions None     Restrictions   Weight Bearing Restrictions No     Balance Screen   Has the patient fallen in the past 6 months No   Has the patient had a decrease in activity level because of a fear of falling?  No   Is the patient reluctant to leave their home because of a fear of falling?  No     Prior Function   Level of Independence Independent   Vocation Retired     Associate Professor   Overall Cognitive Status Within Functional Limits for tasks assessed     Observation/Other Assessments   Focus on Therapeutic Outcomes (FOTO)  42% limitation  goal is 33% limitation     Posture/Postural Control   Posture/Postural Control No significant limitations     ROM / Strength   AROM / PROM / Strength Strength     Strength   Overall Strength Comments bilateral hip, knee, and ankle  strength 4/5; bil. hip abduction 3/5     Palpation   SI assessment  pelvis in correct alignment   Palpation comment tenderness located in bilateral upper hip adductors                 Pelvic Floor Special Questions - 02/09/16 0001    Currently Sexually Active No   Urinary Leakage Yes   Pad use 2  tissues and pullup underwear   Activities that cause leaking With strong urge;Coughing;Sneezing;Bending   Urinary urgency No   Fecal incontinence Yes  pad had fecal matter on it   External Perineal Exam skin was red   Pelvic Floor Internal Exam Patient confirms identification and approves PT to assess pelvic floor strength   Exam Type Rectal   Strength weak squeeze, no lift   Tone low tone                  PT Education - 02/09/16 1306    Education provided Yes   Education Details pelvic floor contraction   Person(s) Educated Patient   Methods Explanation;Demonstration;Handout   Comprehension Verbalized understanding;Returned demonstration          PT Short Term Goals - 02/09/16 1228      PT SHORT TERM GOAL #1   Title independent with  initial HEP   Time 4   Period Weeks   Status New     PT SHORT TERM GOAL #2   Title fecal leakage decreased >/= 30%   Time 4   Period Weeks   Status New     PT SHORT TERM  GOAL #3   Title urinary leakage decreased >/= 30%   Time 4   Period Weeks   Status New           PT Long Term Goals - 03/06/2016 1223      PT LONG TERM GOAL #1   Title independent with HEP   Time 8   Period Weeks   Status New     PT LONG TERM GOAL #2   Title reduce fecal leakge >/= 80% due to increased pelvic floor strength   Time 8   Period Weeks   Status New     PT LONG TERM GOAL #3   Title reduce urinary leakage >/= 80% due to increased strength of pelvic floor   Time 8   Period Weeks   Status New     PT LONG TERM GOAL #4   Title wear a light day pad instead of a pull up with toilet paper due to a reduction in leakage   Time 8   Period Weeks   Status New     PT LONG TERM GOAL #5   Title FOTO score </= 33% limitation   Time 8   Period Weeks   Status New               Plan - Mar 06, 2016 1308    Clinical Impression Statement Patient is a 83 year old female with diagnosis of fecal incontinence that began suddenly several months ago. Patient reports intermittent pain in coccyx area that began 1 week ago at level 4/10. Patient wears 2 pullups per day with toilet paper due to fecal and urinary leakage.  When therapist assessed the perineal area there was fecal matter on the anal area. Pelvic floor strength is 2/5.  Abdominal strength is 1/5.  Bilateral hip abduction is 3/5 and rest of lower extremity is 4/5.  Patient is a moderate complex evaluation due to evolving condition and comorbidities such as IBS, history of TBI, and osteoporosis that could impact care provided.    Rehab Potential Excellent   Clinical Impairments Affecting Rehab Potential Osteoporosis; History of TBI; IBS   PT Frequency 1x / week   PT Duration 8 weeks   PT Treatment/Interventions Biofeedback;Therapeutic  activities;Ultrasound;Moist Heat;Therapeutic exercise;Neuromuscular re-education;Patient/family education;Manual techniques;Dry needling   PT Next Visit Plan abdominal strength; sitting posture; hip strength; pelvic floor exercise   PT Home Exercise Plan progress as needed   Recommended Other Services None   Consulted and Agree with Plan of Care Patient      Patient will benefit from skilled therapeutic intervention in order to improve the following deficits and impairments:  Decreased coordination, Pain, Decreased strength, Decreased activity tolerance  Visit Diagnosis: Muscle weakness (generalized)  Unspecified lack of coordination      G-Codes - 03/06/2016 1227    Functional Assessment Tool Used FOTO score is 42% limitation  goal is 33% limitation   Functional Limitation Other PT primary   Other PT Primary Current Status (N3976) At least 40 percent but less than 60 percent impaired, limited or restricted   Other PT Primary Goal Status (B3419) At least 20 percent but less than 40 percent impaired, limited or restricted       Problem List Patient Active Problem List   Diagnosis Date Noted  . Abdominal aortic atherosclerosis ([place]) 07/19/2015  . Stool incontinence 06/26/2015  . Urinary incontinence 05/21/2015  . Rib pain on right side 05/21/2015  . Advanced care planning/counseling discussion 03/07/2015  . Paresthesias 09/04/2014  .  Alcohol use 09/04/2014  . Ruptured silicone breast implant 05/02/2014  . Abnormal drug screen 04/19/2014  . Diarrhea 12/12/2013  . Hip pain, bilateral 06/08/2013  . Chest pain 03/26/2013  . DOE (dyspnea on exertion) 10/27/2012  . Bradycardia 08/18/2012  . Syncope and collapse 04/06/2012  . Chronic back pain 03/29/2012  . COPD GOLD III 10/26/2011  . Diastolic dysfunction   . Medicare annual wellness visit, subsequent 12/09/2010  . Dysphagia 07/10/2010  . History of diverticulitis of colon   . HLD (hyperlipidemia)   . Anxiety   . MDD (major  depressive disorder) ([place])   . GERD (gastroesophageal reflux disease)   . IBS (irritable bowel syndrome)   . Allergic rhinitis   . Migraine   . HTN (hypertension)   . SUI (stress urinary incontinence, female)   . CKD (chronic kidney disease) stage 4, GFR 15-29 ml/min (HCC) 12/01/2009  . Vitamin D deficiency 08/21/2008  . Anemia in chronic kidney disease 08/21/2008  . Osteoporosis 05/19/2007  . Insomnia 05/19/2007    Earlie Counts, PT 02/09/16 1:16 PM   [facility] [address], [place], [place], [address] Phone: [phone]   Fax:  [phone]  Name: Nicole English MRN: [id number] Date of Birth: 01-Jun-1932

## 2016-02-10 ENCOUNTER — Ambulatory Visit: Payer: Medicare Other

## 2016-02-16 ENCOUNTER — Encounter: Payer: Self-pay | Admitting: Physical Therapy

## 2016-02-16 ENCOUNTER — Ambulatory Visit: Payer: Medicare Other | Admitting: Physical Therapy

## 2016-02-16 DIAGNOSIS — R279 Unspecified lack of coordination: Secondary | ICD-10-CM | POA: Diagnosis not present

## 2016-02-16 DIAGNOSIS — M6281 Muscle weakness (generalized): Secondary | ICD-10-CM | POA: Diagnosis not present

## 2016-02-16 NOTE — Patient Instructions (Addendum)
Posture - Sitting    Sit upright, head facing forward. Try using a roll to support lower back. Keep shoulders relaxed, and avoid rounded back. Keep hips level with knees. Avoid crossing legs for long periods.   Copyright  VHI. All rights reserved.   Slow Contraction: Gravity Resisted (Sitting)    Sitting, slowly squeeze pelvic floor for _1__ seconds. Rest for _1__ seconds. Repeat _5__ times. Do __4_ times a day.  Copyright  VHI. All rights reserved.  Slow Contraction: Gravity Resisted (Sitting)    Sitting, slowly squeeze pelvic floor for _5__ seconds counting out loud. Rest for _5__ seconds. Repeat 5___ times. Do __4_ times a day.  Copyright  VHI. All rights reserved.    Introduction to Bowel Health Diet and daily habits can help you predict when your bowels will move on a regular basis.  The consistency and quantity of the stool is usually more important than the frequency.  The goal is to have a regular bowel movement that is soft but formed.   Tips on Emptying Regularly . Eat breakfast.  Usually the best time of day for a bowel movement will be a half hour to an hour after eating.  These times are best because the body uses the gastrocolic reflex, a stimulation of bowel motion that occurs with eating, to help produce a bowel movement.  For some people even a simple hot drink in the morning can help the reflex action begin. . Eat all your meals at a predictable time each day.  The bowel functions best when food is introduced at the same regular intervals. . The amount of food eaten at a given time of day should be about the same size from day to day.  The bowel functions best when food is introduced in similar quantities from day to day. It is fine to have a small breakfast and a large lunch, or vice versa, just be consistent. . Eat two servings of fruit or vegetables and at least one serving of a complex carbohydrates (whole grains such as brown rice, bran, whole wheat bread,  or oatmeal) at each meal. . Drink plenty of water-ideally eight glasses a day.  Be sure to increase your water intake if you are increasing fiber into your diet.  Maintain Healthy Habits . Exercise daily.  You may exercise at any time of day, but you may find that bowel function is helped most if the exercise is at a consistent time each day. . Make sure that you are not rushed and have convenient access to a bathroom at your selected time to empty your bowels.       Ways to get started on an exercise program 1.  Start for 10 minutes per day with a walking program. 2. Work towards 30 minutes of exercise per day 3. When you do an aerobic exercise program start on a low level 4. Water aerobics is a good place due to decreased strain on your joints 5. Begin your exercise program gradually and progress slowly over time 6. When exercising use correct form. a. Keep neutral spine b. Engage abdominals c. Keep chest up  d. Chin down e. Do not lock your knees  Earlie Counts, PT [facility], [place], [place] [address] [phone] Tips  Walking is the single best weight bearing exercise for most people most of the time. All that is needed is a good pair of walking shoes and a safe place to  walk. GUIDELINES: 1.Shoes should be supportive and large enough to easily accommodate your feet in both length and width. 2.To get more out of your walking, practice walking exercises. 3.Establish a regular walking habit. 4.Maintain good upper body alignment when walking. START WITH: _5-10__ minutes _daily WORK UP TO: _15-20__ minutes daily CONSIDER the use of a heart rate monitor and/or pedometer to add purpose, goals, and variability to your walking workout.  Copyright  VHI. All rights reserved.   Breathe!    Actively breathe during walking. Count how many steps you can take on a breath in, then on a breath out. Increase the depth of the breath  by taking more steps per breath.   Copyright  VHI. All rights reserved.  [employer], feet hip-width apart, on [employer]. Even weight between feet. Even weight between ball and heel of each foot. Press feet into floor. Feel postural muscles contract. Practice frequently during the day.  Copyright  VHI. All rights reserved.    [facility] [address], [place], [place] [address] Phone # [phone] Fax [phone]  Shoulder Press    Press both shoulders down. Hold _5__ seconds. Repeat _5__ times.  Do other shoulder. If unable to press one or both shoulders, lie in position a few sessions until you can. Surface: bed  Copyright  VHI. All rights reserved.  Head Press With [place] chin SLIGHTLY toward chest, keep mouth closed. Feel weight on back of head. Increase weight by pressing head down. Hold _5__ seconds. Relax. Repeat _5__ times. Surface: bed  Copyright  VHI. All rights reserved.  [facility] [address], [place], [place] [address] Phone # [phone] Fax [phone]

## 2016-02-16 NOTE — Therapy (Signed)
[facility] [address] [place], [place], [address] Phone: [phone]   Fax:  [phone]  Physical Therapy Treatment  Patient Details  Name: Nicole English MRN: [id number] Date of Birth: 25-Jun-1932 Referring Provider: Dr. Silverio Decamp  Encounter Date: 02/16/2016      PT End of Session - 02/16/16 1448    Visit Number 2   Number of Visits 10   Date for PT Re-Evaluation 04/05/16   PT Start Time 5366   PT Stop Time 1446   PT Time Calculation (min) 38 min   Activity Tolerance Patient tolerated treatment well   Behavior During Therapy [facility] for tasks assessed/performed      Past Medical History:  Diagnosis Date  . Abdominal aortic atherosclerosis ([place]) 07/2015   by CT  . Abnormal drug screen 12/2013   abnormally neg xanax (12/2013) and neg xanax and positive EtOH (04/2014), appropriate (08/2014)  . Allergic rhinitis   . Anxiety   . CKD (chronic kidney disease) stage 4, GFR 15-29 ml/min (HCC) 12/2012   per pt after brown recluse bite. sees Dr Juleen China, off ACEI, bp ok slightly elevated to maintain renal perfusion  . 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 DDD (degenerative disc disease), lumbar   . Depression   . Diastolic dysfunction 4403   per echo in 11/2010 and again 03/2012; normal EF  . Diverticulosis    colonsocopy 2002 aborted 2/2 tortuous colon and heavy sigmoid diverticulosis  . Dysphagia    EGD 2012 - wnl, s/p esoph dilation  . GERD (gastroesophageal reflux disease)   . Glucose intolerance (impaired glucose tolerance)   . History of CVA (cerebrovascular accident) 2012   old per prior head CT  . History of diverticulitis of colon   . HLD (hyperlipidemia)   . HTN (hypertension)    Dr. Martinique, cards  . IBS (irritable bowel syndrome)   . Migraine   . Osteoporosis 08/2013   Spine -2.2, hip -3.5  . Rib fractures 10/11/2014  . Right  upper lobe pneumonia ([place]) 03/25/2014  . SUI (stress urinary incontinence, female)    Dr. Amalia Hailey, urology (some urge as well)    Past Surgical History:  Procedure Laterality Date  . BREAST ENHANCEMENT SURGERY  1982  . COLONOSCOPY  2002   does not want another! severe diverticulosis with luminal narrowing and spasm Deatra Ina)  . CT HEAD LIMITED W/O CM  04/2010   nothing acute, ? old infarcts L internal capsule  . DEXA  08/2013   Spine -2.2, hip -3.5  . ESOPHAGOGASTRODUODENOSCOPY  2012   WNL, s/p esoph dilation (Dr. Candace Cruise, [facility])  . macroplastique implantation  03/2008   Dr. [place] Blocker  . renal doppler  2009?   simple cysts, wnl  . submucous resection  1962  . TONSILLECTOMY  1942  . TOTAL ABDOMINAL HYSTERECTOMY  1996   for frequent dysmennorhea  . TUBAL LIGATION  1973  . uretherolysis and removal of macroplastique  09/13/08   Dr. Amalia Hailey  . US ECHOCARDIOGRAPHY  11/2010   EF 55-60%, mild LAE, mild diastolic dysfunction, normal valves    There were no vitals filed for this visit.      Subjective Assessment - 02/16/16 1410    Subjective I felt fine after last visit.   Patient Stated Goals reduce the leakage   Currently in Pain? No/denies  [facility] Adult PT Treatment/Exercise - 02/16/16 0001      Posture/Postural Control   Posture Comments Instruction on correct posture in sitting to make the pelvic floor muscles contract correctly     Lumbar Exercises: Supine   Ab Set 10 reps;5 seconds                PT Education - 02/16/16 1445    Education provided Yes   Education Details pelvic floor contraction in sitting; posture in sitting and standing; postural strength; bowel health   Person(s) Educated Patient   Methods Explanation;Demonstration;Verbal cues;Handout   Comprehension Returned demonstration;Verbalized understanding          PT Short Term Goals - 02/16/16 1451      PT SHORT TERM GOAL #1   Title independent with initial HEP    Time 4   Period Weeks   Status On-going     PT SHORT TERM GOAL #2   Title fecal leakage decreased >/= 30%   Time 4   Period Weeks   Status On-going     PT SHORT TERM GOAL #3   Title urinary leakage decreased >/= 30%   Time 4   Period Weeks   Status New           PT Long Term Goals - 02/09/16 1223      PT LONG TERM GOAL #1   Title independent with HEP   Time 8   Period Weeks   Status New     PT LONG TERM GOAL #2   Title reduce fecal leakge >/= 80% due to increased pelvic floor strength   Time 8   Period Weeks   Status New     PT LONG TERM GOAL #3   Title reduce urinary leakage >/= 80% due to increased strength of pelvic floor   Time 8   Period Weeks   Status New     PT LONG TERM GOAL #4   Title wear a light day pad instead of a pull up with toilet paper due to a reduction in leakage   Time 8   Period Weeks   Status New     PT LONG TERM GOAL #5   Title FOTO score </= 33% limitation   Time 8   Period Weeks   Status New               Plan - 02/16/16 1449    Clinical Impression Statement Patient able to perform a pelvic floor exercises  correctly without using her buttocks.  Patient has just started therapy so she has not met goals yet.  Patient needs verbal cues on posture and postural strengthening.  Patient will benefit from PT to increased strength and improve posture.    Rehab Potential Excellent   Clinical Impairments Affecting Rehab Potential Osteoporosis; History of TBI; IBS   PT Frequency 1x / week   PT Duration 8 weeks   PT Treatment/Interventions Biofeedback;Therapeutic activities;Ultrasound;Moist Heat;Therapeutic exercise;Neuromuscular re-education;Patient/family education;Manual techniques;Dry needling   PT Next Visit Plan abdominal strength;  hip strength; pelvic floor exercise; bulking up bowels   PT Home Exercise Plan progress as needed   Consulted and Agree with Plan of Care Patient      Patient will benefit from skilled  therapeutic intervention in order to improve the following deficits and impairments:  Decreased coordination, Pain, Decreased strength, Decreased activity tolerance  Visit Diagnosis: Muscle weakness (generalized)  Unspecified lack of coordination     Problem List Patient Active  Problem List   Diagnosis Date Noted  . Abdominal aortic atherosclerosis ([place]) 07/19/2015  . Stool incontinence 06/26/2015  . Urinary incontinence 05/21/2015  . Rib pain on right side 05/21/2015  . Advanced care planning/counseling discussion 03/07/2015  . Paresthesias 09/04/2014  . Alcohol use 09/04/2014  . Ruptured silicone breast implant 05/02/2014  . Abnormal drug screen 04/19/2014  . Diarrhea 12/12/2013  . Hip pain, bilateral 06/08/2013  . Chest pain 03/26/2013  . DOE (dyspnea on exertion) 10/27/2012  . Bradycardia 08/18/2012  . Syncope and collapse 04/06/2012  . Chronic back pain 03/29/2012  . COPD GOLD III 10/26/2011  . Diastolic dysfunction   . Medicare annual wellness visit, subsequent 12/09/2010  . Dysphagia 07/10/2010  . History of diverticulitis of colon   . HLD (hyperlipidemia)   . Anxiety   . MDD (major depressive disorder) ([place])   . GERD (gastroesophageal reflux disease)   . IBS (irritable bowel syndrome)   . Allergic rhinitis   . Migraine   . HTN (hypertension)   . SUI (stress urinary incontinence, female)   . CKD (chronic kidney disease) stage 4, GFR 15-29 ml/min (HCC) 12/01/2009  . Vitamin D deficiency 08/21/2008  . Anemia in chronic kidney disease 08/21/2008  . Osteoporosis 05/19/2007  . Insomnia 05/19/2007   Earlie Counts, PT 02/16/16 2:53 PM   [facility] [address] [place], [place], [address] Phone: [phone]   Fax:  [phone]  Name: Nicole English MRN: [id number] Date of Birth: 1932/05/14

## 2016-02-20 ENCOUNTER — Other Ambulatory Visit: Payer: Self-pay | Admitting: *Deleted

## 2016-02-20 MED ORDER — BISOPROLOL FUMARATE 5 MG PO TABS: 5.0000 mg | ORAL_TABLET | Freq: Every day | ORAL | 0 refills | Status: DC

## 2016-02-23 ENCOUNTER — Encounter: Payer: Medicare Other | Admitting: Physical Therapy

## 2016-03-01 ENCOUNTER — Ambulatory Visit: Payer: Medicare Other | Attending: Gastroenterology | Admitting: Physical Therapy

## 2016-03-01 ENCOUNTER — Encounter: Payer: Self-pay | Admitting: Physical Therapy

## 2016-03-01 DIAGNOSIS — M6281 Muscle weakness (generalized): Secondary | ICD-10-CM | POA: Diagnosis not present

## 2016-03-01 DIAGNOSIS — R279 Unspecified lack of coordination: Secondary | ICD-10-CM | POA: Diagnosis not present

## 2016-03-01 NOTE — Therapy (Signed)
[facility] [address], [place], [place], [address] Phone: [phone]   Fax:  [phone]  Physical Therapy Treatment  Patient Details  Name: Nicole English MRN: [id number] Date of Birth: Jul 21, 1932 Referring Provider: Dr. Silverio Decamp  Encounter Date: 03/01/2016      PT End of Session - 03/01/16 1442    Visit Number 3   Number of Visits 10   Date for PT Re-Evaluation 04/05/16   PT Start Time 1400   PT Stop Time 1442   PT Time Calculation (min) 42 min   Activity Tolerance Patient tolerated treatment well   Behavior During Therapy [facility] for tasks assessed/performed      Past Medical History:  Diagnosis Date  . Abdominal aortic atherosclerosis ([place]) 07/2015   by CT  . Abnormal drug screen 12/2013   abnormally neg xanax (12/2013) and neg xanax and positive EtOH (04/2014), appropriate (08/2014)  . Allergic rhinitis   . Anxiety   . CKD (chronic kidney disease) stage 4, GFR 15-29 ml/min (HCC) 12/2012   per pt after brown recluse bite. sees Dr Juleen China, off ACEI, bp ok slightly elevated to maintain renal perfusion  . 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 DDD (degenerative disc disease), lumbar   . Depression   . Diastolic dysfunction 6010   per echo in 11/2010 and again 03/2012; normal EF  . Diverticulosis    colonsocopy 2002 aborted 2/2 tortuous colon and heavy sigmoid diverticulosis  . Dysphagia    EGD 2012 - wnl, s/p esoph dilation  . GERD (gastroesophageal reflux disease)   . Glucose intolerance (impaired glucose tolerance)   . History of CVA (cerebrovascular accident) 2012   old per prior head CT  . History of diverticulitis of colon   . HLD (hyperlipidemia)   . HTN (hypertension)    Dr. Martinique, cards  . IBS (irritable bowel syndrome)   . Migraine   . Osteoporosis 08/2013   Spine -2.2, hip -3.5  . Rib fractures 10/11/2014  . Right  upper lobe pneumonia ([place]) 03/25/2014  . SUI (stress urinary incontinence, female)    Dr. Amalia Hailey, urology (some urge as well)    Past Surgical History:  Procedure Laterality Date  . BREAST ENHANCEMENT SURGERY  1982  . COLONOSCOPY  2002   does not want another! severe diverticulosis with luminal narrowing and spasm Deatra Ina)  . CT HEAD LIMITED W/O CM  04/2010   nothing acute, ? old infarcts L internal capsule  . DEXA  08/2013   Spine -2.2, hip -3.5  . ESOPHAGOGASTRODUODENOSCOPY  2012   WNL, s/p esoph dilation (Dr. Candace Cruise, [facility]ef[facility])  . macroplastique implantation  03/2008   Dr. [place] Blocker  . renal doppler  2009?   simple cysts, wnl  . submucous resection  1962  . TONSILLECTOMY  1942  . TOTAL ABDOMINAL HYSTERECTOMY  1996   for frequent dysmennorhea  . TUBAL LIGATION  1973  . uretherolysis and removal of macroplastique  09/13/08   Dr. Amalia Hailey  . US ECHOCARDIOGRAPHY  11/2010   EF 55-60%, mild LAE, mild diastolic dysfunction, normal valves    There were no vitals filed for this visit.      Subjective Assessment - 03/01/16 1404    Subjective My fecal leakage is better. I leak one time per day. I have been in bed during the weekend due to being sick.    Patient Stated Goals reduce  the leakage   Currently in Pain? Yes   Pain Score 6    Pain Location Rectum   Pain Orientation Mid   Pain Descriptors / Indicators Aching   Pain Type Acute pain   Pain Onset More than a month ago   Pain Frequency Intermittent   Aggravating Factors  Not sure   Pain Relieving Factors not sure   Multiple Pain Sites No                         [facility] Adult PT Treatment/Exercise - 03/01/16 0001      Knee/Hip Exercises: Standing   Hip Abduction Stengthening;Right;Left;10 reps;Knee straight;3 sets  1#   Abduction Limitations hands on mat   Hip Extension Stengthening;Right;Left;5 reps;3 sets  1#                PT Education - 03/01/16 1430    Education provided Yes   Education Details  pelvic floor contraction with exercise in bed.    Person(s) Educated Patient   Methods Explanation;Demonstration;Verbal cues;Handout   Comprehension Returned demonstration;Verbalized understanding          PT Short Term Goals - 03/01/16 1406      PT SHORT TERM GOAL #1   Title independent with initial HEP   Time 4   Period Weeks   Status Achieved     PT SHORT TERM GOAL #2   Title fecal leakage decreased >/= 30%   Time 4   Period Weeks   Status Achieved     PT SHORT TERM GOAL #3   Title urinary leakage decreased >/= 30%   Time 4   Period Weeks   Status Achieved           PT Long Term Goals - 02/09/16 1223      PT LONG TERM GOAL #1   Title independent with HEP   Time 8   Period Weeks   Status New     PT LONG TERM GOAL #2   Title reduce fecal leakge >/= 80% due to increased pelvic floor strength   Time 8   Period Weeks   Status New     PT LONG TERM GOAL #3   Title reduce urinary leakage >/= 80% due to increased strength of pelvic floor   Time 8   Period Weeks   Status New     PT LONG TERM GOAL #4   Title wear a light day pad instead of a pull up with toilet paper due to a reduction in leakage   Time 8   Period Weeks   Status New     PT LONG TERM GOAL #5   Title FOTO score </= 33% limitation   Time 8   Period Weeks   Status New               Plan - 03/01/16 1442    Clinical Impression Statement Patient reports decreased in fecal leakage.  It may happen 1 time per day.  Patient reports she rarely has urinary leakage.  Patient was in bed during the weekend due to not feeling well so therapist gave her exercises that she can do in bed.  Patient is able to contract her abdominals correctly.  Patient will benefit from Pt to increase strength and improve posture.    Rehab Potential Excellent   Clinical Impairments Affecting Rehab Potential Osteoporosis; History of TBI; IBS   PT Frequency 1x / week  PT Duration 8 weeks   PT Treatment/Interventions  Biofeedback;Therapeutic activities;Ultrasound;Moist Heat;Therapeutic exercise;Neuromuscular re-education;Patient/family education;Manual techniques;Dry needling   PT Next Visit Plan  hip strength; pelvic floor exercise; bulking up bowels   PT Home Exercise Plan progress as needed   Consulted and Agree with Plan of Care Patient      Patient will benefit from skilled therapeutic intervention in order to improve the following deficits and impairments:  Decreased coordination, Pain, Decreased strength, Decreased activity tolerance  Visit Diagnosis: Muscle weakness (generalized)  Unspecified lack of coordination     Problem List Patient Active Problem List   Diagnosis Date Noted  . Abdominal aortic atherosclerosis ([place]) 07/19/2015  . Stool incontinence 06/26/2015  . Urinary incontinence 05/21/2015  . Rib pain on right side 05/21/2015  . Advanced care planning/counseling discussion 03/07/2015  . Paresthesias 09/04/2014  . Alcohol use 09/04/2014  . Ruptured silicone breast implant 05/02/2014  . Abnormal drug screen 04/19/2014  . Diarrhea 12/12/2013  . Hip pain, bilateral 06/08/2013  . Chest pain 03/26/2013  . DOE (dyspnea on exertion) 10/27/2012  . Bradycardia 08/18/2012  . Syncope and collapse 04/06/2012  . Chronic back pain 03/29/2012  . COPD GOLD III 10/26/2011  . Diastolic dysfunction   . Medicare annual wellness visit, subsequent 12/09/2010  . Dysphagia 07/10/2010  . History of diverticulitis of colon   . HLD (hyperlipidemia)   . Anxiety   . MDD (major depressive disorder) ([place])   . GERD (gastroesophageal reflux disease)   . IBS (irritable bowel syndrome)   . Allergic rhinitis   . Migraine   . HTN (hypertension)   . SUI (stress urinary incontinence, female)   . CKD (chronic kidney disease) stage 4, GFR 15-29 ml/min (HCC) 12/01/2009  . Vitamin D deficiency 08/21/2008  . Anemia in chronic kidney disease 08/21/2008  . Osteoporosis 05/19/2007  . Insomnia 05/19/2007     Earlie Counts, PT 03/01/16 2:45 PM   [facility] [address], [place], [place], [address] Phone: [phone]   Fax:  [phone]  Name: Nicole English MRN: [id number] Date of Birth: May 17, 1932

## 2016-03-01 NOTE — Patient Instructions (Addendum)
Bracing With Bridging (Hook-Lying)    With neutral spine, tighten pelvic floor and abdominals and hold. Lift bottom. Repeat _10__ times. Do __1_ times a day.   Copyright  VHI. All rights reserved.    Abdominal Bracing With Pelvic Floor (Hook-Lying)    With neutral spine, tighten pelvic floor and abdominals.hold 5 sec . Repeat _10__ times. Do _1__ times a day.   Copyright  VHI. All rights reserved.    Bracing With Knee Fallout (Hook-Lying)    With neutral spine, tighten pelvic floor and abdominals and hold. Alternating legs, drop knee out to side. Keep opposite hip still. Repeat _10__ times. Do _1__ times a day.   Copyright  VHI. All rights reserved.  Bracing With Arms / Legs (Hook-Lying)    With neutral spine, tighten pelvic floor and abdominals and hold. Raise arm and opposite leg, then return. Repeat wtih other limbs. Repeat _10__ times. Do _1__ times a day.   Copyright  VHI. All rights reserved.  Sit to Stand: Phase 3    Sitting, squeeze pelvic floor and hold. Lean trunk forward. Push up on arms and stand up. Relax. Repeat 10___ times. Do __1_ times a day. When sitting pretend you are going to sit on an egg so you come down slowly.   Copyright  VHI. All rights reserved.  [facility] [address] [place], [place] [address] Phone # [phone] Fax [phone]

## 2016-03-06 ENCOUNTER — Other Ambulatory Visit: Payer: Self-pay | Admitting: Family Medicine

## 2016-03-06 DIAGNOSIS — D631 Anemia in chronic kidney disease: Secondary | ICD-10-CM

## 2016-03-06 DIAGNOSIS — E559 Vitamin D deficiency, unspecified: Secondary | ICD-10-CM

## 2016-03-06 DIAGNOSIS — M810 Age-related osteoporosis without current pathological fracture: Secondary | ICD-10-CM

## 2016-03-06 DIAGNOSIS — N184 Chronic kidney disease, stage 4 (severe): Secondary | ICD-10-CM

## 2016-03-06 DIAGNOSIS — E785 Hyperlipidemia, unspecified: Secondary | ICD-10-CM

## 2016-03-07 NOTE — Progress Notes (Signed)
[name] Date of Birth: 01-Mar-1932 Medical Record #[id number]  History of Present Illness: Nicole English is seen  for followup of HTN. She has a history of HTN, diastolic dysfunction, CKD, HLD, COPD - with severe airway obstruction on spirometry noted back in January of 2014, GERD, anxiety, depression, diverticulosis and CKD. Intolerant to ACE/ARB due to renal function. Intolerant to Norvasc due to edema. HCTZ has affected her renal indices. Intolerant to higher doses of hydralazine due to swelling and headache. Last Myoview at [facility] 04/2010 with no ischemia and EF of 90%. Echo 03/2012 with EF 60 to 56%, diastolic dysfunction, MAC. Has bilateral carotid disease of 0 to 39% per doppler in 04/2012. Negative event monitor in 03/2012.   On follow up today she states she is doing well and really has no complaints. Denies any increased edema, SOB, chest pain or dizziness. Stays active.    Current Outpatient Prescriptions  Medication Sig Dispense Refill  . acetaminophen (TYLENOL) 500 MG tablet Take 1,000 mg by mouth daily as needed (pain).     Marland Kitchen aspirin EC 81 MG tablet Take 81 mg by mouth daily.    . bisoprolol (ZEBETA) 5 MG tablet Take 1 tablet (5 mg total) by mouth daily. Please keep appointment. 30 tablet 0  . calcitRIOL (ROCALTROL) 0.25 MCG capsule Take 0.25 mcg by mouth 3 (three) times a week.     . cholecalciferol (VITAMIN D) 1000 UNITS tablet Take 1,000 Units by mouth daily. Reported on 05/20/2015    . furosemide (LASIX) 20 MG tablet Take 1.5 tablets (30 mg total) by mouth daily. 45 tablet 3  . gabapentin (NEURONTIN) 100 MG capsule Take 1 capsule (100 mg total) by mouth 2 (two) times daily. 60 capsule 6  . [place] (OYSTER CALCIUM) 500 MG TABS tablet Take 500 mg of elemental calcium by mouth daily. Reported on 05/20/2015    . sertraline (ZOLOFT) 50 MG tablet Take 1 tablet (50 mg total) by mouth daily. 30 tablet 11  . traMADol (ULTRAM) 50 MG tablet Take 1 tablet (50 mg total) by mouth 2 (two)  times daily as needed. 60 tablet 0   No current facility-administered medications for this visit.     Allergies  Allergen Reactions  . Amlodipine Other (See Comments)    edema  . Doxycycline Monohydrate Nausea And Vomiting  . Ramipril Other (See Comments)     Worsening renal function    Past Medical History:  Diagnosis Date  . Abdominal aortic atherosclerosis ([place]) 07/2015   by CT  . Abnormal drug screen 12/2013   abnormally neg xanax (12/2013) and neg xanax and positive EtOH (04/2014), appropriate (08/2014)  . Allergic rhinitis   . Anxiety   . CKD (chronic kidney disease) stage 4, GFR 15-29 ml/min (HCC) 12/2012   per pt after brown recluse bite. sees Dr Juleen China, off ACEI, bp ok slightly elevated to maintain renal perfusion  . 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 DDD (degenerative disc disease), lumbar   . Depression   . Diastolic dysfunction 3149   per echo in 11/2010 and again 03/2012; normal EF  . Diverticulosis    colonsocopy 2002 aborted 2/2 tortuous colon and heavy sigmoid diverticulosis  . Dysphagia    EGD 2012 - wnl, s/p esoph dilation  . GERD (gastroesophageal reflux disease)   . Glucose intolerance (impaired glucose tolerance)   . History of CVA (cerebrovascular accident) 2012  old per prior head CT  . History of diverticulitis of colon   . HLD (hyperlipidemia)   . HTN (hypertension)    Dr. Martinique, cards  . IBS (irritable bowel syndrome)   . Migraine   . Osteoporosis 08/2013   Spine -2.2, hip -3.5  . Rib fractures 10/11/2014  . Right upper lobe pneumonia ([place]) 03/25/2014  . SUI (stress urinary incontinence, female)    Dr. Amalia Hailey, urology (some urge as well)    Past Surgical History:  Procedure Laterality Date  . BREAST ENHANCEMENT SURGERY  1982  . COLONOSCOPY  2002   does not want another! severe diverticulosis with luminal narrowing and spasm Deatra Ina)  . CT HEAD LIMITED W/O CM   04/2010   nothing acute, ? old infarcts L internal capsule  . DEXA  08/2013   Spine -2.2, hip -3.5  . ESOPHAGOGASTRODUODENOSCOPY  2012   WNL, s/p esoph dilation (Dr. Candace Cruise, [facility])  . macroplastique implantation  03/2008   Dr. Rogers Blocker  . renal doppler  2009?   simple cysts, wnl  . submucous resection  1962  . TONSILLECTOMY  1942  . TOTAL ABDOMINAL HYSTERECTOMY  1996   for frequent dysmennorhea  . TUBAL LIGATION  1973  . uretherolysis and removal of macroplastique  09/13/08   Dr. Amalia Hailey  . US ECHOCARDIOGRAPHY  11/2010   EF 55-60%, mild LAE, mild diastolic dysfunction, normal valves    History  Smoking Status  . Former Smoker  . Packs/day: 0.50  . Years: 54.00  . Types: Cigarettes  . Start date: 01/18/1954  . Quit date: 01/19/2008  Smokeless Tobacco  . Never Used    History  Alcohol Use  . 0.0 oz/week    Comment: 1 1/2-2 glasses white wine/day    Family History  Problem Relation Age of Onset  . Heart failure Father   . Heart disease Father   . Cancer Father     prostate  . Hypertension Father   . Coronary artery disease Father   . Leukemia Mother     CLL prior    Review of Systems: The review of systems is per the HPI.  All other systems were reviewed and are negative.  Physical Exam: BP 100/70 (BP Location: Left Arm, Patient Position: Sitting, Cuff Size: Normal)   Pulse (!) 57   Ht 5' (1.524 m)   Wt 114 lb (51.7 kg)   BMI 22.26 kg/m   Patient is very pleasant and in no acute distress.  Skin is warm and dry. Color is normal.  HEENT is unremarkable. Normocephalic/atraumatic. PERRL. Sclera are nonicteric. Neck is supple. No masses. No JVD. Lungs reveal few scattered wheezes.  Cardiac exam shows a regular rate and rhythm. No gallop or murmur. Abdomen is soft. Extremities are without edema. Gait and ROM are intact. No gross neurologic deficits noted.  LABORATORY DATA:   Lab Results  Component Value Date   WBC 8.2 03/08/2016   HGB 11.4 (L) 03/08/2016   HCT 35.2 (L)  03/08/2016   PLT 382.0 03/08/2016   GLUCOSE 97 03/08/2016   CHOL 164 03/08/2016   TRIG 138.0 03/08/2016   HDL 52.30 03/08/2016   LDLDIRECT 103.5 12/03/2010   LDLCALC 84 03/08/2016   ALT 11 06/26/2015   AST 15 06/26/2015   NA 140 03/08/2016   K 4.5 03/08/2016   CL 109 03/08/2016   CREATININE 2.52 (H) 03/08/2016   BUN 48 (H) 03/08/2016   CO2 21 03/08/2016   TSH 3.99 06/26/2015   INR  1.03 08/19/2012   HGBA1C 5.6 10/02/2008   MICROALBUR 31.3 (H) 03/08/2016     Ecg today shows sinus brady with HR 57. Otherwise normal. I have personally reviewed and interpreted this study.   Echo 04/10/12:Study Conclusions  - Left ventricle: The cavity size was normal. Wall thickness was normal. Systolic function was normal. The estimated ejection fraction was in the range of 60% to 65%. Wall motion was normal; there were no regional wall motion abnormalities. Findings consistent with left ventricular diastolic dysfunction. - Aortic valve: Sclerosis without stenosis. - Mitral valve: Mildly calcified annulus.  Assessment / Plan:  1. Chronic diastolic dysfunction - well compensated based on weight and lack of swelling. We discussed the results of her lab work from yesterday showing increased creatinine from 2.2 >> 2.5. She is seeing her nephrologist next week. May consider reduction in lasix dose.  2. COPD  3. CKD -stage III. Creatinine 2.5.   4. HTN - BP is well controlled.    I will follow up in one year.

## 2016-03-08 ENCOUNTER — Encounter: Payer: Self-pay | Admitting: Physical Therapy

## 2016-03-08 ENCOUNTER — Ambulatory Visit (INDEPENDENT_AMBULATORY_CARE_PROVIDER_SITE_OTHER): Payer: Medicare Other

## 2016-03-08 ENCOUNTER — Ambulatory Visit: Payer: Medicare Other | Admitting: Physical Therapy

## 2016-03-08 VITALS — BP 102/70 | HR 56 | Temp 98.0°F | Ht 61.0 in | Wt 111.2 lb

## 2016-03-08 DIAGNOSIS — D631 Anemia in chronic kidney disease: Secondary | ICD-10-CM | POA: Diagnosis not present

## 2016-03-08 DIAGNOSIS — E785 Hyperlipidemia, unspecified: Secondary | ICD-10-CM

## 2016-03-08 DIAGNOSIS — M6281 Muscle weakness (generalized): Secondary | ICD-10-CM

## 2016-03-08 DIAGNOSIS — N184 Chronic kidney disease, stage 4 (severe): Secondary | ICD-10-CM

## 2016-03-08 DIAGNOSIS — R279 Unspecified lack of coordination: Secondary | ICD-10-CM | POA: Diagnosis not present

## 2016-03-08 DIAGNOSIS — E559 Vitamin D deficiency, unspecified: Secondary | ICD-10-CM | POA: Diagnosis not present

## 2016-03-08 DIAGNOSIS — M810 Age-related osteoporosis without current pathological fracture: Secondary | ICD-10-CM | POA: Diagnosis not present

## 2016-03-08 DIAGNOSIS — Z0000 Encounter for general adult medical examination without abnormal findings: Secondary | ICD-10-CM

## 2016-03-08 LAB — RENAL FUNCTION PANEL
Albumin: 4 g/dL (ref 3.5–5.2)
BUN: 48 mg/dL — AB (ref 6–23)
CALCIUM: 9.5 mg/dL (ref 8.4–10.5)
CHLORIDE: 109 meq/L (ref 96–112)
CO2: 21 meq/L (ref 19–32)
CREATININE: 2.52 mg/dL — AB (ref 0.40–1.20)
GFR: 19.42 mL/min — ABNORMAL LOW (ref >60.00)
Glucose, Bld: 97 mg/dL (ref 70–99)
PHOSPHORUS: 4.9 mg/dL — AB (ref 2.3–4.6)
Potassium: 4.5 mEq/L (ref 3.5–5.1)
Sodium: 140 mEq/L (ref 135–145)

## 2016-03-08 LAB — LIPID PANEL
CHOLESTEROL: 164 mg/dL (ref 0–200)
HDL: 52.3 mg/dL (ref >39.00)
LDL Cholesterol: 84 mg/dL (ref 0–99)
NonHDL: 111.68
TRIGLYCERIDES: 138 mg/dL (ref 0.0–149.0)
Total CHOL/HDL Ratio: 3
VLDL: 27.6 mg/dL (ref 0.0–40.0)

## 2016-03-08 LAB — MICROALBUMIN / CREATININE URINE RATIO
CREATININE, U: 115.8 mg/dL
MICROALB/CREAT RATIO: 27 mg/g (ref 0.0–30.0)
Microalb, Ur: 31.3 mg/dL — ABNORMAL HIGH (ref 0.0–1.9)

## 2016-03-08 LAB — CBC WITH DIFFERENTIAL/PLATELET
BASOS PCT: 1.9 % (ref 0.0–3.0)
Basophils Absolute: 0.2 10*3/uL — ABNORMAL HIGH (ref 0.0–0.1)
EOS PCT: 5 % (ref 0.0–5.0)
Eosinophils Absolute: 0.4 10*3/uL (ref 0.0–0.7)
HEMATOCRIT: 35.2 % — AB (ref 36.0–46.0)
HEMOGLOBIN: 11.4 g/dL — AB (ref 12.0–15.0)
LYMPHS PCT: 23.8 % (ref 12.0–46.0)
Lymphs Abs: 2 10*3/uL (ref 0.7–4.0)
MCHC: 32.3 g/dL (ref 30.0–36.0)
MCV: 98.3 fl (ref 78.0–100.0)
MONOS PCT: 9.5 % (ref 3.0–12.0)
Monocytes Absolute: 0.8 10*3/uL (ref 0.1–1.0)
NEUTROS ABS: 4.9 10*3/uL (ref 1.4–7.7)
Neutrophils Relative %: 59.8 % (ref 43.0–77.0)
PLATELETS: 382 10*3/uL (ref 150.0–400.0)
RBC: 3.58 Mil/uL — ABNORMAL LOW (ref 3.87–5.11)
RDW: 15.2 % (ref 11.5–15.5)
WBC: 8.2 10*3/uL (ref 4.0–10.5)

## 2016-03-08 LAB — VITAMIN D 25 HYDROXY (VIT D DEFICIENCY, FRACTURES): VITD: 23.23 ng/mL — ABNORMAL LOW (ref 30.00–100.00)

## 2016-03-08 NOTE — Progress Notes (Signed)
Subjective:   Nicole English is a 83 y.o. female who presents for Medicare Annual (Subsequent) preventive examination.  Review of Systems:  N/A Cardiac Risk Factors include: advanced age (>85men, >58 women);dyslipidemia     Objective:     Vitals: BP 102/70 (BP Location: Left Arm, Patient Position: Sitting, Cuff Size: Normal)   Pulse (!) 56   Temp 98 F (36.7 C) (Oral)   Ht 5\' 1"  (1.549 m) Comment: no shoes  Wt 111 lb 4 oz (50.5 kg)   SpO2 97%   BMI 21.02 kg/m   Body mass index is 21.02 kg/m.   Tobacco History  Smoking Status  . Former Smoker  . Packs/day: 0.50  . Years: 54.00  . Types: Cigarettes  . Start date: 01/18/1954  . Quit date: 01/19/2008  Smokeless Tobacco  . Never Used     Counseling given: No   Past Medical History:  Diagnosis Date  . Abdominal aortic atherosclerosis ([place]) 07/2015   by CT  . Abnormal drug screen 12/2013   abnormally neg xanax (12/2013) and neg xanax and positive EtOH (04/2014), appropriate (08/2014)  . Allergic rhinitis   . Anxiety   . CKD (chronic kidney disease) stage 4, GFR 15-29 ml/min (HCC) 12/2012   per pt after brown recluse bite. sees Dr Juleen China, off ACEI, bp ok slightly elevated to maintain renal perfusion  . 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 DDD (degenerative disc disease), lumbar   . Depression   . Diastolic dysfunction 9379   per echo in 11/2010 and again 03/2012; normal EF  . Diverticulosis    colonsocopy 2002 aborted 2/2 tortuous colon and heavy sigmoid diverticulosis  . Dysphagia    EGD 2012 - wnl, s/p esoph dilation  . GERD (gastroesophageal reflux disease)   . Glucose intolerance (impaired glucose tolerance)   . History of CVA (cerebrovascular accident) 2012   old per prior head CT  . History of diverticulitis of colon   . HLD (hyperlipidemia)   . HTN (hypertension)    Dr. Martinique, cards  . IBS (irritable bowel syndrome)   .  Migraine   . Osteoporosis 08/2013   Spine -2.2, hip -3.5  . Rib fractures 10/11/2014  . Right upper lobe pneumonia ([place]) 03/25/2014  . SUI (stress urinary incontinence, female)    Dr. Amalia Hailey, urology (some urge as well)   Past Surgical History:  Procedure Laterality Date  . BREAST ENHANCEMENT SURGERY  1982  . COLONOSCOPY  2002   does not want another! severe diverticulosis with luminal narrowing and spasm Deatra Ina)  . CT HEAD LIMITED W/O CM  04/2010   nothing acute, ? old infarcts L internal capsule  . DEXA  08/2013   Spine -2.2, hip -3.5  . ESOPHAGOGASTRODUODENOSCOPY  2012   WNL, s/p esoph dilation (Dr. Candace Cruise, [facility])  . macroplastique implantation  03/2008   Dr. Rogers Blocker  . renal doppler  2009?   simple cysts, wnl  . submucous resection  1962  . TONSILLECTOMY  1942  . TOTAL ABDOMINAL HYSTERECTOMY  1996   for frequent dysmennorhea  . TUBAL LIGATION  1973  . uretherolysis and removal of macroplastique  09/13/08   Dr. Amalia Hailey  . US ECHOCARDIOGRAPHY  11/2010   EF 55-60%, mild LAE, mild diastolic dysfunction, normal valves   Family History  Problem Relation Age of Onset  . Heart failure Father   . Heart disease Father   .  Cancer Father     prostate  . Hypertension Father   . Coronary artery disease Father   . Leukemia Mother     CLL prior   History  Sexual Activity  . Sexual activity: Not Currently    Outpatient Encounter Prescriptions as of 03/08/2016  Medication Sig  . acetaminophen (TYLENOL) 500 MG tablet Take 1,000 mg by mouth daily as needed (pain).   Marland Kitchen aspirin EC 81 MG tablet Take 81 mg by mouth daily.  . bisoprolol (ZEBETA) 5 MG tablet Take 1 tablet (5 mg total) by mouth daily. Please keep appointment.  . calcitRIOL (ROCALTROL) 0.25 MCG capsule Take 0.25 mcg by mouth 3 (three) times a week.   . cholecalciferol (VITAMIN D) 1000 UNITS tablet Take 1,000 Units by mouth daily. Reported on 05/20/2015  . furosemide (LASIX) 20 MG tablet Take 1.5 tablets (30 mg total) by mouth daily.    Marland Kitchen gabapentin (NEURONTIN) 100 MG capsule Take 1 capsule (100 mg total) by mouth 2 (two) times daily.  [place] (OYSTER CALCIUM) 500 MG TABS tablet Take 500 mg of elemental calcium by mouth daily. Reported on 05/20/2015  . sertraline (ZOLOFT) 50 MG tablet Take 1 tablet (50 mg total) by mouth daily.  . traMADol (ULTRAM) 50 MG tablet Take 1 tablet (50 mg total) by mouth 2 (two) times daily as needed.   No facility-administered encounter medications on file as of 03/08/2016.     Activities of Daily Living In your present state of health, do you have any difficulty performing the following activities: 03/08/2016  Hearing? N  Vision? N  Difficulty concentrating or making decisions? N  Walking or climbing stairs? N  Dressing or bathing? N  Doing errands, shopping? Y  Preparing Food and eating ? N  Using the Toilet? N  In the past six months, have you accidently leaked urine? N  Do you have problems with loss of bowel control? N  Managing your Medications? N  Managing your Finances? Y  Housekeeping or managing your Housekeeping? N  Some recent data might be hidden    Patient Care Team: Ria Bush, MD as PCP - General Mauri Pole, MD as Consulting Physician (Gastroenterology) Clent Jacks, MD as Consulting Physician (Ophthalmology) Lavonia Dana, MD as Consulting Physician (Internal Medicine) Maryjean Morn, DMD as Consulting Physician (Dentistry)    Assessment:     Hearing Screening   125Hz  250Hz  500Hz  1000Hz  2000Hz  3000Hz  4000Hz  6000Hz  8000Hz   Right ear:   0 0 40  0    Left ear:   40 0 0  0      Visual Acuity Screening   Right eye Left eye Both eyes  Without correction: 20/50 20/40 20/40   With correction:       Exercise Activities and Dietary recommendations Current Exercise Habits: The patient does not participate in regular exercise at present, Exercise limited by: orthopedic condition(s)  Goals    . Increase water intake          Starting 03/08/2016, I  will continue to drink at least 6-8 glasses of water daily.       Fall Risk Fall Risk  03/08/2016 03/07/2015 03/07/2015 08/09/2013  Falls in the past year? No Yes Yes Yes  Number falls in past yr: - 1 1 1   Injury with Fall? - Yes Yes No   Depression Screen PHQ 2/9 Scores 03/08/2016 03/07/2015 08/09/2013  PHQ - 2 Score 0 0 0     Cognitive Function MMSE - Mini Mental  State Exam 03/08/2016  Orientation to time 5  Orientation to Place 5  Registration 3  Attention/ Calculation 0  Recall 1  Recall-comments pt was unable to recall 2 of 3 words  Language- name 2 objects 0  Language- repeat 1  Language- follow 3 step command 3  Language- read & follow direction 0  Write a sentence 0  Copy design 0  Total score 18     PLEASE NOTE: A Mini-Cog screen was completed. Maximum score is 20. A value of 0 denotes this part of [place] MMSE was not completed or the patient failed this part of the Mini-Cog screening.   Mini-Cog Screening Orientation to Time - Max 5 pts Orientation to Place - Max 5 pts Registration - Max 3 pts Recall - Max 3 pts Language Repeat - Max 1 pts Language Follow 3 Step Command - Max 3 pts     Immunization History  Administered Date(s) Administered  . Influenza Split 10/26/2011  . Influenza Whole 10/30/2008, 10/25/2009, 10/23/2012  . Influenza, High Dose Seasonal PF 11/28/2015  . Influenza,inj,Quad PF,36+ Mos 09/19/2013, 10/14/2014  . Pneumococcal Conjugate-13 08/09/2013  . Pneumococcal Polysaccharide-23 12/01/2000, 05/19/2007  . Td 07/03/2002  . Tdap 12/09/2010  . Zoster 08/26/1997, 07/04/2009   Screening Tests Health Maintenance  Topic Date Due  . MAMMOGRAM  05/25/2016  . DTaP/Tdap/Td (2 - Td) 12/08/2020  . TETANUS/TDAP  12/08/2020  . INFLUENZA VACCINE  Addressed  . DEXA SCAN  Completed  . ZOSTAVAX  Completed  . PNA vac Low Risk Adult  Completed      Plan:     I have personally reviewed and addressed the Medicare Annual Wellness questionnaire and  have noted the following in the patient's chart:  A. Medical and social history B. Use of alcohol, tobacco or illicit drugs  C. Current medications and supplements D. Functional ability and status E.  Nutritional status F.  Physical activity G. Advance directives H. List of other physicians I.  Hospitalizations, surgeries, and ER visits in previous 12 months [place] to include hearing, vision, cognitive, depression L. Referrals and appointments - none  In addition, I have reviewed and discussed with patient certain preventive protocols, quality metrics, and best practice recommendations. A written personalized care plan for preventive services as well as general preventive health recommendations were provided to patient.  See attached scanned questionnaire for additional information.   Signed,   Lindell Noe, MHA, BS, LPN Health Coach

## 2016-03-08 NOTE — Therapy (Addendum)
[facility] [address], [place], [place], [address] Phone: [phone]   Fax:  [phone]  Physical Therapy Treatment  Patient Details  Name: Nicole English MRN: [id number] Date of Birth: Aug 12, 1932 Referring Provider: Dr. Silverio Decamp  Encounter Date: 03/08/2016      PT End of Session - 03/08/16 1427    Visit Number 4   Number of Visits 10   Date for PT Re-Evaluation 04/05/16   PT Start Time 1400   PT Stop Time 1440   PT Time Calculation (min) 40 min   Activity Tolerance Patient tolerated treatment well   Behavior During Therapy [facility] for tasks assessed/performed      Past Medical History:  Diagnosis Date  . Abdominal aortic atherosclerosis ([place]) 07/2015   by CT  . Abnormal drug screen 12/2013   abnormally neg xanax (12/2013) and neg xanax and positive EtOH (04/2014), appropriate (08/2014)  . Allergic rhinitis   . Anxiety   . CKD (chronic kidney disease) stage 4, GFR 15-29 ml/min (HCC) 12/2012   per pt after brown recluse bite. sees Dr Juleen China, off ACEI, bp ok slightly elevated to maintain renal perfusion  . 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 DDD (degenerative disc disease), lumbar   . Depression   . Diastolic dysfunction 6962   per echo in 11/2010 and again 03/2012; normal EF  . Diverticulosis    colonsocopy 2002 aborted 2/2 tortuous colon and heavy sigmoid diverticulosis  . Dysphagia    EGD 2012 - wnl, s/p esoph dilation  . GERD (gastroesophageal reflux disease)   . Glucose intolerance (impaired glucose tolerance)   . History of CVA (cerebrovascular accident) 2012   old per prior head CT  . History of diverticulitis of colon   . HLD (hyperlipidemia)   . HTN (hypertension)    Dr. [place], cards  . IBS (irritable bowel syndrome)   . Migraine   . Osteoporosis 08/2013   Spine -2.2, hip -3.5  . Rib fractures 10/11/2014  . Right  upper lobe pneumonia ([place]) 03/25/2014  . SUI (stress urinary incontinence, female)    Dr. Amalia Hailey, urology (some urge as well)    Past Surgical History:  Procedure Laterality Date  . BREAST ENHANCEMENT SURGERY  1982  . COLONOSCOPY  2002   does not want another! severe diverticulosis with luminal narrowing and spasm Deatra Ina)  . CT HEAD LIMITED W/O CM  04/2010   nothing acute, ? old infarcts L internal capsule  . DEXA  08/2013   Spine -2.2, hip -3.5  . ESOPHAGOGASTRODUODENOSCOPY  2012   WNL, s/p esoph dilation (Dr. Candace Cruise, [facility])  . macroplastique implantation  03/2008   Dr. Rogers Blocker  . renal doppler  2009?   simple cysts, wnl  . submucous resection  1962  . TONSILLECTOMY  1942  . TOTAL ABDOMINAL HYSTERECTOMY  1996   for frequent dysmennorhea  . TUBAL LIGATION  1973  . uretherolysis and removal of macroplastique  09/13/08   Dr. Amalia Hailey  . US ECHOCARDIOGRAPHY  11/2010   EF 55-60%, mild LAE, mild diastolic dysfunction, normal valves    There were no vitals filed for this visit.      Subjective Assessment - 03/08/16 1403    Subjective I saw the chiropractor today.  I see the heart doctor tomorrow and kidney doctor next week.    Patient Stated Goals reduce the leakage   Currently in  Pain? No/denies            [facility] PT Assessment - 03/08/16 0001      Palpation   SI assessment  pelvis in correct alignment      g-code: Functional assessment tool used Foto score is 42% limitation; Functional limitation is Other PT primary; Goal status is CJ and discharge status is CJ. Earlie Counts, PT 05/05/16 11:31 AM                 [facility] Adult PT Treatment/Exercise - 03/08/16 0001      Lumbar Exercises: Aerobic   Stationary Bike Nustep 5 min, level 1, arms #10, seat #7     Lumbar Exercises: Supine   Clam 15 reps;1 second  while doing a bridge   Bent Knee Raise 15 reps  with abdominal bracing   Bridge 10 reps;5 seconds   Bridge Limitations with pelvic floor contraction   Other  Supine Lumbar Exercises hookly squeeze ball between knees with pelvic floor contraction   Other Supine Lumbar Exercises abdominal isomteric hold 5 sec 10x each leg with pelvic floor contraction.      Lumbar Exercises: Sidelying   Hip Abduction 10 reps  2 sets with abdominal bracing; bil.    Other Sidelying Lumbar Exercises hip adduction 10x2 bil. with abdominal bracing                PT Education - 03/08/16 1443    Education provided No          PT Short Term Goals - 03/01/16 1406      PT SHORT TERM GOAL #1   Title independent with initial HEP   Time 4   Period Weeks   Status Achieved     PT SHORT TERM GOAL #2   Title fecal leakage decreased >/= 30%   Time 4   Period Weeks   Status Achieved     PT SHORT TERM GOAL #3   Title urinary leakage decreased >/= 30%   Time 4   Period Weeks   Status Achieved           PT Long Term Goals - 03/08/16 1406      PT LONG TERM GOAL #2   Title reduce fecal leakge >/= 80% due to increased pelvic floor strength   Time 8   Period Weeks   Status On-going  wears depends     PT LONG TERM GOAL #3   Title reduce urinary leakage >/= 80% due to increased strength of pelvic floor   Time 8   Period Weeks   Status On-going     PT LONG TERM GOAL #4   Title wear a light day pad instead of a pull up with toilet paper due to a reduction in leakage   Time 8   Period Weeks   Status New  wears depends     PT LONG TERM GOAL #5   Title FOTO score </= 33% limitation   Time 8   Period Weeks   Status New               Plan - 03/08/16 1443    Clinical Impression Statement Patient reports she is not consistent with her HEP.  Patient does not like to have many exercises.  Patient has difficulty with remembering her urinary and fecal leakage.  Patient has not met goals today due to her not knowing if her fecal and urinary leakage has changed.  Patient uses a depends  pad.  Patient works hard in therapy.  Patient will benefit  from skilled therapy to work on strength to reduce leakage.    Rehab Potential Excellent   Clinical Impairments Affecting Rehab Potential Osteoporosis; History of TBI; IBS   PT Frequency 1x / week   PT Duration 8 weeks   PT Treatment/Interventions Biofeedback;Therapeutic activities;Ultrasound;Moist Heat;Therapeutic exercise;Neuromuscular re-education;Patient/family education;Manual techniques;Dry needling   PT Next Visit Plan pelvic floor EMG   PT Home Exercise Plan progress as needed   Consulted and Agree with Plan of Care Patient      Patient will benefit from skilled therapeutic intervention in order to improve the following deficits and impairments:  Decreased coordination, Pain, Decreased strength, Decreased activity tolerance  Visit Diagnosis: Muscle weakness (generalized)  Unspecified lack of coordination     Problem List Patient Active Problem List   Diagnosis Date Noted  . Abdominal aortic atherosclerosis ([place]) 07/19/2015  . Stool incontinence 06/26/2015  . Urinary incontinence 05/21/2015  . Rib pain on right side 05/21/2015  . Advanced care planning/counseling discussion 03/07/2015  . Paresthesias 09/04/2014  . Alcohol use 09/04/2014  . Ruptured silicone breast implant 05/02/2014  . Abnormal drug screen 04/19/2014  . Diarrhea 12/12/2013  . Hip pain, bilateral 06/08/2013  . Chest pain 03/26/2013  . DOE (dyspnea on exertion) 10/27/2012  . Bradycardia 08/18/2012  . Syncope and collapse 04/06/2012  . Chronic back pain 03/29/2012  . COPD GOLD III 10/26/2011  . Diastolic dysfunction   . Medicare annual wellness visit, subsequent 12/09/2010  . Dysphagia 07/10/2010  . History of diverticulitis of colon   . HLD (hyperlipidemia)   . Anxiety   . MDD (major depressive disorder) ([place])   . GERD (gastroesophageal reflux disease)   . IBS (irritable bowel syndrome)   . Allergic rhinitis   . Migraine   . HTN (hypertension)   . SUI (stress urinary incontinence, female)    . CKD (chronic kidney disease) stage 4, GFR 15-29 ml/min (HCC) 12/01/2009  . Vitamin D deficiency 08/21/2008  . Anemia in chronic kidney disease 08/21/2008  . Osteoporosis 05/19/2007  . Insomnia 05/19/2007    Earlie Counts, PT 03/08/16 2:47 PM    [facility] [address], [place], [place], [address] Phone: [phone]   Fax:  [phone]  Name: Nicole English MRN: [id number] Date of Birth: November 04, 1932  PHYSICAL THERAPY DISCHARGE SUMMARY  Visits from Start of Care: 10  Current functional level related to goals / functional outcomes: See above.  Patient has not returned since her last visit and not reschedule for further.    Remaining deficits: See above.   Education / Equipment: HEP Plan: Patient agrees to discharge.  Patient goals were not met. Patient is being discharged due to not returning since the last visit. thank you for the referral. Earlie Counts, PT 05/05/16 11:33 AM   ?????

## 2016-03-08 NOTE — Patient Instructions (Signed)
Nicole English , Thank you for taking time to come for your Medicare Wellness Visit. I appreciate your ongoing commitment to your health goals. Please review the following plan we discussed and let me know if I can assist you in the future.   These are the goals we discussed: Goals    . Increase water intake          Starting 03/08/2016, I will continue to drink at least 6-8 glasses of water daily.        This is a list of the screening recommended for you and due dates:  Health Maintenance  Topic Date Due  . Mammogram  05/25/2016  . DTaP/Tdap/Td vaccine (2 - Td) 12/08/2020  . Tetanus Vaccine  12/08/2020  . Flu Shot  Addressed  . DEXA scan (bone density measurement)  Completed  . Shingles Vaccine  Completed  . Pneumonia vaccines  Completed   Preventive Care for Adults  A healthy lifestyle and preventive care can promote health and wellness. Preventive health guidelines for adults include the following key practices.  . A routine yearly physical is a good way to check with your health care provider about your health and preventive screening. It is a chance to share any concerns and updates on your health and to receive a thorough exam.  . Visit your dentist for a routine exam and preventive care every 6 months. Brush your teeth twice a day and floss once a day. Good oral hygiene prevents tooth decay and gum disease.  . The frequency of eye exams is based on your age, health, family medical history, use  of contact lenses, and other factors. Follow your health care provider's ecommendations for frequency of eye exams.  . Eat a healthy diet. Foods like vegetables, fruits, whole grains, low-fat dairy products, and lean protein foods contain the nutrients you need without too many calories. Decrease your intake of foods high in solid fats, added sugars, and salt. Eat the right amount of calories for you. Get information about a proper diet from your health care provider, if necessary.  .  Regular physical exercise is one of the most important things you can do for your health. Most adults should get at least 150 minutes of moderate-intensity exercise (any activity that increases your heart rate and causes you to sweat) each week. In addition, most adults need muscle-strengthening exercises on 2 or more days a week.  Silver Sneakers may be a benefit available to you. To determine eligibility, you may visit the website: www.silversneakers.com or contact program at [phone]) [phone] Mon-Fri between 8AM-8PM.   . Maintain a healthy weight. The body mass index (BMI) is a screening tool to identify possible weight problems. It provides an estimate of body fat based on height and weight. Your health care provider can find your BMI and can help you achieve or maintain a healthy weight.   For adults 20 years and older: ? A BMI below 18.5 is considered underweight. ? A BMI of 18.5 to 24.9 is normal. ? A BMI of 25 to 29.9 is considered overweight. ? A BMI of 30 and above is considered obese.   . Maintain normal blood lipids and cholesterol levels by exercising and minimizing your intake of saturated fat. Eat a balanced diet with plenty of fruit and vegetables. Blood tests for lipids and cholesterol should begin at age 9 and be repeated every 5 years. If your lipid or cholesterol levels are high, you are over 50, or you are at  high risk for heart disease, you may need your cholesterol levels checked more frequently. Ongoing high lipid and cholesterol levels should be treated with medicines if diet and exercise are not working.  . If you smoke, find out from your health care provider how to quit. If you do not use tobacco, please do not start.  . If you choose to drink alcohol, please do not consume more than 2 drinks per day. One drink is considered to be 12 ounces (355 mL) of beer, 5 ounces (148 mL) of wine, or 1.5 ounces (44 mL) of liquor.  . If you are 29-45 years old, ask your health care  provider if you should take aspirin to prevent strokes.  . Use sunscreen. Apply sunscreen liberally and repeatedly throughout the day. You should seek shade when your shadow is shorter than you. Protect yourself by wearing long sleeves, pants, a wide-brimmed hat, and sunglasses year round, whenever you are outdoors.  . Once a month, do a whole body skin exam, using a mirror to look at the skin on your back. Tell your health care provider of new moles, moles that have irregular borders, moles that are larger than a pencil eraser, or moles that have changed in shape or color.

## 2016-03-08 NOTE — Progress Notes (Signed)
PCP notes:   Health maintenance:  No gaps identified.   Abnormal screenings:   Hearing - failed Mini-Cog score: 18/20  Patient concerns:   None  Nurse concerns:  None  Next PCP appt:   03/15/16 @ 1200

## 2016-03-08 NOTE — Progress Notes (Signed)
Pre visit review using our clinic review tool, if applicable. No additional management support is needed unless otherwise documented below in the visit note. 

## 2016-03-09 ENCOUNTER — Ambulatory Visit (INDEPENDENT_AMBULATORY_CARE_PROVIDER_SITE_OTHER): Payer: Medicare Other | Admitting: Cardiology

## 2016-03-09 ENCOUNTER — Other Ambulatory Visit: Payer: Self-pay | Admitting: Family Medicine

## 2016-03-09 VITALS — BP 100/70 | HR 57 | Ht 60.0 in | Wt 114.0 lb

## 2016-03-09 DIAGNOSIS — E785 Hyperlipidemia, unspecified: Secondary | ICD-10-CM | POA: Diagnosis not present

## 2016-03-09 DIAGNOSIS — I10 Essential (primary) hypertension: Secondary | ICD-10-CM

## 2016-03-09 LAB — PARATHYROID HORMONE, INTACT (NO CA): PTH: 287 pg/mL — ABNORMAL HIGH (ref 14–64)

## 2016-03-09 NOTE — Telephone Encounter (Signed)
Pt left v/m requesting refill for tramadol.pt is out of medication. Last refilled # 60 on 02/09/16; pt last seen 06/26/15 but has annual scheduled 03/15/16 with Dr Darnell Level. [facility] [address]

## 2016-03-09 NOTE — Patient Instructions (Signed)
Continue your current therapy   Discuss your lasix dose with your Nephrologist  I will see you in one year.

## 2016-03-10 NOTE — Telephone Encounter (Signed)
plz phone in. 

## 2016-03-10 NOTE — Telephone Encounter (Signed)
Ultram called in to pharmacy.

## 2016-03-13 NOTE — Progress Notes (Signed)
I reviewed health advisor's note, was available for consultation, and agree with documentation and plan.  

## 2016-03-15 ENCOUNTER — Encounter: Payer: Self-pay | Admitting: Family Medicine

## 2016-03-15 ENCOUNTER — Ambulatory Visit (INDEPENDENT_AMBULATORY_CARE_PROVIDER_SITE_OTHER): Payer: Medicare Other | Admitting: Family Medicine

## 2016-03-15 VITALS — BP 136/80 | HR 60 | Temp 97.7°F | Wt 113.5 lb

## 2016-03-15 DIAGNOSIS — N2581 Secondary hyperparathyroidism of renal origin: Secondary | ICD-10-CM | POA: Diagnosis not present

## 2016-03-15 DIAGNOSIS — N184 Chronic kidney disease, stage 4 (severe): Secondary | ICD-10-CM | POA: Diagnosis not present

## 2016-03-15 DIAGNOSIS — M810 Age-related osteoporosis without current pathological fracture: Secondary | ICD-10-CM | POA: Diagnosis not present

## 2016-03-15 DIAGNOSIS — R159 Full incontinence of feces: Secondary | ICD-10-CM | POA: Diagnosis not present

## 2016-03-15 DIAGNOSIS — J449 Chronic obstructive pulmonary disease, unspecified: Secondary | ICD-10-CM | POA: Diagnosis not present

## 2016-03-15 DIAGNOSIS — I700 Atherosclerosis of aorta: Secondary | ICD-10-CM

## 2016-03-15 DIAGNOSIS — E785 Hyperlipidemia, unspecified: Secondary | ICD-10-CM | POA: Diagnosis not present

## 2016-03-15 DIAGNOSIS — D631 Anemia in chronic kidney disease: Secondary | ICD-10-CM

## 2016-03-15 DIAGNOSIS — I10 Essential (primary) hypertension: Secondary | ICD-10-CM

## 2016-03-15 DIAGNOSIS — K589 Irritable bowel syndrome without diarrhea: Secondary | ICD-10-CM | POA: Diagnosis not present

## 2016-03-15 DIAGNOSIS — E559 Vitamin D deficiency, unspecified: Secondary | ICD-10-CM

## 2016-03-15 NOTE — Assessment & Plan Note (Signed)
Stable period off controller medications. Denies significant dyspnea history.

## 2016-03-15 NOTE — Progress Notes (Signed)
Pre visit review using our clinic review tool, if applicable. No additional management support is needed unless otherwise documented below in the visit note. 

## 2016-03-15 NOTE — Assessment & Plan Note (Signed)
Chronic, stable. Continue current regimen. 

## 2016-03-15 NOTE — Patient Instructions (Addendum)
Restart calcium 500mg  and vitamin D 1000 units daily.  Continue regular weight bearing exercise to keep bones strong. Consider updating bone density scan to see if any change has developed.  Kidneys a bit deteriorated again. Try to alternate lasix 20mg  with 30mg  every other day. Keep follow up with kidney doctor. Good to see you today, call us with questions.

## 2016-03-15 NOTE — Progress Notes (Signed)
BP 136/80   Pulse 60   Temp 97.7 F (36.5 C) (Oral)   Wt 113 lb 8 oz (51.5 kg)   BMI 22.17 kg/m    CC: medicare wellness f/u visit  Subjective:    Patient ID: Nicole English, female    DOB: 06-29-1932, 83 y.o.   MRN: [id number]  HPI: Nicole English is a 83 y.o. female presenting on 03/15/2016 for Annual Exam   Saw Katha Cabal last week for medicare wellness visit. Note reviewed. Failed hearing. minicog 18/20.  Saw cardiology last week as well for chronic diastolic dysfunction.  Saw Dr Silverio Decamp last month for abd pain in IBS and fecal incontinence s/p PT with Earlie Counts. This was helpful.  Planned f/u with nephrology.   Preventative: COLONOSCOPY Date: 2002 does not want another! Aged out Mammogram - 05/2015 Birads 1 Well woman: s/p hysterectomy 1996 for dysmenorrhea. Ovaries removed as well.  DEXA Date: 08/2013 Spine -2.2, hip -3.5. Poor calcium in diet. She does have regular weight bearing exercise - uses stairs Flu yearly  Pneumovax 05/2007, prevnar 2015 Tdap 11/2010 Zostavax 06/2009 Advanced directives: needs to set up. Packet provided 2015. Has 3 sons locally. DIL working on this. Asked to bring copy when complete. Seat belt use discussed Sunscreen use discussed. No changing moles on skin. Ex smoker - quit 2010 Alcohol - rare  Lives with middle son Has family nearby (3 sons locally) - cares for granddaughter [name]) occasionally Occupation: retired, was Holiday representative, stays involved with Environmental manager Activity: stretching regularly  Diet: good water, some vegetables, follows renal diet   Relevant past medical, surgical, family and social history reviewed and updated as indicated. Interim medical history since our last visit reviewed. Allergies and medications reviewed and updated.  Outpatient Medications Prior to Visit  Medication Sig Dispense Refill  . acetaminophen (TYLENOL) 500 MG tablet Take 1,000 mg by mouth daily as needed (pain).     Marland Kitchen aspirin EC 81 MG tablet  Take 81 mg by mouth daily.    . bisoprolol (ZEBETA) 5 MG tablet Take 1 tablet (5 mg total) by mouth daily. Please keep appointment. 30 tablet 0  . calcitRIOL (ROCALTROL) 0.25 MCG capsule Take 0.25 mcg by mouth 3 (three) times a week.     . cholecalciferol (VITAMIN D) 1000 UNITS tablet Take 1,000 Units by mouth daily. Reported on 05/20/2015    . gabapentin (NEURONTIN) 100 MG capsule Take 1 capsule (100 mg total) by mouth 2 (two) times daily. 60 capsule 6  . Oyster Shell (OYSTER CALCIUM) 500 MG TABS tablet Take 500 mg of elemental calcium by mouth daily. Reported on 05/20/2015    . sertraline (ZOLOFT) 50 MG tablet Take 1 tablet (50 mg total) by mouth daily. 30 tablet 11  . traMADol (ULTRAM) 50 MG tablet TAKE 1 TABLET BY MOUTH TWICE A DAY AS NEEDED 60 tablet 0  . furosemide (LASIX) 20 MG tablet Take 1.5 tablets (30 mg total) by mouth daily. 45 tablet 3   No facility-administered medications prior to visit.      Per HPI unless specifically indicated in ROS section below Review of Systems     Objective:    BP 136/80   Pulse 60   Temp 97.7 F (36.5 C) (Oral)   Wt 113 lb 8 oz (51.5 kg)   BMI 22.17 kg/m   Wt Readings from Last 3 Encounters:  03/15/16 113 lb 8 oz (51.5 kg)  03/09/16 114 lb (51.7 kg)  03/08/16 111 lb 4 oz (  50.5 kg)    Physical Exam  Constitutional: She is oriented to person, place, and time. She appears well-developed and well-nourished. No distress.  HENT:  Head: Normocephalic and atraumatic.  Right Ear: Hearing, tympanic membrane, external ear and ear canal normal.  Left Ear: Hearing, tympanic membrane, external ear and ear canal normal.  Nose: Nose normal.  Mouth/Throat: Uvula is midline, oropharynx is clear and moist and mucous membranes are normal. No oropharyngeal exudate, posterior oropharyngeal edema or posterior oropharyngeal erythema.  Eyes: Conjunctivae and EOM are normal. Pupils are equal, round, and reactive to light. No scleral icterus.  Neck: Normal range of  motion. Neck supple. Carotid bruit is not present. No thyromegaly present.  Cardiovascular: Normal rate, regular rhythm, normal heart sounds and intact distal pulses.   No murmur heard. Pulses:      Radial pulses are 2+ on the right side, and 2+ on the left side.  Pulmonary/Chest: Effort normal and breath sounds normal. No respiratory distress. She has no wheezes. She has no rales.  Abdominal: Soft. Bowel sounds are normal. She exhibits no distension and no mass. There is no tenderness. There is no rebound and no guarding.  Musculoskeletal: Normal range of motion. She exhibits no edema.  Lymphadenopathy:    She has no cervical adenopathy.  Neurological: She is alert and oriented to person, place, and time.  CN grossly intact, station and gait intact  Skin: Skin is warm and dry. No rash noted.  Psychiatric: She has a normal mood and affect. Her behavior is normal. Judgment and thought content normal.  Nursing note and vitals reviewed.  Results for orders placed or performed in visit on 03/08/16  Lipid panel  Result Value Ref Range   Cholesterol 164 0 - 200 mg/dL   Triglycerides 138.0 0.0 - 149.0 mg/dL   HDL 52.30 >39.00 mg/dL   VLDL 27.6 0.0 - 40.0 mg/dL   LDL Cholesterol 84 0 - 99 mg/dL   Total CHOL/HDL Ratio 3    NonHDL 111.68   CBC with Differential/Platelet  Result Value Ref Range   WBC 8.2 4.0 - 10.5 K/uL   RBC 3.58 (L) 3.87 - 5.11 Mil/uL   Hemoglobin 11.4 (L) 12.0 - 15.0 g/dL   HCT 35.2 (L) 36.0 - 46.0 %   MCV 98.3 78.0 - 100.0 fl   MCHC 32.3 30.0 - 36.0 g/dL   RDW 15.2 11.5 - 15.5 %   Platelets 382.0 150.0 - 400.0 K/uL   Neutrophils Relative % 59.8 43.0 - 77.0 %   Lymphocytes Relative 23.8 12.0 - 46.0 %   Monocytes Relative 9.5 3.0 - 12.0 %   Eosinophils Relative 5.0 0.0 - 5.0 %   Basophils Relative 1.9 0.0 - 3.0 %   Neutro Abs 4.9 1.4 - 7.7 K/uL   Lymphs Abs 2.0 0.7 - 4.0 K/uL   Monocytes Absolute 0.8 0.1 - 1.0 K/uL   Eosinophils Absolute 0.4 0.0 - 0.7 K/uL    Basophils Absolute 0.2 (H) 0.0 - 0.1 K/uL  Renal function panel  Result Value Ref Range   Sodium 140 135 - 145 mEq/L   Potassium 4.5 3.5 - 5.1 mEq/L   Chloride 109 96 - 112 mEq/L   CO2 21 19 - 32 mEq/L   Calcium 9.5 8.4 - 10.5 mg/dL   Albumin 4.0 3.5 - 5.2 g/dL   BUN 48 (H) 6 - 23 mg/dL   Creatinine, Ser 2.52 (H) 0.40 - 1.20 mg/dL   Glucose, Bld 97 70 - 99 mg/dL  Phosphorus 4.9 (H) 2.3 - 4.6 mg/dL   GFR 19.42 (L) >60.00 mL/min  Microalbumin / creatinine urine ratio  Result Value Ref Range   Microalb, Ur 31.3 (H) 0.0 - 1.9 mg/dL   Creatinine,U 115.8 mg/dL   Microalb Creat Ratio 27.0 0.0 - 30.0 mg/g  VITAMIN D 25 Hydroxy (Vit-D Deficiency, Fractures)  Result Value Ref Range   VITD 23.23 (L) 30.00 - 100.00 ng/mL  Parathyroid hormone, intact (no Ca)  Result Value Ref Range   PTH 287 (H) 14 - 64 pg/mL      Assessment & Plan:   Problem List Items Addressed This Visit    Abdominal aortic atherosclerosis (HCC)    Continue aspirin. Off statin.      Relevant Medications   furosemide (LASIX) 20 MG tablet   Anemia in chronic kidney disease    Hgb 11.4      CKD (chronic kidney disease) stage 4, GFR 15-29 ml/min (HCC) - Primary    Chronic, deteriorated. Discussed with patient. She has f/u with renal next month. Suggested alternate lasix 20mg  with 30mg  daily.       COPD GOLD III    Stable period off controller medications. Denies significant dyspnea history.       HLD (hyperlipidemia)    Chronic, stable. Continue off medication.       Relevant Medications   furosemide (LASIX) 20 MG tablet   HTN (hypertension)    Chronic, stable. Continue current regimen.       Relevant Medications   furosemide (LASIX) 20 MG tablet   IBS (irritable bowel syndrome)    Saw GI. Appreciate their assistance.       Osteoporosis    Encouraged rpt DEXA - she will consider.  Discussed calcium and vit D intake. Calcium intake limited by IBS - avoids dairy products. Discussed regular weight  bearing exercises.  Discussed concerns with increased fracture risk.       Secondary hyperparathyroidism of renal origin [facility])    Reviewed with patient.      Stool incontinence    Appreciate GI care. PT is helping some.       Vitamin D deficiency    rec restart vitamin D 1000 IU daily. She has not been taking.           Follow up plan: Return in about 6 months (around 09/12/2016) for follow up visit.  Ria Bush, MD

## 2016-03-15 NOTE — Assessment & Plan Note (Signed)
Continue aspirin. Off statin.

## 2016-03-15 NOTE — Assessment & Plan Note (Signed)
Hgb: 11.4

## 2016-03-15 NOTE — Assessment & Plan Note (Signed)
Chronic, deteriorated. Discussed with patient. She has f/u with renal next month. Suggested alternate lasix 20mg  with 30mg  daily.

## 2016-03-15 NOTE — Assessment & Plan Note (Signed)
Reviewed with patient

## 2016-03-15 NOTE — Assessment & Plan Note (Signed)
Saw GI. Appreciate their assistance.

## 2016-03-15 NOTE — Assessment & Plan Note (Signed)
Chronic, stable. Continue off medication.

## 2016-03-15 NOTE — Assessment & Plan Note (Addendum)
Encouraged rpt DEXA - she will consider.  Discussed calcium and vit D intake. Calcium intake limited by IBS - avoids dairy products. Discussed regular weight bearing exercises.  Discussed concerns with increased fracture risk.

## 2016-03-15 NOTE — Assessment & Plan Note (Addendum)
Appreciate GI care. PT is helping some.

## 2016-03-15 NOTE — Assessment & Plan Note (Signed)
rec restart vitamin D 1000 IU daily. She has not been taking.

## 2016-03-18 ENCOUNTER — Telehealth: Payer: Self-pay | Admitting: Gastroenterology

## 2016-03-19 NOTE — Telephone Encounter (Signed)
No answer. No voicemail. 

## 2016-03-19 NOTE — Telephone Encounter (Signed)
Spoke with the patient who reports she has improved some on IB Guard. She takes this 3 times a day. She "tries not to need it 4 times a day". Now complains of a new symptom of a "raw" feeling for her entire intestine. Does not complain of indigestion or burping.

## 2016-03-22 ENCOUNTER — Other Ambulatory Visit: Payer: Self-pay

## 2016-03-22 MED ORDER — VSL#3 PO CAPS: 1.0000 | ORAL_CAPSULE | Freq: Every day | ORAL | 0 refills | Status: DC

## 2016-03-22 NOTE — Telephone Encounter (Signed)
Continue IB guard as needed. Patient continues to have functional symptoms. Can try probiotic VSL#3 1 capsule daily for 4 weeks to see if has any improvement.

## 2016-03-22 NOTE — Telephone Encounter (Signed)
Spoke with the patient. She would like to try the VSL #3. Prescription to [facility]. A coupon will be mailed to her. She will check with the pharmacy about the cost.

## 2016-03-23 ENCOUNTER — Other Ambulatory Visit: Payer: Self-pay | Admitting: Cardiology

## 2016-03-30 ENCOUNTER — Telehealth: Payer: Self-pay

## 2016-03-30 NOTE — Telephone Encounter (Signed)
Pt said the last time seen 03/15/16 she discussed with Dr Darnell Level about a problem she has been having with lt hip pain that radiates down the lt leg. It feels like the lt hip is not connecting properly to lt leg. Problem has been going on for a while and pt request referral.pt request cb.

## 2016-03-31 NOTE — Telephone Encounter (Signed)
I don't remember discussing this - would suggest she come in for evaluation. Thanks.

## 2016-04-01 ENCOUNTER — Other Ambulatory Visit: Payer: Self-pay | Admitting: Family Medicine

## 2016-04-01 NOTE — Telephone Encounter (Signed)
Patient notified and appt scheduled.

## 2016-04-02 ENCOUNTER — Ambulatory Visit (INDEPENDENT_AMBULATORY_CARE_PROVIDER_SITE_OTHER): Payer: Medicare Other | Admitting: Family Medicine

## 2016-04-02 ENCOUNTER — Encounter: Payer: Self-pay | Admitting: Family Medicine

## 2016-04-02 VITALS — BP 140/66 | HR 52 | Temp 97.5°F | Wt 111.2 lb

## 2016-04-02 DIAGNOSIS — M25552 Pain in left hip: Secondary | ICD-10-CM | POA: Diagnosis not present

## 2016-04-02 DIAGNOSIS — M79605 Pain in left leg: Secondary | ICD-10-CM | POA: Insufficient documentation

## 2016-04-02 MED ORDER — DICLOFENAC SODIUM 1 % TD GEL: 1.0000 "application " | Freq: Three times a day (TID) | TRANSDERMAL | 1 refills | Status: DC

## 2016-04-02 NOTE — Patient Instructions (Addendum)
I think you have left sided sciatica after fall.  Do exercises provided today. May use tylenol 500mg  twice daily with meals as needed as well as heating pad to left buttock and lateral leg. May use muscle rub or voltaren anti inflammatory gel sent to pharmacy.  Let us know if not improving with treatment.

## 2016-04-02 NOTE — Assessment & Plan Note (Addendum)
Started after fall 3 months ago. No signs of hip fracture. Anticipate L sciatica with trochanteric bursitis. Provided with exercises from [facility] pt advisor for sciatica as well as rec continue tylenol/tramadol PRN pain and prescribed voltaren gel PRN. Discussed further supportive care as per instructions. Currently completing therapy for fecal incontinence and pelvic floor strengthening.  She also has marked weakness of hip abductors and would benefit from strength training to decrease fall risk - consider this in the future.

## 2016-04-02 NOTE — Progress Notes (Signed)
Pre visit review using our clinic review tool, if applicable. No additional management support is needed unless otherwise documented below in the visit note. 

## 2016-04-02 NOTE — Progress Notes (Signed)
BP 140/66 (BP Location: Left Arm, Patient Position: Sitting, Cuff Size: Normal)   Pulse (!) 52   Temp 97.5 F (36.4 C) (Oral)   Wt 111 lb 4 oz (50.5 kg)   SpO2 98%   BMI 21.73 kg/m    CC: L hip pain Subjective:    Patient ID: Nicole English, female    DOB: [birth year], 83 y.o.   MRN: [id number]  HPI: Nicole English is a 83 y.o. female presenting on 04/02/2016 for Hip Pain (X28months )   3 mo h/o L hip pain that started after slip while walking on embedded rocks. She did fall down and landed on L hip. Feels intermittent mild discomfort down lateral hip with radiation into medial knee. Describes burning pain down hip. Some tailbone pain when it happened but not since. No back pain. No fevers/chills, weakness of legs, bowel/bladder accidents. No leg numbness.   Relevant past medical, surgical, family and social history reviewed and updated as indicated. Interim medical history since our last visit reviewed. Allergies and medications reviewed and updated. Outpatient Medications Prior to Visit  Medication Sig Dispense Refill  . acetaminophen (TYLENOL) 500 MG tablet Take 1,000 mg by mouth daily as needed (pain).     Marland Kitchen aspirin EC 81 MG tablet Take 81 mg by mouth daily.    . bisoprolol (ZEBETA) 5 MG tablet Take 1 tablet (5 mg total) by mouth daily. 30 tablet 11  . calcitRIOL (ROCALTROL) 0.25 MCG capsule Take 0.25 mcg by mouth 3 (three) times a week.     . cholecalciferol (VITAMIN D) 1000 UNITS tablet Take 1,000 Units by mouth daily. Reported on 05/20/2015    . furosemide (LASIX) 20 MG tablet Alternate 20mg  with 30mg  every other day    . gabapentin (NEURONTIN) 100 MG capsule Take 1 capsule (100 mg total) by mouth 2 (two) times daily. 60 capsule 6  . Oyster Shell (OYSTER CALCIUM) 500 MG TABS tablet Take 500 mg of elemental calcium by mouth daily. Reported on 05/20/2015    . Probiotic Product (VSL#3) CAPS Take 1 capsule by mouth daily. 30 capsule 0  . sertraline (ZOLOFT) 50 MG tablet TAKE 1 TABLET  BY MOUTH EVERY DAY 30 tablet 6  . traMADol (ULTRAM) 50 MG tablet TAKE 1 TABLET BY MOUTH TWICE A DAY AS NEEDED 60 tablet 0   No facility-administered medications prior to visit.      Per HPI unless specifically indicated in ROS section below Review of Systems     Objective:    BP 140/66 (BP Location: Left Arm, Patient Position: Sitting, Cuff Size: Normal)   Pulse (!) 52   Temp 97.5 F (36.4 C) (Oral)   Wt 111 lb 4 oz (50.5 kg)   SpO2 98%   BMI 21.73 kg/m   Wt Readings from Last 3 Encounters:  04/02/16 111 lb 4 oz (50.5 kg)  03/15/16 113 lb 8 oz (51.5 kg)  03/09/16 114 lb (51.7 kg)    Physical Exam  Constitutional: She appears well-developed and well-nourished. No distress.  Musculoskeletal: She exhibits no edema.  Mild discomfort lumbar midline spine No paraspinous mm tenderness Neg SLR bilaterally. No pain with int/ext rotation at hip. Neg FABER. Tender at L GTB and sciatic notch. Marked weakness of hip abductors with strength testing.  Neurological: She has normal strength. No sensory deficit.  5/5 strength BLE  Skin: Skin is warm and dry.  Nursing note and vitals reviewed.     Assessment & Plan:   Problem  List Items Addressed This Visit    Left hip pain - Primary    Started after fall 3 months ago. No signs of hip fracture. Anticipate L sciatica with trochanteric bursitis. Provided with exercises from [facility] pt advisor for sciatica as well as rec continue tylenol/tramadol PRN pain and prescribed voltaren gel PRN. Discussed further supportive care as per instructions. Currently completing therapy for fecal incontinence and pelvic floor strengthening.  She also has marked weakness of hip abductors and would benefit from strength training to decrease fall risk - consider this in the future.           Follow up plan: Return if symptoms worsen or fail to improve.  Ria Bush, MD

## 2016-04-06 DIAGNOSIS — R809 Proteinuria, unspecified: Secondary | ICD-10-CM | POA: Diagnosis not present

## 2016-04-06 DIAGNOSIS — I129 Hypertensive chronic kidney disease with stage 1 through stage 4 chronic kidney disease, or unspecified chronic kidney disease: Secondary | ICD-10-CM | POA: Diagnosis not present

## 2016-04-06 DIAGNOSIS — E785 Hyperlipidemia, unspecified: Secondary | ICD-10-CM | POA: Diagnosis not present

## 2016-04-06 DIAGNOSIS — N184 Chronic kidney disease, stage 4 (severe): Secondary | ICD-10-CM | POA: Diagnosis not present

## 2016-04-06 DIAGNOSIS — N2581 Secondary hyperparathyroidism of renal origin: Secondary | ICD-10-CM | POA: Diagnosis not present

## 2016-04-06 DIAGNOSIS — E875 Hyperkalemia: Secondary | ICD-10-CM | POA: Diagnosis not present

## 2016-04-07 DIAGNOSIS — D631 Anemia in chronic kidney disease: Secondary | ICD-10-CM | POA: Diagnosis not present

## 2016-04-07 DIAGNOSIS — I5030 Unspecified diastolic (congestive) heart failure: Secondary | ICD-10-CM | POA: Diagnosis not present

## 2016-04-07 DIAGNOSIS — I129 Hypertensive chronic kidney disease with stage 1 through stage 4 chronic kidney disease, or unspecified chronic kidney disease: Secondary | ICD-10-CM | POA: Diagnosis not present

## 2016-04-07 DIAGNOSIS — E875 Hyperkalemia: Secondary | ICD-10-CM | POA: Diagnosis not present

## 2016-04-07 DIAGNOSIS — N184 Chronic kidney disease, stage 4 (severe): Secondary | ICD-10-CM | POA: Diagnosis not present

## 2016-04-07 DIAGNOSIS — N2581 Secondary hyperparathyroidism of renal origin: Secondary | ICD-10-CM | POA: Diagnosis not present

## 2016-04-10 ENCOUNTER — Other Ambulatory Visit: Payer: Self-pay | Admitting: Family Medicine

## 2016-04-12 NOTE — Telephone Encounter (Signed)
plz phone in. 

## 2016-04-12 NOTE — Telephone Encounter (Signed)
Rx called in as directed.   

## 2016-04-20 ENCOUNTER — Other Ambulatory Visit: Payer: Self-pay | Admitting: Family Medicine

## 2016-04-20 DIAGNOSIS — Z1231 Encounter for screening mammogram for malignant neoplasm of breast: Secondary | ICD-10-CM

## 2016-05-04 DIAGNOSIS — L812 Freckles: Secondary | ICD-10-CM | POA: Diagnosis not present

## 2016-05-04 DIAGNOSIS — L821 Other seborrheic keratosis: Secondary | ICD-10-CM | POA: Diagnosis not present

## 2016-05-04 DIAGNOSIS — D1801 Hemangioma of skin and subcutaneous tissue: Secondary | ICD-10-CM | POA: Diagnosis not present

## 2016-05-04 DIAGNOSIS — Z85828 Personal history of other malignant neoplasm of skin: Secondary | ICD-10-CM | POA: Diagnosis not present

## 2016-05-14 ENCOUNTER — Other Ambulatory Visit: Payer: Self-pay | Admitting: Family Medicine

## 2016-05-14 NOTE — Telephone Encounter (Signed)
Pt left v/m requesting status of refill tramadol; pt request cb today. Last refilled # 60 on 04/12/16. Last seen 04/02/16.

## 2016-05-14 NOTE — Telephone Encounter (Signed)
Rx phoned in. She needs to give [place] more time to fill Rx.

## 2016-05-14 NOTE — Telephone Encounter (Signed)
Ok to refill? Last filled 04/12/16 #60 0RF

## 2016-05-16 ENCOUNTER — Other Ambulatory Visit: Payer: Self-pay | Admitting: Family Medicine

## 2016-05-24 ENCOUNTER — Other Ambulatory Visit: Payer: Self-pay | Admitting: Family Medicine

## 2016-05-26 ENCOUNTER — Ambulatory Visit
Admission: RE | Admit: 2016-05-26 | Discharge: 2016-05-26 | Disposition: A | Discharge status: Home / Self Care | Payer: Medicare Other | Source: Ambulatory Visit | Attending: Family Medicine | Admitting: Family Medicine

## 2016-05-26 DIAGNOSIS — Z1231 Encounter for screening mammogram for malignant neoplasm of breast: Secondary | ICD-10-CM | POA: Diagnosis not present

## 2016-05-26 LAB — HM MAMMOGRAPHY

## 2016-05-27 ENCOUNTER — Encounter: Payer: Self-pay | Admitting: Family Medicine

## 2016-05-27 ENCOUNTER — Encounter: Payer: Self-pay | Admitting: *Deleted

## 2016-05-30 ENCOUNTER — Emergency Department (HOSPITAL_COMMUNITY): Payer: Medicare Other

## 2016-05-30 ENCOUNTER — Other Ambulatory Visit: Payer: Self-pay | Admitting: Family Medicine

## 2016-05-30 ENCOUNTER — Inpatient Hospital Stay (HOSPITAL_COMMUNITY)
Admission: EM | Admit: 2016-05-30 | Discharge: 2016-06-04 | DRG: 291 | Disposition: A | Discharge status: Skilled Nursing Facility | Payer: Medicare Other | Attending: Family Medicine | Admitting: Family Medicine

## 2016-05-30 ENCOUNTER — Encounter (HOSPITAL_COMMUNITY): Payer: Self-pay

## 2016-05-30 DIAGNOSIS — I5033 Acute on chronic diastolic (congestive) heart failure: Secondary | ICD-10-CM | POA: Diagnosis not present

## 2016-05-30 DIAGNOSIS — I130 Hypertensive heart and chronic kidney disease with heart failure and stage 1 through stage 4 chronic kidney disease, or unspecified chronic kidney disease: Principal | ICD-10-CM | POA: Diagnosis present

## 2016-05-30 DIAGNOSIS — F329 Major depressive disorder, single episode, unspecified: Secondary | ICD-10-CM | POA: Diagnosis present

## 2016-05-30 DIAGNOSIS — R0789 Other chest pain: Secondary | ICD-10-CM | POA: Diagnosis not present

## 2016-05-30 DIAGNOSIS — Z7952 Long term (current) use of systemic steroids: Secondary | ICD-10-CM

## 2016-05-30 DIAGNOSIS — R079 Chest pain, unspecified: Secondary | ICD-10-CM | POA: Diagnosis not present

## 2016-05-30 DIAGNOSIS — R7989 Other specified abnormal findings of blood chemistry: Secondary | ICD-10-CM | POA: Diagnosis present

## 2016-05-30 DIAGNOSIS — Z8673 Personal history of transient ischemic attack (TIA), and cerebral infarction without residual deficits: Secondary | ICD-10-CM

## 2016-05-30 DIAGNOSIS — E875 Hyperkalemia: Secondary | ICD-10-CM | POA: Diagnosis present

## 2016-05-30 DIAGNOSIS — R001 Bradycardia, unspecified: Secondary | ICD-10-CM | POA: Diagnosis present

## 2016-05-30 DIAGNOSIS — K589 Irritable bowel syndrome without diarrhea: Secondary | ICD-10-CM | POA: Diagnosis present

## 2016-05-30 DIAGNOSIS — E871 Hypo-osmolality and hyponatremia: Secondary | ICD-10-CM | POA: Diagnosis not present

## 2016-05-30 DIAGNOSIS — F419 Anxiety disorder, unspecified: Secondary | ICD-10-CM | POA: Diagnosis present

## 2016-05-30 DIAGNOSIS — N184 Chronic kidney disease, stage 4 (severe): Secondary | ICD-10-CM | POA: Diagnosis present

## 2016-05-30 DIAGNOSIS — J449 Chronic obstructive pulmonary disease, unspecified: Secondary | ICD-10-CM | POA: Diagnosis present

## 2016-05-30 DIAGNOSIS — Z881 Allergy status to other antibiotic agents status: Secondary | ICD-10-CM

## 2016-05-30 DIAGNOSIS — Z87891 Personal history of nicotine dependence: Secondary | ICD-10-CM

## 2016-05-30 DIAGNOSIS — Z794 Long term (current) use of insulin: Secondary | ICD-10-CM

## 2016-05-30 DIAGNOSIS — R0602 Shortness of breath: Secondary | ICD-10-CM

## 2016-05-30 DIAGNOSIS — R42 Dizziness and giddiness: Secondary | ICD-10-CM

## 2016-05-30 DIAGNOSIS — G43909 Migraine, unspecified, not intractable, without status migrainosus: Secondary | ICD-10-CM | POA: Diagnosis present

## 2016-05-30 DIAGNOSIS — I10 Essential (primary) hypertension: Secondary | ICD-10-CM

## 2016-05-30 DIAGNOSIS — M810 Age-related osteoporosis without current pathological fracture: Secondary | ICD-10-CM | POA: Diagnosis present

## 2016-05-30 DIAGNOSIS — Z7982 Long term (current) use of aspirin: Secondary | ICD-10-CM

## 2016-05-30 LAB — TSH: TSH: 2.394 u[IU]/mL (ref 0.350–4.500)

## 2016-05-30 LAB — CBC
HEMATOCRIT: 38.5 % (ref 36.0–46.0)
HEMOGLOBIN: 12.2 g/dL (ref 12.0–15.0)
MCH: 31.7 pg (ref 26.0–34.0)
MCHC: 31.7 g/dL (ref 30.0–36.0)
MCV: 100 fL (ref 78.0–100.0)
Platelets: 271 10*3/uL (ref 150–400)
RBC: 3.85 MIL/uL — ABNORMAL LOW (ref 3.87–5.11)
RDW: 13.4 % (ref 11.5–15.5)
WBC: 8 10*3/uL (ref 4.0–10.5)

## 2016-05-30 LAB — BASIC METABOLIC PANEL
ANION GAP: 8 (ref 5–15)
BUN: 28 mg/dL — ABNORMAL HIGH (ref 6–20)
CO2: 22 mmol/L (ref 22–32)
Calcium: 9.8 mg/dL (ref 8.9–10.3)
Chloride: 107 mmol/L (ref 101–111)
Creatinine, Ser: 2.11 mg/dL — ABNORMAL HIGH (ref 0.44–1.00)
GFR calc Af Amer: 24 mL/min — ABNORMAL LOW (ref >60)
GFR calc non Af Amer: 21 mL/min — ABNORMAL LOW (ref >60)
GLUCOSE: 105 mg/dL — AB (ref 65–99)
POTASSIUM: 5.2 mmol/L — AB (ref 3.5–5.1)
Sodium: 137 mmol/L (ref 135–145)

## 2016-05-30 LAB — I-STAT TROPONIN, ED: Troponin i, poc: 0 ng/mL (ref 0.00–0.08)

## 2016-05-30 LAB — TROPONIN I
Troponin I: <0.03 ng/mL (ref <0.03)
Troponin I: <0.03 ng/mL (ref <0.03)

## 2016-05-30 LAB — CK: Total CK: 30 U/L — ABNORMAL LOW (ref 38–234)

## 2016-05-30 LAB — D-DIMER, QUANTITATIVE: D-Dimer, Quant: 0.67 ug/mL-FEU — ABNORMAL HIGH (ref 0.00–0.50)

## 2016-05-30 MED ORDER — SODIUM CHLORIDE 0.9 % IV SOLN
INTRAVENOUS | Status: DC
  Administered 2016-05-30: 18:00:00 via INTRAVENOUS

## 2016-05-30 MED ORDER — METOPROLOL TARTRATE 5 MG/5ML IV SOLN
5.0000 mg | Freq: Once | INTRAVENOUS | Status: AC
  Administered 2016-05-30: 5 mg via INTRAVENOUS
  Filled 2016-05-30: qty 5

## 2016-05-30 MED ORDER — TRAMADOL HCL 50 MG PO TABS
50.0000 mg | ORAL_TABLET | Freq: Two times a day (BID) | ORAL | Status: DC | PRN
  Administered 2016-05-30 – 2016-06-02 (×8): 50 mg via ORAL
  Filled 2016-05-30 (×8): qty 1

## 2016-05-30 MED ORDER — HYDRALAZINE HCL 50 MG PO TABS
50.0000 mg | ORAL_TABLET | Freq: Three times a day (TID) | ORAL | Status: DC
  Administered 2016-05-30 – 2016-06-04 (×15): 50 mg via ORAL
  Filled 2016-05-30 (×15): qty 1

## 2016-05-30 MED ORDER — ACETAMINOPHEN 325 MG PO TABS
650.0000 mg | ORAL_TABLET | ORAL | Status: DC | PRN
  Administered 2016-05-30 – 2016-06-03 (×7): 650 mg via ORAL
  Filled 2016-05-30 (×7): qty 2

## 2016-05-30 MED ORDER — SERTRALINE HCL 50 MG PO TABS
50.0000 mg | ORAL_TABLET | Freq: Every day | ORAL | Status: DC
  Administered 2016-05-30: 50 mg via ORAL
  Filled 2016-05-30 (×2): qty 1

## 2016-05-30 MED ORDER — OYSTER CALCIUM 500 MG PO TABS
500.0000 mg | ORAL_TABLET | Freq: Every day | ORAL | Status: DC
  Administered 2016-06-01 – 2016-06-04 (×4): 500 mg via ORAL
  Filled 2016-05-30 (×9): qty 1

## 2016-05-30 MED ORDER — FUROSEMIDE 10 MG/ML IJ SOLN
30.0000 mg | Freq: Once | INTRAMUSCULAR | Status: AC
  Administered 2016-05-30: 30 mg via INTRAVENOUS
  Filled 2016-05-30: qty 4

## 2016-05-30 MED ORDER — MORPHINE SULFATE (PF) 2 MG/ML IV SOLN
1.0000 mg | INTRAVENOUS | Status: DC | PRN
  Administered 2016-05-30: 1 mg via INTRAVENOUS
  Filled 2016-05-30: qty 1

## 2016-05-30 MED ORDER — HYDRALAZINE HCL 20 MG/ML IJ SOLN
10.0000 mg | Freq: Four times a day (QID) | INTRAMUSCULAR | Status: DC | PRN
  Administered 2016-05-30: 10 mg via INTRAVENOUS
  Filled 2016-05-30: qty 1

## 2016-05-30 MED ORDER — NITROGLYCERIN 0.4 MG SL SUBL: 0.4000 mg | SUBLINGUAL_TABLET | SUBLINGUAL | Status: DC | PRN

## 2016-05-30 MED ORDER — VITAMIN D 1000 UNITS PO TABS
1000.0000 [IU] | ORAL_TABLET | Freq: Every day | ORAL | Status: DC
  Administered 2016-05-31 – 2016-06-04 (×5): 1000 [IU] via ORAL
  Filled 2016-05-30 (×5): qty 1

## 2016-05-30 MED ORDER — BISOPROLOL FUMARATE 5 MG PO TABS
5.0000 mg | ORAL_TABLET | Freq: Every day | ORAL | Status: DC
  Administered 2016-05-31 – 2016-06-04 (×5): 5 mg via ORAL
  Filled 2016-05-30 (×5): qty 1

## 2016-05-30 MED ORDER — CALCITRIOL 0.25 MCG PO CAPS
0.2500 ug | ORAL_CAPSULE | ORAL | Status: DC
  Administered 2016-05-31 – 2016-06-04 (×3): 0.25 ug via ORAL
  Filled 2016-05-30 (×3): qty 1

## 2016-05-30 MED ORDER — ONDANSETRON HCL 4 MG/2ML IJ SOLN: 4.0000 mg | Freq: Four times a day (QID) | INTRAMUSCULAR | Status: DC | PRN

## 2016-05-30 MED ORDER — ASPIRIN EC 81 MG PO TBEC
81.0000 mg | DELAYED_RELEASE_TABLET | Freq: Every day | ORAL | Status: DC
  Administered 2016-05-31 – 2016-06-04 (×5): 81 mg via ORAL
  Filled 2016-05-30 (×5): qty 1

## 2016-05-30 MED ORDER — GABAPENTIN 100 MG PO CAPS
100.0000 mg | ORAL_CAPSULE | Freq: Two times a day (BID) | ORAL | Status: DC
  Administered 2016-05-30 – 2016-06-04 (×10): 100 mg via ORAL
  Filled 2016-05-30 (×10): qty 1

## 2016-05-30 MED ORDER — ASPIRIN 81 MG PO CHEW
243.0000 mg | CHEWABLE_TABLET | Freq: Once | ORAL | Status: AC
  Administered 2016-05-30: 243 mg via ORAL
  Filled 2016-05-30: qty 3

## 2016-05-30 MED ORDER — ENOXAPARIN SODIUM 40 MG/0.4ML ~~LOC~~ SOLN
40.0000 mg | SUBCUTANEOUS | Status: DC
Start: 2016-05-30 — End: 2016-06-01
  Administered 2016-05-30 – 2016-05-31 (×2): 40 mg via SUBCUTANEOUS
  Filled 2016-05-30 (×2): qty 0.4

## 2016-05-30 NOTE — ED Provider Notes (Signed)
[facility] DEPT Provider Note   CSN: [id number] Arrival date & time: 05/30/16  1040     History   Chief Complaint Chief Complaint  Patient presents with  . Chest Pain  . Shortness of Breath    HPI Nicole English is a 82 y.o. female.  HPI Patient presents to the emergency room for evaluation of chest pressure.  Patient states the symptoms started last night when she went to bed. This was around midnight last night. Since then she's had constant pressure on the right side of her chest. Symptoms seemed to get worse when she takes deep breath. She also feels that she can't take a deep breath. She denies any leg swelling. No radiation of the pain. No diaphoresis. No history of PE or DVT. No history of heart disease. No numbness or weakness. Patient does have history of hypertension and has been taking her medications regularly. Past Medical History:  Diagnosis Date  . Abdominal aortic atherosclerosis ([place]) 07/2015   by CT  . Abnormal drug screen 12/2013   abnormally neg xanax (12/2013) and neg xanax and positive EtOH (04/2014), appropriate (08/2014)  . Allergic rhinitis   . Anxiety   . CKD (chronic kidney disease) stage 4, GFR 15-29 ml/min (HCC) 12/2012   per pt after brown recluse bite. sees Dr Juleen China, off ACEI, bp ok slightly elevated to maintain renal perfusion  . 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 DDD (degenerative disc disease), lumbar   . Depression   . Diastolic dysfunction 7628   per echo in 11/2010 and again 03/2012; normal EF  . Diverticulosis    colonsocopy 2002 aborted 2/2 tortuous colon and heavy sigmoid diverticulosis  . Dysphagia    EGD 2012 - wnl, s/p esoph dilation  . GERD (gastroesophageal reflux disease)   . Glucose intolerance (impaired glucose tolerance)   . History of CVA (cerebrovascular accident) 2012   old per prior head CT  . History of diverticulitis of colon   . HLD  (hyperlipidemia)   . HTN (hypertension)    Dr. Martinique, cards  . IBS (irritable bowel syndrome)   . Migraine   . Osteoporosis 08/2013   Spine -2.2, hip -3.5  . Rib fractures 10/11/2014  . Right upper lobe pneumonia ([place]) 03/25/2014  . SUI (stress urinary incontinence, female)    Dr. Amalia Hailey, urology (some urge as well)    Patient Active Problem List   Diagnosis Date Noted  . Left hip pain 04/02/2016  . Secondary hyperparathyroidism of renal origin ([place]) 03/15/2016  . Abdominal aortic atherosclerosis ([place]) 07/19/2015  . Stool incontinence 06/26/2015  . Urinary incontinence 05/21/2015  . Rib pain on right side 05/21/2015  . Advanced care planning/counseling discussion 03/07/2015  . Paresthesias 09/04/2014  . Alcohol use 09/04/2014  . Ruptured silicone breast implant 05/02/2014  . Diarrhea 12/12/2013  . Chest pain 03/26/2013  . Bradycardia 08/18/2012  . Syncope and collapse 04/06/2012  . Chronic back pain 03/29/2012  . COPD GOLD III 10/26/2011  . Diastolic dysfunction   . Medicare annual wellness visit, subsequent 12/09/2010  . Dysphagia 07/10/2010  . History of diverticulitis of colon   . HLD (hyperlipidemia)   . Anxiety   . MDD (major depressive disorder) ([place])   . GERD (gastroesophageal reflux disease)   . IBS (irritable bowel syndrome)   . Allergic rhinitis   . Migraine   . HTN (hypertension)   . SUI (stress  urinary incontinence, female)   . CKD (chronic kidney disease) stage 4, GFR 15-29 ml/min (HCC) 12/01/2009  . Vitamin D deficiency 08/21/2008  . Anemia in chronic kidney disease 08/21/2008  . Osteoporosis 05/19/2007  . Insomnia 05/19/2007    Past Surgical History:  Procedure Laterality Date  . BREAST ENHANCEMENT SURGERY  1982  . COLONOSCOPY  2002   does not want another! severe diverticulosis with luminal narrowing and spasm Deatra Ina)  . CT HEAD LIMITED W/O CM  04/2010   nothing acute, ? old infarcts L internal capsule  . DEXA  08/2013   Spine -2.2, hip -3.5  .  ESOPHAGOGASTRODUODENOSCOPY  2012   WNL, s/p esoph dilation (Dr. Candace Cruise, [facility])  . macroplastique implantation  03/2008   Dr. Rogers Blocker  . renal doppler  2009?   simple cysts, wnl  . submucous resection  1962  . TONSILLECTOMY  1942  . TOTAL ABDOMINAL HYSTERECTOMY  1996   for frequent dysmennorhea  . TUBAL LIGATION  1973  . uretherolysis and removal of macroplastique  09/13/08   Dr. Amalia Hailey  . US ECHOCARDIOGRAPHY  11/2010   EF 55-60%, mild LAE, mild diastolic dysfunction, normal valves    OB History    No data available       Home Medications    Prior to Admission medications   Medication Sig Start Date End Date Taking? Authorizing Provider  acetaminophen (TYLENOL) 500 MG tablet Take 1,000 mg by mouth daily as needed (pain).    Yes [provider]  aspirin EC 81 MG tablet Take 81 mg by mouth daily.   Yes [provider]  bisoprolol (ZEBETA) 5 MG tablet Take 1 tablet (5 mg total) by mouth daily. 03/23/16  Yes Martinique, Peter M, MD  cholecalciferol (VITAMIN D) 1000 UNITS tablet Take 1,000 Units by mouth daily. Reported on 05/20/2015   Yes [provider]  furosemide (LASIX) 20 MG tablet TAKE 1 & 1/2 TABLETS BY MOUTH EVERY DAY 05/24/16  Yes Ria Bush, MD  gabapentin (NEURONTIN) 100 MG capsule Take 1 capsule (100 mg total) by mouth 2 (two) times daily. 10/16/15  Yes Ria Bush, MD  Oyster Shell (OYSTER CALCIUM) 500 MG TABS tablet Take 500 mg of elemental calcium by mouth daily. Reported on 05/20/2015   Yes [provider]  Probiotic Product (VSL#3) CAPS Take 1 capsule by mouth daily. 03/22/16  Yes Mauri Pole, MD  sertraline (ZOLOFT) 50 MG tablet TAKE 1 TABLET BY MOUTH EVERY DAY 04/01/16  Yes Ria Bush, MD  traMADol (ULTRAM) 50 MG tablet TAKE 1 TABLET TWICE A DAY AS NEEDED 05/14/16  Yes Ria Bush, MD  calcitRIOL (ROCALTROL) 0.25 MCG capsule Take 0.25 mcg by mouth 3 (three) times a week.     [provider]  diclofenac sodium  (VOLTAREN) 1 % GEL Apply 1 application topically 3 (three) times daily. 04/02/16   Ria Bush, MD    Family History Family History  Problem Relation Age of Onset  . Heart failure Father   . Heart disease Father   . Cancer Father        prostate  . Hypertension Father   . Coronary artery disease Father   . Leukemia Mother        CLL prior  . Breast cancer Maternal Aunt   . Breast cancer Paternal Aunt     Social History Social History  Substance Use Topics  . Smoking status: Former Smoker    Packs/day: 0.50    Years: 54.00  Types: Cigarettes    Start date: 01/18/1954    Quit date: 01/19/2008  . Smokeless tobacco: Never Used  . Alcohol use 0.0 oz/week     Comment: 1 1/2-2 glasses white wine/day     Allergies   Amlodipine; Doxycycline monohydrate; and Ramipril   Review of Systems Review of Systems  Neurological: Positive for headaches (patient describes some mild head pressure).  All other systems reviewed and are negative.    Physical Exam Updated Vital Signs BP (!) 170/75   Pulse 60   Temp 98 F (36.7 C) (Oral)   Resp 18   Ht 5\' 2"  (1.575 m)   Wt 51.3 kg   SpO2 98%   BMI 20.67 kg/m   Physical Exam  Constitutional: No distress.  HENT:  Head: Normocephalic and atraumatic.  Right Ear: External ear normal.  Left Ear: External ear normal.  Eyes: Conjunctivae are normal. Right eye exhibits no discharge. Left eye exhibits no discharge. No scleral icterus.  Neck: Neck supple. No tracheal deviation present.  Cardiovascular: Normal rate, regular rhythm and intact distal pulses.   Pulmonary/Chest: Effort normal. No stridor. No respiratory distress. She has no wheezes. She has rales (few rales on the right side).  Abdominal: Soft. Bowel sounds are normal. She exhibits no distension. There is no tenderness. There is no rebound and no guarding.  Musculoskeletal: She exhibits no edema or tenderness.  Neurological: She is alert. She has normal strength. No  cranial nerve deficit (no facial droop, extraocular movements intact, no slurred speech) or sensory deficit. She exhibits normal muscle tone. She displays no seizure activity. Coordination normal.  Skin: Skin is warm and dry. No rash noted.  Psychiatric: She has a normal mood and affect.  Nursing note and vitals reviewed.    ED Treatments / Results  Labs (all labs ordered are listed, but only abnormal results are displayed) Labs Reviewed  BASIC METABOLIC PANEL - Abnormal; Notable for the following:       Result Value   Potassium 5.2 (*)    Glucose, Bld 105 (*)    BUN 28 (*)    Creatinine, Ser 2.11 (*)    GFR calc non Af Amer 21 (*)    GFR calc Af Amer 24 (*)    All other components within normal limits  CBC - Abnormal; Notable for the following:    RBC 3.85 (*)    All other components within normal limits  D-DIMER, QUANTITATIVE (NOT AT [facility]) - Abnormal; Notable for the following:    D-Dimer, Quant 0.67 (*)    All other components within normal limits  I-STAT TROPOININ, ED    EKG  EKG Interpretation  Date/Time:  Sunday May 30 2016 10:54:56 EDT Ventricular Rate:  56 PR Interval:  164 QRS Duration: 84 QT Interval:  408 QTC Calculation: 393 R Axis:   75 Text Interpretation:  Sinus bradycardia with Premature atrial complexes Otherwise normal ECG Since last tracing rate slower Confirmed by Delene Morais  MD-J, Rula [place] ([id number]) on 05/30/2016 1:32:12 PM       Radiology Dg Chest 2 View  Result Date: 05/30/2016 CLINICAL DATA:  Chest pain EXAM: CHEST  2 VIEW COMPARISON:  10/15/2014 FINDINGS: Heart size within normal limits. Atherosclerotic aorta. Negative for heart failure. Lungs are clear without infiltrate or effusion. COPD with pulmonary hyperinflation. IMPRESSION: No active cardiopulmonary disease. Electronically Signed   By: Franchot Gallo M.D.   On: 05/30/2016 11:30    Procedures Procedures (including critical care time)  Medications Ordered in  ED Medications  metoprolol  (LOPRESSOR) injection 5 mg (5 mg Intravenous Given 05/30/16 1212)     Initial Impression / Assessment and Plan / ED Course  I have reviewed the triage vital signs and the nursing notes.  Pertinent labs & imaging results that were available during my care of the patient were reviewed by me and considered in my medical decision making (see chart for details).   Pt presents to the ED with complaints of chest pain, elevated blood pressure.  Heart score of 4.  Moderate risk for heart disease.  Pt also has an elevated d dimer.  Would be within age adjusted norms but still a possibility.  Pt is concerned about her pain.  She lives alone.  BP remains elevated.  Will consult with medical service for further eval of her chest pain  Final Clinical Impressions(s) / ED Diagnoses   Final diagnoses:  Chest pain, unspecified type  Hypertension, unspecified type      Dorie Rank, MD 05/30/16 1341

## 2016-05-30 NOTE — H&P (Signed)
History and Physical    Nicole English QIO:[id number] DOB: April 07, 1932 DOA: 05/30/2016   PCP: Ria Bush, MD   Patient coming from/Resides with: Private residence/son and daughter-in-law  Admission status: Observation/telemetry -may be medically necessary to stay a minimum 2 midnights to rule out impending and/or unexpected changes in physiologic status that may differ from initial evaluation performed in the ER and/or at time of admission-consider reevaluation of admission status in 24 hours.   Chief Complaint: Right-sided chest pain less than 24 hours  HPI: Nicole English is a 83 y.o. female with medical history significant for hypertension, chronic diastolic dysfunction. Chronic kidney disease stage IV on Lasix, DDD, osteoporosis, COPD and chronic drug related bradycardia. She has a prior history of CVA and diverticular bleed. Patient reports beginning yesterday she developed onset of pressure-like right-sided chest pain underneath the right breast that has been constant in nature and she reports this makes it difficult to catch her breath although she denies pleuritic type symptoms. No diaphoresis. No GI symptoms. She's not had any lower extremity edema. She is not taking any long trips by automobile or plane. In the ER she was not hypoxemic. She had a bradycardic rate which is chronic on beta blockers. Her d-dimer was mildly elevated at 0.67. She's had some ongoing issues with bilateral hip pain and has been followed by her primary care physician but has not been bedbound because of this. She recently had an insect bite to her right lateral lower leg without any significant redness, edema, or any constitutional symptoms.  ED Course:  Vital Signs: BP (!) 170/75   Pulse 60   Temp 98 F (36.7 C) (Oral)   Resp 18   Ht 5\' 2"  (1.575 m)   Wt 51.3 kg (113 lb)   SpO2 98%   BMI 20.67 kg/m  2 view CXR: Neg Lab data: Sodium 137, potassium 5.2, chloride 107, CO2 22, glucose 105, BUN 28,  creatinine 2.11, anion gap 8, white count 8000 no differential obtained, hemoglobin 12.2, platelet was 271,000, D dimer 0.67 Medications and treatments: Lopressor 5 mg IV 1  Review of Systems:  In addition to the HPI above,  No Fever-chills, myalgias or other constitutional symptoms No Headache, changes with Vision or hearing, new weakness, tingling, numbness in any extremity, dizziness, dysarthria or word finding difficulty, gait disturbance or imbalance, tremors or seizure activity No problems swallowing food or Liquids, indigestion/reflux, choking or coughing while eating, abdominal pain with or after eating No Cough, palpitations, orthopnea or DOE No Abdominal pain, N/V, melena,hematochezia, dark tarry stools, constipation No dysuria, malodorous urine, hematuria or flank pain No new skin rashes, lesions, masses or bruises, No new joint pains, aches, swelling or redness No recent unintentional weight gain or loss No polyuria, polydypsia or polyphagia   Past Medical History:  Diagnosis Date  . Abdominal aortic atherosclerosis ([place]) 07/2015   by CT  . Abnormal drug screen 12/2013   abnormally neg xanax (12/2013) and neg xanax and positive EtOH (04/2014), appropriate (08/2014)  . Allergic rhinitis   . Anxiety   . CKD (chronic kidney disease) stage 4, GFR 15-29 ml/min (HCC) 12/2012   per pt after brown recluse bite. sees Dr Juleen China, off ACEI, bp ok slightly elevated to maintain renal perfusion  . 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 DDD (degenerative disc disease), lumbar   . Depression   . Diastolic dysfunction 3244   per  echo in 11/2010 and again 03/2012; normal EF  . Diverticulosis    colonsocopy 2002 aborted 2/2 tortuous colon and heavy sigmoid diverticulosis  . Dysphagia    EGD 2012 - wnl, s/p esoph dilation  . GERD (gastroesophageal reflux disease)   . Glucose intolerance (impaired glucose  tolerance)   . History of CVA (cerebrovascular accident) 2012   old per prior head CT  . History of diverticulitis of colon   . HLD (hyperlipidemia)   . HTN (hypertension)    Dr. Martinique, cards  . IBS (irritable bowel syndrome)   . Migraine   . Osteoporosis 08/2013   Spine -2.2, hip -3.5  . Rib fractures 10/11/2014  . Right upper lobe pneumonia ([place]) 03/25/2014  . SUI (stress urinary incontinence, female)    Dr. Amalia Hailey, urology (some urge as well)    Past Surgical History:  Procedure Laterality Date  . BREAST ENHANCEMENT SURGERY  1982  . COLONOSCOPY  2002   does not want another! severe diverticulosis with luminal narrowing and spasm Deatra Ina)  . CT HEAD LIMITED W/O CM  04/2010   nothing acute, ? old infarcts L internal capsule  . DEXA  08/2013   Spine -2.2, hip -3.5  . ESOPHAGOGASTRODUODENOSCOPY  2012   WNL, s/p esoph dilation (Dr. Candace Cruise, [facility])  . macroplastique implantation  03/2008   Dr. Rogers Blocker  . renal doppler  2009?   simple cysts, wnl  . submucous resection  1962  . TONSILLECTOMY  1942  . TOTAL ABDOMINAL HYSTERECTOMY  1996   for frequent dysmennorhea  . TUBAL LIGATION  1973  . uretherolysis and removal of macroplastique  09/13/08   Dr. Amalia Hailey  . US ECHOCARDIOGRAPHY  11/2010   EF 55-60%, mild LAE, mild diastolic dysfunction, normal valves    Social History   Social History  . Marital status: Widowed    Spouse name: N/A  . Number of children: 3  . Years of education: N/A   Occupational History  . Former Personal assistant    Social History Main Topics  . Smoking status: Former Smoker    Packs/day: 0.50    Years: 54.00    Types: Cigarettes    Start date: 01/18/1954    Quit date: 01/19/2008  . Smokeless tobacco: Never Used  . Alcohol use 0.0 oz/week     Comment: 1 1/2-2 glasses white wine/day  . Drug use: No  . Sexual activity: Not Currently   Other Topics Concern  . Not on file   Social History Narrative  . No narrative on file    Mobility: Independent Work  history: Not obtained   Allergies  Allergen Reactions  . Amlodipine Other (See Comments)    edema  . Doxycycline Monohydrate Nausea And Vomiting  . Ramipril Other (See Comments)     Worsening renal function    Family History  Problem Relation Age of Onset  . Heart failure Father   . Heart disease Father   . Cancer Father        prostate  . Hypertension Father   . Coronary artery disease Father   . Leukemia Mother        CLL prior  . Breast cancer Maternal Aunt   . Breast cancer Paternal Aunt      Prior to Admission medications   Medication Sig Start Date End Date Taking? Authorizing Provider  acetaminophen (TYLENOL) 500 MG tablet Take 1,000 mg by mouth daily as needed (pain).    Yes [provider]  aspirin  EC 81 MG tablet Take 81 mg by mouth daily.   Yes [provider]  bisoprolol (ZEBETA) 5 MG tablet Take 1 tablet (5 mg total) by mouth daily. 03/23/16  Yes Martinique, Peter M, MD  cholecalciferol (VITAMIN D) 1000 UNITS tablet Take 1,000 Units by mouth daily. Reported on 05/20/2015   Yes [provider]  furosemide (LASIX) 20 MG tablet TAKE 1 & 1/2 TABLETS BY MOUTH EVERY DAY 05/24/16  Yes Ria Bush, MD  gabapentin (NEURONTIN) 100 MG capsule Take 1 capsule (100 mg total) by mouth 2 (two) times daily. 10/16/15  Yes Ria Bush, MD  Oyster Shell (OYSTER CALCIUM) 500 MG TABS tablet Take 500 mg of elemental calcium by mouth daily. Reported on 05/20/2015   Yes [provider]  Probiotic Product (VSL#3) CAPS Take 1 capsule by mouth daily. 03/22/16  Yes Mauri Pole, MD  sertraline (ZOLOFT) 50 MG tablet TAKE 1 TABLET BY MOUTH EVERY DAY 04/01/16  Yes Ria Bush, MD  traMADol (ULTRAM) 50 MG tablet TAKE 1 TABLET TWICE A DAY AS NEEDED 05/14/16  Yes Ria Bush, MD  calcitRIOL (ROCALTROL) 0.25 MCG capsule Take 0.25 mcg by mouth 3 (three) times a week.     [provider]  diclofenac sodium (VOLTAREN) 1 % GEL Apply 1  application topically 3 (three) times daily. 04/02/16   Ria Bush, MD    Physical Exam: Vitals:   05/30/16 1215 05/30/16 1230 05/30/16 1245 05/30/16 1315  BP: (!) 181/78 97/64 (!) 130/104 (!) 170/75  Pulse: (!) 56 (!) 58 (!) 58 60  Resp: (!) 22 17 17 18   Temp:      TempSrc:      SpO2: 97% 99% 98% 98%  Weight:      Height:          Constitutional: NAD, calm, comfortable-Appears much younger than stated age Eyes: PERRL, lids and conjunctivae normal ENMT: Mucous membranes are moist. Posterior pharynx clear of any exudate or lesions.Normal dentition.  Neck: normal, supple, no masses, no thyromegaly Respiratory: clear to auscultation bilaterally, no wheezing, no crackles. Normal respiratory effort. No accessory muscle use. There is very slight chest wall pain reproducible with palpation Cardiovascular: Regular somewhat bradycardic rate and rhythm, no murmurs / rubs / gallops. No extremity edema. 2+ pedal pulses. No carotid bruits. No visible or palpable veins/cording in lower extremities or in upper extremities Abdomen: no tenderness, no masses palpated. No hepatosplenomegaly. Bowel sounds positive.  Musculoskeletal: no clubbing / cyanosis. No joint deformity upper and lower extremities. Good ROM, no contractures. Normal muscle tone.  Skin: no rashes, ulcers. No induration. She has a solitary less than 1 cm reddened nodule on the right lateral leg consistent with report of recent insect bite Neurologic: CN 2-12 grossly intact. Sensation intact, DTR normal. Strength 5/5 x all 4 extremities.  Psychiatric: Normal judgment and insight. Alert and oriented x 3. Normal mood.    Labs on Admission: I have personally reviewed following labs and imaging studies  CBC:  Recent Labs Lab 05/30/16 1100  WBC 8.0  HGB 12.2  HCT 38.5  MCV 100.0  PLT 151   Basic Metabolic Panel:  Recent Labs Lab 05/30/16 1100  NA 137  K 5.2*  CL 107  CO2 22  GLUCOSE 105*  BUN 28*  CREATININE  2.11*  CALCIUM 9.8   GFR: Estimated Creatinine Clearance: 16.3 mL/min (A) (by C-G formula based on SCr of 2.11 mg/dL (H)). Liver Function Tests: No results for input(s): AST, ALT, ALKPHOS, BILITOT, PROT,  ALBUMIN in the last 168 hours. No results for input(s): LIPASE, AMYLASE in the last 168 hours. No results for input(s): AMMONIA in the last 168 hours. Coagulation Profile: No results for input(s): INR, PROTIME in the last 168 hours. Cardiac Enzymes: No results for input(s): CKTOTAL, CKMB, CKMBINDEX, TROPONINI in the last 168 hours. BNP (last 3 results) No results for input(s): PROBNP in the last 8760 hours. HbA1C: No results for input(s): HGBA1C in the last 72 hours. CBG: No results for input(s): GLUCAP in the last 168 hours. Lipid Profile: No results for input(s): CHOL, HDL, LDLCALC, TRIG, CHOLHDL, LDLDIRECT in the last 72 hours. Thyroid Function Tests: No results for input(s): TSH, T4TOTAL, FREET4, T3FREE, THYROIDAB in the last 72 hours. Anemia Panel: No results for input(s): VITAMINB12, FOLATE, FERRITIN, TIBC, IRON, RETICCTPCT in the last 72 hours. Urine analysis:    Component Value Date/Time   COLORURINE YELLOW 10/15/2014 [place] 10/15/2014 1349   LABSPEC 1.011 10/15/2014 1349   PHURINE 5.0 10/15/2014 1349   GLUCOSEU NEGATIVE 10/15/2014 1349   HGBUR TRACE (A) 10/15/2014 1349   HGBUR small 09/25/2008 1224   BILIRUBINUR Negative 05/20/2015 1507   KETONESUR NEGATIVE 10/15/2014 1349   PROTEINUR Trace 05/20/2015 1507   PROTEINUR NEGATIVE 10/15/2014 1349   UROBILINOGEN 0.2 05/20/2015 1507   UROBILINOGEN 0.2 10/15/2014 1349   NITRITE Negative 05/20/2015 1507   NITRITE NEGATIVE 10/15/2014 1349   LEUKOCYTESUR Negative 05/20/2015 1507   Sepsis Labs: @LABRCNTIP (procalcitonin:4,lacticidven:4) )No results found for this or any previous visit (from the past 240 hour(s)).   Radiological Exams on Admission: Dg Chest 2 View  Result Date: 05/30/2016 CLINICAL  DATA:  Chest pain EXAM: CHEST  2 VIEW COMPARISON:  10/15/2014 FINDINGS: Heart size within normal limits. Atherosclerotic aorta. Negative for heart failure. Lungs are clear without infiltrate or effusion. COPD with pulmonary hyperinflation. IMPRESSION: No active cardiopulmonary disease. Electronically Signed   By: Franchot Gallo M.D.   On: 05/30/2016 11:30    EKG: (Independently reviewed) sinus bradycardia with ventricular rate 56 bpm, QTC 390 ms, peaked T waves, no definitive ST segment elevation  Assessment/Plan Principal Problem:   Chest pain -Presents with right-sided chest pain with a sensation of shortness of breath without other associated symptoms which is somewhat atypical in nature and partially reproducible with palpation of her anterior chest wall bilaterally -Initial EKG and troponin reassuring -Cycle troponin -Echocardiogram -Continue preadmission beta blocker, baby aspirin -Not on statin prior to admission; lipid panel February 2018: cholesterol 164, HDL 52, LDL 84 and triglycerides 138  Active Problems:   Positive D dimer -Mildly elevated at 0.67 -Wells criteria score -2 with a pretest probability for DVT low and prevalence 3% therefore suspect elevation in d-dimer reflective of low GFR and severity of chronic kidney disease -No indication at this juncture to pursue imaging regarding PE; unable to obtain CT angiogram of chest due to CKD and would like to avoid VQ scan if possible due to possibility of indeterminate findings-history of diverticular bleed would like to avoid unnecessary anticoagulation if possible    Uncontrolled hypertension -Likely contributing to chest pressure/pain -Continue preadmission beta blocker but given bradycardia cannot increase dosage further -No ACE inhibitor or ARB secondary to CKD -Begin scheduled hydralazine 50 mg every 8 hours with prn IV hydralazine for breakthrough hypertension    CKD (chronic kidney disease), stage IV / Acute  hyperkalemia -Renal function is stable and at baseline -Presents with hyperkalemia with elevated T waves so we'll hold Lasix and give gentle IV  fluid hydration with saline at 50/hr -BMET in am    Chronic diastolic heart failure, NYHA class 1  -Chest x-ray negative -Hold Lasix -Follow-up on echo; last echocardiogram completed in 4010 with diastolic dysfunction noted about any LVH and preserved EF and no valvular disorders    Sinus bradycardia -Chronic issue in setting of utilization of beta blocker -Would avoid further dose increases of beta blocker at this juncture    History of CVA (cerebrovascular accident) -Continue aspirin -Not on statin      DVT prophylaxis: Lovenox  Code Status: Full Family Communication: No family at bedside  Disposition Plan: Home Consults called: None    ELLIS,ALLISON L. ANP-BC [facility] Pager [phone]   If 7PM-7AM, please contact night-coverage [email or web address] Password [email or web address]  05/30/2016, 2:15 PM

## 2016-05-30 NOTE — ED Triage Notes (Signed)
Patient complains of shortness of breath and chest pressure since last pm. States that she feels as if she cant get a good breath. On arrival alert and oriented, states that the pain is worse on the right

## 2016-05-31 ENCOUNTER — Observation Stay (HOSPITAL_BASED_OUTPATIENT_CLINIC_OR_DEPARTMENT_OTHER): Payer: Medicare Other

## 2016-05-31 ENCOUNTER — Observation Stay (HOSPITAL_COMMUNITY): Payer: Medicare Other

## 2016-05-31 DIAGNOSIS — K589 Irritable bowel syndrome without diarrhea: Secondary | ICD-10-CM | POA: Diagnosis present

## 2016-05-31 DIAGNOSIS — I5040 Unspecified combined systolic (congestive) and diastolic (congestive) heart failure: Secondary | ICD-10-CM | POA: Diagnosis not present

## 2016-05-31 DIAGNOSIS — I5032 Chronic diastolic (congestive) heart failure: Secondary | ICD-10-CM

## 2016-05-31 DIAGNOSIS — R079 Chest pain, unspecified: Secondary | ICD-10-CM | POA: Diagnosis not present

## 2016-05-31 DIAGNOSIS — N184 Chronic kidney disease, stage 4 (severe): Secondary | ICD-10-CM | POA: Diagnosis not present

## 2016-05-31 DIAGNOSIS — Z794 Long term (current) use of insulin: Secondary | ICD-10-CM | POA: Diagnosis not present

## 2016-05-31 DIAGNOSIS — R001 Bradycardia, unspecified: Secondary | ICD-10-CM

## 2016-05-31 DIAGNOSIS — J449 Chronic obstructive pulmonary disease, unspecified: Secondary | ICD-10-CM | POA: Diagnosis present

## 2016-05-31 DIAGNOSIS — E871 Hypo-osmolality and hyponatremia: Secondary | ICD-10-CM | POA: Diagnosis not present

## 2016-05-31 DIAGNOSIS — Z7952 Long term (current) use of systemic steroids: Secondary | ICD-10-CM | POA: Diagnosis not present

## 2016-05-31 DIAGNOSIS — R7989 Other specified abnormal findings of blood chemistry: Secondary | ICD-10-CM | POA: Diagnosis not present

## 2016-05-31 DIAGNOSIS — I10 Essential (primary) hypertension: Secondary | ICD-10-CM

## 2016-05-31 DIAGNOSIS — R072 Precordial pain: Secondary | ICD-10-CM

## 2016-05-31 DIAGNOSIS — I130 Hypertensive heart and chronic kidney disease with heart failure and stage 1 through stage 4 chronic kidney disease, or unspecified chronic kidney disease: Secondary | ICD-10-CM | POA: Diagnosis present

## 2016-05-31 DIAGNOSIS — Z8673 Personal history of transient ischemic attack (TIA), and cerebral infarction without residual deficits: Secondary | ICD-10-CM | POA: Diagnosis not present

## 2016-05-31 DIAGNOSIS — Z881 Allergy status to other antibiotic agents status: Secondary | ICD-10-CM | POA: Diagnosis not present

## 2016-05-31 DIAGNOSIS — I5033 Acute on chronic diastolic (congestive) heart failure: Secondary | ICD-10-CM | POA: Diagnosis not present

## 2016-05-31 DIAGNOSIS — F419 Anxiety disorder, unspecified: Secondary | ICD-10-CM | POA: Diagnosis present

## 2016-05-31 DIAGNOSIS — E875 Hyperkalemia: Secondary | ICD-10-CM | POA: Diagnosis not present

## 2016-05-31 DIAGNOSIS — F329 Major depressive disorder, single episode, unspecified: Secondary | ICD-10-CM | POA: Diagnosis present

## 2016-05-31 DIAGNOSIS — G43909 Migraine, unspecified, not intractable, without status migrainosus: Secondary | ICD-10-CM | POA: Diagnosis present

## 2016-05-31 DIAGNOSIS — M810 Age-related osteoporosis without current pathological fracture: Secondary | ICD-10-CM | POA: Diagnosis present

## 2016-05-31 DIAGNOSIS — R0602 Shortness of breath: Secondary | ICD-10-CM | POA: Diagnosis not present

## 2016-05-31 DIAGNOSIS — J441 Chronic obstructive pulmonary disease with (acute) exacerbation: Secondary | ICD-10-CM | POA: Diagnosis not present

## 2016-05-31 DIAGNOSIS — Z87891 Personal history of nicotine dependence: Secondary | ICD-10-CM | POA: Diagnosis not present

## 2016-05-31 DIAGNOSIS — R42 Dizziness and giddiness: Secondary | ICD-10-CM | POA: Diagnosis not present

## 2016-05-31 DIAGNOSIS — Z7982 Long term (current) use of aspirin: Secondary | ICD-10-CM | POA: Diagnosis not present

## 2016-05-31 LAB — BASIC METABOLIC PANEL
ANION GAP: 10 (ref 5–15)
BUN: 28 mg/dL — ABNORMAL HIGH (ref 6–20)
CALCIUM: 9.5 mg/dL (ref 8.9–10.3)
CO2: 21 mmol/L — AB (ref 22–32)
Chloride: 105 mmol/L (ref 101–111)
Creatinine, Ser: 2.04 mg/dL — ABNORMAL HIGH (ref 0.44–1.00)
GFR, EST AFRICAN AMERICAN: 25 mL/min — AB (ref >60)
GFR, EST NON AFRICAN AMERICAN: 22 mL/min — AB (ref >60)
GLUCOSE: 129 mg/dL — AB (ref 65–99)
POTASSIUM: 4.2 mmol/L (ref 3.5–5.1)
Sodium: 136 mmol/L (ref 135–145)

## 2016-05-31 LAB — ECHOCARDIOGRAM COMPLETE
HEIGHTINCHES: 62 in
WEIGHTICAEL: 1748.8 [oz_av]

## 2016-05-31 LAB — TROPONIN I: Troponin I: <0.03 ng/mL (ref <0.03)

## 2016-05-31 MED ORDER — ALBUTEROL SULFATE (2.5 MG/3ML) 0.083% IN NEBU: 2.5000 mg | INHALATION_SOLUTION | RESPIRATORY_TRACT | Status: DC | PRN

## 2016-05-31 MED ORDER — TECHNETIUM TO 99M ALBUMIN AGGREGATED
4.1700 | Freq: Once | INTRAVENOUS | Status: AC | PRN
  Administered 2016-05-31: 4.17 via INTRAVENOUS

## 2016-05-31 MED ORDER — SERTRALINE HCL 50 MG PO TABS
50.0000 mg | ORAL_TABLET | Freq: Every day | ORAL | Status: DC
  Administered 2016-05-31 – 2016-06-01 (×2): 50 mg via ORAL
  Filled 2016-05-31 (×2): qty 1

## 2016-05-31 MED ORDER — IPRATROPIUM-ALBUTEROL 0.5-2.5 (3) MG/3ML IN SOLN
3.0000 mL | Freq: Four times a day (QID) | RESPIRATORY_TRACT | Status: DC
  Administered 2016-05-31 (×2): 3 mL via RESPIRATORY_TRACT
  Filled 2016-05-31 (×2): qty 3

## 2016-05-31 MED ORDER — POTASSIUM CHLORIDE CRYS ER 20 MEQ PO TBCR: 20.0000 meq | EXTENDED_RELEASE_TABLET | Freq: Once | ORAL | Status: DC

## 2016-05-31 MED ORDER — METHYLPREDNISOLONE SODIUM SUCC 40 MG IJ SOLR
40.0000 mg | Freq: Four times a day (QID) | INTRAMUSCULAR | Status: DC
  Administered 2016-05-31 – 2016-06-02 (×8): 40 mg via INTRAVENOUS
  Filled 2016-05-31 (×8): qty 1

## 2016-05-31 MED ORDER — TECHNETIUM TC 99M DIETHYLENETRIAME-PENTAACETIC ACID
32.8000 | Freq: Once | INTRAVENOUS | Status: AC | PRN
  Administered 2016-05-31: 32.8 via RESPIRATORY_TRACT

## 2016-05-31 NOTE — Progress Notes (Signed)
PROGRESS NOTE    Nicole English  VEL:[id number] DOB: 1932/09/13 DOA: 05/30/2016 PCP: Ria Bush, MD   Outpatient Specialists:     Brief Narrative:  Nicole English is a 83 y.o. female with medical history significant for hypertension, chronic diastolic dysfunction. Chronic kidney disease stage IV on Lasix, DDD, osteoporosis, COPD and chronic drug related bradycardia. She has a prior history of CVA and diverticular bleed. Patient reports beginning yesterday she developed onset of pressure-like right-sided chest pain underneath the right breast that has been constant in nature and she reports this makes it difficult to catch her breath although she denies pleuritic type symptoms. No diaphoresis. No GI symptoms. She's not had any lower extremity edema. She is not taking any long trips by automobile or plane. In the ER she was not hypoxemic. She had a bradycardic rate which is chronic on beta blockers. Her d-dimer was mildly elevated at 0.67. She's had some ongoing issues with bilateral hip pain and has been followed by her primary care physician but has not been bedbound because of this. She recently had an insect bite to her right lateral lower leg without any significant redness, edema, or any constitutional symptoms.   Assessment & Plan:   Principal Problem:   Chest pain Active Problems:   Positive D dimer   CKD (chronic kidney disease), stage IV (HCC)   History of CVA (cerebrovascular accident)   Chronic diastolic heart failure, NYHA class 1 (HCC)   Acute hyperkalemia   Uncontrolled hypertension   Sinus bradycardia   Chest pain -cardiology consult -echo pending    Positive D dimer -Mildly elevated at 0.67 -V/Q scan ordered  COPD -steroids -nebs    Uncontrolled hypertension -Likely contributing to chest pressure/pain -Continue preadmission beta blocker but given bradycardia cannot increase dosage further -No ACE inhibitor or ARB secondary to CKD -Begin scheduled  hydralazine 50 mg every 8 hours with prn IV hydralazine for breakthrough hypertension    CKD (chronic kidney disease), stage IV / Acute hyperkalemia -Renal function is stable and at baseline -hyperkalemia resolved    Chronic diastolic heart failure, NYHA class 1  -Chest x-ray negative -check echo; last echocardiogram completed in 2585 with diastolic dysfunction noted about any LVH and preserved EF and no valvular disorders    Sinus bradycardia -Chronic issue in setting of utilization of beta blocker    History of CVA (cerebrovascular accident) -Continue aspirin -Not on statin      DVT prophylaxis:  Lovenox  Code Status: Full Code   Family Communication:   Disposition Plan:     Consultants:       Subjective: Short of breath  Objective: Vitals:   05/31/16 0041 05/31/16 0450 05/31/16 0751 05/31/16 1025  BP: (!) 160/98 (!) 160/72 (!) 188/93 (!) 156/71  Pulse: 68 93 89 74  Resp:      Temp:  98.2 F (36.8 C)    TempSrc:      SpO2: 95% 94% 95% 100%  Weight:  49.6 kg (109 lb 4.8 oz)    Height:        Intake/Output Summary (Last 24 hours) at 05/31/16 1311 Last data filed at 05/31/16 0921  Gross per 24 hour  Intake             1020 ml  Output              200 ml  Net              820 ml  Filed Weights   05/30/16 1046 05/31/16 0450  Weight: 51.3 kg (113 lb) 49.6 kg (109 lb 4.8 oz)    Examination:  General exam: uncomfortable appearing Respiratory system: not moving much air Cardiovascular system: S1 & S2 heard, RRR. No JVD, murmurs, rubs, gallops or clicks. No pedal edema. Gastrointestinal system: Abdomen is nondistended, soft and nontender. No organomegaly or masses felt. Normal bowel sounds heard. Central nervous system: Alert and oriented. No focal neurological deficits. Extremities: Symmetric 5 x 5 power. Skin: No rashes, lesions or ulcers Psychiatry: Judgement and insight appear normal. Mood & affect appropriate.     Data Reviewed: I  have personally reviewed following labs and imaging studies  CBC:  Recent Labs Lab 05/30/16 1100  WBC 8.0  HGB 12.2  HCT 38.5  MCV 100.0  PLT 119   Basic Metabolic Panel:  Recent Labs Lab 05/30/16 1100 05/31/16 1132  NA 137 136  K 5.2* 4.2  CL 107 105  CO2 22 21*  GLUCOSE 105* 129*  BUN 28* 28*  CREATININE 2.11* 2.04*  CALCIUM 9.8 9.5   GFR: Estimated Creatinine Clearance: 16.6 mL/min (A) (by C-G formula based on SCr of 2.04 mg/dL (H)). Liver Function Tests: No results for input(s): AST, ALT, ALKPHOS, BILITOT, PROT, ALBUMIN in the last 168 hours. No results for input(s): LIPASE, AMYLASE in the last 168 hours. No results for input(s): AMMONIA in the last 168 hours. Coagulation Profile: No results for input(s): INR, PROTIME in the last 168 hours. Cardiac Enzymes:  Recent Labs Lab 05/30/16 1430 05/30/16 1955 05/31/16 0202  CKTOTAL 30*  --   --   TROPONINI <0.03 <0.03 <0.03   BNP (last 3 results) No results for input(s): PROBNP in the last 8760 hours. HbA1C: No results for input(s): HGBA1C in the last 72 hours. CBG: No results for input(s): GLUCAP in the last 168 hours. Lipid Profile: No results for input(s): CHOL, HDL, LDLCALC, TRIG, CHOLHDL, LDLDIRECT in the last 72 hours. Thyroid Function Tests:  Recent Labs  05/30/16 1430  TSH 2.394   Anemia Panel: No results for input(s): VITAMINB12, FOLATE, FERRITIN, TIBC, IRON, RETICCTPCT in the last 72 hours. Urine analysis:    Component Value Date/Time   COLORURINE YELLOW 10/15/2014 [place] 10/15/2014 1349   LABSPEC 1.011 10/15/2014 1349   PHURINE 5.0 10/15/2014 1349   GLUCOSEU NEGATIVE 10/15/2014 1349   HGBUR TRACE (A) 10/15/2014 1349   HGBUR small 09/25/2008 1224   BILIRUBINUR Negative 05/20/2015 1507   KETONESUR NEGATIVE 10/15/2014 1349   PROTEINUR Trace 05/20/2015 1507   PROTEINUR NEGATIVE 10/15/2014 1349   UROBILINOGEN 0.2 05/20/2015 1507   UROBILINOGEN 0.2 10/15/2014 1349    NITRITE Negative 05/20/2015 1507   NITRITE NEGATIVE 10/15/2014 1349   LEUKOCYTESUR Negative 05/20/2015 1507     )No results found for this or any previous visit (from the past 240 hour(s)).    Anti-infectives    None       Radiology Studies: Dg Chest 2 View  Result Date: 05/30/2016 CLINICAL DATA:  Chest pain EXAM: CHEST  2 VIEW COMPARISON:  10/15/2014 FINDINGS: Heart size within normal limits. Atherosclerotic aorta. Negative for heart failure. Lungs are clear without infiltrate or effusion. COPD with pulmonary hyperinflation. IMPRESSION: No active cardiopulmonary disease. Electronically Signed   By: Franchot Gallo M.D.   On: 05/30/2016 11:30        Scheduled Meds: . aspirin EC  81 mg Oral Daily  . bisoprolol  5 mg Oral Daily  . calcitRIOL  0.25 mcg Oral Once per day on Mon Wed Fri  . cholecalciferol  1,000 Units Oral Daily  . enoxaparin (LOVENOX) injection  40 mg Subcutaneous Q24H  . gabapentin  100 mg Oral BID  . hydrALAZINE  50 mg Oral Q8H  . ipratropium-albuterol  3 mL Nebulization Q6H  . methylPREDNISolone (SOLU-MEDROL) injection  40 mg Intravenous Q6H  . oyster calcium  500 mg of elemental calcium Oral Q breakfast  . sertraline  50 mg Oral QHS   Continuous Infusions:   LOS: 0 days    Time spent:  25 min    [place], DO [facility] Pager [phone]  If 7PM-7AM, please contact night-coverage [email or web address] Password [email or web address] 05/31/2016, 1:11 PM

## 2016-05-31 NOTE — Consult Note (Signed)
Cardiology Consult    Patient ID: Nicole English MRN: [id number], DOB/AGE: [birth year]   Admit date: 05/30/2016 Date of Consult: 05/31/2016  Primary Physician: Ria Bush, MD Reason for Consult: Chest pain Primary Cardiologist: Dr. Martinique Requesting Provider: Dr. Eliseo Squires  History of Present Illness    Nicole English is a 83 y.o. female who is being seen today for the evaluation of Chest pain at the request of Dr. Eliseo Squires. The patient has a past medical history significant for hypertension, diastolic heart failure, CKD stage IV on lasix, COPD, osteoporosis, chronic drug related bradycardia, CVA, diverticular bleed. The patient developed sudden onset of right chest pain and dyspnea. She has never experienced this before. The chest pain gradually subsided without intervention, but dyspnea continues. She has a clear chest xray, normal EKG, negative troponins, elevated BP. She has an elevated D-dimer but unable to have CT of the chest due to renal function. She has had a diverticular bleed in the past and may not be a candidate for anticoagulation. She has had no leg pain or edema.  Prior to this episode the patient was in her usual state of health. She live on the second floor with her son and is usually able to climb the stairs without difficulty. She denies leg pain or edema.    She was last seen by Dr. Martinique on 03/09/2016 at which time she was doing well with well compensated chronic diastolic heart failure. Last Myoview at [facility] 04/2010 with no ischemia and EF of 90%. Echo 03/2012 with EF 60 to 94%, diastolic dysfunction, MAC.  Past Medical History   Past Medical History:  Diagnosis Date  . Abdominal aortic atherosclerosis ([place]) 07/2015   by CT  . Abnormal drug screen 12/2013   abnormally neg xanax (12/2013) and neg xanax and positive EtOH (04/2014), appropriate (08/2014)  . Allergic rhinitis   . Anxiety   . CKD (chronic kidney disease) stage 4, GFR 15-29 ml/min (HCC) 12/2012   per pt  after brown recluse bite. sees Dr Juleen China, off ACEI, bp ok slightly elevated to maintain renal perfusion  . 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 DDD (degenerative disc disease), lumbar   . Depression   . Diastolic dysfunction 7654   per echo in 11/2010 and again 03/2012; normal EF  . Diverticulosis    colonsocopy 2002 aborted 2/2 tortuous colon and heavy sigmoid diverticulosis  . Dysphagia    EGD 2012 - wnl, s/p esoph dilation  . GERD (gastroesophageal reflux disease)   . Glucose intolerance (impaired glucose tolerance)   . History of CVA (cerebrovascular accident) 2012   old per prior head CT  . History of diverticulitis of colon   . HLD (hyperlipidemia)   . HTN (hypertension)    Dr. Martinique, cards  . IBS (irritable bowel syndrome)   . Migraine   . Osteoporosis 08/2013   Spine -2.2, hip -3.5  . Rib fractures 10/11/2014  . Right upper lobe pneumonia ([place]) 03/25/2014  . SUI (stress urinary incontinence, female)    Dr. Amalia Hailey, urology (some urge as well)    Past Surgical History:  Procedure Laterality Date  . BREAST ENHANCEMENT SURGERY  1982  . COLONOSCOPY  2002   does not want another! severe diverticulosis with luminal narrowing and spasm Deatra Ina)  . CT HEAD LIMITED W/O CM  04/2010   nothing acute, ? old infarcts L internal capsule  . DEXA  08/2013   Spine -2.2, hip -3.5  . ESOPHAGOGASTRODUODENOSCOPY  2012   WNL, s/p esoph dilation (Dr. Candace Cruise, [facility])  . macroplastique implantation  03/2008   Dr. Rogers Blocker  . renal doppler  2009?   simple cysts, wnl  . submucous resection  1962  . TONSILLECTOMY  1942  . TOTAL ABDOMINAL HYSTERECTOMY  1996   for frequent dysmennorhea  . TUBAL LIGATION  1973  . uretherolysis and removal of macroplastique  09/13/08   Dr. Amalia Hailey  . US ECHOCARDIOGRAPHY  11/2010   EF 55-60%, mild LAE, mild diastolic dysfunction, normal valves     Allergies  Allergies  Allergen Reactions    . Amlodipine Other (See Comments)    edema  . Doxycycline Monohydrate Nausea And Vomiting  . Ramipril Other (See Comments)     Worsening renal function    Inpatient Medications    . aspirin EC  81 mg Oral Daily  . bisoprolol  5 mg Oral Daily  . calcitRIOL  0.25 mcg Oral Once per day on Mon Wed Fri  . cholecalciferol  1,000 Units Oral Daily  . enoxaparin (LOVENOX) injection  40 mg Subcutaneous Q24H  . gabapentin  100 mg Oral BID  . hydrALAZINE  50 mg Oral Q8H  . oyster calcium  500 mg of elemental calcium Oral Q breakfast  . sertraline  50 mg Oral Daily    Family History    Family History  Problem Relation Age of Onset  . Heart failure Father   . Heart disease Father   . Cancer Father        prostate  . Hypertension Father   . Coronary artery disease Father   . Leukemia Mother        CLL prior  . Breast cancer Maternal Aunt   . Breast cancer Paternal Aunt      Social History    Social History   Social History  . Marital status: Widowed    Spouse name: N/A  . Number of children: 3  . Years of education: N/A   Occupational History  . Former Personal assistant    Social History Main Topics  . Smoking status: Former Smoker    Packs/day: 0.50    Years: 54.00    Types: Cigarettes    Start date: 01/18/1954    Quit date: 01/19/2008  . Smokeless tobacco: Never Used  . Alcohol use 0.0 oz/week     Comment: 1 1/2-2 glasses white wine/day  . Drug use: No  . Sexual activity: Not Currently   Other Topics Concern  . Not on file   Social History Narrative  . No narrative on file     Review of Systems    General:  No chills, fever, night sweats or weight changes.  Cardiovascular:  Right sided chest pain as above, dyspnea since last evening No previous dyspnea on exertion, edema, orthopnea, palpitations, paroxysmal nocturnal dyspnea. Dermatological: No rash, lesions/masses Respiratory: No cough, Positive for dyspnea Urologic: No hematuria, dysuria Abdominal:   No  nausea, vomiting, diarrhea, bright red blood per rectum, melena, or hematemesis Neurologic:  No visual changes, wkns, changes in mental status. All other systems reviewed and are otherwise negative except as noted above.  Physical Exam    Blood pressure (!) 188/93, pulse 89, temperature 98.2 F (36.8 C), resp. rate 18, height 5' 2"  (1.575 m), weight 109 lb 4.8 oz (49.6 kg), SpO2 95 %.  General: Pleasant, NAD Psych: Normal affect. Neuro: Alert and oriented X 3.  Moves all extremities spontaneously. HEENT: Normal  Neck: Supple without bruits or JVD. Lungs:  Respirations mildly labored at rest, pt sitting straight up, diminished lung sounds with few wheezes Heart: RRR no s3, s4, or murmurs. Abdomen: Soft, non-tender, non-distended, BS + x 4.  Extremities: No clubbing, cyanosis or edema. DP/PT/Radials 2+ and equal bilaterally.  Labs    Troponin [facility] of Care Test)  Recent Labs  05/30/16 1107  TROPIPOC 0.00    Recent Labs  05/30/16 1430 05/30/16 1955 05/31/16 0202  CKTOTAL 30*  --   --   TROPONINI <0.03 <0.03 <0.03   Lab Results  Component Value Date   WBC 8.0 05/30/2016   HGB 12.2 05/30/2016   HCT 38.5 05/30/2016   MCV 100.0 05/30/2016   PLT 271 05/30/2016    Recent Labs Lab 05/30/16 1100  NA 137  K 5.2*  CL 107  CO2 22  BUN 28*  CREATININE 2.11*  CALCIUM 9.8  GLUCOSE 105*   Lab Results  Component Value Date   CHOL 164 03/08/2016   HDL 52.30 03/08/2016   LDLCALC 84 03/08/2016   TRIG 138.0 03/08/2016   Lab Results  Component Value Date   DDIMER 0.67 (H) 05/30/2016     Radiology Studies    Dg Chest 2 View  Result Date: 05/30/2016 CLINICAL DATA:  Chest pain EXAM: CHEST  2 VIEW COMPARISON:  10/15/2014 FINDINGS: Heart size within normal limits. Atherosclerotic aorta. Negative for heart failure. Lungs are clear without infiltrate or effusion. COPD with pulmonary hyperinflation. IMPRESSION: No active cardiopulmonary disease. Electronically Signed   By:  Franchot Gallo M.D.   On: 05/30/2016 11:30   Mm Screening Breast W/implant Tomo Bilateral  Result Date: 05/26/2016 CLINICAL DATA:  Screening. EXAM: 2D DIGITAL SCREENING BILATERAL MAMMOGRAM WITH IMPLANTS, CAD AND ADJUNCT TOMO The patient has implants. Standard and implant displaced views were performed. COMPARISON:  Previous exam(s). ACR Breast Density Category b: There are scattered areas of fibroglandular density. FINDINGS: There are no findings suspicious for malignancy. Images were processed with CAD. IMPRESSION: No mammographic evidence of malignancy. A result letter of this screening mammogram will be mailed directly to the patient. RECOMMENDATION: Screening mammogram in one year. (Code:SM-B-01Y) BI-RADS CATEGORY  2: Benign. Electronically Signed   By: Abelardo Diesel M.D.   On: 05/26/2016 15:41    EKG & Cardiac Imaging    EKG: Sinus bradycharia 56 bpm, QTC 393  Echocardiogram: Pending  Assessment & Plan    Chest pain -Troponins negative for 4 occurrences -EKG without ischemic changes -Elevated D-dimer. Unable to do CT due to kidney function. Would proceed with VQ scan with the current acute dyspnea. Pt has history of diverticular bleed and may not be an anticoagulation candidate, but would be beneficial to know if has PE. -No objective evidence of acute myocardial ischemia. CKD. No invasive cardiac studies indicated at present.   Chronic diastolic heart failure -EF 60-65% LV, findings consistent with LV diastolic dysfunction in 06/9627 -No edema or rales, lung sound diminished with occ tight wheeze -CXR showed no active cardiopulmonary disease -No overt fluid overload -Check echo for LV function  Dyspnea -CXR without acute findings -Lungs sounds tight, poor air movement -advise to get VQ scan -advise trial of bronchodilator (nebs). Management per IM.   CKD stage IV -SCr 2.11 (baseline 2.1-2.3)  Hypertension -Pt has had trouble tolerating meds int he past. Higher dose of  hydralazine causes headache and edema. Intolerant to ACE-ARB due to renal function, norvasc due to edema. HCTZ  affected the kidneys.  -Blood pressure is currently elevated. Difficult decision how to manage.   Tildon Husky, NP-C 05/31/2016, 8:19 AM Pager: [phone]  I have seen and examined the patient along with Daune Perch, NP-C.  I have reviewed the chart, notes and new data.  I agree with PA/NP's note.  Key new complaints: headache and dyspnea at rest. Chest pain (abrupt, severe, right sided) has resolved Key examination changes: poor air movement, diminished breath sounds, rare wheezes, normal CV exam Key new findings / data: echo pending, reassuring normal serial troponin and ECG.  PLAN: Avoid contrast based procedures if we can. May have to do a cardiac cath if echo shows regional wall motion abnormalities or a marked reduction in LVEF. Bronchodilators. Multiple intolerances or contraindications to a variety of BP meds due to advanced CKD, bradycardia, headache, etc. For now selective beta clocker and hydralazine seem to be the best option. Can restart amlodipine if necessary and will deal with edema later.  Sanda Klein, MD, [place] [phone] 05/31/2016, 9:29 AM

## 2016-05-31 NOTE — Telephone Encounter (Signed)
Rx was added back to med list at the hospital but last time I can tell it was filled was 10/12/14, please advise

## 2016-05-31 NOTE — Progress Notes (Signed)
  Echocardiogram 2D Echocardiogram has been performed.  Nicole English 05/31/2016, 12:37 PM

## 2016-05-31 NOTE — Progress Notes (Deleted)
Notified by monitor tech patient had " a run of ectopy" on the monitor that lasted ~15-20 seconds. Pt had just finished working with therapy and sat down on bed. Pt was asymptomatic. Dr. Sallyanne Kuster on the floor, strip reviewed with him. States probable accelerated idioventricular rhythm, suggested to continue to replace potassium. Dr. Wendee Beavers paged, additional order of potassium received, will carry out. Daughter at bedside and updated. Will continue to monitor closely.

## 2016-06-01 DIAGNOSIS — R7989 Other specified abnormal findings of blood chemistry: Secondary | ICD-10-CM

## 2016-06-01 DIAGNOSIS — I5033 Acute on chronic diastolic (congestive) heart failure: Secondary | ICD-10-CM

## 2016-06-01 LAB — GLUCOSE, CAPILLARY
Glucose-Capillary: 179 mg/dL — ABNORMAL HIGH (ref 65–99)
Glucose-Capillary: 163 mg/dL — ABNORMAL HIGH (ref 65–99)

## 2016-06-01 LAB — BASIC METABOLIC PANEL
ANION GAP: 13 (ref 5–15)
BUN: 32 mg/dL — ABNORMAL HIGH (ref 6–20)
CO2: 18 mmol/L — AB (ref 22–32)
Calcium: 9.8 mg/dL (ref 8.9–10.3)
Chloride: 102 mmol/L (ref 101–111)
Creatinine, Ser: 2.31 mg/dL — ABNORMAL HIGH (ref 0.44–1.00)
GFR calc Af Amer: 21 mL/min — ABNORMAL LOW (ref >60)
GFR calc non Af Amer: 19 mL/min — ABNORMAL LOW (ref >60)
GLUCOSE: 174 mg/dL — AB (ref 65–99)
Potassium: 4.1 mmol/L (ref 3.5–5.1)
Sodium: 133 mmol/L — ABNORMAL LOW (ref 135–145)

## 2016-06-01 LAB — CBC
HEMATOCRIT: 36 % (ref 36.0–46.0)
Hemoglobin: 11.2 g/dL — ABNORMAL LOW (ref 12.0–15.0)
MCH: 30.6 pg (ref 26.0–34.0)
MCHC: 31.1 g/dL (ref 30.0–36.0)
MCV: 98.4 fL (ref 78.0–100.0)
Platelets: 271 10*3/uL (ref 150–400)
RBC: 3.66 MIL/uL — ABNORMAL LOW (ref 3.87–5.11)
RDW: 12.9 % (ref 11.5–15.5)
WBC: 8 10*3/uL (ref 4.0–10.5)

## 2016-06-01 MED ORDER — DIPHENHYDRAMINE HCL 50 MG/ML IJ SOLN
25.0000 mg | Freq: Once | INTRAMUSCULAR | Status: DC
  Filled 2016-06-01 (×2): qty 1

## 2016-06-01 MED ORDER — IPRATROPIUM-ALBUTEROL 0.5-2.5 (3) MG/3ML IN SOLN
3.0000 mL | Freq: Three times a day (TID) | RESPIRATORY_TRACT | Status: DC
  Administered 2016-06-01 – 2016-06-03 (×7): 3 mL via RESPIRATORY_TRACT
  Filled 2016-06-01 (×6): qty 3

## 2016-06-01 MED ORDER — INSULIN ASPART 100 UNIT/ML ~~LOC~~ SOLN: 0.0000 [IU] | Freq: Every day | SUBCUTANEOUS | Status: DC

## 2016-06-01 MED ORDER — FUROSEMIDE 20 MG PO TABS
30.0000 mg | ORAL_TABLET | Freq: Every day | ORAL | Status: DC
  Administered 2016-06-01 – 2016-06-02 (×2): 30 mg via ORAL
  Filled 2016-06-01 (×2): qty 2

## 2016-06-01 MED ORDER — ENOXAPARIN SODIUM 30 MG/0.3ML ~~LOC~~ SOLN
30.0000 mg | SUBCUTANEOUS | Status: DC
  Administered 2016-06-01 – 2016-06-03 (×3): 30 mg via SUBCUTANEOUS
  Filled 2016-06-01 (×3): qty 0.3

## 2016-06-01 MED ORDER — PROCHLORPERAZINE EDISYLATE 5 MG/ML IJ SOLN
10.0000 mg | Freq: Once | INTRAMUSCULAR | Status: DC
  Filled 2016-06-01 (×3): qty 2

## 2016-06-01 MED ORDER — INSULIN ASPART 100 UNIT/ML ~~LOC~~ SOLN
0.0000 [IU] | Freq: Three times a day (TID) | SUBCUTANEOUS | Status: DC
  Administered 2016-06-02: 2 [IU] via SUBCUTANEOUS
  Administered 2016-06-02 (×2): 1 [IU] via SUBCUTANEOUS

## 2016-06-01 NOTE — Progress Notes (Signed)
83 y.o. female who presented with chest pain on 5/13 that started suddenly with some dyspnea. The patient has a past medical history significant for hypertension, diastolic heart failure, CKD stage IV on lasix, COPD, osteoporosis, chronic drug related bradycardia, CVA, diverticular bleed.  She was last seen by Dr. Martinique on 03/09/2016 at which time she was doing well with well compensated chronic diastolic heart failure. Last Myoview at [facility] 04/2010 with no ischemia and EF of 90%. Echo 03/2012 with EF 60 to 61%, diastolic dysfunction, MAC.   Subjective:  There were no acute events overnight. Pt had some headache. But she denies any chest pain, dizziness, or dyspnea.   Objective:  Temp:  [97.9 F (36.6 C)-98.5 F (36.9 C)] 98.5 F (36.9 C) (05/15 0856) Pulse Rate:  [78-110] 91 (05/15 0836) Resp:  [18] 18 (05/15 0836) BP: (116-144)/(58-79) 144/75 (05/15 0836) SpO2:  [96 %-100 %] 97 % (05/15 0836) Weight change:   Intake/Output from previous day: 05/14 0701 - 05/15 0700 In: 240 [P.O.:240] Out: 300 [Urine:300]  Intake/Output from this shift: Total I/O In: 200 [P.O.:200] Out: -   Medications: Current Facility-Administered Medications  Medication Dose Route Frequency Provider Last Rate Last Dose  . acetaminophen (TYLENOL) tablet 650 mg  650 mg Oral Q4H PRN Samella Parr, NP   650 mg at 06/01/16 0552  . albuterol (PROVENTIL) (2.5 MG/3ML) 0.083% nebulizer solution 2.5 mg  2.5 mg Nebulization Q2H PRN Geradine Girt, DO      . aspirin EC tablet 81 mg  81 mg Oral Daily Samella Parr, NP   81 mg at 06/01/16 0808  . bisoprolol (ZEBETA) tablet 5 mg  5 mg Oral Daily Samella Parr, NP   5 mg at 06/01/16 9509  . calcitRIOL (ROCALTROL) capsule 0.25 mcg  0.25 mcg Oral Once per day on Mon Wed Fri Samella Parr, NP   0.25 mcg at 05/31/16 3267  . cholecalciferol (VITAMIN D) tablet 1,000 Units  1,000 Units Oral Daily Samella Parr, NP   1,000 Units at 06/01/16 1245  . diphenhydrAMINE  (BENADRYL) injection 25 mg  25 mg Intravenous Once Vann, Jessica U, DO      . enoxaparin (LOVENOX) injection 30 mg  30 mg Subcutaneous Q24H [place], Donalynn Furlong, [place]      . gabapentin (NEURONTIN) capsule 100 mg  100 mg Oral BID Samella Parr, NP   100 mg at 06/01/16 8099  . hydrALAZINE (APRESOLINE) injection 10 mg  10 mg Intravenous Q6H PRN Samella Parr, NP   10 mg at 05/30/16 1400  . hydrALAZINE (APRESOLINE) tablet 50 mg  50 mg Oral Q8H Samella Parr, NP   50 mg at 06/01/16 0552  . ipratropium-albuterol (DUONEB) 0.5-2.5 (3) MG/3ML nebulizer solution 3 mL  3 mL Nebulization TID Eulogio Bear U, DO   3 mL at 06/01/16 0836  . methylPREDNISolone sodium succinate (SOLU-MEDROL) 40 mg/mL injection 40 mg  40 mg Intravenous Q6H Vann, Jessica U, DO   40 mg at 06/01/16 0808  . morphine 2 MG/ML injection 1 mg  1 mg Intravenous Q2H PRN Samella Parr, NP   1 mg at 05/30/16 2148  . nitroGLYCERIN (NITROSTAT) SL tablet 0.4 mg  0.4 mg Sublingual Q5 min PRN Elwin Mocha, MD      . ondansetron [facility]) injection 4 mg  4 mg Intravenous Q6H PRN Samella Parr, NP      . oyster calcium tablet 500 mg of elemental calcium  500  mg of elemental calcium Oral Q breakfast Samella Parr, NP   500 mg of elemental calcium at 06/01/16 8616  . prochlorperazine (COMPAZINE) injection 10 mg  10 mg Intravenous Once Vann, Jessica U, DO      . sertraline (ZOLOFT) tablet 50 mg  50 mg Oral QHS Vann, Jessica U, DO   50 mg at 05/31/16 2105  . traMADol (ULTRAM) tablet 50 mg  50 mg Oral BID PRN Samella Parr, NP   50 mg at 06/01/16 8372    Physical Exam: General: Vital signs reviewed. Patient in no acute distress Cardiovascular: regular rate, rhythm, no murmur appreciated  Pulmonary/Chest: Clear to auscultation bilaterally, no wheezes, rales, or rhonchi. Abdominal: Soft, non-tender, non-distended, BS + Extremities: No lower extremity edema bilaterally, pulses symmetric and intact bilaterally. Skin: Warm, dry and  intact. No rashes or erythema.    Lab Results: Results for orders placed or performed during the hospital encounter of 05/30/16 (from the past 48 hour(s))  Basic metabolic panel     Status: Abnormal   Collection Time: 05/30/16 11:00 AM  Result Value Ref Range   Sodium 137 135 - 145 mmol/L   Potassium 5.2 (H) 3.5 - 5.1 mmol/L   Chloride 107 101 - 111 mmol/L   CO2 22 22 - 32 mmol/L   Glucose, Bld 105 (H) 65 - 99 mg/dL   BUN 28 (H) 6 - 20 mg/dL   Creatinine, Ser 2.11 (H) 0.44 - 1.00 mg/dL   Calcium 9.8 8.9 - 10.3 mg/dL   GFR calc non Af Amer 21 (L) >60 mL/min   GFR calc Af Amer 24 (L) >60 mL/min    Comment: (NOTE) The eGFR has been calculated using the CKD EPI equation. This calculation has not been validated in all clinical situations. eGFR's persistently <60 mL/min signify possible Chronic Kidney Disease.    Anion gap 8 5 - 15  CBC     Status: Abnormal   Collection Time: 05/30/16 11:00 AM  Result Value Ref Range   WBC 8.0 4.0 - 10.5 K/uL   RBC 3.85 (L) 3.87 - 5.11 MIL/uL   Hemoglobin 12.2 12.0 - 15.0 g/dL   HCT 38.5 36.0 - 46.0 %   MCV 100.0 78.0 - 100.0 fL   MCH 31.7 26.0 - 34.0 pg   MCHC 31.7 30.0 - 36.0 g/dL   RDW 13.4 11.5 - 15.5 %   Platelets 271 150 - 400 K/uL  I-stat troponin, ED     Status: None   Collection Time: 05/30/16 11:07 AM  Result Value Ref Range   Troponin i, poc 0.00 0.00 - 0.08 ng/mL   Comment 3            Comment: Due to the release kinetics of cTnI, a negative result within the first hours of the onset of symptoms does not rule out myocardial infarction with certainty. If myocardial infarction is still suspected, repeat the test at appropriate intervals.   D-dimer, quantitative (not at [facility])     Status: Abnormal   Collection Time: 05/30/16 12:13 PM  Result Value Ref Range   D-Dimer, Quant 0.67 (H) 0.00 - 0.50 ug/mL-FEU    Comment: (NOTE) At the manufacturer cut-off of 0.50 ug/mL FEU, this assay has been documented to exclude PE with a  sensitivity and negative predictive value of 97 to 99%.  At this time, this assay has not been approved by the FDA to exclude DVT/VTE. Results should be correlated with clinical presentation.   CK  Status: Abnormal   Collection Time: 05/30/16  2:30 PM  Result Value Ref Range   Total CK 30 (L) 38 - 234 U/L  Troponin I (q 6hr x 3)     Status: None   Collection Time: 05/30/16  2:30 PM  Result Value Ref Range   Troponin I <0.03 <0.03 ng/mL  TSH     Status: None   Collection Time: 05/30/16  2:30 PM  Result Value Ref Range   TSH 2.394 0.350 - 4.500 uIU/mL    Comment: Performed by a 3rd Generation assay with a functional sensitivity of <=0.01 uIU/mL.  Troponin I (q 6hr x 3)     Status: None   Collection Time: 05/30/16  7:55 PM  Result Value Ref Range   Troponin I <0.03 <0.03 ng/mL  Troponin I (q 6hr x 3)     Status: None   Collection Time: 05/31/16  2:02 AM  Result Value Ref Range   Troponin I <0.03 <0.03 ng/mL  Basic metabolic panel     Status: Abnormal   Collection Time: 05/31/16 11:32 AM  Result Value Ref Range   Sodium 136 135 - 145 mmol/L   Potassium 4.2 3.5 - 5.1 mmol/L   Chloride 105 101 - 111 mmol/L   CO2 21 (L) 22 - 32 mmol/L   Glucose, Bld 129 (H) 65 - 99 mg/dL   BUN 28 (H) 6 - 20 mg/dL   Creatinine, Ser 2.04 (H) 0.44 - 1.00 mg/dL   Calcium 9.5 8.9 - 10.3 mg/dL   GFR calc non Af Amer 22 (L) >60 mL/min   GFR calc Af Amer 25 (L) >60 mL/min    Comment: (NOTE) The eGFR has been calculated using the CKD EPI equation. This calculation has not been validated in all clinical situations. eGFR's persistently <60 mL/min signify possible Chronic Kidney Disease.    Anion gap 10 5 - 15  CBC     Status: Abnormal   Collection Time: 06/01/16  5:52 AM  Result Value Ref Range   WBC 8.0 4.0 - 10.5 K/uL   RBC 3.66 (L) 3.87 - 5.11 MIL/uL   Hemoglobin 11.2 (L) 12.0 - 15.0 g/dL   HCT 36.0 36.0 - 46.0 %   MCV 98.4 78.0 - 100.0 fL   MCH 30.6 26.0 - 34.0 pg   MCHC 31.1 30.0 - 36.0  g/dL   RDW 12.9 11.5 - 15.5 %   Platelets 271 150 - 400 K/uL  Basic metabolic panel     Status: Abnormal   Collection Time: 06/01/16  5:52 AM  Result Value Ref Range   Sodium 133 (L) 135 - 145 mmol/L   Potassium 4.1 3.5 - 5.1 mmol/L   Chloride 102 101 - 111 mmol/L   CO2 18 (L) 22 - 32 mmol/L   Glucose, Bld 174 (H) 65 - 99 mg/dL   BUN 32 (H) 6 - 20 mg/dL   Creatinine, Ser 2.31 (H) 0.44 - 1.00 mg/dL   Calcium 9.8 8.9 - 10.3 mg/dL   GFR calc non Af Amer 19 (L) >60 mL/min   GFR calc Af Amer 21 (L) >60 mL/min    Comment: (NOTE) The eGFR has been calculated using the CKD EPI equation. This calculation has not been validated in all clinical situations. eGFR's persistently <60 mL/min signify possible Chronic Kidney Disease.    Anion gap 13 5 - 15    Imaging: Imaging results have been reviewed  Assessment:  1. Principal Problem: 2.   Chest pain  3. Active Problems: 4.   Positive D dimer 5.   CKD (chronic kidney disease), stage IV ([place]) 6.   History of CVA (cerebrovascular accident) 7.   Chronic diastolic heart failure, NYHA class 1 ([place]) 8.   Acute hyperkalemia 9.   Uncontrolled hypertension 10.   Sinus bradycardia 11.   Plan:  Chest pain: Troponins were negative for 4 occurrences and EKG without ischemic changes. Her chest pain has resolved. She had a VQ scan that showed low probability for PE. Pt has history of diverticular bleed and may not be an anticoagulation candidate.  Chronic diastolic heart failure: Repeat echo showed EF 60-65%.  No regional wall motion abnormalities. G2DD.  - no need for further workup    Dyspnea- CXR without acute findings -on solumedrol 40 mg q6 hours  -duonebs -albuterol  Hypertension- has been slightly hypertensive -on bisoprolol 5 mg daily -hydralazine 50 mg q8 hours -resume lasix 30 mg daily   CKD stage IV- stable SCr 2.11 (baseline 2.1-2.3)   Length of Stay:  LOS: 1 day    Burgess Estelle 06/01/2016, 10:51 AM  I have seen  and examined the patient along with Burgess Estelle, MD.  I have reviewed the chart, notes and new data.  I agree with his note.  Key new complaints: breathing is better. Steroids and bronchodilators seem to have made the difference. Headache improved.  Key examination changes: BP lower, lungs sound better - more air movement, no wheezes Key new findings / data: VQ low risk, echo shows normal LVEF, unequivocal diastolic dysfunction and elevated filling pressures.  PLAN: Resume home diuretic (she was on 20 mg/30 mg alternating day dosing). Since echo suggests volume overload, but her physical exam is without overt hypervolemia, will start on 30 mg daily. Creatinine has been in the 2.0-2.5 range since 2014 and will require periodic monitoring. Symptoms were not really suggestive of CAD, ECG/biomarkers reassuring and she is not a good candidate for angiography.  No plan for additional cardiac workup at this time.  Sanda Klein, MD, [place] [phone] 06/01/2016, 12:22 PM

## 2016-06-01 NOTE — Evaluation (Signed)
Physical Therapy Evaluation Patient Details Name: Nicole English MRN: [id number] DOB: 1932-07-09 Today's Date: 06/01/2016   History of Present Illness  Pt adm with chest pain. Cardiac work up negative.  PMH - chf, DDD, copd, htn, cva, ckd  Clinical Impression  Pt presents with some "wooziness" and dizziness with mobility and head turns. Suspect may have vestibular issues. Will have another PT perform vestibular eval next visit.    Follow Up Recommendations Other (comment) (To be determined)    Equipment Recommendations       Recommendations for Other Services       Precautions / Restrictions Precautions Precautions: None Restrictions Weight Bearing Restrictions: No      Mobility  Bed Mobility Overal bed mobility: Independent                Transfers Overall transfer level: Modified independent Equipment used: None                Ambulation/Gait Ambulation/Gait assistance: Supervision;Min guard Ambulation Distance (Feet): 375 Feet Assistive device: None Gait Pattern/deviations: Step-through pattern;Drifts right/left Gait velocity: decr Gait velocity interpretation: Below normal speed for age/gender General Gait Details: Amb short distances in room without difficulty. With longer distances in hallway pt more guarded and when asked to due head turns or look up and down pt denies dizziness but is very guarded and hesistant to do them. As distance incr pt reports some "wooziness" and stopped for standing rest break. Returned to room and checked SpO2 which was 100% on RA. HR stable.  Stairs            Wheelchair Mobility    Modified Rankin (Stroke Patients Only)       Balance Overall balance assessment: Needs assistance Sitting-balance support: No upper extremity supported;Feet supported Sitting balance-Leahy Scale: Normal     Standing balance support: No upper extremity supported;During functional activity Standing balance-Leahy Scale:  Good Standing balance comment: Guarded                             Pertinent Vitals/Pain Pain Assessment: No/denies pain    Home Living Family/patient expects to be discharged to:: Private residence Living Arrangements: Alone   Type of Home: House Home Access: Stairs to enter Entrance Stairs-Rails: Right;Left;Can reach both Technical brewer of Steps: 2 Home Layout: One level Home Equipment: None      Prior Function Level of Independence: Independent               Hand Dominance   Dominant Hand: Right    Extremity/Trunk Assessment   Upper Extremity Assessment Upper Extremity Assessment: Overall WFL for tasks assessed    Lower Extremity Assessment Lower Extremity Assessment: Overall WFL for tasks assessed       Communication   Communication: No difficulties  Cognition Arousal/Alertness: Awake/alert Behavior During Therapy: WFL for tasks assessed/performed Overall Cognitive Status: Within Functional Limits for tasks assessed                                        General Comments      Exercises     Assessment/Plan    PT Assessment Patient needs continued PT services  PT Problem List Decreased balance;Decreased mobility       PT Treatment Interventions Gait training;Functional mobility training;Balance training;Patient/family education    PT Goals (Current goals can be found in  the Care Plan section)  Acute Rehab PT Goals Patient Stated Goal: return home PT Goal Formulation: With patient Time For Goal Achievement: 06/08/16 Potential to Achieve Goals: Good    Frequency Min 3X/week   Barriers to discharge        Co-evaluation               AM-PAC PT "6 Clicks" Daily Activity  Outcome Measure Difficulty turning over in bed (including adjusting bedclothes, sheets and blankets)?: None Difficulty moving from lying on back to sitting on the side of the bed? : None Difficulty sitting down on and standing  up from a chair with arms (e.g., wheelchair, bedside commode, etc,.)?: None Help needed moving to and from a bed to chair (including a wheelchair)?: None Help needed walking in hospital room?: None Help needed climbing 3-5 steps with a railing? : A Little 6 Click Score: 23    End of Session   Activity Tolerance: Patient tolerated treatment well Patient left: in bed;with call bell/phone within reach   PT Visit Diagnosis: Unsteadiness on feet (R26.81);Dizziness and giddiness (R42)    Time: 0347-4259 PT Time Calculation (min) (ACUTE ONLY): 17 min   Charges:   PT Evaluation $PT Eval Moderate Complexity: 1 Procedure     PT G CodesMarland Kitchen        [facility] [place] 06/01/2016, 5:54 PM

## 2016-06-01 NOTE — Progress Notes (Signed)
Duplicate/error

## 2016-06-01 NOTE — Progress Notes (Signed)
PROGRESS NOTE    Nicole English  OXB:[id number] DOB: 03-Mar-1932 DOA: 05/30/2016 PCP: Ria Bush, MD   Outpatient Specialists:     Brief Narrative:  Nicole English is a 83 y.o. female with medical history significant for hypertension, chronic diastolic dysfunction. Chronic kidney disease stage IV on Lasix, DDD, osteoporosis, COPD and chronic drug related bradycardia. She has a prior history of CVA and diverticular bleed. Patient reports beginning yesterday she developed onset of pressure-like right-sided chest pain underneath the right breast that has been constant in nature and she reports this makes it difficult to catch her breath although she denies pleuritic type symptoms. No diaphoresis. No GI symptoms. She's not had any lower extremity edema. She is not taking any long trips by automobile or plane. In the ER she was not hypoxemic. She had a bradycardic rate which is chronic on beta blockers. Her d-dimer was mildly elevated at 0.67. She's had some ongoing issues with bilateral hip pain and has been followed by her primary care physician but has not been bedbound because of this. She recently had an insect bite to her right lateral lower leg without any significant redness, edema, or any constitutional symptoms.   Assessment & Plan:   Principal Problem:   Chest pain Active Problems:   Positive D dimer   CKD (chronic kidney disease), stage IV (HCC)   History of CVA (cerebrovascular accident)   Acute on chronic diastolic heart failure (HCC)   Acute hyperkalemia   Uncontrolled hypertension   Sinus bradycardia   Chest pain -cardiology consulted- no further intervention currently planned -echo :Left ventricle: The cavity size was normal. Wall thickness was   normal. Systolic function was normal. The estimated ejection   fraction was in the range of 60% to 65%. Wall motion was normal;   there were no regional wall motion abnormalities. Features are   consistent with a  pseudonormal left ventricular filling pattern,   with concomitant abnormal relaxation and increased filling   pressure (grade 2 diastolic dysfunction). - Aortic valve: Trileaflet; mildly thickened, mildly calcified   leaflets. - Mitral valve: Moderately calcified annulus.  - Compared to the prior study, there has been no significant   interval change.    Positive D dimer -Mildly elevated at 0.67 -V/Q scan negative  COPD -steroids- wean as tolerated -nebs  Headache -IV cocktail    Uncontrolled hypertension -Likely contributing to chest pressure/pain -Continue preadmission beta blocker but given bradycardia cannot increase dosage further -Begin scheduled hydralazine 50 mg every 8 hours with prn IV hydralazine for breakthrough hypertension    CKD (chronic kidney disease), stage IV / Acute hyperkalemia -Renal function is stable and at baseline -hyperkalemia resolved    Chronic diastolic heart failure, NYHA class 1  -Chest x-ray negative -echo as above    Sinus bradycardia -Chronic issue in setting of utilization of beta blocker    History of CVA (cerebrovascular accident) -Continue aspirin -Not on statin      DVT prophylaxis:  Lovenox  Code Status: Full Code   Family Communication:   Disposition Plan:     Consultants:       Subjective: C/o headache  Objective: Vitals:   06/01/16 0630 06/01/16 0836 06/01/16 0856 06/01/16 1224  BP:  (!) 144/75  (!) 124/57  Pulse: 89 91  97  Resp:  18    Temp:   98.5 F (36.9 C) 98.1 F (36.7 C)  TempSrc:   Oral Oral  SpO2: 96% 97%  96%  Weight:  Height:        Intake/Output Summary (Last 24 hours) at 06/01/16 1238 Last data filed at 06/01/16 0856  Gross per 24 hour  Intake              320 ml  Output              300 ml  Net               20 ml   Filed Weights   05/30/16 1046 05/31/16 0450  Weight: 51.3 kg (113 lb) 49.6 kg (109 lb 4.8 oz)    Examination:  General exam: uncomfortable  appearing Respiratory system: moving more air, no wheezing Cardiovascular system: S1 & S2 heard, RRR. No JVD, murmurs, rubs, gallops or clicks. No pedal edema. Gastrointestinal system: Abdomen is nondistended, soft and nontender. No organomegaly or masses felt. Normal bowel sounds heard. Central nervous system: Alert and oriented. No focal neurological deficits.     Data Reviewed: I have personally reviewed following labs and imaging studies  CBC:  Recent Labs Lab 05/30/16 1100 06/01/16 0552  WBC 8.0 8.0  HGB 12.2 11.2*  HCT 38.5 36.0  MCV 100.0 98.4  PLT 271 144   Basic Metabolic Panel:  Recent Labs Lab 05/30/16 1100 05/31/16 1132 06/01/16 0552  NA 137 136 133*  K 5.2* 4.2 4.1  CL 107 105 102  CO2 22 21* 18*  GLUCOSE 105* 129* 174*  BUN 28* 28* 32*  CREATININE 2.11* 2.04* 2.31*  CALCIUM 9.8 9.5 9.8   GFR: Estimated Creatinine Clearance: 14.7 mL/min (A) (by C-G formula based on SCr of 2.31 mg/dL (H)). Liver Function Tests: No results for input(s): AST, ALT, ALKPHOS, BILITOT, PROT, ALBUMIN in the last 168 hours. No results for input(s): LIPASE, AMYLASE in the last 168 hours. No results for input(s): AMMONIA in the last 168 hours. Coagulation Profile: No results for input(s): INR, PROTIME in the last 168 hours. Cardiac Enzymes:  Recent Labs Lab 05/30/16 1430 05/30/16 1955 05/31/16 0202  CKTOTAL 30*  --   --   TROPONINI <0.03 <0.03 <0.03   BNP (last 3 results) No results for input(s): PROBNP in the last 8760 hours. HbA1C: No results for input(s): HGBA1C in the last 72 hours. CBG: No results for input(s): GLUCAP in the last 168 hours. Lipid Profile: No results for input(s): CHOL, HDL, LDLCALC, TRIG, CHOLHDL, LDLDIRECT in the last 72 hours. Thyroid Function Tests:  Recent Labs  05/30/16 1430  TSH 2.394   Anemia Panel: No results for input(s): VITAMINB12, FOLATE, FERRITIN, TIBC, IRON, RETICCTPCT in the last 72 hours. Urine analysis:    Component  Value Date/Time   COLORURINE YELLOW 10/15/2014 [place] 10/15/2014 1349   LABSPEC 1.011 10/15/2014 1349   PHURINE 5.0 10/15/2014 1349   GLUCOSEU NEGATIVE 10/15/2014 1349   HGBUR TRACE (A) 10/15/2014 1349   HGBUR small 09/25/2008 1224   BILIRUBINUR Negative 05/20/2015 1507   KETONESUR NEGATIVE 10/15/2014 1349   PROTEINUR Trace 05/20/2015 1507   PROTEINUR NEGATIVE 10/15/2014 1349   UROBILINOGEN 0.2 05/20/2015 1507   UROBILINOGEN 0.2 10/15/2014 1349   NITRITE Negative 05/20/2015 1507   NITRITE NEGATIVE 10/15/2014 1349   LEUKOCYTESUR Negative 05/20/2015 1507     )No results found for this or any previous visit (from the past 240 hour(s)).    Anti-infectives    None       Radiology Studies: Dg Chest 2 View  Result Date: 05/31/2016 CLINICAL DATA:  Short of breath EXAM:  CHEST  2 VIEW COMPARISON:  05/30/2016 FINDINGS: Bilateral breast implants. Hyperinflation. No focal infiltrate or effusion. Stable cardiomediastinal silhouette with atherosclerosis. No pneumothorax. IMPRESSION: Hyperinflation without acute infiltrate or edema Electronically Signed   By: Donavan Foil M.D.   On: 05/31/2016 21:44   Nm Pulmonary Perf And Vent  Result Date: 05/31/2016 CLINICAL DATA:  Shortness of breath EXAM: NUCLEAR MEDICINE VENTILATION - PERFUSION LUNG SCAN TECHNIQUE: Ventilation images were obtained in multiple projections using inhaled aerosol Tc-2m DTPA. Perfusion images were obtained in multiple projections after intravenous injection of Tc-61m MAA. RADIOPHARMACEUTICALS:  32.8 mCi Technetium-53m DTPA aerosol inhalation and 4.17 mCi Technetium-29m MAA IV COMPARISON:  Radiograph 05/31/2016 FINDINGS: Ventilation: Moderate heterogeneous ventilation with lowers the sensitivity of the study. Perfusion: Small bilateral nonsegmental defects with corresponding abnormal ventilation consistent with matched defects. IMPRESSION: 1. Heterogenous ventilation which lowers the sensitivity of the study.  2. Overall low probability for pulmonary embolus 3. Multiple bilateral matched defects with generalized heterogenous ventilation; the pattern would be consistent with obstructive airways disease. Electronically Signed   By: Donavan Foil M.D.   On: 05/31/2016 21:44        Scheduled Meds: . aspirin EC  81 mg Oral Daily  . bisoprolol  5 mg Oral Daily  . calcitRIOL  0.25 mcg Oral Once per day on Mon Wed Fri  . cholecalciferol  1,000 Units Oral Daily  . diphenhydrAMINE  25 mg Intravenous Once  . enoxaparin (LOVENOX) injection  30 mg Subcutaneous Q24H  . furosemide  30 mg Oral Daily  . gabapentin  100 mg Oral BID  . hydrALAZINE  50 mg Oral Q8H  . ipratropium-albuterol  3 mL Nebulization TID  . methylPREDNISolone (SOLU-MEDROL) injection  40 mg Intravenous Q6H  . oyster calcium  500 mg of elemental calcium Oral Q breakfast  . prochlorperazine  10 mg Intravenous Once  . sertraline  50 mg Oral QHS   Continuous Infusions:   LOS: 1 day    Time spent:  25 min    [place], DO [facility] Pager [phone]  If 7PM-7AM, please contact night-coverage [email or web address] Password [facility] 06/01/2016, 12:38 PM

## 2016-06-02 DIAGNOSIS — E871 Hypo-osmolality and hyponatremia: Secondary | ICD-10-CM

## 2016-06-02 DIAGNOSIS — R0602 Shortness of breath: Secondary | ICD-10-CM

## 2016-06-02 LAB — BASIC METABOLIC PANEL
ANION GAP: 11 (ref 5–15)
ANION GAP: 10 (ref 5–15)
Anion gap: 11 (ref 5–15)
BUN: 40 mg/dL — ABNORMAL HIGH (ref 6–20)
BUN: 42 mg/dL — ABNORMAL HIGH (ref 6–20)
BUN: 42 mg/dL — AB (ref 6–20)
CALCIUM: 9.8 mg/dL (ref 8.9–10.3)
CALCIUM: 9.5 mg/dL (ref 8.9–10.3)
CALCIUM: 9.5 mg/dL (ref 8.9–10.3)
CHLORIDE: 92 mmol/L — AB (ref 101–111)
CO2: 19 mmol/L — AB (ref 22–32)
CO2: 20 mmol/L — AB (ref 22–32)
CO2: 20 mmol/L — ABNORMAL LOW (ref 22–32)
CREATININE: 2.51 mg/dL — AB (ref 0.44–1.00)
CREATININE: 2.43 mg/dL — AB (ref 0.44–1.00)
Chloride: 95 mmol/L — ABNORMAL LOW (ref 101–111)
Chloride: 92 mmol/L — ABNORMAL LOW (ref 101–111)
Creatinine, Ser: 2.4 mg/dL — ABNORMAL HIGH (ref 0.44–1.00)
GFR calc Af Amer: 21 mL/min — ABNORMAL LOW (ref >60)
GFR calc non Af Amer: 17 mL/min — ABNORMAL LOW (ref >60)
GFR, EST AFRICAN AMERICAN: 19 mL/min — AB (ref >60)
GFR, EST AFRICAN AMERICAN: 20 mL/min — AB (ref >60)
GFR, EST NON AFRICAN AMERICAN: 18 mL/min — AB (ref >60)
GFR, EST NON AFRICAN AMERICAN: 17 mL/min — AB (ref >60)
GLUCOSE: 149 mg/dL — AB (ref 65–99)
Glucose, Bld: 131 mg/dL — ABNORMAL HIGH (ref 65–99)
Glucose, Bld: 155 mg/dL — ABNORMAL HIGH (ref 65–99)
POTASSIUM: 4.7 mmol/L (ref 3.5–5.1)
POTASSIUM: 5.1 mmol/L (ref 3.5–5.1)
Potassium: 4.9 mmol/L (ref 3.5–5.1)
SODIUM: 122 mmol/L — AB (ref 135–145)
Sodium: 125 mmol/L — ABNORMAL LOW (ref 135–145)
Sodium: 123 mmol/L — ABNORMAL LOW (ref 135–145)

## 2016-06-02 LAB — CBC
HEMATOCRIT: 33.6 % — AB (ref 36.0–46.0)
Hemoglobin: 11 g/dL — ABNORMAL LOW (ref 12.0–15.0)
MCH: 31.3 pg (ref 26.0–34.0)
MCHC: 32.7 g/dL (ref 30.0–36.0)
MCV: 95.5 fL (ref 78.0–100.0)
Platelets: 271 10*3/uL (ref 150–400)
RBC: 3.52 MIL/uL — AB (ref 3.87–5.11)
RDW: 13.2 % (ref 11.5–15.5)
WBC: 23 10*3/uL — AB (ref 4.0–10.5)

## 2016-06-02 LAB — GLUCOSE, CAPILLARY
GLUCOSE-CAPILLARY: 149 mg/dL — AB (ref 65–99)
GLUCOSE-CAPILLARY: 152 mg/dL — AB (ref 65–99)
GLUCOSE-CAPILLARY: 157 mg/dL — AB (ref 65–99)
Glucose-Capillary: 135 mg/dL — ABNORMAL HIGH (ref 65–99)

## 2016-06-02 LAB — OSMOLALITY: Osmolality: 279 mOsm/kg (ref 275–295)

## 2016-06-02 MED ORDER — THIAMINE HCL 100 MG/ML IJ SOLN: 100.0000 mg | Freq: Every day | INTRAMUSCULAR | Status: DC

## 2016-06-02 MED ORDER — SODIUM CHLORIDE 0.9 % IV SOLN
INTRAVENOUS | Status: DC
  Administered 2016-06-02 – 2016-06-03 (×3): via INTRAVENOUS

## 2016-06-02 MED ORDER — VITAMIN B-1 100 MG PO TABS
100.0000 mg | ORAL_TABLET | Freq: Every day | ORAL | Status: DC
  Administered 2016-06-02 – 2016-06-04 (×3): 100 mg via ORAL
  Filled 2016-06-02 (×3): qty 1

## 2016-06-02 MED ORDER — FOLIC ACID 1 MG PO TABS
1.0000 mg | ORAL_TABLET | Freq: Every day | ORAL | Status: DC
  Administered 2016-06-02 – 2016-06-04 (×3): 1 mg via ORAL
  Filled 2016-06-02 (×3): qty 1

## 2016-06-02 MED ORDER — PREDNISONE 20 MG PO TABS: 40.0000 mg | ORAL_TABLET | Freq: Every day | ORAL | Status: DC

## 2016-06-02 MED ORDER — PREDNISONE 20 MG PO TABS
30.0000 mg | ORAL_TABLET | Freq: Every day | ORAL | Status: DC
  Administered 2016-06-03 – 2016-06-04 (×2): 30 mg via ORAL
  Filled 2016-06-02 (×2): qty 1

## 2016-06-02 MED ORDER — ADULT MULTIVITAMIN W/MINERALS CH
1.0000 | ORAL_TABLET | Freq: Every day | ORAL | Status: DC
  Administered 2016-06-02 – 2016-06-04 (×3): 1 via ORAL
  Filled 2016-06-02 (×3): qty 1

## 2016-06-02 MED ORDER — LORAZEPAM 1 MG PO TABS: 1.0000 mg | ORAL_TABLET | Freq: Four times a day (QID) | ORAL | Status: DC | PRN

## 2016-06-02 MED ORDER — LORAZEPAM 2 MG/ML IJ SOLN: 1.0000 mg | Freq: Four times a day (QID) | INTRAMUSCULAR | Status: DC | PRN

## 2016-06-02 NOTE — Progress Notes (Addendum)
PROGRESS NOTE    Nicole English  DJM:[id number] DOB: 01-Jan-1933 DOA: 05/30/2016 PCP: Ria Bush, MD   Outpatient Specialists:     Brief Narrative:  Nicole English is a 83 y.o. female with medical history significant for hypertension, chronic diastolic dysfunction. Chronic kidney disease stage IV on Lasix, DDD, osteoporosis, COPD and chronic drug related bradycardia. She has a prior history of CVA and diverticular bleed. Patient reports beginning yesterday she developed onset of pressure-like right-sided chest pain underneath the right breast that has been constant in nature and she reports this makes it difficult to catch her breath although she denies pleuritic type symptoms. No diaphoresis. No GI symptoms. She's not had any lower extremity edema. She is not taking any long trips by automobile or plane. In the ER she was not hypoxemic. She had a bradycardic rate which is chronic on beta blockers. Her d-dimer was mildly elevated at 0.67. She's had some ongoing issues with bilateral hip pain and has been followed by her primary care physician but has not been bedbound because of this. She recently had an insect bite to her right lateral lower leg without any significant redness, edema, or any constitutional symptoms.   Assessment & Plan:   Principal Problem:   Chest pain Active Problems:   Positive D dimer   CKD (chronic kidney disease), stage IV (HCC)   History of CVA (cerebrovascular accident)   Acute on chronic diastolic heart failure (HCC)   Acute hyperkalemia   Uncontrolled hypertension   Sinus bradycardia   Chest pain -cardiology consulted- no further intervention currently planned -echo :Left ventricle: The cavity size was normal. Wall thickness was   normal. Systolic function was normal. The estimated ejection   fraction was in the range of 60% to 65%. Wall motion was normal;   there were no regional wall motion abnormalities. Features are   consistent with a  pseudonormal left ventricular filling pattern,   with concomitant abnormal relaxation and increased filling   pressure (grade 2 diastolic dysfunction). - Aortic valve: Trileaflet; mildly thickened, mildly calcified   leaflets. - Mitral valve: Moderately calcified annulus.  - Compared to the prior study, there has been no significant   interval change.  Hyponatremia -? Etiology -gentle IVF -fluid restrict -Urine osmo/serum osmo, urine Na ? zoloft    Positive D dimer -Mildly elevated at 0.67 -V/Q scan negative  COPD -steroids- wean as tolerated -nebs  Headache -IV cocktail improved but not resolved    Uncontrolled hypertension -Likely contributing to chest pressure/pain -Continue preadmission beta blocker but given bradycardia cannot increase dosage further -Begin scheduled hydralazine 50 mg every 8 hours with prn IV hydralazine for breakthrough hypertension    CKD (chronic kidney disease), stage IV / Acute hyperkalemia -Renal function is stable and at baseline -hyperkalemia resolved    Chronic diastolic heart failure, NYHA class 1  -Chest x-ray negative -echo as above    Sinus bradycardia -Chronic issue in setting of utilization of beta blocker    History of CVA (cerebrovascular accident) -Continue aspirin -Not on statin   ? Alcohol use -CIWA protocol    DVT prophylaxis:  Lovenox  Code Status: Full Code   Family Communication:   Disposition Plan:     Consultants:       Subjective: C/o headache  Objective: Vitals:   06/01/16 2025 06/02/16 0633 06/02/16 1027 06/02/16 1446  BP:  (!) 131/56  131/65  Pulse:  79 73 76  Resp:   18 18  Temp:  97.8 F (36.6 C)  98.2 F (36.8 C)  TempSrc:  Oral  Oral  SpO2: 98% 97% 96% 96%  Weight:  51.2 kg (112 lb 12.8 oz)    Height:        Intake/Output Summary (Last 24 hours) at 06/02/16 1607 Last data filed at 06/02/16 1414  Gross per 24 hour  Intake              600 ml  Output                 0 ml  Net              600 ml   Filed Weights   05/30/16 1046 05/31/16 0450 06/02/16 0633  Weight: 51.3 kg (113 lb) 49.6 kg (109 lb 4.8 oz) 51.2 kg (112 lb 12.8 oz)    Examination:  General exam: tremulous Respiratory system: min wheezing, more airmovement Cardiovascular system: S1 & S2 heard, RRR. No JVD, murmurs, rubs, gallops or clicks. No pedal edema. Gastrointestinal system: Abdomen is nondistended, soft and nontender. No organomegaly or masses felt. Normal bowel sounds heard. Central nervous system: tremors     Data Reviewed: I have personally reviewed following labs and imaging studies  CBC:  Recent Labs Lab 05/30/16 1100 06/01/16 0552 06/02/16 0515  WBC 8.0 8.0 23.0*  HGB 12.2 11.2* 11.0*  HCT 38.5 36.0 33.6*  MCV 100.0 98.4 95.5  PLT 271 271 053   Basic Metabolic Panel:  Recent Labs Lab 05/30/16 1100 05/31/16 1132 06/01/16 0552 06/02/16 0515 06/02/16 1513  NA 137 136 133* 125* 122*  K 5.2* 4.2 4.1 4.7 4.9  CL 107 105 102 95* 92*  CO2 22 21* 18* 19* 20*  GLUCOSE 105* 129* 174* 149* 131*  BUN 28* 28* 32* 40* 42*  CREATININE 2.11* 2.04* 2.31* 2.40* 2.51*  CALCIUM 9.8 9.5 9.8 9.8 9.5   GFR: Estimated Creatinine Clearance: 13.7 mL/min (A) (by C-G formula based on SCr of 2.51 mg/dL (H)). Liver Function Tests: No results for input(s): AST, ALT, ALKPHOS, BILITOT, PROT, ALBUMIN in the last 168 hours. No results for input(s): LIPASE, AMYLASE in the last 168 hours. No results for input(s): AMMONIA in the last 168 hours. Coagulation Profile: No results for input(s): INR, PROTIME in the last 168 hours. Cardiac Enzymes:  Recent Labs Lab 05/30/16 1430 05/30/16 1955 05/31/16 0202  CKTOTAL 30*  --   --   TROPONINI <0.03 <0.03 <0.03   BNP (last 3 results) No results for input(s): PROBNP in the last 8760 hours. HbA1C: No results for input(s): HGBA1C in the last 72 hours. CBG:  Recent Labs Lab 06/01/16 1609 06/01/16 2107 06/02/16 0801  06/02/16 1148 06/02/16 1606  GLUCAP 179* 163* 135* 149* 152*   Lipid Profile: No results for input(s): CHOL, HDL, LDLCALC, TRIG, CHOLHDL, LDLDIRECT in the last 72 hours. Thyroid Function Tests: No results for input(s): TSH, T4TOTAL, FREET4, T3FREE, THYROIDAB in the last 72 hours. Anemia Panel: No results for input(s): VITAMINB12, FOLATE, FERRITIN, TIBC, IRON, RETICCTPCT in the last 72 hours. Urine analysis:    Component Value Date/Time   COLORURINE YELLOW 10/15/2014 [place] 10/15/2014 1349   LABSPEC 1.011 10/15/2014 1349   PHURINE 5.0 10/15/2014 1349   GLUCOSEU NEGATIVE 10/15/2014 1349   HGBUR TRACE (A) 10/15/2014 1349   HGBUR small 09/25/2008 1224   BILIRUBINUR Negative 05/20/2015 1507   KETONESUR NEGATIVE 10/15/2014 1349   PROTEINUR Trace 05/20/2015 1507   PROTEINUR NEGATIVE 10/15/2014 1349  UROBILINOGEN 0.2 05/20/2015 1507   UROBILINOGEN 0.2 10/15/2014 1349   NITRITE Negative 05/20/2015 1507   NITRITE NEGATIVE 10/15/2014 1349   LEUKOCYTESUR Negative 05/20/2015 1507     )No results found for this or any previous visit (from the past 240 hour(s)).    Anti-infectives    None       Radiology Studies: Dg Chest 2 View  Result Date: 05/31/2016 CLINICAL DATA:  Short of breath EXAM: CHEST  2 VIEW COMPARISON:  05/30/2016 FINDINGS: Bilateral breast implants. Hyperinflation. No focal infiltrate or effusion. Stable cardiomediastinal silhouette with atherosclerosis. No pneumothorax. IMPRESSION: Hyperinflation without acute infiltrate or edema Electronically Signed   By: Donavan Foil M.D.   On: 05/31/2016 21:44   Nm Pulmonary Perf And Vent  Result Date: 05/31/2016 CLINICAL DATA:  Shortness of breath EXAM: NUCLEAR MEDICINE VENTILATION - PERFUSION LUNG SCAN TECHNIQUE: Ventilation images were obtained in multiple projections using inhaled aerosol Tc-81m DTPA. Perfusion images were obtained in multiple projections after intravenous injection of Tc-52m MAA.  RADIOPHARMACEUTICALS:  32.8 mCi Technetium-36m DTPA aerosol inhalation and 4.17 mCi Technetium-10m MAA IV COMPARISON:  Radiograph 05/31/2016 FINDINGS: Ventilation: Moderate heterogeneous ventilation with lowers the sensitivity of the study. Perfusion: Small bilateral nonsegmental defects with corresponding abnormal ventilation consistent with matched defects. IMPRESSION: 1. Heterogenous ventilation which lowers the sensitivity of the study. 2. Overall low probability for pulmonary embolus 3. Multiple bilateral matched defects with generalized heterogenous ventilation; the pattern would be consistent with obstructive airways disease. Electronically Signed   By: Donavan Foil M.D.   On: 05/31/2016 21:44        Scheduled Meds: . aspirin EC  81 mg Oral Daily  . bisoprolol  5 mg Oral Daily  . calcitRIOL  0.25 mcg Oral Once per day on Mon Wed Fri  . cholecalciferol  1,000 Units Oral Daily  . diphenhydrAMINE  25 mg Intravenous Once  . enoxaparin (LOVENOX) injection  30 mg Subcutaneous Q24H  . folic acid  1 mg Oral Daily  . gabapentin  100 mg Oral BID  . hydrALAZINE  50 mg Oral Q8H  . insulin aspart  0-5 Units Subcutaneous QHS  . insulin aspart  0-9 Units Subcutaneous TID WC  . ipratropium-albuterol  3 mL Nebulization TID  . multivitamin with minerals  1 tablet Oral Daily  . oyster calcium  500 mg of elemental calcium Oral Q breakfast  . [START ON 06/03/2016] predniSONE  30 mg Oral Q breakfast  . prochlorperazine  10 mg Intravenous Once  . sertraline  50 mg Oral QHS  . thiamine  100 mg Oral Daily   Or  . thiamine  100 mg Intravenous Daily   Continuous Infusions: . sodium chloride 75 mL/hr at 06/02/16 1307     LOS: 2 days    Time spent:  25 min    [place], DO [facility] Pager [phone]  If 7PM-7AM, please contact night-coverage [email or web address] Password [email or web address] 06/02/2016, 4:07 PM

## 2016-06-02 NOTE — Progress Notes (Signed)
83 y.o. female who presented with chest pain on 5/13 that started suddenly with some dyspnea. The patient has a past medical history significant for hypertension, diastolic heart failure, CKD stage IV on lasix, COPD, osteoporosis, chronic drug related bradycardia, CVA, diverticular bleed.  She was last seen by Dr. Martinique on 03/09/2016 at which time she was doing well with well compensated chronic diastolic heart failure. Last Myoview at [facility] 04/2010 with no ischemia and EF of 90%. Echo 03/2012 with EF 60 to 69%, diastolic dysfunction, MAC.   Subjective:  There were no acute events overnight. Pt had some headache which is bilateral and throbbing. She just took the tramadol. At home, tramadol usually helps the headache followed by tylenol.   Objective:  Temp:  [97.8 F (36.6 C)-98.2 F (36.8 C)] 97.8 F (36.6 C) (05/16 6295) Pulse Rate:  [79-98] 79 (05/16 0633) Resp:  [18] 18 (05/15 1421) BP: (124-132)/(56-72) 131/56 (05/16 0633) SpO2:  [96 %-99 %] 97 % (05/16 2841) Weight:  [112 lb 12.8 oz (51.2 kg)] 112 lb 12.8 oz (51.2 kg) (05/16 3244) Weight change:   Intake/Output from previous day: 05/15 0701 - 05/16 0700 In: 200 [P.O.:200] Out: -   Intake/Output from this shift: Total I/O In: 360 [P.O.:360] Out: -   Medications: Current Facility-Administered Medications  Medication Dose Route Frequency Provider Last Rate Last Dose  . acetaminophen (TYLENOL) tablet 650 mg  650 mg Oral Q4H PRN Samella Parr, NP   650 mg at 06/01/16 1348  . albuterol (PROVENTIL) (2.5 MG/3ML) 0.083% nebulizer solution 2.5 mg  2.5 mg Nebulization Q2H PRN Geradine Girt, DO      . aspirin EC tablet 81 mg  81 mg Oral Daily Samella Parr, NP   81 mg at 06/01/16 0808  . bisoprolol (ZEBETA) tablet 5 mg  5 mg Oral Daily Samella Parr, NP   5 mg at 06/01/16 0102  . calcitRIOL (ROCALTROL) capsule 0.25 mcg  0.25 mcg Oral Once per day on Mon Wed Fri Samella Parr, NP   0.25 mcg at 05/31/16 7253  .  cholecalciferol (VITAMIN D) tablet 1,000 Units  1,000 Units Oral Daily Samella Parr, NP   1,000 Units at 06/01/16 6644  . diphenhydrAMINE (BENADRYL) injection 25 mg  25 mg Intravenous Once Vann, Jessica U, DO      . enoxaparin (LOVENOX) injection 30 mg  30 mg Subcutaneous Q24H Alvira Philips, [place]   30 mg at 06/01/16 2055  . furosemide (LASIX) tablet 30 mg  30 mg Oral Daily Theodis Kinsel, MD   30 mg at 06/01/16 1348  . gabapentin (NEURONTIN) capsule 100 mg  100 mg Oral BID Samella Parr, NP   100 mg at 06/01/16 2054  . hydrALAZINE (APRESOLINE) injection 10 mg  10 mg Intravenous Q6H PRN Samella Parr, NP   10 mg at 05/30/16 1400  . hydrALAZINE (APRESOLINE) tablet 50 mg  50 mg Oral Q8H Samella Parr, NP   50 mg at 06/02/16 0347  . insulin aspart (novoLOG) injection 0-5 Units  0-5 Units Subcutaneous QHS Vann, Jessica U, DO      . insulin aspart (novoLOG) injection 0-9 Units  0-9 Units Subcutaneous TID WC Vann, Jessica U, DO   1 Units at 06/02/16 0835  . ipratropium-albuterol (DUONEB) 0.5-2.5 (3) MG/3ML nebulizer solution 3 mL  3 mL Nebulization TID Eulogio Bear U, DO   3 mL at 06/01/16 2058  . methylPREDNISolone sodium succinate (SOLU-MEDROL) 40 mg/mL injection 40  mg  40 mg Intravenous Q6H Vann, Jessica U, DO   40 mg at 06/02/16 0837  . morphine 2 MG/ML injection 1 mg  1 mg Intravenous Q2H PRN Samella Parr, NP   1 mg at 05/30/16 2148  . nitroGLYCERIN (NITROSTAT) SL tablet 0.4 mg  0.4 mg Sublingual Q5 min PRN Elwin Mocha, MD      . ondansetron [facility]) injection 4 mg  4 mg Intravenous Q6H PRN Samella Parr, NP      . oyster calcium tablet 500 mg of elemental calcium  500 mg of elemental calcium Oral Q breakfast Samella Parr, NP   500 mg of elemental calcium at 06/02/16 0837  . prochlorperazine (COMPAZINE) injection 10 mg  10 mg Intravenous Once Vann, Jessica U, DO      . sertraline (ZOLOFT) tablet 50 mg  50 mg Oral QHS Vann, Jessica U, DO   50 mg at 06/01/16 2054  .  traMADol (ULTRAM) tablet 50 mg  50 mg Oral BID PRN Samella Parr, NP   50 mg at 06/02/16 1601    Physical Exam: General: Vital signs reviewed. Patient in no acute distress Cardiovascular: regular rate, rhythm, no murmur appreciated  Pulmonary/Chest: Clear to auscultation bilaterally, no wheezes, rales, or rhonchi. Abdominal: Soft, non-tender, non-distended, BS + Extremities: No lower extremity edema bilaterally, pulses symmetric and intact bilaterally. Skin: Warm, dry and intact. No rashes or erythema.    Lab Results: Results for orders placed or performed during the hospital encounter of 05/30/16 (from the past 48 hour(s))  Basic metabolic panel     Status: Abnormal   Collection Time: 05/31/16 11:32 AM  Result Value Ref Range   Sodium 136 135 - 145 mmol/L   Potassium 4.2 3.5 - 5.1 mmol/L   Chloride 105 101 - 111 mmol/L   CO2 21 (L) 22 - 32 mmol/L   Glucose, Bld 129 (H) 65 - 99 mg/dL   BUN 28 (H) 6 - 20 mg/dL   Creatinine, Ser 2.04 (H) 0.44 - 1.00 mg/dL   Calcium 9.5 8.9 - 10.3 mg/dL   GFR calc non Af Amer 22 (L) >60 mL/min   GFR calc Af Amer 25 (L) >60 mL/min    Comment: (NOTE) The eGFR has been calculated using the CKD EPI equation. This calculation has not been validated in all clinical situations. eGFR's persistently <60 mL/min signify possible Chronic Kidney Disease.    Anion gap 10 5 - 15  CBC     Status: Abnormal   Collection Time: 06/01/16  5:52 AM  Result Value Ref Range   WBC 8.0 4.0 - 10.5 K/uL   RBC 3.66 (L) 3.87 - 5.11 MIL/uL   Hemoglobin 11.2 (L) 12.0 - 15.0 g/dL   HCT 36.0 36.0 - 46.0 %   MCV 98.4 78.0 - 100.0 fL   MCH 30.6 26.0 - 34.0 pg   MCHC 31.1 30.0 - 36.0 g/dL   RDW 12.9 11.5 - 15.5 %   Platelets 271 150 - 400 K/uL  Basic metabolic panel     Status: Abnormal   Collection Time: 06/01/16  5:52 AM  Result Value Ref Range   Sodium 133 (L) 135 - 145 mmol/L   Potassium 4.1 3.5 - 5.1 mmol/L   Chloride 102 101 - 111 mmol/L   CO2 18 (L) 22 - 32  mmol/L   Glucose, Bld 174 (H) 65 - 99 mg/dL   BUN 32 (H) 6 - 20 mg/dL   Creatinine, Ser 2.31 (  H) 0.44 - 1.00 mg/dL   Calcium 9.8 8.9 - 10.3 mg/dL   GFR calc non Af Amer 19 (L) >60 mL/min   GFR calc Af Amer 21 (L) >60 mL/min    Comment: (NOTE) The eGFR has been calculated using the CKD EPI equation. This calculation has not been validated in all clinical situations. eGFR's persistently <60 mL/min signify possible Chronic Kidney Disease.    Anion gap 13 5 - 15  Glucose, capillary     Status: Abnormal   Collection Time: 06/01/16  4:09 PM  Result Value Ref Range   Glucose-Capillary 179 (H) 65 - 99 mg/dL  Glucose, capillary     Status: Abnormal   Collection Time: 06/01/16  9:07 PM  Result Value Ref Range   Glucose-Capillary 163 (H) 65 - 99 mg/dL  CBC     Status: Abnormal   Collection Time: 06/02/16  5:15 AM  Result Value Ref Range   WBC 23.0 (H) 4.0 - 10.5 K/uL   RBC 3.52 (L) 3.87 - 5.11 MIL/uL   Hemoglobin 11.0 (L) 12.0 - 15.0 g/dL   HCT 33.6 (L) 36.0 - 46.0 %   MCV 95.5 78.0 - 100.0 fL   MCH 31.3 26.0 - 34.0 pg   MCHC 32.7 30.0 - 36.0 g/dL   RDW 13.2 11.5 - 15.5 %   Platelets 271 150 - 400 K/uL  Basic metabolic panel     Status: Abnormal   Collection Time: 06/02/16  5:15 AM  Result Value Ref Range   Sodium 125 (L) 135 - 145 mmol/L    Comment: DELTA CHECK NOTED   Potassium 4.7 3.5 - 5.1 mmol/L   Chloride 95 (L) 101 - 111 mmol/L   CO2 19 (L) 22 - 32 mmol/L   Glucose, Bld 149 (H) 65 - 99 mg/dL   BUN 40 (H) 6 - 20 mg/dL   Creatinine, Ser 2.40 (H) 0.44 - 1.00 mg/dL   Calcium 9.8 8.9 - 10.3 mg/dL   GFR calc non Af Amer 18 (L) >60 mL/min   GFR calc Af Amer 21 (L) >60 mL/min    Comment: (NOTE) The eGFR has been calculated using the CKD EPI equation. This calculation has not been validated in all clinical situations. eGFR's persistently <60 mL/min signify possible Chronic Kidney Disease.    Anion gap 11 5 - 15  Glucose, capillary     Status: Abnormal   Collection Time:  06/02/16  8:01 AM  Result Value Ref Range   Glucose-Capillary 135 (H) 65 - 99 mg/dL    Imaging: Imaging results have been reviewed  Assessment:  Principal Problem:   Chest pain Active Problems:   Positive D dimer   CKD (chronic kidney disease), stage IV (HCC)   History of CVA (cerebrovascular accident)   Acute on chronic diastolic heart failure (HCC)   Acute hyperkalemia   Uncontrolled hypertension   Sinus bradycardia   Plan:  Chest pain: Troponins were negative for 4 occurrences and EKG without ischemic changes. Her chest pain has resolved. She had a VQ scan that showed low probability for PE. Pt has history of diverticular bleed and may not be an anticoagulation candidate.  Chronic diastolic heart failure: Repeat echo showed EF 60-65%.  No regional wall motion abnormalities. G2DD.  - no need for further workup   Dyspnea- CXR without acute findings. Solumedrol stopped and she is now on prednisone -on prednisone currently    Hypertension- has been normotensive  -on bisoprolol 5 mg daily -hydralazine 50 mg  q8 hours -lasix 30 mg daily- received today -STOPPED lasix for tomorrow   Hyponatremia: Na of 125 , which decreased from 133- could be related to restarting lasix -management per primary team , will likely need further work up[employer], UCr, Serum osm, urine osm, strict I&o, restrict free water. She is euvolemic on exam -repeat BMET tomorrow    CKD stage IV- stable SCr 2.11 (baseline 2.1-2.3) -Cr increased slightly to 2.4 . Stopped lasix from tomorrow. Resume on discharge     Length of Stay:  LOS: 2 days    Burgess Estelle 06/02/2016, 9:20 AM  I have seen and examined the patient along with Burgess Estelle, MD.  I have reviewed the chart, notes and new data.  I agree with his note.  Key new complaints: dyspnea resolved, headache persists Key examination changes: normal BP Key new findings / data: mild increase in creatinine and marked worsening of  hyponatremia  PLAN: Hold diuretic today and recheck BMET. Discharge on 30 mg PO alternating with 20 mg PO daily.  Sanda Klein, MD, [place] [phone] 06/02/2016, 11:56 AM
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
## 2016-06-02 NOTE — Progress Notes (Signed)
Tech offered Pt a bath. Pt stated that she has already did her bath.

## 2016-06-03 ENCOUNTER — Inpatient Hospital Stay (HOSPITAL_COMMUNITY): Payer: Medicare Other

## 2016-06-03 LAB — BASIC METABOLIC PANEL
ANION GAP: 10 (ref 5–15)
Anion gap: 11 (ref 5–15)
BUN: 42 mg/dL — AB (ref 6–20)
BUN: 45 mg/dL — ABNORMAL HIGH (ref 6–20)
CALCIUM: 9.4 mg/dL (ref 8.9–10.3)
CHLORIDE: 92 mmol/L — AB (ref 101–111)
CHLORIDE: 95 mmol/L — AB (ref 101–111)
CO2: 20 mmol/L — AB (ref 22–32)
CO2: 17 mmol/L — AB (ref 22–32)
Calcium: 9.1 mg/dL (ref 8.9–10.3)
Creatinine, Ser: 2.44 mg/dL — ABNORMAL HIGH (ref 0.44–1.00)
Creatinine, Ser: 2.41 mg/dL — ABNORMAL HIGH (ref 0.44–1.00)
GFR calc Af Amer: 20 mL/min — ABNORMAL LOW (ref >60)
GFR, EST AFRICAN AMERICAN: 20 mL/min — AB (ref >60)
GFR, EST NON AFRICAN AMERICAN: 17 mL/min — AB (ref >60)
GFR, EST NON AFRICAN AMERICAN: 18 mL/min — AB (ref >60)
GLUCOSE: 110 mg/dL — AB (ref 65–99)
Glucose, Bld: 128 mg/dL — ABNORMAL HIGH (ref 65–99)
POTASSIUM: 4.7 mmol/L (ref 3.5–5.1)
Potassium: 5.9 mmol/L — ABNORMAL HIGH (ref 3.5–5.1)
Sodium: 123 mmol/L — ABNORMAL LOW (ref 135–145)
Sodium: 122 mmol/L — ABNORMAL LOW (ref 135–145)

## 2016-06-03 LAB — SODIUM, URINE, RANDOM: SODIUM UR: 12 mmol/L

## 2016-06-03 LAB — GLUCOSE, CAPILLARY
GLUCOSE-CAPILLARY: 130 mg/dL — AB (ref 65–99)
Glucose-Capillary: 96 mg/dL (ref 65–99)
Glucose-Capillary: 89 mg/dL (ref 65–99)
Glucose-Capillary: 158 mg/dL — ABNORMAL HIGH (ref 65–99)

## 2016-06-03 LAB — OSMOLALITY, URINE: Osmolality, Ur: 147 mOsm/kg — ABNORMAL LOW (ref 300–900)

## 2016-06-03 NOTE — Progress Notes (Signed)
PROGRESS NOTE    [name]  VEL:[id number] DOB: [birth year] DOA: 05/30/2016 PCP: Ria Bush, MD   Outpatient Specialists:     Brief Narrative:  Nicole English is a 83 y.o. female with medical history significant for hypertension, chronic diastolic dysfunction. Chronic kidney disease stage IV on Lasix, DDD, osteoporosis, COPD and chronic drug related bradycardia. She has a prior history of CVA and diverticular bleed. Patient reports beginning yesterday she developed onset of pressure-like right-sided chest pain underneath the right breast that has been constant in nature and she reports this makes it difficult to catch her breath although she denies pleuritic type symptoms. Found to have elevated BP, unchanged echo, negative V/Q but stay complicated by hyponatremia.  Work up in progress.  Assessment & Plan:   Principal Problem:   Chest pain Active Problems:   Positive D dimer   CKD (chronic kidney disease), stage IV (HCC)   History of CVA (cerebrovascular accident)   Acute on chronic diastolic heart failure (HCC)   Acute hyperkalemia   Uncontrolled hypertension   Sinus bradycardia   Chest pain -cardiology consulted- no further intervention currently planned -echo :Left ventricle: The cavity size was normal. Wall thickness was   normal. Systolic function was normal. The estimated ejection   fraction was in the range of 60% to 65%. Wall motion was normal;   there were no regional wall motion abnormalities. Features are   consistent with a pseudonormal left ventricular filling pattern,   with concomitant abnormal relaxation and increased filling   pressure (grade 2 diastolic dysfunction). - Aortic valve: Trileaflet; mildly thickened, mildly calcified   leaflets. - Mitral valve: Moderately calcified annulus. - Compared to the prior study, there has been no significant   interval change.  Right sided weakness -check MRI of brain  Hyponatremia -? Etiology- d/c  zoloft, gentle IVF -fluid restrict -Urine osmo/ urine Na- urine studies pending -serum osmos: 279 -TSH normal -holding diuretics    Positive D dimer -Mildly elevated at 0.67 -V/Q scan negative  COPD -steroids- wean as tolerated -nebs  Headache -IV cocktail improved but not resolved    Uncontrolled hypertension -Likely contributing to chest pressure/pain -Continue preadmission beta blocker but given bradycardia cannot increase dosage further -Begin scheduled hydralazine 50 mg every 8 hours    CKD (chronic kidney disease), stage IV / Acute hyperkalemia -Renal function is stable and at baseline -hyperkalemia resolved    Chronic diastolic heart failure, NYHA class 1  -Chest x-ray negative -echo as above    Sinus bradycardia -Chronic issue in setting of utilization of beta blocker    History of CVA (cerebrovascular accident) -Continue aspirin -Not on statin   ? Alcohol use- family reported drug abuse as well but  database clear -CIWA protocol    DVT prophylaxis:  Lovenox  Code Status: Full Code   Family Communication: At bedside  Disposition Plan:  SNF   Consultants:       Subjective: Head ache better but now with right sided weakness that causes her to fall to the right  Objective: Vitals:   06/03/16 0558 06/03/16 0617 06/03/16 0825 06/03/16 0847  BP: 131/64 131/64 131/60   Pulse:  69    Resp:      Temp:  97.7 F (36.5 C)    TempSrc:  Oral    SpO2:  100% 98% 99%  Weight:  53.8 kg (118 lb 8 oz)    Height:        Intake/Output Summary (Last 24 hours)  at 06/03/16 1557 Last data filed at 06/03/16 0945  Gross per 24 hour  Intake              582 ml  Output                0 ml  Net              582 ml   Filed Weights   05/31/16 0450 06/02/16 0633 06/03/16 0617  Weight: 49.6 kg (109 lb 4.8 oz) 51.2 kg (112 lb 12.8 oz) 53.8 kg (118 lb 8 oz)    Examination:  General exam: tremors resolved Respiratory system: moving more air, no  wheezing Cardiovascular system: S1 & S2 heard, RRR. No JVD, murmurs, rubs, gallops or clicks. No pedal edema. Gastrointestinal system: Abdomen is nondistended, soft and nontender. No organomegaly or masses felt. Normal bowel sounds heard. Central nervous system: strength equal in all 4 extremities, sensation intact     Data Reviewed: I have personally reviewed following labs and imaging studies  CBC:  Recent Labs Lab 05/30/16 1100 06/01/16 0552 06/02/16 0515  WBC 8.0 8.0 23.0*  HGB 12.2 11.2* 11.0*  HCT 38.5 36.0 33.6*  MCV 100.0 98.4 95.5  PLT 271 271 347   Basic Metabolic Panel:  Recent Labs Lab 06/01/16 0552 06/02/16 0515 06/02/16 1513 06/02/16 2234 06/03/16 0350  NA 133* 125* 122* 123* 123*  K 4.1 4.7 4.9 5.1 4.7  CL 102 95* 92* 92* 92*  CO2 18* 19* 20* 20* 20*  GLUCOSE 174* 149* 131* 155* 110*  BUN 32* 40* 42* 42* 42*  CREATININE 2.31* 2.40* 2.51* 2.43* 2.44*  CALCIUM 9.8 9.8 9.5 9.5 9.1   GFR: Estimated Creatinine Clearance: 14.1 mL/min (A) (by C-G formula based on SCr of 2.44 mg/dL (H)). Liver Function Tests: No results for input(s): AST, ALT, ALKPHOS, BILITOT, PROT, ALBUMIN in the last 168 hours. No results for input(s): LIPASE, AMYLASE in the last 168 hours. No results for input(s): AMMONIA in the last 168 hours. Coagulation Profile: No results for input(s): INR, PROTIME in the last 168 hours. Cardiac Enzymes:  Recent Labs Lab 05/30/16 1430 05/30/16 1955 05/31/16 0202  CKTOTAL 30*  --   --   TROPONINI <0.03 <0.03 <0.03   BNP (last 3 results) No results for input(s): PROBNP in the last 8760 hours. HbA1C: No results for input(s): HGBA1C in the last 72 hours. CBG:  Recent Labs Lab 06/02/16 1148 06/02/16 1606 06/02/16 2225 06/03/16 0741 06/03/16 1101  GLUCAP 149* 152* 157* 96 89   Lipid Profile: No results for input(s): CHOL, HDL, LDLCALC, TRIG, CHOLHDL, LDLDIRECT in the last 72 hours. Thyroid Function Tests: No results for input(s):  TSH, T4TOTAL, FREET4, T3FREE, THYROIDAB in the last 72 hours. Anemia Panel: No results for input(s): VITAMINB12, FOLATE, FERRITIN, TIBC, IRON, RETICCTPCT in the last 72 hours. Urine analysis:    Component Value Date/Time   COLORURINE YELLOW 10/15/2014 [place] 10/15/2014 1349   LABSPEC 1.011 10/15/2014 1349   PHURINE 5.0 10/15/2014 1349   GLUCOSEU NEGATIVE 10/15/2014 1349   HGBUR TRACE (A) 10/15/2014 1349   HGBUR small 09/25/2008 1224   BILIRUBINUR Negative 05/20/2015 1507   KETONESUR NEGATIVE 10/15/2014 1349   PROTEINUR Trace 05/20/2015 1507   PROTEINUR NEGATIVE 10/15/2014 1349   UROBILINOGEN 0.2 05/20/2015 1507   UROBILINOGEN 0.2 10/15/2014 1349   NITRITE Negative 05/20/2015 1507   NITRITE NEGATIVE 10/15/2014 1349   LEUKOCYTESUR Negative 05/20/2015 1507     )No results found  for this or any previous visit (from the past 240 hour(s)).    Anti-infectives    None       Radiology Studies: No results found.      Scheduled Meds: . aspirin EC  81 mg Oral Daily  . bisoprolol  5 mg Oral Daily  . calcitRIOL  0.25 mcg Oral Once per day on Mon Wed Fri  . cholecalciferol  1,000 Units Oral Daily  . enoxaparin (LOVENOX) injection  30 mg Subcutaneous Q24H  . folic acid  1 mg Oral Daily  . gabapentin  100 mg Oral BID  . hydrALAZINE  50 mg Oral Q8H  . insulin aspart  0-5 Units Subcutaneous QHS  . insulin aspart  0-9 Units Subcutaneous TID WC  . multivitamin with minerals  1 tablet Oral Daily  . oyster calcium  500 mg of elemental calcium Oral Q breakfast  . predniSONE  30 mg Oral Q breakfast  . prochlorperazine  10 mg Intravenous Once  . thiamine  100 mg Oral Daily   Continuous Infusions: . sodium chloride 75 mL/hr at 06/03/16 0258     LOS: 3 days    Time spent:  25 min    [place], DO [facility] Pager [phone]  If 7PM-7AM, please contact night-coverage [email or web address] Password [facility] 06/03/2016, 3:57 PM

## 2016-06-03 NOTE — Progress Notes (Signed)
Physical Therapy Treatment Patient Details Name: Nicole English MRN: [id number] DOB: [birth year] Today's Date: 06/03/2016    History of Present Illness Pt adm with chest pain. Cardiac work up negative.  PMH - chf, DDD, copd, htn, cva, ckd    PT Comments    Pt progressing towards physical therapy goals. Pt reports this session that she does not feel safe returning home with the level of supervision she would have available. Discussed the option of SNF for continued rehab and pt agreed she felt more comfortable planning on rehab before returning home. Also discussed pt case with Cardiac Mobility Tech who walked with pt earlier today and he states he felt pt could benefit from SNF at d/c. Pt used RW with PT this session which minimized staggering, however pt continues to demonstrate significant balance deficits which increase risk for falls. CSW updated at end of session. Will continue to follow and progress as able per POC.    Follow Up Recommendations  SNF;Supervision for mobility/OOB     Equipment Recommendations  None recommended by PT    Recommendations for Other Services       Precautions / Restrictions Precautions Precautions: Fall Restrictions Weight Bearing Restrictions: No    Mobility  Bed Mobility Overal bed mobility: Modified Independent                Transfers Overall transfer level: Needs assistance Equipment used: None Transfers: Sit to/from Stand Sit to Stand: Min guard         General transfer comment: Hands-on guarding for safety as pt powered-up to full standing position.   Ambulation/Gait Ambulation/Gait assistance: Min assist Ambulation Distance (Feet): 200 Feet Assistive device: Rolling walker (2 wheeled) Gait Pattern/deviations: Step-through pattern;Staggering right;Drifts right/left Gait velocity: decreased Gait velocity interpretation: Below normal speed for age/gender General Gait Details: Occasional assist provided due to listing to the  right with dynamic balance activity. Pt performed horizontal and vertical head turns with increase in R sided lean. With RW, staggering was minimized.   Stairs            Wheelchair Mobility    Modified Rankin (Stroke Patients Only)       Balance Overall balance assessment: Needs assistance Sitting-balance support: Feet supported;No upper extremity supported Sitting balance-Leahy Scale: Good     Standing balance support: Single extremity supported;Bilateral upper extremity supported;No upper extremity supported Standing balance-Leahy Scale: Good Standing balance comment: statically good, significant increase in sway with eyes closed in standing.  Pt reports good sensation in her feet.                             Cognition Arousal/Alertness: Awake/alert Behavior During Therapy: WFL for tasks assessed/performed Overall Cognitive Status: Within Functional Limits for tasks assessed                                        Exercises      General Comments        Pertinent Vitals/Pain Pain Assessment: Faces Faces Pain Scale: No hurt    Home Living                      Prior Function            PT Goals (current goals can now be found in the care plan section) Acute Rehab PT Goals  Patient Stated Goal: return home after rehab at SNF PT Goal Formulation: With patient Time For Goal Achievement: 06/08/16 Potential to Achieve Goals: Good Progress towards PT goals: Progressing toward goals    Frequency    Min 3X/week      PT Plan Discharge plan needs to be updated    Co-evaluation              AM-PAC PT "6 Clicks" Daily Activity  Outcome Measure  Difficulty turning over in bed (including adjusting bedclothes, sheets and blankets)?: None Difficulty moving from lying on back to sitting on the side of the bed? : None Difficulty sitting down on and standing up from a chair with arms (e.g., wheelchair, bedside commode,  etc,.)?: None Help needed moving to and from a bed to chair (including a wheelchair)?: A Little Help needed walking in hospital room?: A Little Help needed climbing 3-5 steps with a railing? : A Little 6 Click Score: 21    End of Session Equipment Utilized During Treatment: Gait belt Activity Tolerance: Patient tolerated treatment well Patient left: in chair;with call bell/phone within reach;with chair alarm set Nurse Communication: Mobility status PT Visit Diagnosis: Unsteadiness on feet (R26.81);Dizziness and giddiness (R42)     Time: 4103-0131 PT Time Calculation (min) (ACUTE ONLY): 28 min  Charges:  $Gait Training: 23-37 mins                    G Codes:       Rolinda Roan, PT, DPT Acute Rehabilitation Services Pager: [phone]    Thelma Comp 06/03/2016, 3:20 PM

## 2016-06-03 NOTE — Progress Notes (Signed)
Progress Note  Patient Name: Nicole English Date of Encounter: 06/03/2016  Primary Cardiologist: Dr. Martinique  Subjective   The patient has been up walking in the halls, feels a little "woozy" with walking, no shortness of breath or chest pain.   Inpatient Medications    Scheduled Meds: . aspirin EC  81 mg Oral Daily  . bisoprolol  5 mg Oral Daily  . calcitRIOL  0.25 mcg Oral Once per day on Mon Wed Fri  . cholecalciferol  1,000 Units Oral Daily  . enoxaparin (LOVENOX) injection  30 mg Subcutaneous Q24H  . folic acid  1 mg Oral Daily  . gabapentin  100 mg Oral BID  . hydrALAZINE  50 mg Oral Q8H  . insulin aspart  0-5 Units Subcutaneous QHS  . insulin aspart  0-9 Units Subcutaneous TID WC  . multivitamin with minerals  1 tablet Oral Daily  . oyster calcium  500 mg of elemental calcium Oral Q breakfast  . predniSONE  30 mg Oral Q breakfast  . prochlorperazine  10 mg Intravenous Once  . thiamine  100 mg Oral Daily   Continuous Infusions: . sodium chloride 75 mL/hr at 06/03/16 0258   PRN Meds: acetaminophen, albuterol, hydrALAZINE, LORazepam **OR** LORazepam, morphine injection, nitroGLYCERIN, ondansetron (ZOFRAN) IV, traMADol   Vital Signs    Vitals:   06/02/16 2222 06/03/16 0558 06/03/16 0617 06/03/16 0847  BP: 123/60 131/64 131/64   Pulse: 87  69   Resp:      Temp: 97.8 F (36.6 C)  97.7 F (36.5 C)   TempSrc: Oral  Oral   SpO2: 97%  100% 99%  Weight:   118 lb 8 oz (53.8 kg)   Height:        Intake/Output Summary (Last 24 hours) at 06/03/16 0910 Last data filed at 06/02/16 1600  Gross per 24 hour  Intake              480 ml  Output                0 ml  Net              480 ml   Filed Weights   05/31/16 0450 06/02/16 0633 06/03/16 0617  Weight: 109 lb 4.8 oz (49.6 kg) 112 lb 12.8 oz (51.2 kg) 118 lb 8 oz (53.8 kg)    Telemetry    Sinus rhtyhm in the 60's -70's, one short run of irregular narrow complex tachycardia, some p waves seen - Personally  Reviewed  ECG    No new tracings  Physical Exam   GEN: No acute distress.   Neck: No JVD Cardiac: RRR, no murmurs, rubs, or gallops.  Respiratory: Clear to auscultation bilaterally. GI: Soft, nontender, non-distended  MS: No edema; No deformity. Neuro:  Nonfocal  Psych: Normal affect   Labs    Chemistry Recent Labs Lab 06/02/16 1513 06/02/16 2234 06/03/16 0350  NA 122* 123* 123*  K 4.9 5.1 4.7  CL 92* 92* 92*  CO2 20* 20* 20*  GLUCOSE 131* 155* 110*  BUN 42* 42* 42*  CREATININE 2.51* 2.43* 2.44*  CALCIUM 9.5 9.5 9.1  GFRNONAA 17* 17* 17*  GFRAA 19* 20* 20*  ANIONGAP 10 11 11      Hematology Recent Labs Lab 05/30/16 1100 06/01/16 0552 06/02/16 0515  WBC 8.0 8.0 23.0*  RBC 3.85* 3.66* 3.52*  HGB 12.2 11.2* 11.0*  HCT 38.5 36.0 33.6*  MCV 100.0 98.4 95.5  MCH 31.7  30.6 31.3  MCHC 31.7 31.1 32.7  RDW 13.4 12.9 13.2  PLT 271 271 271    Cardiac Enzymes Recent Labs Lab 05/30/16 1430 05/30/16 1955 05/31/16 0202  TROPONINI <0.03 <0.03 <0.03    Recent Labs Lab 05/30/16 1107  TROPIPOC 0.00     BNPNo results for input(s): BNP, PROBNP in the last 168 hours.   DDimer  Recent Labs Lab 05/30/16 1213  DDIMER 0.67*     Radiology    No results found.  Cardiac Studies   Echo 05/31/16 Study Conclusions  - Left ventricle: The cavity size was normal. Wall thickness was   normal. Systolic function was normal. The estimated ejection   fraction was in the range of 60% to 65%. Wall motion was normal;   there were no regional wall motion abnormalities. Features are   consistent with a pseudonormal left ventricular filling pattern,   with concomitant abnormal relaxation and increased filling   pressure (grade 2 diastolic dysfunction). - Aortic valve: Trileaflet; mildly thickened, mildly calcified   leaflets. - Mitral valve: Moderately calcified annulus.  Impressions: - Compared to the prior study, there has been no significant   interval  change.   Patient Profile     83 y.o. female who presented with chest pain on 5/13 that started suddenly with some dyspnea. The patient has a past medical history significant for hypertension, diastolic heart failure, CKD stage IV on lasix, COPD, osteoporosis, chronic drug related bradycardia, CVA, diverticular bleed.  Assessment & Plan    Chest pain -Rules out for MI. Troponins were negative for 4 occurrences and EKG without ischemic changes. Her chest pain has resolved. VQ scan showed low probability of PE. Pt has hsitory of diverticular bleed and is not an anticoagulation candidate.  Chronic diastolic heart failure -Echo showed EF 60-65% with no RMWA, grade 2 DD -No signs of fluid overload  Dyspnea -Last CXR 5/13 showed no infiltrates or edema -On prednisone pre IM -Currently no dyspnea  Hypertension -blood pressures well controlled on hydralazine 50 mg TID, bisoprolol 5 mg daily,   Hyponatremia -Sodium 123, has been low for the last 2 days. Down from 133 on 5/15 -?etiology, being evaluated by IM with Urine osmo/serum osmo, urine Na -Per IM could be related to zoloft -Per IM gentle IV fluids and oral fluid restriction -Holding diuretic (last dose of lasix was yesterday morning)  Signed, Daune Perch, NP  06/03/2016, 9:10 AM    I have seen and examined the patient along with Daune Perch, NP.  I have reviewed the chart, notes and new data.  I agree with NP's note.  Key new complaints: no breathing difficulty Key examination changes: clinically euvolemic Key new findings / data: persistent hyponatremia.  PLAN: Does not need diuretics today. Waiting for labs for hyponatremia workup. Plan furosemide 20 mg/alternating 30 mg daily at DC, if/when Na issue is figured out.  Sanda Klein, MD, [place] [phone] 06/03/2016, 11:54 AM

## 2016-06-04 DIAGNOSIS — J441 Chronic obstructive pulmonary disease with (acute) exacerbation: Secondary | ICD-10-CM

## 2016-06-04 LAB — GLUCOSE, CAPILLARY
GLUCOSE-CAPILLARY: 101 mg/dL — AB (ref 65–99)
Glucose-Capillary: 91 mg/dL (ref 65–99)

## 2016-06-04 LAB — CBC
HEMATOCRIT: 32.6 % — AB (ref 36.0–46.0)
HEMOGLOBIN: 10.7 g/dL — AB (ref 12.0–15.0)
MCH: 31.1 pg (ref 26.0–34.0)
MCHC: 32.8 g/dL (ref 30.0–36.0)
MCV: 94.8 fL (ref 78.0–100.0)
Platelets: 257 10*3/uL (ref 150–400)
RBC: 3.44 MIL/uL — ABNORMAL LOW (ref 3.87–5.11)
RDW: 12.7 % (ref 11.5–15.5)
WBC: 10.3 10*3/uL (ref 4.0–10.5)

## 2016-06-04 LAB — BASIC METABOLIC PANEL
Anion gap: 9 (ref 5–15)
BUN: 43 mg/dL — AB (ref 6–20)
CHLORIDE: 99 mmol/L — AB (ref 101–111)
CO2: 21 mmol/L — AB (ref 22–32)
CREATININE: 2.33 mg/dL — AB (ref 0.44–1.00)
Calcium: 9.1 mg/dL (ref 8.9–10.3)
GFR calc Af Amer: 21 mL/min — ABNORMAL LOW (ref >60)
GFR calc non Af Amer: 18 mL/min — ABNORMAL LOW (ref >60)
GLUCOSE: 92 mg/dL (ref 65–99)
Potassium: 5 mmol/L (ref 3.5–5.1)
SODIUM: 129 mmol/L — AB (ref 135–145)

## 2016-06-04 LAB — SODIUM, URINE, RANDOM: SODIUM UR: 33 mmol/L

## 2016-06-04 LAB — CREATININE, URINE, RANDOM: CREATININE, URINE: 14.16 mg/dL

## 2016-06-04 MED ORDER — HYDRALAZINE HCL 50 MG PO TABS: 50.0000 mg | ORAL_TABLET | Freq: Three times a day (TID) | ORAL | Status: DC

## 2016-06-04 MED ORDER — DIPHENHYDRAMINE HCL 12.5 MG/5ML PO ELIX
12.5000 mg | ORAL_SOLUTION | Freq: Once | ORAL | Status: AC
  Administered 2016-06-04: 12.5 mg via ORAL
  Filled 2016-06-04: qty 10

## 2016-06-04 MED ORDER — PREDNISONE 10 MG PO TABS: 30.0000 mg | ORAL_TABLET | Freq: Every day | ORAL | 0 refills | Status: AC

## 2016-06-04 NOTE — Care Management Note (Signed)
Case Management Note  Patient Details  Name: TALAYA LAMPRECHT MRN: [id number] Date of Birth: Oct 19, 1932  Subjective/Objective: Pt presented for Chest Pain. Plan for d/c to SNF 06-04-16.                    Action/Plan: CSW assisting with disposition needs. No further needs from CM at this time.   Expected Discharge Date:  06/04/16               Expected Discharge Plan:  Skilled Nursing Facility  In-House Referral:  Clinical Social Work  Discharge planning Services  CM Consult  Post Acute Care Choice:  NA Choice offered to:  NA  DME Arranged:  N/A DME Agency:  NA  HH Arranged:  NA HH Agency:  NA  Status of Service:  Completed, signed off  If discussed at [employer] of Stay Meetings, dates discussed:    Additional Comments:  Bethena Roys, RN 06/04/2016, 3:20 PM

## 2016-06-04 NOTE — Care Management Important Message (Signed)
Important Message  Patient Details  Name: Nicole English MRN: [id number] Date of Birth: 1932/09/26   Medicare Important Message Given:  Yes    Nathen May 06/04/2016, 3:23 PM

## 2016-06-04 NOTE — Progress Notes (Signed)
Clinical Social Worker facilitated patient discharge including contacting patient family and facility to confirm patient discharge plans.  Clinical information faxed to facility and family agreeable with plan.  CSW arranged ambulance transport via [facility] to [facility] .  RN to call [phone] (pt will be placed in room 102A)report prior to discharge.  Clinical Social Worker will sign off for now as social work intervention is no longer needed. Please consult [place] again if new need arises.  Rhea Pink, MSW, [place]

## 2016-06-04 NOTE — NC FL2 (Signed)
[place] LEVEL OF CARE SCREENING TOOL     IDENTIFICATION  Patient Name: Nicole English Birthdate: 1932/09/11 Sex: female Admission Date (Current Location): 05/30/2016  [facility] [place] Number:  Herbalist and Address:  [facility] [place], [place] [address] [place], [place] [address]      Provider Number: [id number]  Attending Physician Name and Address:  Patrecia Pour, MD  Relative Name and Phone Number:       Current Level of Care: Hospital Recommended Level of Care: [place] Prior Approval Number:    Date Approved/Denied:   PASRR Number: [id number]  Discharge Plan: SNF    Current Diagnoses: Patient Active Problem List   Diagnosis Date Noted  . Positive D dimer 05/30/2016  . CKD (chronic kidney disease), stage IV ([place]) 05/30/2016  . Acute on chronic diastolic heart failure ([place]) 05/30/2016  . Acute hyperkalemia 05/30/2016  . Uncontrolled hypertension 05/30/2016  . Sinus bradycardia 05/30/2016  . Left hip pain 04/02/2016  . Secondary hyperparathyroidism of renal origin ([place]) 03/15/2016  . Abdominal aortic atherosclerosis ([place]) 07/19/2015  . Stool incontinence 06/26/2015  . Urinary incontinence 05/21/2015  . Rib pain on right side 05/21/2015  . Advanced care planning/counseling discussion 03/07/2015  . Paresthesias 09/04/2014  . Alcohol use 09/04/2014  . Ruptured silicone breast implant 05/02/2014  . Hyponatremia 03/25/2014  . Diarrhea 12/12/2013  . Chest pain 03/26/2013  . Bradycardia 08/18/2012  . Syncope and collapse 04/06/2012  . Chronic back pain 03/29/2012  . COPD GOLD III 10/26/2011  . Diastolic dysfunction   . Medicare annual wellness visit, subsequent 12/09/2010  . Dysphagia 07/10/2010  . History of diverticulitis of colon   . HLD (hyperlipidemia)   . Anxiety   . MDD (major depressive disorder) ([place])   . GERD (gastroesophageal reflux disease)   . IBS (irritable bowel syndrome)   .  Allergic rhinitis   . Migraine   . HTN (hypertension)   . SUI (stress urinary incontinence, female)   . History of CVA (cerebrovascular accident) 01/18/2010  . CKD (chronic kidney disease) stage 4, GFR 15-29 ml/min (HCC) 12/01/2009  . Vitamin D deficiency 08/21/2008  . Anemia in chronic kidney disease 08/21/2008  . Osteoporosis 05/19/2007  . Insomnia 05/19/2007    Orientation RESPIRATION BLADDER Height & Weight     Self, Time, Situation, Place  Normal Continent Weight: 118 lb 9.6 oz (53.8 kg) Height:  5\' 2"  (157.5 cm)  BEHAVIORAL SYMPTOMS/MOOD NEUROLOGICAL BOWEL NUTRITION STATUS      Continent Diet (heart room)  AMBULATORY STATUS COMMUNICATION OF NEEDS Skin   Limited Assist Verbally Normal                       Personal Care Assistance Level of Assistance  Bathing, Feeding, Dressing Bathing Assistance: Limited assistance Feeding assistance: Independent Dressing Assistance: Limited assistance     Functional Limitations Info  Sight, Hearing, Speech Sight Info: Adequate Hearing Info: Adequate Speech Info: Adequate    SPECIAL CARE FACTORS FREQUENCY  PT (By licensed PT), OT (By licensed OT)     PT Frequency: 5x wk OT Frequency: 5x wk            Contractures Contractures Info: Not present    Additional Factors Info  Code Status, Allergies Code Status Info: full code Allergies Info: AMLODIPINE, DOXYCYCLINE MONOHYDRATE, RAMIPRIL           Current Medications (06/04/2016):  This is the current hospital active  medication list Current Facility-Administered Medications  Medication Dose Route Frequency Provider Last Rate Last Dose  . 0.9 %  sodium chloride infusion   Intravenous Continuous Geradine Girt, DO 75 mL/hr at 06/03/16 2000    . acetaminophen (TYLENOL) tablet 650 mg  650 mg Oral Q4H PRN Samella Parr, NP   650 mg at 06/03/16 0604  . albuterol (PROVENTIL) (2.5 MG/3ML) 0.083% nebulizer solution 2.5 mg  2.5 mg Nebulization Q2H PRN Geradine Girt, DO       . aspirin EC tablet 81 mg  81 mg Oral Daily Samella Parr, NP   81 mg at 06/04/16 0855  . bisoprolol (ZEBETA) tablet 5 mg  5 mg Oral Daily Samella Parr, NP   5 mg at 06/04/16 0856  . calcitRIOL (ROCALTROL) capsule 0.25 mcg  0.25 mcg Oral Once per day on Mon Wed Fri Erin Hearing L, NP   0.25 mcg at 06/04/16 1049  . cholecalciferol (VITAMIN D) tablet 1,000 Units  1,000 Units Oral Daily Samella Parr, NP   1,000 Units at 06/04/16 0855  . enoxaparin (LOVENOX) injection 30 mg  30 mg Subcutaneous Q24H Renold Genta D, RPH   30 mg at 06/03/16 1640  . folic acid (FOLVITE) tablet 1 mg  1 mg Oral Daily Eulogio Bear U, DO   1 mg at 06/04/16 0855  . gabapentin (NEURONTIN) capsule 100 mg  100 mg Oral BID Samella Parr, NP   100 mg at 06/04/16 0856  . hydrALAZINE (APRESOLINE) injection 10 mg  10 mg Intravenous Q6H PRN Samella Parr, NP   10 mg at 05/30/16 1400  . hydrALAZINE (APRESOLINE) tablet 50 mg  50 mg Oral Q8H Samella Parr, NP   50 mg at 06/04/16 5732  . insulin aspart (novoLOG) injection 0-5 Units  0-5 Units Subcutaneous QHS Vann, Jessica U, DO      . insulin aspart (novoLOG) injection 0-9 Units  0-9 Units Subcutaneous TID WC Vann, Jessica U, DO   2 Units at 06/02/16 1704  . LORazepam (ATIVAN) tablet 1 mg  1 mg Oral Q6H PRN Eulogio Bear U, DO       Or  . LORazepam (ATIVAN) injection 1 mg  1 mg Intravenous Q6H PRN Eulogio Bear U, DO      . morphine 2 MG/ML injection 1 mg  1 mg Intravenous Q2H PRN Samella Parr, NP   1 mg at 05/30/16 2148  . multivitamin with minerals tablet 1 tablet  1 tablet Oral Daily Eulogio Bear U, DO   1 tablet at 06/04/16 0855  . nitroGLYCERIN (NITROSTAT) SL tablet 0.4 mg  0.4 mg Sublingual Q5 min PRN Elwin Mocha, MD      . ondansetron [facility]) injection 4 mg  4 mg Intravenous Q6H PRN Samella Parr, NP      . oyster calcium tablet 500 mg of elemental calcium  500 mg of elemental calcium Oral Q breakfast Samella Parr, NP   500 mg of  elemental calcium at 06/04/16 0852  . predniSONE (DELTASONE) tablet 30 mg  30 mg Oral Q breakfast Eulogio Bear U, DO   30 mg at 06/04/16 2025  . prochlorperazine (COMPAZINE) injection 10 mg  10 mg Intravenous Once Vann, Jessica U, DO      . thiamine (VITAMIN B-1) tablet 100 mg  100 mg Oral Daily Vann, Jessica U, DO   100 mg at 06/04/16 0856  . traMADol (ULTRAM) tablet 50 mg  50 mg  Oral BID PRN Samella Parr, NP   50 mg at 06/02/16 2236     Discharge Medications: Please see discharge summary for a list of discharge medications.  Relevant Imaging Results:  Relevant Lab Results:   Additional Information [id number]  Wende Neighbors, LCSW

## 2016-06-04 NOTE — Progress Notes (Signed)
Progress Note  Patient Name: Nicole English Date of Encounter: 06/04/2016  Primary Cardiologist: Dr. Martinique  Subjective   The patient has been up walking in the hall and room with mild shortness of breath and pulling to the right with walking. No further chest pain.  Inpatient Medications    Scheduled Meds: . aspirin EC  81 mg Oral Daily  . bisoprolol  5 mg Oral Daily  . calcitRIOL  0.25 mcg Oral Once per day on Mon Wed Fri  . cholecalciferol  1,000 Units Oral Daily  . enoxaparin (LOVENOX) injection  30 mg Subcutaneous Q24H  . folic acid  1 mg Oral Daily  . gabapentin  100 mg Oral BID  . hydrALAZINE  50 mg Oral Q8H  . insulin aspart  0-5 Units Subcutaneous QHS  . insulin aspart  0-9 Units Subcutaneous TID WC  . multivitamin with minerals  1 tablet Oral Daily  . oyster calcium  500 mg of elemental calcium Oral Q breakfast  . predniSONE  30 mg Oral Q breakfast  . prochlorperazine  10 mg Intravenous Once  . thiamine  100 mg Oral Daily   Continuous Infusions: . sodium chloride 75 mL/hr at 06/03/16 2000   PRN Meds: acetaminophen, albuterol, hydrALAZINE, LORazepam **OR** LORazepam, morphine injection, nitroGLYCERIN, ondansetron (ZOFRAN) IV, traMADol   Vital Signs    Vitals:   06/03/16 1230 06/03/16 1618 06/03/16 2115 06/04/16 0500  BP:  (!) 143/66 (!) 141/66 (!) 144/69  Pulse:  69 68 70  Resp:      Temp:  98.5 F (36.9 C) 97.9 F (36.6 C) 98.3 F (36.8 C)  TempSrc:  Oral Oral Oral  SpO2: 99% 99% 100% 98%  Weight:    118 lb 9.6 oz (53.8 kg)  Height:        Intake/Output Summary (Last 24 hours) at 06/04/16 0850 Last data filed at 06/04/16 0500  Gross per 24 hour  Intake          4058.25 ml  Output              800 ml  Net          3258.25 ml   Filed Weights   06/02/16 0633 06/03/16 0617 06/04/16 0500  Weight: 112 lb 12.8 oz (51.2 kg) 118 lb 8 oz (53.8 kg) 118 lb 9.6 oz (53.8 kg)    Telemetry    Sinus rhtyhm in the 60's -70's, - Personally Reviewed  ECG     No new tracings  Physical Exam   GEN: No acute distress.   Neck: No JVD Cardiac: RRR, no murmurs, rubs, or gallops.  Respiratory: Clear to auscultation bilaterally. GI: Soft, nontender, non-distended  MS: No edema; No deformity. Neuro:  Nonfocal  Psych: Normal affect   Labs    Chemistry  Recent Labs Lab 06/03/16 0350 06/03/16 1617 06/04/16 0520  NA 123* 122* 129*  K 4.7 5.9* 5.0  CL 92* 95* 99*  CO2 20* 17* 21*  GLUCOSE 110* 128* 92  BUN 42* 45* 43*  CREATININE 2.44* 2.41* 2.33*  CALCIUM 9.1 9.4 9.1  GFRNONAA 17* 18* 18*  GFRAA 20* 20* 21*  ANIONGAP 11 10 9      Hematology  Recent Labs Lab 06/01/16 0552 06/02/16 0515 06/04/16 0520  WBC 8.0 23.0* 10.3  RBC 3.66* 3.52* 3.44*  HGB 11.2* 11.0* 10.7*  HCT 36.0 33.6* 32.6*  MCV 98.4 95.5 94.8  MCH 30.6 31.3 31.1  MCHC 31.1 32.7 32.8  RDW 12.9  13.2 12.7  PLT 271 271 257    Cardiac Enzymes  Recent Labs Lab 05/30/16 1430 05/30/16 1955 05/31/16 0202  TROPONINI <0.03 <0.03 <0.03     Recent Labs Lab 05/30/16 1107  TROPIPOC 0.00     BNPNo results for input(s): BNP, PROBNP in the last 168 hours.   DDimer   Recent Labs Lab 05/30/16 1213  DDIMER 0.67*     Radiology    Mr Brain Wo Contrast  Result Date: 06/03/2016 CLINICAL DATA:  RIGHT chest pressure, difficulty breathing, dizziness. History of hypertension, chronic kidney disease, hyperlipidemia, stroke. EXAM: MRI HEAD WITHOUT CONTRAST TECHNIQUE: Multiplanar, multiecho pulse sequences of the brain and surrounding structures were obtained without intravenous contrast. COMPARISON:  CT HEAD October 16, 2014 FINDINGS: BRAIN: No reduced diffusion to suggest acute ischemia. No susceptibility artifact to suggest hemorrhage. The ventricles and sulci are normal for patient's age. No suspicious parenchymal signal, masses or mass effect. No abnormal extra-axial fluid collections. VASCULAR: Normal major intracranial vascular flow voids present at skull  base. SKULL AND UPPER CERVICAL SPINE: No abnormal sellar expansion. No suspicious calvarial bone marrow signal. Craniocervical junction maintained. SINUSES/ORBITS: The mastoid air-cells and included paranasal sinuses are well-aerated. Status post bilateral ocular lens implants, with mild susceptibility artifact, status post bilateral ocular lens implants. OTHER: None. IMPRESSION: No acute intracranial process. Moderate chronic small vessel ischemic disease. Electronically Signed   By: Elon Alas M.D.   On: 06/03/2016 15:56    Cardiac Studies   Echo 05/31/16 Study Conclusions  - Left ventricle: The cavity size was normal. Wall thickness was   normal. Systolic function was normal. The estimated ejection   fraction was in the range of 60% to 65%. Wall motion was normal;   there were no regional wall motion abnormalities. Features are   consistent with a pseudonormal left ventricular filling pattern,   with concomitant abnormal relaxation and increased filling   pressure (grade 2 diastolic dysfunction). - Aortic valve: Trileaflet; mildly thickened, mildly calcified   leaflets. - Mitral valve: Moderately calcified annulus.  Impressions: - Compared to the prior study, there has been no significant   interval change.   Patient Profile     83 y.o. female who presented with chest pain on 5/13 that started suddenly with some dyspnea. The patient has a past medical history significant for hypertension, diastolic heart failure, CKD stage IV on lasix, COPD, osteoporosis, chronic drug related bradycardia, CVA, diverticular bleed.  Assessment & Plan    Chest pain -Ruled out for MI. Troponins were negative for 4 occurrences and EKG without ischemic changes. Her chest pain has resolved. VQ scan showed low probability of PE. Pt has history of diverticular bleed and is not an anticoagulation candidate. No further cardiac work up.  Chronic diastolic heart failure -Echo showed EF 60-65% with no  RMWA, grade 2 DD -No signs of fluid overload  Dyspnea -Last CXR 5/13 showed no infiltrates or edema -On prednisone pre IM -Currently no dyspnea  CKD -Renal function is stable and at baseline, today SCr 2.33 (2.41) -Hyperkalemia has resolved  Hypertension -blood pressures well controlled on hydralazine 50 mg TID, bisoprolol 5 mg daily  Hyponatremia -Sodium level improving 122-->129 -?etiology, being evaluated by IM with Urine osmo/serum osmo, urine Na -Per IM could be related to zoloft -Per IM gentle IV fluids and oral fluid restriction -Holding diuretic (last dose of lasix was 5/16)  Signed, Daune Perch, NP  06/04/2016, 8:50 AM   Pager: [phone]  I have seen and  examined the patient along with Daune Perch, NP.  I have reviewed the chart, notes and new data.  I agree with NP's note.  Key new complaints: no angina, mild exertional dyspnea (baseline) Key examination changes: no clinical evidence of hypervolemia Key new findings / data: Na improved  PLAN: Low urine osmolality and low urine sodium suggest appropriate dilution of urine in setting of hyponatremia (but need U creat to calculate FENa). Ordered urine kytes.  No active cardiac issues. When Na normalized would restart her on her usual diuretic dose (30/20 mg alternating days) with repeat clinical follow up and BMET in 1-2 weeks. Will sign off. Please re-consult as needed.  Sanda Klein, MD, [place] [phone] 06/04/2016, 11:51 AM

## 2016-06-04 NOTE — Discharge Summary (Signed)
Physician Discharge Summary  Nicole English NKN:[id number] DOB: Jan 25, 1932 DOA: 05/30/2016  PCP: Ria Bush, MD  Admit date: 05/30/2016 Discharge date: 06/04/2016  Admitted From: Home Disposition: SNF   Recommendations for Outpatient Follow-up:  1. Follow up with PCP following SNF discharge 2. Monitor BMP, particularly renal function, potassium and sodium.  3. Holding lasix until improvement/resolution of hyponatremia (on fluid restriction and holding SSRI)  Home Health: N/A Equipment/Devices: None Discharge Condition: Stable CODE STATUS: Full Diet recommendation: Heart healthy, 1500cc fluid restriction  Brief/Interim Summary: Nicole English an 83 y.o.femalewith medical history significant for hypertension, chronic diastolic dysfunction, Chronic kidney disease stage IV on Lasix, DDD, osteoporosis, COPD and chronic drug-related bradycardia. She has a prior history of CVA and diverticular bleed. Patient reports beginning 1 day PTA she developed onset of pressure-like right-sided chest pain underneath the right breast that has been constant in nature and she reports this makes it difficult to catch her breath although she denies pleuritic type symptoms. Found to have elevated BP, unchanged echo, negative V/Q but stay complicated by hyponatremia. Work up demonstrated appropriate renal dilution (see below), so isotonic IV fluids were provided and SSRI was held with improvement of sodium to 129. She has no symptoms attributable to hyponatremia. She was noted to be weak and physical therapy was consulted, recommending short stay at [facility] for rehabilitation. Brain MRI was negative. There is no chest pain or dyspnea at time of discharge.   Discharge Diagnoses:  Principal Problem:   Chest pain Active Problems:   Positive D dimer   CKD (chronic kidney disease), stage IV (HCC)   History of CVA (cerebrovascular accident)   Acute on chronic diastolic heart failure (HCC)   Acute  hyperkalemia   Uncontrolled hypertension   Sinus bradycardia  Chest pain: Resolved, suspect due to uncontrolled HTN, since improved.  -cardiology consulted- no further intervention currently planned -echo: Left ventricle: The cavity size was normal. Wall thickness was normal. Systolic function was normal. The estimated ejection fraction was in the range of 60% to 65%. Wall motion was normal; there were no regional wall motion abnormalities. Features are consistent with a pseudonormal left ventricular filling pattern, with concomitant abnormal relaxation and increased filling pressure (grade 2 diastolic dysfunction). - Aortic valve: Trileaflet; mildly thickened, mildly calcified leaflets. - Mitral valve: Moderately calcified annulus. - Compared to the prior study, there has been no significant interval change.  Right sided weakness:  - MRI brain without acute infarct.  - Continue PT  Hyponatremia: Low urine osmolality and low urine sodium suggest appropriate dilution of urine in setting of hyponatremia  - Recommend continue to hold zoloft and recheck early next week.  - Fluid restriction 1500cc/day - TSH wnl - Holding diuretics at discharge. Once sodium level normalizes, cardiology recommends restarting usual diuretic dose (30mg  / 20mg  alternating daily dosage)  Positive D dimer Mildly elevated at 0.67 -V/Q scan negative  COPD - Complete 5 days Tx of prednisone with 3 more doses (5/19 - 5/21) - nebs  Uncontrolled hypertension Likely contributing to chest pressure/pain -Continue preadmission beta blocker but given bradycardia cannot increase dosage further -Begin scheduled hydralazine 50 mg every 8 hours  CKD (chronic kidney disease), stage IV /Acute hyperkalemia - Renal function is stable and at baseline - hyperkalemia resolved  Chronic diastolic heart failure, NYHA class 1  - Chest x-ray negative - echo as above  Sinus bradycardia -  Chronic issue in setting of utilization of beta blocker  History of CVA (cerebrovascular accident) -Continue aspirin -  Not on statin, last LDL 84. Defer management to outpatient setting.   Alcohol use- family reported drug abuse as well but Nicole English database clear - No evidence of withdrawal.   Discharge Instructions  Allergies as of 06/04/2016      Reactions   Amlodipine Other (See Comments)   edema   Doxycycline Monohydrate Nausea And Vomiting   Ramipril Other (See Comments)   Worsening renal function      Medication List    STOP taking these medications   diclofenac sodium 1 % Gel Commonly known as:  VOLTAREN   furosemide 20 MG tablet Commonly known as:  LASIX   traMADol 50 MG tablet Commonly known as:  ULTRAM     TAKE these medications   acetaminophen 500 MG tablet Commonly known as:  TYLENOL Take 1,000 mg by mouth daily as needed (pain).   aspirin EC 81 MG tablet Take 81 mg by mouth daily.   bisoprolol 5 MG tablet Commonly known as:  ZEBETA Take 1 tablet (5 mg total) by mouth daily.   calcitRIOL 0.25 MCG capsule Commonly known as:  ROCALTROL Take 0.25 mcg by mouth 3 (three) times a week.   cholecalciferol 1000 units tablet Commonly known as:  VITAMIN D Take 1,000 Units by mouth daily. Reported on 05/20/2015   gabapentin 100 MG capsule Commonly known as:  NEURONTIN TAKE ONE CAPSULE TWICE A DAY What changed:  See the new instructions.   hydrALAZINE 50 MG tablet Commonly known as:  APRESOLINE Take 1 tablet (50 mg total) by mouth every 8 (eight) hours.   oyster calcium 500 MG Tabs tablet Take 500 mg of elemental calcium by mouth daily. Reported on 05/20/2015   predniSONE 10 MG tablet Commonly known as:  DELTASONE Take 3 tablets (30 mg total) by mouth daily with breakfast. Start taking on:  06/05/2016   sertraline 50 MG tablet Commonly known as:  ZOLOFT TAKE 1 TABLET BY MOUTH EVERY DAY   VSL#3 Caps Take 1 capsule by mouth daily.      Follow-up  Information    Ria Bush, MD. Schedule an appointment as soon as possible for a visit.   Specialty:  Family Medicine Contact information: [place] [address] [phone]          Allergies  Allergen Reactions  . Amlodipine Other (See Comments)    edema  . Doxycycline Monohydrate Nausea And Vomiting  . Ramipril Other (See Comments)     Worsening renal function    Consultations:  Cardiology, Dr. Sallyanne Kuster  Procedures/Studies: Dg Chest 2 View  Result Date: 05/31/2016 CLINICAL DATA:  Short of breath EXAM: CHEST  2 VIEW COMPARISON:  05/30/2016 FINDINGS: Bilateral breast implants. Hyperinflation. No focal infiltrate or effusion. Stable cardiomediastinal silhouette with atherosclerosis. No pneumothorax. IMPRESSION: Hyperinflation without acute infiltrate or edema Electronically Signed   By: Donavan Foil M.D.   On: 05/31/2016 21:44   Dg Chest 2 View  Result Date: 05/30/2016 CLINICAL DATA:  Chest pain EXAM: CHEST  2 VIEW COMPARISON:  10/15/2014 FINDINGS: Heart size within normal limits. Atherosclerotic aorta. Negative for heart failure. Lungs are clear without infiltrate or effusion. COPD with pulmonary hyperinflation. IMPRESSION: No active cardiopulmonary disease. Electronically Signed   By: Franchot Gallo M.D.   On: 05/30/2016 11:30   Mr Brain Wo Contrast  Result Date: 06/03/2016 CLINICAL DATA:  RIGHT chest pressure, difficulty breathing, dizziness. History of hypertension, chronic kidney disease, hyperlipidemia, stroke. EXAM: MRI HEAD WITHOUT CONTRAST TECHNIQUE: Multiplanar, multiecho pulse sequences  of the brain and surrounding structures were obtained without intravenous contrast. COMPARISON:  CT HEAD October 16, 2014 FINDINGS: BRAIN: No reduced diffusion to suggest acute ischemia. No susceptibility artifact to suggest hemorrhage. The ventricles and sulci are normal for patient's age. No suspicious parenchymal signal, masses or mass effect. No  abnormal extra-axial fluid collections. VASCULAR: Normal major intracranial vascular flow voids present at skull base. SKULL AND UPPER CERVICAL SPINE: No abnormal sellar expansion. No suspicious calvarial bone marrow signal. Craniocervical junction maintained. SINUSES/ORBITS: The mastoid air-cells and included paranasal sinuses are well-aerated. Status post bilateral ocular lens implants, with mild susceptibility artifact, status post bilateral ocular lens implants. OTHER: None. IMPRESSION: No acute intracranial process. Moderate chronic small vessel ischemic disease. Electronically Signed   By: Elon Alas M.D.   On: 06/03/2016 15:56   Nm Pulmonary Perf And Vent  Result Date: 05/31/2016 CLINICAL DATA:  Shortness of breath EXAM: NUCLEAR MEDICINE VENTILATION - PERFUSION LUNG SCAN TECHNIQUE: Ventilation images were obtained in multiple projections using inhaled aerosol Tc-68m DTPA. Perfusion images were obtained in multiple projections after intravenous injection of Tc-39m MAA. RADIOPHARMACEUTICALS:  32.8 mCi Technetium-53m DTPA aerosol inhalation and 4.17 mCi Technetium-19m MAA IV COMPARISON:  Radiograph 05/31/2016 FINDINGS: Ventilation: Moderate heterogeneous ventilation with lowers the sensitivity of the study. Perfusion: Small bilateral nonsegmental defects with corresponding abnormal ventilation consistent with matched defects. IMPRESSION: 1. Heterogenous ventilation which lowers the sensitivity of the study. 2. Overall low probability for pulmonary embolus 3. Multiple bilateral matched defects with generalized heterogenous ventilation; the pattern would be consistent with obstructive airways disease. Electronically Signed   By: Donavan Foil M.D.   On: 05/31/2016 21:44   Mm Screening Breast W/implant Tomo Bilateral  Result Date: 05/26/2016 CLINICAL DATA:  Screening. EXAM: 2D DIGITAL SCREENING BILATERAL MAMMOGRAM WITH IMPLANTS, CAD AND ADJUNCT TOMO The patient has implants. Standard and implant  displaced views were performed. COMPARISON:  Previous exam(s). ACR Breast Density Category b: There are scattered areas of fibroglandular density. FINDINGS: There are no findings suspicious for malignancy. Images were processed with CAD. IMPRESSION: No mammographic evidence of malignancy. A result letter of this screening mammogram will be mailed directly to the patient. RECOMMENDATION: Screening mammogram in one year. (Code:SM-B-01Y) BI-RADS CATEGORY  2: Benign. Electronically Signed   By: Abelardo Diesel M.D.   On: 05/26/2016 15:41   Subjective: Pt without chest pain or dyspnea. Wants to go to SNF prior to returning home, has gotten stronger there several times before.   Discharge Exam: BP (!) 142/68 (BP Location: Right Arm)   Pulse 71   Temp 98.1 F (36.7 C) (Oral)   Resp 18   Ht 5\' 2"  (1.575 m)   Wt 53.8 kg (118 lb 9.6 oz)   SpO2 99%   BMI 21.69 kg/m   General: Pt is alert, awake, not in acute distress Cardiovascular: RRR, S1/S2 +, no rubs, no gallops Respiratory: CTA bilaterally, no wheezing, no rhonchi Abdominal: Soft, NT, ND, bowel sounds + Extremities: No edema, no cyanosis  Labs: Basic Metabolic Panel:  Recent Labs Lab 06/02/16 1513 06/02/16 2234 06/03/16 0350 06/03/16 1617 06/04/16 0520  NA 122* 123* 123* 122* 129*  K 4.9 5.1 4.7 5.9* 5.0  CL 92* 92* 92* 95* 99*  CO2 20* 20* 20* 17* 21*  GLUCOSE 131* 155* 110* 128* 92  BUN 42* 42* 42* 45* 43*  CREATININE 2.51* 2.43* 2.44* 2.41* 2.33*  CALCIUM 9.5 9.5 9.1 9.4 9.1   CBC:  Recent Labs Lab 05/30/16 1100 06/01/16 0552 06/02/16 0515  06/04/16 0520  WBC 8.0 8.0 23.0* 10.3  HGB 12.2 11.2* 11.0* 10.7*  HCT 38.5 36.0 33.6* 32.6*  MCV 100.0 98.4 95.5 94.8  PLT 271 271 271 257   Cardiac Enzymes:  Recent Labs Lab 05/30/16 1430 05/30/16 1955 05/31/16 0202  CKTOTAL 30*  --   --   TROPONINI <0.03 <0.03 <0.03   CBG:  Recent Labs Lab 06/03/16 1101 06/03/16 1612 06/03/16 2118 06/04/16 0742 06/04/16 1120   GLUCAP 89 130* 158* 91 101*   Urinalysis    Component Value Date/Time   COLORURINE YELLOW 10/15/2014 [place] 10/15/2014 1349   LABSPEC 1.011 10/15/2014 1349   PHURINE 5.0 10/15/2014 1349   GLUCOSEU NEGATIVE 10/15/2014 1349   HGBUR TRACE (A) 10/15/2014 1349   HGBUR small 09/25/2008 1224   BILIRUBINUR Negative 05/20/2015 1507   KETONESUR NEGATIVE 10/15/2014 1349   PROTEINUR Trace 05/20/2015 1507   PROTEINUR NEGATIVE 10/15/2014 1349   UROBILINOGEN 0.2 05/20/2015 1507   UROBILINOGEN 0.2 10/15/2014 1349   NITRITE Negative 05/20/2015 1507   NITRITE NEGATIVE 10/15/2014 1349   LEUKOCYTESUR Negative 05/20/2015 1507   Time coordinating discharge: Approximately 40 minutes  Vance Gather, MD  [facility] 06/04/2016, 3:09 PM Pager [phone]

## 2016-06-04 NOTE — Clinical Social Work Note (Signed)
Clinical Social Work Assessment  Patient Details  Name: Nicole English MRN: [id number] Date of Birth: [birth year]  Date of referral:  06/04/16               Reason for consult:  Discharge Planning                Permission sought to share information with:  Family Supports Permission granted to share information::  Yes, Verbal Permission Granted  Name::     Nicole English  Agency::     Relationship::  son  Contact Information:  [phone]  Housing/Transportation Living arrangements for the past 2 months:  Single Family Home Source of Information:  Patient Patient Interpreter Needed:  None Criminal Activity/Legal Involvement Pertinent to Current Situation/Hospitalization:  No - Comment as needed Significant Relationships:  Adult Children Lives with:  Self, Adult Children Do you feel safe going back to the place where you live?  Yes Need for family participation in patient care:  Yes (Comment)  Care giving concerns:  Patient stated she lives with her son and daughter in law. patient stated family are supportive but they work all the time so she's at home by herself most days  Social Worker assessment / plan:  Holiday representative met patient at bedside to offer support and discuss patients needs at discharge. Patient stated she is agreeable to go to SNF and would prefer [facility] since she has been there in the past. CSW to complete necessary paperwork and initiate SNF search on patient behalf. CSW to follow up once bed offers are available. Employment status:  Retired Forensic scientist:  Medicare PT Recommendations:  [place] / Referral to community resources:  [place]  Patient/Family's Response to care:  Patient verbalized appreciation and understanding for CSW role in care. Patient in agreement to discharge to SNF   Patient/Family's Understanding of and Emotional Response to Diagnosis, Current Treatment, and Prognosis:   Patient with good understanding of current medical state and limitations. Emotional Assessment Appearance:  Appears stated age Attitude/Demeanor/Rapport:  Other Affect (typically observed):  Pleasant, Calm Orientation:  Oriented to Situation, Oriented to  Time, Oriented to Place, Oriented to Self Alcohol / Substance use:  Not Applicable Psych involvement (Current and /or in the community):  No (Comment)  Discharge Needs  Concerns to be addressed:  No discharge needs identified Readmission within the last 30 days:  No Current discharge risk:  None Barriers to Discharge:  No Barriers Identified   Wende Neighbors, LCSW 06/04/2016, 2:01 PM

## 2016-06-04 NOTE — Progress Notes (Signed)
Report given to nurse at [facility]. Pt to go to room 102A. Pt does not have any complaints or questions at the moment.

## 2016-06-11 DIAGNOSIS — N184 Chronic kidney disease, stage 4 (severe): Secondary | ICD-10-CM | POA: Diagnosis not present

## 2016-06-11 DIAGNOSIS — J449 Chronic obstructive pulmonary disease, unspecified: Secondary | ICD-10-CM | POA: Diagnosis not present

## 2016-06-11 DIAGNOSIS — I10 Essential (primary) hypertension: Secondary | ICD-10-CM | POA: Diagnosis not present

## 2016-06-17 DIAGNOSIS — N184 Chronic kidney disease, stage 4 (severe): Secondary | ICD-10-CM | POA: Diagnosis not present

## 2016-06-17 DIAGNOSIS — J449 Chronic obstructive pulmonary disease, unspecified: Secondary | ICD-10-CM | POA: Diagnosis not present

## 2016-06-17 DIAGNOSIS — I10 Essential (primary) hypertension: Secondary | ICD-10-CM | POA: Diagnosis not present

## 2016-06-17 DIAGNOSIS — Z8673 Personal history of transient ischemic attack (TIA), and cerebral infarction without residual deficits: Secondary | ICD-10-CM | POA: Diagnosis not present

## 2016-06-21 ENCOUNTER — Telehealth: Payer: Self-pay

## 2016-06-21 NOTE — Telephone Encounter (Signed)
Caryl Pina with [facility] left v/m; was going to start [place] over the weekend but pt requested no HH on weekend and nurse will go out on 06/22/16. FYI to Dr Darnell Level.

## 2016-06-21 NOTE — Telephone Encounter (Signed)
Noted  

## 2016-07-01 ENCOUNTER — Encounter: Payer: Self-pay | Admitting: Family Medicine

## 2016-07-01 ENCOUNTER — Ambulatory Visit (INDEPENDENT_AMBULATORY_CARE_PROVIDER_SITE_OTHER): Payer: Medicare Other | Admitting: Family Medicine

## 2016-07-01 VITALS — BP 126/88 | HR 97 | Temp 98.1°F | Wt 107.2 lb

## 2016-07-01 DIAGNOSIS — I5189 Other ill-defined heart diseases: Secondary | ICD-10-CM

## 2016-07-01 DIAGNOSIS — E871 Hypo-osmolality and hyponatremia: Secondary | ICD-10-CM

## 2016-07-01 DIAGNOSIS — N184 Chronic kidney disease, stage 4 (severe): Secondary | ICD-10-CM | POA: Diagnosis not present

## 2016-07-01 DIAGNOSIS — I519 Heart disease, unspecified: Secondary | ICD-10-CM

## 2016-07-01 DIAGNOSIS — R4189 Other symptoms and signs involving cognitive functions and awareness: Secondary | ICD-10-CM

## 2016-07-01 DIAGNOSIS — I10 Essential (primary) hypertension: Secondary | ICD-10-CM

## 2016-07-01 DIAGNOSIS — F3340 Major depressive disorder, recurrent, in remission, unspecified: Secondary | ICD-10-CM

## 2016-07-01 LAB — RENAL FUNCTION PANEL
Albumin: 3.9 g/dL (ref 3.5–5.2)
BUN: 25 mg/dL — AB (ref 6–23)
CO2: 24 mEq/L (ref 19–32)
Calcium: 9.8 mg/dL (ref 8.4–10.5)
Chloride: 107 mEq/L (ref 96–112)
Creatinine, Ser: 2.06 mg/dL — ABNORMAL HIGH (ref 0.40–1.20)
GFR: 24.49 mL/min — ABNORMAL LOW (ref >60.00)
GLUCOSE: 107 mg/dL — AB (ref 70–99)
POTASSIUM: 4.7 meq/L (ref 3.5–5.1)
Phosphorus: 3.7 mg/dL (ref 2.3–4.6)
Sodium: 139 mEq/L (ref 135–145)

## 2016-07-01 NOTE — Progress Notes (Signed)
BP 126/88 (BP Location: Left Arm, Patient Position: Sitting, Cuff Size: Normal)   Pulse 97   Temp 98.1 F (36.7 C) (Oral)   Wt 107 lb 4 oz (48.6 kg)   SpO2 97%   BMI 19.62 kg/m    CC: f/u rehab visit Subjective:    Patient ID: Nicole English, female    DOB: [birth year], 83 y.o.   MRN: [id number]  HPI: Nicole English is a 83 y.o. female presenting on 07/01/2016 for Follow-up (from rehab)   Here with daughter in law Pam today. She stays with DIL and son, alone during the day. Lots of confusion over medicines. She doesn't think she's taking hydralazine. She was prior on lasix - but doesn't think she's currently taking. She doesn't think she's taking sertraline any more.   Recent hospitalization for right sided chest pressure pain associated with dyspnea and pleuritis. Blood pressure was elevated, ultrasound and V/Q scan were stable. She also had asymptomatic hyponatremia so lasix and SSRI were held with improvement. Physical therapy was consulted who recommended SNF. Chest pain attributed to uncontrolled hypertension. Also treated for COPD flare with prednisone course. This has since resolved.  HH came out to house, but patient did decline several evaluations.  Some concern over worsening cognition.   No f/u phone call performed. Admit date: 05/30/2016 Discharge date: 06/04/2016 Stayed in [facility] for 2 wks, came home 06/18/2016.  Admitted From: Home Disposition: SNF   Discharge Diagnoses:  Principal Problem:   Chest pain Active Problems:   Positive D dimer   CKD (chronic kidney disease), stage IV (HCC)   History of CVA (cerebrovascular accident)   Acute on chronic diastolic heart failure (HCC)   Acute hyperkalemia   Uncontrolled hypertension   Sinus bradycardia  Recommendations for Outpatient Follow-up:  1. Follow up with PCP following SNF discharge 2. Monitor BMP, particularly renal function, potassium and sodium.  3. Holding lasix until  improvement/resolution of hyponatremia (on fluid restriction and holding SSRI)  Home Health: N/A Equipment/Devices: None Discharge Condition: Stable CODE STATUS: Full Diet recommendation: Heart healthy, 1500cc fluid restriction  Relevant past medical, surgical, family and social history reviewed and updated as indicated. Interim medical history since our last visit reviewed. Allergies and medications reviewed and updated. Outpatient Medications Prior to Visit  Medication Sig Dispense Refill  . acetaminophen (TYLENOL) 500 MG tablet Take 1,000 mg by mouth 2 (two) times daily as needed (pain).     Marland Kitchen aspirin EC 81 MG tablet Take 81 mg by mouth daily.    . bisoprolol (ZEBETA) 5 MG tablet Take 1 tablet (5 mg total) by mouth daily. 30 tablet 11  . calcitRIOL (ROCALTROL) 0.25 MCG capsule Take 0.25 mcg by mouth 3 (three) times a week.     . cholecalciferol (VITAMIN D) 1000 UNITS tablet Take 1,000 Units by mouth daily. Reported on 05/20/2015    . gabapentin (NEURONTIN) 100 MG capsule TAKE ONE CAPSULE TWICE A DAY 60 capsule 3  . Oyster Shell (OYSTER CALCIUM) 500 MG TABS tablet Take 500 mg of elemental calcium by mouth daily. Reported on 05/20/2015    . Probiotic Product (VSL#3) CAPS Take 1 capsule by mouth daily. 30 capsule 0  . hydrALAZINE (APRESOLINE) 50 MG tablet Take 1 tablet (50 mg total) by mouth every 8 (eight) hours. (Patient not taking: Reported on 07/01/2016)    . sertraline (ZOLOFT) 50 MG tablet TAKE 1 TABLET BY MOUTH EVERY DAY (Patient not taking: Reported on 07/01/2016) 30 tablet 6  No facility-administered medications prior to visit.      Per HPI unless specifically indicated in ROS section below Review of Systems     Objective:    BP 126/88 (BP Location: Left Arm, Patient Position: Sitting, Cuff Size: Normal)   Pulse 97   Temp 98.1 F (36.7 C) (Oral)   Wt 107 lb 4 oz (48.6 kg)   SpO2 97%   BMI 19.62 kg/m   Wt Readings from Last 3 Encounters:  07/01/16 107 lb 4 oz (48.6 kg)    06/04/16 118 lb 9.6 oz (53.8 kg)  04/02/16 111 lb 4 oz (50.5 kg)    Physical Exam  Constitutional: She appears well-developed and well-nourished. No distress.  HENT:  Mouth/Throat: Oropharynx is clear and moist. No oropharyngeal exudate.  Eyes: Conjunctivae and EOM are normal. Pupils are equal, round, and reactive to light.  Cardiovascular: Normal rate, regular rhythm, normal heart sounds and intact distal pulses.   No murmur heard. Pulmonary/Chest: Effort normal and breath sounds normal. No respiratory distress. She has no wheezes. She has no rales.  Musculoskeletal: She exhibits no edema.  Skin: Skin is warm and dry. No rash noted.  Psychiatric: She has a normal mood and affect.  Nursing note and vitals reviewed.      Assessment & Plan:   Problem List Items Addressed This Visit    CKD (chronic kidney disease) stage 4, GFR 15-29 ml/min (HCC)    Update renal panel today.       Cognitive decline    Some concern for this with noted difficulty with managing medications. Will ask HHRN to evaluate, and further assess next visit at medicare wellness visit.       Diastolic dysfunction    She does not check weights regularly.  Prior diuretic regimen 20mg /30mg  lasix alternating daily.      HTN (hypertension) - Primary    BP well controlled today. Confusion over medication regimen at home. It seems she has not been taking hydralazine, and lasix was discontinued after recent hospitalization (prior dose 30mg /20mg  alternating days). She has been compliant with her bisoprolol 5mg .  I will recheck renal panel to guide lasix management decision.  I will ask home health nurse to come out to house for medication reconciliation. DIL requests we schedule with her or her husband. DIL asks about SW eval (this was mentioned by prior [facility]).  Son Remo Lipps [phone] DIL Pam [phone]      Hyponatremia    Update Na today - pt has been off lasix, off SSRI.      MDD (major depressive disorder)  (HCC)    Sertraline 50mg  stopped due to hyponatremia - will recheck today. No significant depressed mood endorsed at this time - will remain off.           Follow up plan: Return in about 3 months (around 10/01/2016) for medicare wellness visit.  Ria Bush, MD   Son Steven [place] DIL Pam [phone]

## 2016-07-01 NOTE — Addendum Note (Signed)
Addended by: Ellamae Sia on: 07/01/2016 03:42 PM   Modules accepted: Orders

## 2016-07-01 NOTE — Assessment & Plan Note (Signed)
Sertraline 50mg  stopped due to hyponatremia - will recheck today. No significant depressed mood endorsed at this time - will remain off.

## 2016-07-01 NOTE — Assessment & Plan Note (Signed)
BP well controlled today. Confusion over medication regimen at home. It seems she has not been taking hydralazine, and lasix was discontinued after recent hospitalization (prior dose 30mg /20mg  alternating days). She has been compliant with her bisoprolol 5mg .  I will recheck renal panel to guide lasix management decision.  I will ask home health nurse to come out to house for medication reconciliation. Nicole requests we schedule with her or her husband. Nicole asks about SW eval (this was mentioned by prior [facility]).  Nicole English Nicole English

## 2016-07-01 NOTE — Assessment & Plan Note (Addendum)
She does not check weights regularly.  Prior diuretic regimen 20mg /30mg  lasix alternating daily.

## 2016-07-01 NOTE — Assessment & Plan Note (Signed)
Some concern for this with noted difficulty with managing medications. Will ask HHRN to evaluate, and further assess next visit at medicare wellness visit.

## 2016-07-01 NOTE — Patient Instructions (Addendum)
Labs today. This will determine plan for lasix.  Let's stay off sertraline (zoloft) for now I will ask home health nurse to check medicines at home.  Return at your convenience over the next 2-3 months for medicare wellness visit.

## 2016-07-01 NOTE — Assessment & Plan Note (Signed)
Update renal panel today.

## 2016-07-01 NOTE — Assessment & Plan Note (Signed)
Update Na today - pt has been off lasix, off SSRI.

## 2016-07-05 ENCOUNTER — Telehealth: Payer: Self-pay

## 2016-07-05 NOTE — Telephone Encounter (Signed)
[employer] with [facility] left v/m; Estill Bamberg spoke with pts daughter in law; pts son and daughter in law are out of town and request assessment for home health be done on 07/08/16 when the son and his wife can be at appt also. Estill Bamberg called to let Dr Darnell Level know.

## 2016-07-05 NOTE — Telephone Encounter (Signed)
Noted. Thanks.

## 2016-07-08 ENCOUNTER — Telehealth: Payer: Self-pay | Admitting: Gastroenterology

## 2016-07-08 ENCOUNTER — Telehealth: Payer: Self-pay

## 2016-07-08 DIAGNOSIS — E875 Hyperkalemia: Secondary | ICD-10-CM | POA: Diagnosis not present

## 2016-07-08 DIAGNOSIS — R809 Proteinuria, unspecified: Secondary | ICD-10-CM | POA: Diagnosis not present

## 2016-07-08 DIAGNOSIS — E872 Acidosis: Secondary | ICD-10-CM | POA: Diagnosis not present

## 2016-07-08 DIAGNOSIS — M1990 Unspecified osteoarthritis, unspecified site: Secondary | ICD-10-CM | POA: Diagnosis not present

## 2016-07-08 DIAGNOSIS — I130 Hypertensive heart and chronic kidney disease with heart failure and stage 1 through stage 4 chronic kidney disease, or unspecified chronic kidney disease: Secondary | ICD-10-CM | POA: Diagnosis not present

## 2016-07-08 DIAGNOSIS — I5032 Chronic diastolic (congestive) heart failure: Secondary | ICD-10-CM | POA: Diagnosis not present

## 2016-07-08 DIAGNOSIS — K589 Irritable bowel syndrome without diarrhea: Secondary | ICD-10-CM | POA: Diagnosis not present

## 2016-07-08 DIAGNOSIS — Z7982 Long term (current) use of aspirin: Secondary | ICD-10-CM | POA: Diagnosis not present

## 2016-07-08 DIAGNOSIS — N390 Urinary tract infection, site not specified: Secondary | ICD-10-CM | POA: Diagnosis not present

## 2016-07-08 DIAGNOSIS — J449 Chronic obstructive pulmonary disease, unspecified: Secondary | ICD-10-CM | POA: Diagnosis not present

## 2016-07-08 DIAGNOSIS — F329 Major depressive disorder, single episode, unspecified: Secondary | ICD-10-CM | POA: Diagnosis not present

## 2016-07-08 DIAGNOSIS — Z8673 Personal history of transient ischemic attack (TIA), and cerebral infarction without residual deficits: Secondary | ICD-10-CM | POA: Diagnosis not present

## 2016-07-08 DIAGNOSIS — N184 Chronic kidney disease, stage 4 (severe): Secondary | ICD-10-CM | POA: Diagnosis not present

## 2016-07-08 DIAGNOSIS — I129 Hypertensive chronic kidney disease with stage 1 through stage 4 chronic kidney disease, or unspecified chronic kidney disease: Secondary | ICD-10-CM | POA: Diagnosis not present

## 2016-07-08 DIAGNOSIS — K219 Gastro-esophageal reflux disease without esophagitis: Secondary | ICD-10-CM | POA: Diagnosis not present

## 2016-07-08 DIAGNOSIS — M810 Age-related osteoporosis without current pathological fracture: Secondary | ICD-10-CM | POA: Diagnosis not present

## 2016-07-08 MED ORDER — MELATONIN 3 MG PO CAPS: 1.0000 | ORAL_CAPSULE | Freq: Every evening | ORAL | 0 refills | Status: DC | PRN

## 2016-07-08 NOTE — Telephone Encounter (Signed)
[place] with [facility] left v/m requesting med clarification questions for 1) should pt be taking zoloft 50 mg once a day? 2) should pt take melatonin 3 mg at hs prn; 3) Pt has bisoprolol and hydralazine ordered; at this time pt is only taking bisoprolol, should hydralazine be d/c. 4) when in rehab pt was given tylenol instead of tramadol; now that pt is back at home pt is taking tramadol. Should pt continue the tramadol or not. Amanda request cb.

## 2016-07-08 NOTE — Telephone Encounter (Signed)
Assuming blood pressure well controlled, recommend just continue bisoprolol 5mg  daily.  Ok to take melatonin 3mg  at night PRN sleep. Ok to d/c hydralazine.  Recommend she start with tylenol and minimize tramadol.  zoloft is no longer on med list - ok to hold for now, monitor mood.

## 2016-07-08 NOTE — Telephone Encounter (Signed)
Patient tells Estill Bamberg, her care provider, that her stomach hurts and burns. She was hospitalized in May for chest pain. She was not discharged on any GI medications. She has not mentioned this to her PCP. Encouraged to call PCP since he has seen her since her hospitalization. Appointment scheduled for here at first available.

## 2016-07-09 DIAGNOSIS — F329 Major depressive disorder, single episode, unspecified: Secondary | ICD-10-CM | POA: Diagnosis not present

## 2016-07-09 DIAGNOSIS — N184 Chronic kidney disease, stage 4 (severe): Secondary | ICD-10-CM | POA: Diagnosis not present

## 2016-07-09 DIAGNOSIS — I5032 Chronic diastolic (congestive) heart failure: Secondary | ICD-10-CM | POA: Diagnosis not present

## 2016-07-09 DIAGNOSIS — J449 Chronic obstructive pulmonary disease, unspecified: Secondary | ICD-10-CM | POA: Diagnosis not present

## 2016-07-09 DIAGNOSIS — K589 Irritable bowel syndrome without diarrhea: Secondary | ICD-10-CM | POA: Diagnosis not present

## 2016-07-09 DIAGNOSIS — I130 Hypertensive heart and chronic kidney disease with heart failure and stage 1 through stage 4 chronic kidney disease, or unspecified chronic kidney disease: Secondary | ICD-10-CM | POA: Diagnosis not present

## 2016-07-09 NOTE — Telephone Encounter (Signed)
Left detailed message.   

## 2016-07-09 NOTE — Telephone Encounter (Signed)
Nicole English from [place] called - he pt is having burning sensation in stomach.  I got her GI appt July 13, they recommended that I ask Dr Darnell Level if she can start an acid pump inhibitor.  (pepcid)  Or a prescription.  Leave detailed message  cb number is [phone] Nicole English)

## 2016-07-12 MED ORDER — RANITIDINE HCL 150 MG PO TABS: 150.0000 mg | ORAL_TABLET | Freq: Every day | ORAL | Status: DC

## 2016-07-12 NOTE — Telephone Encounter (Signed)
Recommend she start zantac (ranitidine) OTC 150mg  nightly.

## 2016-07-12 NOTE — Telephone Encounter (Signed)
Spoke to Nicole English and advised per Dr Danise [name]

## 2016-07-12 NOTE — Telephone Encounter (Signed)
Pt returned call please call [phone] Thanks

## 2016-07-12 NOTE — Telephone Encounter (Signed)
Attempted to contact pt; unable to leave vm 

## 2016-07-13 DIAGNOSIS — I130 Hypertensive heart and chronic kidney disease with heart failure and stage 1 through stage 4 chronic kidney disease, or unspecified chronic kidney disease: Secondary | ICD-10-CM | POA: Diagnosis not present

## 2016-07-13 DIAGNOSIS — J449 Chronic obstructive pulmonary disease, unspecified: Secondary | ICD-10-CM | POA: Diagnosis not present

## 2016-07-13 DIAGNOSIS — F329 Major depressive disorder, single episode, unspecified: Secondary | ICD-10-CM | POA: Diagnosis not present

## 2016-07-13 DIAGNOSIS — K589 Irritable bowel syndrome without diarrhea: Secondary | ICD-10-CM | POA: Diagnosis not present

## 2016-07-13 DIAGNOSIS — I5032 Chronic diastolic (congestive) heart failure: Secondary | ICD-10-CM | POA: Diagnosis not present

## 2016-07-13 DIAGNOSIS — N184 Chronic kidney disease, stage 4 (severe): Secondary | ICD-10-CM | POA: Diagnosis not present

## 2016-07-13 NOTE — Telephone Encounter (Signed)
Megan nurse with [facility] said pt is having back pain with pain level 6 out of 10. Tylenol is not effective. Was not sure what meant by minimize Tylenol in regards to how many she can take and how often.. Pt has 4 tramadol in her home. [place] request cb. [facility].

## 2016-07-13 NOTE — Addendum Note (Signed)
Addended by: Helene Shoe on: 07/13/2016 04:26 PM   Modules accepted: Orders

## 2016-07-14 MED ORDER — TRAMADOL HCL 50 MG PO TABS: 50.0000 mg | ORAL_TABLET | Freq: Every day | ORAL | 0 refills | Status: DC | PRN

## 2016-07-14 NOTE — Telephone Encounter (Signed)
plz phone in refill and notify Megan - start with tylenol for pain, if persistent pain then may take tramadol.

## 2016-07-14 NOTE — Addendum Note (Signed)
Addended by: Ria Bush on: 07/14/2016 08:48 AM   Modules accepted: Orders

## 2016-07-14 NOTE — Telephone Encounter (Signed)
Rx called in to requested pharmacy. Lm on amandas vm and advised. No number avail for [facility]

## 2016-07-15 DIAGNOSIS — J449 Chronic obstructive pulmonary disease, unspecified: Secondary | ICD-10-CM | POA: Diagnosis not present

## 2016-07-15 DIAGNOSIS — F329 Major depressive disorder, single episode, unspecified: Secondary | ICD-10-CM | POA: Diagnosis not present

## 2016-07-15 DIAGNOSIS — I130 Hypertensive heart and chronic kidney disease with heart failure and stage 1 through stage 4 chronic kidney disease, or unspecified chronic kidney disease: Secondary | ICD-10-CM | POA: Diagnosis not present

## 2016-07-15 DIAGNOSIS — I5032 Chronic diastolic (congestive) heart failure: Secondary | ICD-10-CM | POA: Diagnosis not present

## 2016-07-15 DIAGNOSIS — N184 Chronic kidney disease, stage 4 (severe): Secondary | ICD-10-CM | POA: Diagnosis not present

## 2016-07-15 DIAGNOSIS — K589 Irritable bowel syndrome without diarrhea: Secondary | ICD-10-CM | POA: Diagnosis not present

## 2016-07-22 DIAGNOSIS — K589 Irritable bowel syndrome without diarrhea: Secondary | ICD-10-CM | POA: Diagnosis not present

## 2016-07-22 DIAGNOSIS — I130 Hypertensive heart and chronic kidney disease with heart failure and stage 1 through stage 4 chronic kidney disease, or unspecified chronic kidney disease: Secondary | ICD-10-CM | POA: Diagnosis not present

## 2016-07-22 DIAGNOSIS — N184 Chronic kidney disease, stage 4 (severe): Secondary | ICD-10-CM | POA: Diagnosis not present

## 2016-07-22 DIAGNOSIS — J449 Chronic obstructive pulmonary disease, unspecified: Secondary | ICD-10-CM | POA: Diagnosis not present

## 2016-07-22 DIAGNOSIS — I5032 Chronic diastolic (congestive) heart failure: Secondary | ICD-10-CM | POA: Diagnosis not present

## 2016-07-22 DIAGNOSIS — F329 Major depressive disorder, single episode, unspecified: Secondary | ICD-10-CM | POA: Diagnosis not present

## 2016-07-23 ENCOUNTER — Telehealth: Payer: Self-pay | Admitting: Family Medicine

## 2016-07-23 NOTE — Telephone Encounter (Signed)
Meagan with [facility] called to let you know pt refused a nurse visit today. Meagan states she became agitated yesterday at her visit when they were reviewing her meds and refused a visit today. She believes she is having memory issues.

## 2016-07-24 ENCOUNTER — Other Ambulatory Visit: Payer: Self-pay | Admitting: Family Medicine

## 2016-07-26 ENCOUNTER — Telehealth: Payer: Self-pay

## 2016-07-26 MED ORDER — TRAMADOL HCL 50 MG PO TABS: 50.0000 mg | ORAL_TABLET | Freq: Every day | ORAL | 0 refills | Status: DC | PRN

## 2016-07-26 NOTE — Telephone Encounter (Signed)
Controlled substance tramadol was phoned in 07/14/2016 without DOB or DEA#. We didn't correct mistake until today. Pt was upset she was out of medication for that long.  plz remind clinical staff to phone in complete Rx with DEA# for controlled substances.

## 2016-07-26 NOTE — Addendum Note (Signed)
Addended by: Helene Shoe on: 07/26/2016 08:43 AM   Modules accepted: Orders

## 2016-07-26 NOTE — Telephone Encounter (Signed)
I spoke with [facility]itor pharmacist at [facility]. Shiny said on 06-27/18 tramadol was called in with no DOB or DEA #. Medication phoned to [facility] at [place] as instructed. Apologized to pt and advised tramadol would be ready for pick up today at 10 AM. Pt voiced understanding. FYI to Dr Darnell Level.

## 2016-07-26 NOTE — Telephone Encounter (Signed)
I spoke with Shiny pharmacist at [place]. And was advised when refill was left on v/m no DOB or DEA # was left and rx has not been filled.Medication phoned to [place] as instructed. Pt notified and pt voiced understanding per Shiny can pick up rx this morning at 10 AM.

## 2016-07-26 NOTE — Telephone Encounter (Signed)
Agree with this. thanks. 

## 2016-07-26 NOTE — Telephone Encounter (Signed)
PLEASE NOTE: All timestamps contained within this report are represented as [place] Standard Time. CONFIDENTIALTY NOTICE: This fax transmission is intended only for the addressee. It contains information that is legally privileged, confidential or otherwise protected from use or disclosure. If you are not the intended recipient, you are strictly prohibited from reviewing, disclosing, copying using or disseminating any of this information or taking any action in reliance on or regarding this information. If you have received this fax in error, please notify us immediately by telephone so that we can arrange for its return to [place]. Phone: [phone], Toll-Free: [phone], Fax: [phone] Page: 1 of 2 Call Id: [id number] [place] Patient Name: Nicole English Gender: Female DOB: 1932/07/18 Age: 83 Y 77 M 5 D Return Phone Number: [phone] (Primary), [phone] (Secondary) Address: [address] City/State/Zip: [place] [address] Client [facility] - Client Client Site [place] Physician Ria Bush - MD Who Is Calling Patient / Member / Family / Caregiver Call Type Triage / Clinical Relationship To Patient Self Return Phone Number [phone] (Secondary) Chief Complaint Back Pain - General Reason for Call Symptomatic / Request for Health Information Initial Comment [name] needs a refill on her medication. She has 1 tablet left. Medication is tramadol 50mg . [name] has a bladder infection and low back pain. Appointment Disposition EMR Patient Refused Appointment Info pasted into Epic No Nurse Assessment Nurse: Markus Daft, RN, Sherre Poot Date/Time Eilene Ghazi Time): 07/24/2016 12:07:49 PM Confirm and document reason for call. If symptomatic, describe symptoms. ---[name] states that she takes Tramadol for bladder infection burning  pain - she never took antibiotic for the last week, and lower back pain which also started in last week. Does the PT have any chronic conditions? (i.e. diabetes, asthma, etc.) ---No Nurse: Markus Daft, RN, Windy Date/Time (Eastern Time): 07/24/2016 12:08:15 PM Please select the assessment type ---Request for controlled medication refill Additional Documentation ---[name] states that she needs a refill on her Tramadol 50 mg, she has 1/2 tab left. She takes it for bladder infection burning pain - she never took antibiotic for the last week, and lower back pain which also started in last week. Is there an on-call physician for the client? ---Yes Do the client directives specifically allow for paging the on-call regarding scheduled drugs? ---No Additional Documentation ---RN advised that it is controlled substance and can not be called in after hrs. [name] verbalized understanding. Guidelines Guideline Title Affirmed Question Urination Pain - Female Side (flank) or lower back pain present Disp. Time Eilene Ghazi Time) Disposition Final User 07/24/2016 12:11:49 PM See Physician within 4 Hours (or PCP triage) Yes Markus Daft, RN, Sherre Poot PLEASE NOTE: All timestamps contained within this report are represented as [place] Standard Time. CONFIDENTIALTY NOTICE: This fax transmission is intended only for the addressee. It contains information that is legally privileged, confidential or otherwise protected from use or disclosure. If you are not the intended recipient, you are strictly prohibited from reviewing, disclosing, copying using or disseminating any of this information or taking any action in reliance on or regarding this information. If you have received this fax in error, please notify us immediately by telephone so that we can arrange for its return to [place]. Phone: [phone], Toll-Free: [phone], Fax: [phone] Page: 2 of 2 Call Id: [id number] Referrals [facility] [place] Advice Given Per Guideline SEE PHYSICIAN WITHIN 4 HOURS (or PCP  triage): * IF OFFICE WILL BE CLOSED AND NO PCP TRIAGE: You need to be seen within the next 3 or 4 hours. A nearby Urgent Care Center is often a good source of care. Another choice is to go to the ER. Go sooner if you become worse. PAIN MEDICINES: * For pain relief, take acetaminophen, ibuprofen, or naproxen. * Before taking any medicine, read all the instructions on the package. CALL BACK IF: * You become worse. CARE ADVICE given per Urination Pain - Female (Adult) guideline.

## 2016-07-26 NOTE — Telephone Encounter (Signed)
Estill Bamberg with [facility] left v/m requesting verbal orders for [facility] nursing 1 x a week this week for discharge from nursing services as long as pt has understanding of meds and meds are being tolerated and family is helping.Amanda request cb.

## 2016-07-27 NOTE — Telephone Encounter (Signed)
Verbal order given to [facility] by telephone as instructed.

## 2016-07-28 NOTE — Telephone Encounter (Signed)
I have sent out a message addressing this with the clinical staff.    Thanks.

## 2016-07-30 ENCOUNTER — Ambulatory Visit (INDEPENDENT_AMBULATORY_CARE_PROVIDER_SITE_OTHER): Payer: Medicare Other | Admitting: Gastroenterology

## 2016-07-30 ENCOUNTER — Encounter: Payer: Self-pay | Admitting: Gastroenterology

## 2016-07-30 VITALS — BP 140/62 | HR 57 | Ht 61.0 in | Wt 112.2 lb

## 2016-07-30 DIAGNOSIS — K589 Irritable bowel syndrome without diarrhea: Secondary | ICD-10-CM

## 2016-07-30 DIAGNOSIS — R1030 Lower abdominal pain, unspecified: Secondary | ICD-10-CM

## 2016-07-30 NOTE — Patient Instructions (Signed)
Follow up as needed

## 2016-07-30 NOTE — Progress Notes (Signed)
Nicole English    [id number]    [birth year]  Primary Care Physician:Gutierrez, Garlon Hatchet, MD  Referring Physician: Ria Bush, MD [place], [place] [address]  Chief complaint:  Abdominal pain  HPI: 6 yr F here for follow up visit. She was last seen in office in Januart 2018. She was recently hospitalized with chest pain.  Patient had lower abdominal pain with burning sensation , bloating and painful urination and resolved by itself and she currently has no symptoms.   She has history of irritable bowel syndrome with alternating constipation and diarrhea, fecal and urinary incontinence.  CT abdomen and pelvis July 2017 showed cholelithiasis without evidence of cholecystitis, left-sided diverticulosis otherwise no acute abnormality  Ultrasound abdomen January 2018 showed multiple gallstones, normal gallbladder wall and CBD. No other acute abnormality.  She was recently hospitalized in May 2018 with chest pain, was hyponatremic. Brain MRI was negative for any acute pathology. Chest pain was thought secondary to uncontrolled hypertension. Echocardiogram showed no acute abnormality. Positive d-dimer with negative VQ scan. She was discharged to SNF and subsequently home with her health RN. She feels home health RN is nuisance. She lives upstairs and she has to come down to let them in. Her son and his family lives downstairs.  Denies any nausea, vomiting, abdominal pain, melena or bright red blood per rectum    Outpatient Encounter Prescriptions as of 07/30/2016  Medication Sig  . acetaminophen (TYLENOL) 500 MG tablet Take 1,000 mg by mouth 2 (two) times daily as needed (pain).   Marland Kitchen aspirin EC 81 MG tablet Take 81 mg by mouth daily.  . bisoprolol (ZEBETA) 5 MG tablet Take 1 tablet (5 mg total) by mouth daily.  . calcitRIOL (ROCALTROL) 0.25 MCG capsule Take 0.25 mcg by mouth 3 (three) times a week.   . cholecalciferol (VITAMIN D) 1000 UNITS tablet Take  1,000 Units by mouth daily. Reported on 05/20/2015  . gabapentin (NEURONTIN) 100 MG capsule TAKE ONE CAPSULE TWICE A DAY  . Melatonin 3 MG CAPS Take 1 capsule (3 mg total) by mouth at bedtime as needed.  [place] (OYSTER CALCIUM) 500 MG TABS tablet Take 500 mg of elemental calcium by mouth daily. Reported on 05/20/2015  . Probiotic Product (VSL#3) CAPS Take 1 capsule by mouth daily.  . ranitidine (ZANTAC) 150 MG tablet Take 1 tablet (150 mg total) by mouth at bedtime.  . traMADol (ULTRAM) 50 MG tablet Take 1 tablet (50 mg total) by mouth daily as needed.   No facility-administered encounter medications on file as of 07/30/2016.     Allergies as of 07/30/2016 - Review Complete 07/30/2016  Allergen Reaction Noted  . Amlodipine Other (See Comments) 03/26/2013  . Doxycycline monohydrate Nausea And Vomiting   . Ramipril Other (See Comments) 05/19/2007    Past Medical History:  Diagnosis Date  . Abdominal aortic atherosclerosis ([place]) 07/2015   by CT  . Abnormal drug screen 12/2013   abnormally neg xanax (12/2013) and neg xanax and positive EtOH (04/2014), appropriate (08/2014)  . Allergic rhinitis   . Anxiety   . CKD (chronic kidney disease) stage 4, GFR 15-29 ml/min ([facility]) 12/2012   per pt after brown recluse bite. sees Dr Juleen China, off ACEI, bp ok slightly elevated to maintain renal perfusion  . 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 DDD (degenerative disc disease),  lumbar   . Depression   . Diastolic dysfunction 6812   per echo in 11/2010 and again 03/2012; normal EF  . Diverticulosis    colonsocopy 2002 aborted 2/2 tortuous colon and heavy sigmoid diverticulosis  . Dysphagia    EGD 2012 - wnl, s/p esoph dilation  . GERD (gastroesophageal reflux disease)   . Glucose intolerance (impaired glucose tolerance)   . History of CVA (cerebrovascular accident) 2012   old per prior head CT  . History of  diverticulitis of colon   . HLD (hyperlipidemia)   . HTN (hypertension)    Dr. [place], cards  . IBS (irritable bowel syndrome)   . Migraine   . Osteoporosis 08/2013   Spine -2.2, hip -3.5  . Rib fractures 10/11/2014  . Right upper lobe pneumonia ([place]) 03/25/2014  . SUI (stress urinary incontinence, female)    Dr. Amalia Hailey, urology (some urge as well)    Past Surgical History:  Procedure Laterality Date  . BREAST ENHANCEMENT SURGERY  1982  . COLONOSCOPY  2002   does not want another! severe diverticulosis with luminal narrowing and spasm Deatra Ina)  . CT HEAD LIMITED W/O CM  04/2010   nothing acute, ? old infarcts L internal capsule  . DEXA  08/2013   Spine -2.2, hip -3.5  . ESOPHAGOGASTRODUODENOSCOPY  2012   WNL, s/p esoph dilation (Dr. Candace Cruise, [facility])  . macroplastique implantation  03/2008   Dr. Rogers Blocker  . renal doppler  2009?   simple cysts, wnl  . submucous resection  1962  . TONSILLECTOMY  1942  . TOTAL ABDOMINAL HYSTERECTOMY  1996   for frequent dysmennorhea  . TUBAL LIGATION  1973  . uretherolysis and removal of macroplastique  09/13/08   Dr. Amalia Hailey  . US ECHOCARDIOGRAPHY  11/2010   EF 55-60%, mild LAE, mild diastolic dysfunction, normal valves    Family History  Problem Relation Age of Onset  . Heart failure Father   . Hypertension Father   . Coronary artery disease Father   . Prostate cancer Father   . Leukemia Mother        CLL prior  . Breast cancer Maternal Aunt   . Breast cancer Paternal Aunt   . Colon cancer Neg Hx   . Esophageal cancer Neg Hx   . Stomach cancer Neg Hx   . Pancreatic cancer Neg Hx   . Liver disease Neg Hx     Social History   Social History  . Marital status: Widowed    Spouse name: N/A  . Number of children: 3  . Years of education: N/A   Occupational History  . Former Personal assistant    Social History Main Topics  . Smoking status: Former Smoker    Packs/day: 0.50    Years: 54.00    Types: Cigarettes    Start date: 01/18/1954     Quit date: 01/19/2008  . Smokeless tobacco: Never Used  . Alcohol use 0.0 oz/week     Comment: 1 1/2-2 glasses white wine/day  . Drug use: No  . Sexual activity: Not Currently   Other Topics Concern  . Not on file   Social History Narrative  . No narrative on file      Review of systems: Review of Systems  Constitutional: Negative for fever and chills.  HENT: Negative.   Eyes: Negative for blurred vision.  Respiratory: Negative for cough, shortness of breath and wheezing.   Cardiovascular: Negative for chest pain and palpitations.  Gastrointestinal: as per  HPI Genitourinary: Negative for dysuria, urgency, frequency and hematuria.  Musculoskeletal: Negative for myalgias, back pain and joint pain.  Skin: Negative for itching and rash.  Neurological: Negative for dizziness, tremors, focal weakness, seizures and loss of consciousness.  Endo/Heme/Allergies: Positive for seasonal allergies.  Psychiatric/Behavioral: Negative for depression, suicidal ideas and hallucinations.  All other systems reviewed and are negative.   Physical Exam: Vitals:   07/30/16 0914  BP: 140/62  Pulse: (!) 57   Body mass index is 21.2 kg/m. Gen:      No acute distress HEENT:  EOMI, sclera anicteric Neck:     No masses; no thyromegaly Lungs:    Clear to auscultation bilaterally; normal respiratory effort CV:         Regular rate and rhythm; no murmurs Abd:      + bowel sounds; soft, non-tender; no palpable masses, no distension Ext:    No edema; adequate peripheral perfusion Skin:      Warm and dry; no rash Neuro: alert and oriented x 3 Psych: normal mood and affect  Data Reviewed:  Reviewed labs, radiology imaging, old records and pertinent past GI work up   Assessment and Plan/Recommendations:  83 year old female with history of hypertension, CKD, history of CVA, irritable bowel syndrome, urinary incontinence, fecal incontinence here for follow-up visit after recent hospitalization in May  2018 Patient requested appointment with complaints of abdominal pain which has since resolved Differential includes pain associated with diverticular disease versus irritable bowel syndrome versus functional Advise patient to call with any change in symptoms Follow-up as needed  15 minutes was spent face-to-face with the patient. Greater than 50% of the time used for counseling as well as treatment plan and follow-up. She had multiple questions which were answered to her satisfaction  K. Denzil Magnuson , MD [phone] Mon-Fri 8a-5p [phone] after 5p, weekends, holidays  CC: Ria Bush, MD

## 2016-08-02 ENCOUNTER — Other Ambulatory Visit: Payer: Self-pay | Admitting: Gastroenterology

## 2016-08-11 ENCOUNTER — Other Ambulatory Visit: Payer: Self-pay | Admitting: Family Medicine

## 2016-08-11 NOTE — Telephone Encounter (Signed)
Last refill 07/26/16  Last OV 07/01/16 Ok to refill?

## 2016-08-11 NOTE — Telephone Encounter (Signed)
plz phone in. 

## 2016-08-12 ENCOUNTER — Other Ambulatory Visit: Payer: Self-pay | Admitting: Family Medicine

## 2016-08-12 NOTE — Telephone Encounter (Signed)
Rx called in to requested pharmacy 

## 2016-08-15 ENCOUNTER — Other Ambulatory Visit: Payer: Self-pay | Admitting: Family Medicine

## 2016-08-18 DIAGNOSIS — N2581 Secondary hyperparathyroidism of renal origin: Secondary | ICD-10-CM | POA: Diagnosis not present

## 2016-08-18 DIAGNOSIS — I129 Hypertensive chronic kidney disease with stage 1 through stage 4 chronic kidney disease, or unspecified chronic kidney disease: Secondary | ICD-10-CM | POA: Diagnosis not present

## 2016-08-18 DIAGNOSIS — N184 Chronic kidney disease, stage 4 (severe): Secondary | ICD-10-CM | POA: Diagnosis not present

## 2016-08-18 DIAGNOSIS — D631 Anemia in chronic kidney disease: Secondary | ICD-10-CM | POA: Diagnosis not present

## 2016-08-18 DIAGNOSIS — R809 Proteinuria, unspecified: Secondary | ICD-10-CM | POA: Diagnosis not present

## 2016-08-19 ENCOUNTER — Telehealth: Payer: Self-pay | Admitting: *Deleted

## 2016-08-19 DIAGNOSIS — D631 Anemia in chronic kidney disease: Secondary | ICD-10-CM | POA: Diagnosis not present

## 2016-08-19 DIAGNOSIS — R809 Proteinuria, unspecified: Secondary | ICD-10-CM | POA: Diagnosis not present

## 2016-08-19 DIAGNOSIS — N184 Chronic kidney disease, stage 4 (severe): Secondary | ICD-10-CM | POA: Diagnosis not present

## 2016-08-19 DIAGNOSIS — N2581 Secondary hyperparathyroidism of renal origin: Secondary | ICD-10-CM | POA: Diagnosis not present

## 2016-08-19 DIAGNOSIS — N390 Urinary tract infection, site not specified: Secondary | ICD-10-CM | POA: Diagnosis not present

## 2016-08-19 DIAGNOSIS — I129 Hypertensive chronic kidney disease with stage 1 through stage 4 chronic kidney disease, or unspecified chronic kidney disease: Secondary | ICD-10-CM | POA: Diagnosis not present

## 2016-08-19 MED ORDER — TRAMADOL HCL 50 MG PO TABS: 50.0000 mg | ORAL_TABLET | Freq: Two times a day (BID) | ORAL | 0 refills | Status: DC | PRN

## 2016-08-19 NOTE — Telephone Encounter (Signed)
plz phone in new sig and #50. However, I do not want her taking twice daily every day. Needs to only take for moderate to severe pain. This medicine should only be taken as needed, not scheduled twice every day, as overuse can cause negative side effects.

## 2016-08-19 NOTE — Telephone Encounter (Signed)
Pt left message at triage regarding tramadol Rx. She states she has been taking BID, but Rx is written for 1 tab daily prn. Pt is requesting a new Rx with new instruction to prevent her from running out of meds. pls advise

## 2016-08-20 ENCOUNTER — Encounter (HOSPITAL_COMMUNITY): Payer: Self-pay | Admitting: Nurse Practitioner

## 2016-08-20 ENCOUNTER — Emergency Department (HOSPITAL_COMMUNITY): Payer: Medicare Other

## 2016-08-20 ENCOUNTER — Telehealth: Payer: Self-pay | Admitting: Family Medicine

## 2016-08-20 ENCOUNTER — Emergency Department (HOSPITAL_COMMUNITY)
Admission: EM | Admit: 2016-08-20 | Discharge: 2016-08-20 | Disposition: A | Discharge status: Home / Self Care | Payer: Medicare Other | Attending: Emergency Medicine | Admitting: Emergency Medicine

## 2016-08-20 DIAGNOSIS — J449 Chronic obstructive pulmonary disease, unspecified: Secondary | ICD-10-CM | POA: Insufficient documentation

## 2016-08-20 DIAGNOSIS — K5730 Diverticulosis of large intestine without perforation or abscess without bleeding: Secondary | ICD-10-CM | POA: Diagnosis not present

## 2016-08-20 DIAGNOSIS — K623 Rectal prolapse: Secondary | ICD-10-CM | POA: Diagnosis not present

## 2016-08-20 DIAGNOSIS — I509 Heart failure, unspecified: Secondary | ICD-10-CM | POA: Diagnosis not present

## 2016-08-20 DIAGNOSIS — R911 Solitary pulmonary nodule: Secondary | ICD-10-CM | POA: Diagnosis not present

## 2016-08-20 DIAGNOSIS — K6289 Other specified diseases of anus and rectum: Secondary | ICD-10-CM | POA: Diagnosis present

## 2016-08-20 DIAGNOSIS — K8020 Calculus of gallbladder without cholecystitis without obstruction: Secondary | ICD-10-CM | POA: Diagnosis not present

## 2016-08-20 DIAGNOSIS — Z87891 Personal history of nicotine dependence: Secondary | ICD-10-CM | POA: Diagnosis not present

## 2016-08-20 DIAGNOSIS — N184 Chronic kidney disease, stage 4 (severe): Secondary | ICD-10-CM | POA: Insufficient documentation

## 2016-08-20 DIAGNOSIS — I130 Hypertensive heart and chronic kidney disease with heart failure and stage 1 through stage 4 chronic kidney disease, or unspecified chronic kidney disease: Secondary | ICD-10-CM | POA: Diagnosis not present

## 2016-08-20 DIAGNOSIS — Z79899 Other long term (current) drug therapy: Secondary | ICD-10-CM | POA: Diagnosis not present

## 2016-08-20 DIAGNOSIS — D649 Anemia, unspecified: Secondary | ICD-10-CM | POA: Insufficient documentation

## 2016-08-20 LAB — COMPREHENSIVE METABOLIC PANEL
ALT: 8 U/L — ABNORMAL LOW (ref 14–54)
ANION GAP: 12 (ref 5–15)
AST: 16 U/L (ref 15–41)
Albumin: 3.9 g/dL (ref 3.5–5.0)
Alkaline Phosphatase: 77 U/L (ref 38–126)
BILIRUBIN TOTAL: 0.6 mg/dL (ref 0.3–1.2)
BUN: 46 mg/dL — ABNORMAL HIGH (ref 6–20)
CO2: 21 mmol/L — ABNORMAL LOW (ref 22–32)
Calcium: 9.5 mg/dL (ref 8.9–10.3)
Chloride: 106 mmol/L (ref 101–111)
Creatinine, Ser: 3.21 mg/dL — ABNORMAL HIGH (ref 0.44–1.00)
GFR, EST AFRICAN AMERICAN: 14 mL/min — AB (ref >60)
GFR, EST NON AFRICAN AMERICAN: 12 mL/min — AB (ref >60)
Glucose, Bld: 116 mg/dL — ABNORMAL HIGH (ref 65–99)
POTASSIUM: 5.2 mmol/L — AB (ref 3.5–5.1)
Sodium: 139 mmol/L (ref 135–145)
TOTAL PROTEIN: 6.6 g/dL (ref 6.5–8.1)

## 2016-08-20 LAB — URINALYSIS, ROUTINE W REFLEX MICROSCOPIC
BILIRUBIN URINE: NEGATIVE
GLUCOSE, UA: NEGATIVE mg/dL
HGB URINE DIPSTICK: NEGATIVE
Ketones, ur: NEGATIVE mg/dL
NITRITE: NEGATIVE
SPECIFIC GRAVITY, URINE: 1.015 (ref 1.005–1.030)
pH: 6 (ref 5.0–8.0)

## 2016-08-20 LAB — CBC
HEMATOCRIT: 37.2 % (ref 36.0–46.0)
HEMOGLOBIN: 11.7 g/dL — AB (ref 12.0–15.0)
MCH: 30.7 pg (ref 26.0–34.0)
MCHC: 31.5 g/dL (ref 30.0–36.0)
MCV: 97.6 fL (ref 78.0–100.0)
Platelets: 315 10*3/uL (ref 150–400)
RBC: 3.81 MIL/uL — ABNORMAL LOW (ref 3.87–5.11)
RDW: 13.7 % (ref 11.5–15.5)
WBC: 8.7 10*3/uL (ref 4.0–10.5)

## 2016-08-20 LAB — LIPASE, BLOOD: LIPASE: 55 U/L — AB (ref 11–51)

## 2016-08-20 MED ORDER — SODIUM CHLORIDE 0.9 % IV BOLUS (SEPSIS)
1000.0000 mL | Freq: Once | INTRAVENOUS | Status: AC
  Administered 2016-08-20: 1000 mL via INTRAVENOUS

## 2016-08-20 NOTE — ED Notes (Signed)
IV removed, ready for discharge

## 2016-08-20 NOTE — Telephone Encounter (Addendum)
I spoke with pt and for one wk she has had rectal pain; the 2-3 ": rectal protrusion started last night with rectal bleeding. Pt says she needs referral to Dr Silverio Decamp. I spoke with [facility] at Dr Woodward Ku office and she said pt does not need a referral and pt was just seen 07/30/16. Beth advised for pt to go to ED for eval and tell pt that Dr Silverio Decamp is hospital doctor and if ED doctor needs to can call Dr Silverio Decamp. Pt said she was not seen on 07/30/16, no one took vitals or anything; Dr Silverio Decamp stuck head in door and told pt she would need referral before she could be seen. After putting pt on hold and when I went back to talk with pt she was having a conversation with a gentleman; they were talking back and forth. Pt said she was not sure how she would get to ED. I offered to call ambulance and pt said NO. I asked if the person she was talking to when I came back on phone could take her; she said no one was there it was the TV. I offered to call pts son and she said no she could call her son. Pt did say she would go to [facility] at end of call.FYI to Dr Darnell Level. I spoke with Dr Darnell Level and he advised to contact the son; I spoke with Michiel Cowboy (DPR signed) and made him aware of above info. Annie Main said pt did call him and he is at her home;Stephen said there was someone else at the house when I talked with her earlier. Annie Main says every 3 months pt wants to go to the ED on a Fri. Annie Main said the in home nurses had come out to assist pt with meds and help pt and pt was very rude to the nurses that she did not need their help; Annie Main said he is taking pt to ED now. FYI to Dr Darnell Level.

## 2016-08-20 NOTE — Discharge Instructions (Signed)
We are not completely sure what you felt today, but you may have had a prolapse of your rectum by description. Your CT abdomen pelvis does not show any acute findings in the abdomen/pelvis.  You do have incidental finding of 4 mm lung nodule of the left lung. This does not mean anything bad, but you will need to let your primary care doctor know about this to decide if you need follow-up CT scan in a year to monitor.   Please return without fail for worsening symptoms, including severe abdominal pain, recurrent rectal prolapse, or any other symptoms concerning to you.

## 2016-08-20 NOTE — ED Triage Notes (Addendum)
Pt presents with c/o rectal pain. When she stood from bed this morning she felt a bulging mass from her rectum that was extremely painful. She describes the mass as hard. she pushed the mass back into her rectum and it has remained since, but she continues to have rectal discomfort. She reports some light bleeding throughout the day as well. Shes had some intermittent abdominal pain and cramping over the past few days. Her blood pressure is elevated in triage. She reports feeling anxious.

## 2016-08-20 NOTE — Telephone Encounter (Signed)
Patient Name: Nicole English  DOB: Oct 27, 1932    Initial Comment [name] states she is wanting to get a referral- said someone from the office transferred her here. Has a rectal protrusion 2-3 inches    Nurse Assessment  Nurse: Verlin Fester, RN, [facility] Date/Time Eilene Ghazi Time): 08/20/2016 2:36:45 PM  Confirm and document reason for call. If symptomatic, describe symptoms. ---Patient states she wants to get a referral for a rectal prolapse it is out 2-3 inches. She has been bleeding from this area for a couple of months  Does the patient have any new or worsening symptoms? ---Yes  Will a triage be completed? ---Yes  Related visit to physician within the last 2 weeks? ---No  Does the PT have any chronic conditions? (i.e. diabetes, asthma, etc.) ---No  Is this a behavioral health or substance abuse call? ---No     Guidelines    Guideline Title Affirmed Question Affirmed Notes  Rectal Bleeding [1] MODERATE rectal bleeding (small blood clots, passing blood without stool, or toilet water turns red) AND [2] more than once a day    Final Disposition User   Go to ED Now Verlin Fester, RN, [facility] - ED   Disagree/Comply: Comply   PAtient refused ED because she doesn't drive, offered 282 and she refused states she just wants to get a referral

## 2016-08-20 NOTE — ED Provider Notes (Signed)
[facility] DEPT Provider Note   CSN: [id number] Arrival date & time: 08/20/16  1613     History   Chief Complaint No chief complaint on file.   HPI Nicole English is a 83 y.o. female.   Illness  This is a new problem. The current episode started 1 to 2 hours ago. The problem occurs rarely. The problem has been resolved. Pertinent negatives include no abdominal pain and no shortness of breath. Nothing aggravates the symptoms. Relieved by: self reduction.   83 year old female who presents with possible rectal prolapse. She has a history of CVA, IBS, chronic kidney disease, hypertension hyperlipidemia. Reports that she feels that her bowels have been abnormal over the past 2-3 weeks, but she is unable to specify any further. Sees about a 1 week ago she did have abdominal swelling with abdominal pain. She is unsure if she has been having constipation recently, but also unsure if she could also be having diarrhea. She states that she has not been paying attention to her stools. Today while coming out of the shower felt something protruding from her rectum. She pushed it back in. As noted light blood when she wiped. States that she did not have any severe straining or severe constipation today. Currently denies any abdominal pain, nausea or vomiting, fevers or chills, or urinary complaints. No significant weight changes. Past Medical History:  Diagnosis Date  . Abdominal aortic atherosclerosis ([place]) 07/2015   by CT  . Abnormal drug screen 12/2013   abnormally neg xanax (12/2013) and neg xanax and positive EtOH (04/2014), appropriate (08/2014)  . Allergic rhinitis   . Anxiety   . CKD (chronic kidney disease) stage 4, GFR 15-29 ml/min (HCC) 12/2012   per pt after brown recluse bite. sees Dr Juleen China, off ACEI, bp ok slightly elevated to maintain renal perfusion  . 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 DDD  (degenerative disc disease), lumbar   . Depression   . Diastolic dysfunction 2725   per echo in 11/2010 and again 03/2012; normal EF  . Diverticulosis    colonsocopy 2002 aborted 2/2 tortuous colon and heavy sigmoid diverticulosis  . Dysphagia    EGD 2012 - wnl, s/p esoph dilation  . GERD (gastroesophageal reflux disease)   . Glucose intolerance (impaired glucose tolerance)   . History of CVA (cerebrovascular accident) 2012   old per prior head CT  . History of diverticulitis of colon   . HLD (hyperlipidemia)   . HTN (hypertension)    Dr. Martinique, cards  . IBS (irritable bowel syndrome)   . Migraine   . Osteoporosis 08/2013   Spine -2.2, hip -3.5  . Rib fractures 10/11/2014  . Right upper lobe pneumonia ([place]) 03/25/2014  . SUI (stress urinary incontinence, female)    Dr. Amalia Hailey, urology (some urge as well)    Patient Active Problem List   Diagnosis Date Noted  . Cognitive decline 07/01/2016  . Uncontrolled hypertension 05/30/2016  . Sinus bradycardia 05/30/2016  . Left hip pain 04/02/2016  . Secondary hyperparathyroidism of renal origin ([place]) 03/15/2016  . Abdominal aortic atherosclerosis ([place]) 07/19/2015  . Stool incontinence 06/26/2015  . Urinary incontinence 05/21/2015  . Rib pain on right side 05/21/2015  . Advanced care planning/counseling discussion 03/07/2015  . Paresthesias 09/04/2014  . Alcohol use 09/04/2014  . Ruptured silicone breast implant 05/02/2014  . Hyponatremia 03/25/2014  . Diarrhea 12/12/2013  . Chest pain 03/26/2013  .  Bradycardia 08/18/2012  . Syncope and collapse 04/06/2012  . Chronic back pain 03/29/2012  . COPD GOLD III 10/26/2011  . Diastolic dysfunction   . Medicare annual wellness visit, subsequent 12/09/2010  . Dysphagia 07/10/2010  . History of diverticulitis of colon   . HLD (hyperlipidemia)   . Anxiety   . MDD (major depressive disorder) ([place])   . GERD (gastroesophageal reflux disease)   . IBS (irritable bowel syndrome)   . Allergic  rhinitis   . Migraine   . HTN (hypertension)   . SUI (stress urinary incontinence, female)   . History of CVA (cerebrovascular accident) 01/18/2010  . CKD (chronic kidney disease) stage 4, GFR 15-29 ml/min (HCC) 12/01/2009  . Vitamin D deficiency 08/21/2008  . Anemia in chronic kidney disease 08/21/2008  . Osteoporosis 05/19/2007  . Insomnia 05/19/2007    Past Surgical History:  Procedure Laterality Date  . BREAST ENHANCEMENT SURGERY  1982  . COLONOSCOPY  2002   does not want another! severe diverticulosis with luminal narrowing and spasm Deatra Ina)  . CT HEAD LIMITED W/O CM  04/2010   nothing acute, ? old infarcts L internal capsule  . DEXA  08/2013   Spine -2.2, hip -3.5  . ESOPHAGOGASTRODUODENOSCOPY  2012   WNL, s/p esoph dilation (Dr. Candace Cruise, [facility])  . macroplastique implantation  03/2008   Dr. Rogers Blocker  . renal doppler  2009?   simple cysts, wnl  . submucous resection  1962  . TONSILLECTOMY  1942  . TOTAL ABDOMINAL HYSTERECTOMY  1996   for frequent dysmennorhea  . TUBAL LIGATION  1973  . uretherolysis and removal of macroplastique  09/13/08   Dr. Amalia Hailey  . US ECHOCARDIOGRAPHY  11/2010   EF 55-60%, mild LAE, mild diastolic dysfunction, normal valves    OB History    No data available       Home Medications    Prior to Admission medications   Medication Sig Start Date End Date Taking? Authorizing Provider  acetaminophen (TYLENOL) 500 MG tablet Take 1,000 mg by mouth 2 (two) times daily as needed (pain).    Yes [provider]  aspirin EC 81 MG tablet Take 81 mg by mouth daily.   Yes [provider]  bisoprolol (ZEBETA) 5 MG tablet Take 1 tablet (5 mg total) by mouth daily. 03/23/16  Yes Martinique, Peter M, MD  calcitRIOL (ROCALTROL) 0.25 MCG capsule Take 0.25 mcg by mouth 3 (three) times a week.    Yes [provider]  furosemide (LASIX) 20 MG tablet Take 30 mg by mouth daily. 07/08/16  Yes [provider]  gabapentin (NEURONTIN) 100 MG  capsule TAKE ONE CAPSULE TWICE A DAY Patient taking differently: TAKE 100MG  TWICE A DAY 06/02/16  Yes Ria Bush, MD  Melatonin 3 MG CAPS Take 1 capsule (3 mg total) by mouth at bedtime as needed. Patient taking differently: Take 1 capsule by mouth at bedtime as needed (sleep).  07/08/16  Yes Ria Bush, MD  Probiotic Product (VSL#3) CAPS TAKE 1 CAPSULE BY MOUTH DAILY. 08/02/16  Yes Nandigam, Venia Minks, MD  traMADol (ULTRAM) 50 MG tablet Take 1 tablet (50 mg total) by mouth 2 (two) times daily as needed for moderate pain. 08/19/16  Yes Ria Bush, MD  ranitidine (ZANTAC) 150 MG tablet Take 1 tablet (150 mg total) by mouth at bedtime. 07/12/16   Ria Bush, MD    Family History Family History  Problem Relation Age of Onset  . Heart failure Father   . Hypertension  Father   . Coronary artery disease Father   . Prostate cancer Father   . Leukemia Mother        CLL prior  . Breast cancer Maternal Aunt   . Breast cancer Paternal Aunt   . Colon cancer Neg Hx   . Esophageal cancer Neg Hx   . Stomach cancer Neg Hx   . Pancreatic cancer Neg Hx   . Liver disease Neg Hx     Social History Social History  Substance Use Topics  . Smoking status: Former Smoker    Packs/day: 0.50    Years: 54.00    Types: Cigarettes    Start date: 01/18/1954    Quit date: 01/19/2008  . Smokeless tobacco: Never Used  . Alcohol use 0.0 oz/week     Comment: 1 1/2-2 glasses white wine/day     Allergies   Amlodipine; Doxycycline monohydrate; and Ramipril   Review of Systems Review of Systems  Constitutional: Negative for fever.  Respiratory: Negative for shortness of breath.   Gastrointestinal: Negative for abdominal pain, constipation, diarrhea, nausea and vomiting.  Genitourinary: Negative for dysuria and frequency.  Hematological: Does not bruise/bleed easily.  All other systems reviewed and are negative.    Physical Exam Updated Vital Signs BP (!) 189/90   Pulse 60   Temp  97.8 F (36.6 C) (Oral)   Resp 16   Ht 5\' 3"  (1.6 m)   Wt 50.3 kg (111 lb)   SpO2 100%   BMI 19.66 kg/m   Physical Exam Physical Exam  Nursing note and vitals reviewed. Constitutional: Well developed, well nourished, non-toxic, and in no acute distress Head: Normocephalic and atraumatic.  Mouth/Throat: Oropharynx is clear and moist.  Neck: Normal range of motion. Neck supple.  Cardiovascular: Normal rate and regular rhythm.   Pulmonary/Chest: Effort normal and breath sounds normal.  Abdominal: Soft. There is no tenderness. There is no rebound and no guarding.  nonthrombosed external hemorrhoids. Brown stool. No rectal mass or appreciable lesion on rectal exam.  Musculoskeletal: Normal range of motion.  Neurological: Alert, no facial droop, fluent speech, moves all extremities symmetrically Skin: Skin is warm and dry.  Psychiatric: Cooperative   ED Treatments / Results  Labs (all labs ordered are listed, but only abnormal results are displayed) Labs Reviewed  COMPREHENSIVE METABOLIC PANEL - Abnormal; Notable for the following:       Result Value   Potassium 5.2 (*)    CO2 21 (*)    Glucose, Bld 116 (*)    BUN 46 (*)    Creatinine, Ser 3.21 (*)    ALT 8 (*)    GFR calc non Af Amer 12 (*)    GFR calc Af Amer 14 (*)    All other components within normal limits  CBC - Abnormal; Notable for the following:    RBC 3.81 (*)    Hemoglobin 11.7 (*)    All other components within normal limits  URINALYSIS, ROUTINE W REFLEX MICROSCOPIC - Abnormal; Notable for the following:    Protein, ur >=300 (*)    Leukocytes, UA SMALL (*)    Bacteria, UA RARE (*)    Squamous Epithelial / LPF 0-5 (*)    All other components within normal limits  LIPASE, BLOOD - Abnormal; Notable for the following:    Lipase 55 (*)    All other components within normal limits    EKG  EKG Interpretation None       Radiology Ct Abdomen Pelvis Wo  Contrast  Result Date: 08/20/2016 CLINICAL DATA:   Diffuse abdominal pain for the past week. Rectal mass when the patient woke up this morning. The patient reduced the mass with continued pain in that region now. EXAM: CT ABDOMEN AND PELVIS WITHOUT CONTRAST TECHNIQUE: Multidetector CT imaging of the abdomen and pelvis was performed following the standard protocol without IV contrast. COMPARISON:  Abdomen ultrasound dated 02/02/2016 and abdomen and pelvis CT dated 07/30/2015. FINDINGS: Lower chest: Dense mitral valve annulus calcification. 4 mm left lower lobe nodule on image number 2 of series 5, better demonstrated today with thinner slices. Hepatobiliary: Multiple gallstones in the gallbladder measuring up to 5 mm in maximum diameter each. No gallbladder wall thickening or pericholecystic fluid. Unremarkable liver. Pancreas: Unremarkable. No pancreatic ductal dilatation or surrounding inflammatory changes. Spleen: Normal in size without focal abnormality. Adrenals/Urinary Tract: Stable thickening of the left adrenal gland and normal appearing right adrenal gland. Tiny peripheral cortical calcification in the upper pole of the right kidney. Stable cyst in the mid left kidney and small exophytic cyst arising from the lower pole of the left kidney. A larger exophytic cyst arising from the inferior aspect of the lower pole of the left kidney has not changed significantly. No bladder or ureteral calculi and no hydronephrosis. Stomach/Bowel: Large number of colonic diverticula without evidence diverticulitis. No rectal mass seen. Normal appearing appendix, stomach and small bowel. Vascular/Lymphatic: Atheromatous arterial calcifications without aneurysm. No enlarged lymph nodes. Reproductive: Uterus and bilateral adnexa are unremarkable. Other: Stable macroplastique implants for stress urinary incontinence. Very small umbilical hernia containing fat. Musculoskeletal: Bilateral subarticular acetabular cysts. Lumbar spine degenerative changes, including facet degenerative  changes with with stable grade 1 anterolisthesis at the L3-4 level. IMPRESSION: 1. No acute abnormality. 2. Extensive colonic diverticulosis. 3. Cholelithiasis. 4. 4 mm left lower lobe lung nodule. No follow-up needed if patient is low-risk. Non-contrast chest CT can be considered in 12 months if patient is high-risk. This recommendation follows the consensus statement: Guidelines for Management of Incidental Pulmonary Nodules Detected on CT Images: From the Fleischner Society 2017; Radiology 2017; 284:228-243. Electronically Signed   By: Claudie Revering M.D.   On: 08/20/2016 20:15    Procedures Procedures (including critical care time)  Medications Ordered in ED Medications  sodium chloride 0.9 % bolus 1,000 mL (0 mLs Intravenous Stopped 08/20/16 2047)     Initial Impression / Assessment and Plan / ED Course  I have reviewed the triage vital signs and the nursing notes.  Pertinent labs & imaging results that were available during my care of the patient were reviewed by me and considered in my medical decision making (see chart for details).     83 year old female who presents with something that prolapse from her rectum. She had self reduced cyst on my exam her rectal exam is reassuring. No evidence of rectal bleeding and she is not significantly tender. No appreciable mass. Her abdomen is soft and benign. I did obtain CT abdomen pelvis for evaluation of mass or other acute intra-abdominal or pelvic process. Discussed this does not show any serious intra-abdominal processes. She does have incidental 4 mm lung nodule which she is notified about and will follow up with PCP regarding need for reimaging in the future. Question of maybe she had a rectal prolapse that she self reduced. Given follow-up for colorectal surgery as needed. Strict return and follow-up instructions reviewed. She expressed understanding of all discharge instructions and felt comfortable with the plan of care.   Final Clinical  Impressions(s) / ED Diagnoses   Final diagnoses:  Rectal prolapse  Lung nodule    New Prescriptions New Prescriptions   No medications on file     Forde Dandy, MD 08/20/16 2123

## 2016-08-20 NOTE — Telephone Encounter (Signed)
Attempted to call patient on mobile, no answer, vm has not been set up yet

## 2016-08-23 NOTE — Telephone Encounter (Signed)
Pt called; when pt was in ED on 08/20/16 BP was 189/90 P 60; and pt had rectal prolapse; pt said she feels OK today; rectal area is tender; pt is to contact [facility] for an appt but has not called them yet. Pt request cb with what Dr Darnell Level said about BP.
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
## 2016-08-24 NOTE — Telephone Encounter (Signed)
Recommend keep log of blood pressures at home and bring to next appt to review.

## 2016-08-25 NOTE — Telephone Encounter (Addendum)
Patient does not need repeat referral. She saw Dr Silverio Decamp 07/30/2016 at which time she just needed to call back for f/u appt if needed (f/u PRN). Me placing a referral has nothing to do with her receiving or not receiving treatment.  Recommend pt call GI office to schedule appointment f/u rectal prolapse, or she could go to gen surgery (and then I would need to place new referral).  Will cc Mandy for continuity.

## 2016-08-25 NOTE — Telephone Encounter (Signed)
Patient was upset due to the fact that she saw her GI doctor back in July 2018 and was unable to be treated due to the fact she was not referred there. Unsure why she was seen that day if no treatment was given. She is needing a referral again so that she may be treated. Also explain patient to keep logs of her BP, and she stated she cannot keep logs because she her BP cuff is put away and not able to find it. Advise patient she may go to a local pharmacy and purchase a new cuff. Advise patient she will need to monitor her BP 2-3 times a day. Is a referral being made? She stated she asked back in July

## 2016-08-27 ENCOUNTER — Telehealth: Payer: Self-pay | Admitting: Gastroenterology

## 2016-08-27 NOTE — Telephone Encounter (Signed)
Called Mandy back to let her know that rectal prolapse will need to be seen by the general surgeons at [facility]. She will contact them to make referral.

## 2016-08-27 NOTE — Telephone Encounter (Signed)
Left message with Dr. Woodward Ku nurse to discuss patient's present condition and whether a referral to general surgery is the best option at this point, or if GI is wanting to see her again first.  Also, spoke with daughter in law Olin Hauser (ok per [facility]) to check on patient and discuss her wishes moving forward.  Olin Hauser states that she seems to be better this week and is unsure whether she will want to pursue this further right now.  She is going to discuss with her and call us back on Monday with an update.

## 2016-08-30 ENCOUNTER — Telehealth: Payer: Self-pay | Admitting: Gastroenterology

## 2016-08-30 NOTE — Telephone Encounter (Signed)
Confirmed it will be surgical referral.

## 2016-09-01 NOTE — Telephone Encounter (Signed)
On Monday, spoke with Dr. Woodward Ku nurse who states that at this point it would be best for patient to see general surgery.  I have not heard back from patient/daughter in law regarding whether she would like to pursue this further.  If so, patient will need to be referred to general surgery for further treatment.

## 2016-09-13 ENCOUNTER — Ambulatory Visit (INDEPENDENT_AMBULATORY_CARE_PROVIDER_SITE_OTHER): Payer: Medicare Other | Admitting: Family Medicine

## 2016-09-13 ENCOUNTER — Encounter: Payer: Self-pay | Admitting: Family Medicine

## 2016-09-13 VITALS — BP 148/82 | HR 52 | Temp 98.1°F | Wt 111.5 lb

## 2016-09-13 DIAGNOSIS — K6289 Other specified diseases of anus and rectum: Secondary | ICD-10-CM

## 2016-09-13 DIAGNOSIS — M545 Low back pain, unspecified: Secondary | ICD-10-CM

## 2016-09-13 DIAGNOSIS — G8929 Other chronic pain: Secondary | ICD-10-CM | POA: Diagnosis not present

## 2016-09-13 DIAGNOSIS — R4189 Other symptoms and signs involving cognitive functions and awareness: Secondary | ICD-10-CM

## 2016-09-13 DIAGNOSIS — F3342 Major depressive disorder, recurrent, in full remission: Secondary | ICD-10-CM | POA: Diagnosis not present

## 2016-09-13 DIAGNOSIS — N184 Chronic kidney disease, stage 4 (severe): Secondary | ICD-10-CM

## 2016-09-13 LAB — RENAL FUNCTION PANEL
Albumin: 3.9 g/dL (ref 3.5–5.2)
BUN: 34 mg/dL — ABNORMAL HIGH (ref 6–23)
CHLORIDE: 103 meq/L (ref 96–112)
CO2: 27 mEq/L (ref 19–32)
Calcium: 9.8 mg/dL (ref 8.4–10.5)
Creatinine, Ser: 2.4 mg/dL — ABNORMAL HIGH (ref 0.40–1.20)
GFR: 20.52 mL/min — AB (ref >60.00)
GLUCOSE: 106 mg/dL — AB (ref 70–99)
PHOSPHORUS: 4.6 mg/dL (ref 2.3–4.6)
POTASSIUM: 4.8 meq/L (ref 3.5–5.1)
SODIUM: 137 meq/L (ref 135–145)

## 2016-09-13 NOTE — Patient Instructions (Addendum)
Labs rechecked today.  Call me with name of medicines not on your list.  Continue tramadol but use sparingly.  Return in 3 months for follow up visit. If labs not doing well, return in 1 month for follow up (we will let you know with upcoming labwork results)

## 2016-09-13 NOTE — Assessment & Plan Note (Signed)
On intermittent tramadol for this. Reviewed concerns with tramadol use in elderly, concern over increased confusion and constipation/GI issues with pain medications. Continue to encourage sparing tramadol use PRN pain.

## 2016-09-13 NOTE — Assessment & Plan Note (Signed)
Overall stable period off sertraline. Desires to stay off antidepressants.

## 2016-09-13 NOTE — Assessment & Plan Note (Signed)
Rectal prolapse vs hemorrhoid related. Regardless this has now resolved. She has decided to defer gen surgery evaluation, but is aware to notify us right away if symptoms recur.
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
## 2016-09-13 NOTE — Assessment & Plan Note (Signed)
She and DIL will call us with names of medications that she continues to take at home but that aren't on our medication list.  Deferred MMSE today given concern over uremia as cause of some of her recent confusion - will recheck Cr today.

## 2016-09-13 NOTE — Assessment & Plan Note (Signed)
Recent deterioration at ER 08/20/2016 with Cr 3.21, GFR 12%. Denies significant dehydration. There was some increased confusion around that time. Will recheck today. Reviewed latest note from Dr Juleen China. Pt states she has not discuss any form of dialysis. Renal notes mention possible evaluation for peritoneal home dialysis.

## 2016-09-15 ENCOUNTER — Encounter: Payer: Self-pay | Admitting: *Deleted

## 2016-09-23 ENCOUNTER — Other Ambulatory Visit: Payer: Self-pay | Admitting: Family Medicine

## 2016-09-23 NOTE — Telephone Encounter (Signed)
plz phone in. 

## 2016-09-23 NOTE — Telephone Encounter (Signed)
Rx called in to requested pharmacy 

## 2016-09-24 IMAGING — CT CT ABD-PELV W/O CM
2 of 4 series · 16 of 46 positions shown, 18 images · non-contrast
Comparison: None.

CLINICAL DATA: Generalized abdominal pain.

EXAM:
CT ABDOMEN AND PELVIS WITHOUT CONTRAST
TECHNIQUE: Multidetector CT imaging of the abdomen and pelvis was performed
following the standard protocol without IV contrast.

[Series 2: abd/ pelvis 5.0 i40f 2 · axial · 0.68mm/px · z∈[+797,+1142]mm · 13 of 77 slices shown, 15 images]
[im 4/77  soft-tissue]
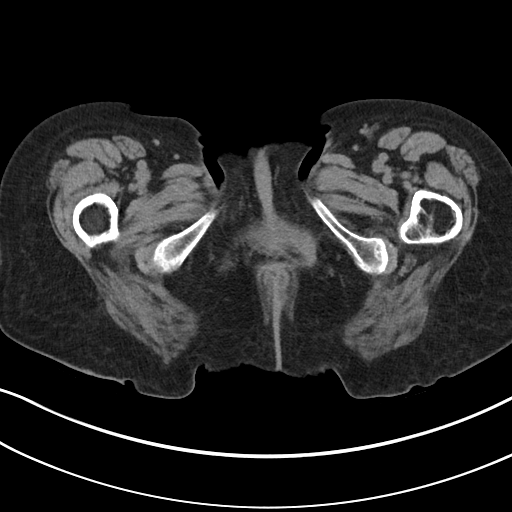
[im 4/77  bone]
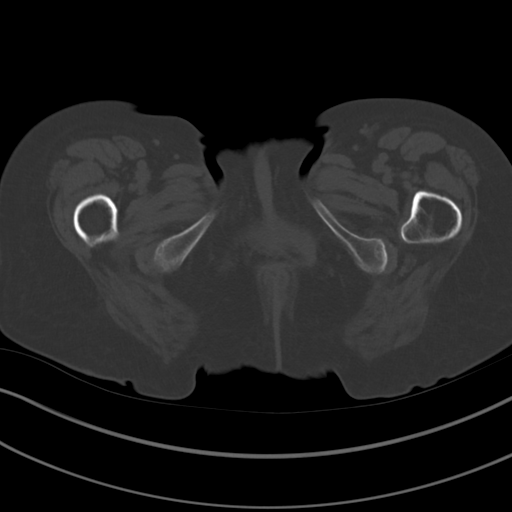
[im 10/77  soft-tissue]
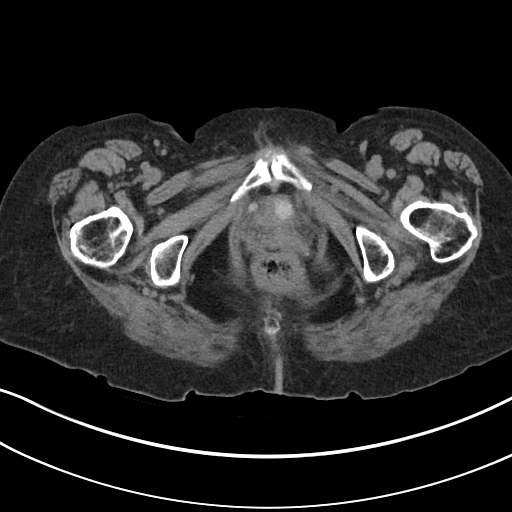
[im 16/77  soft-tissue]
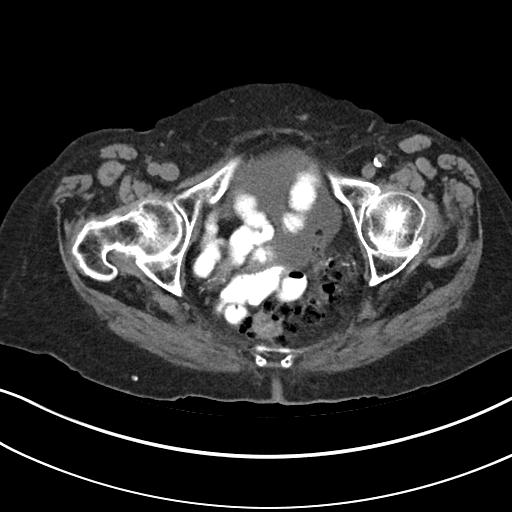
[im 22/77  soft-tissue]
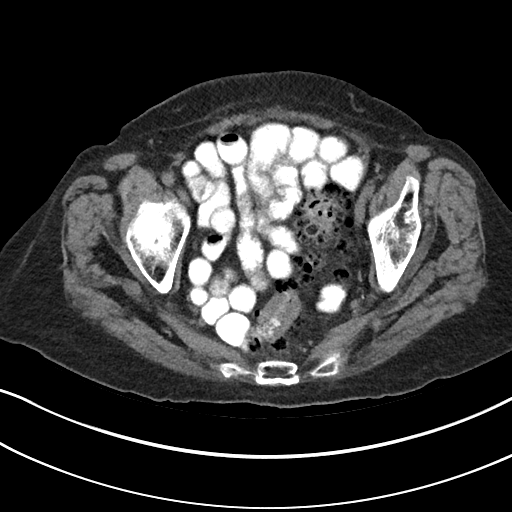
[im 28/77  soft-tissue]
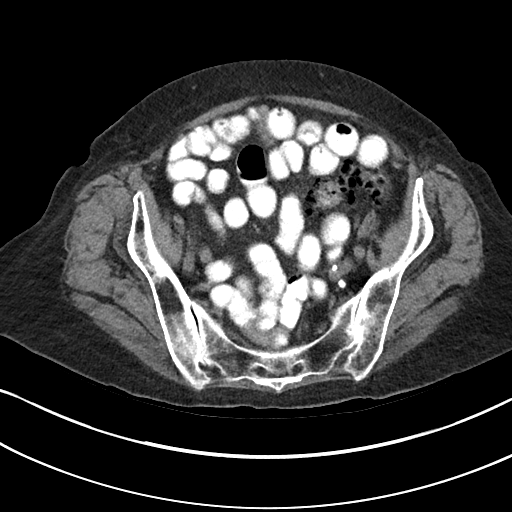
[im 34/77  soft-tissue]
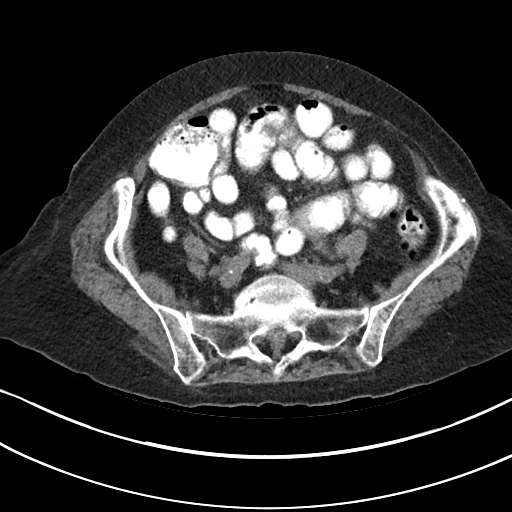
[im 40/77  soft-tissue]
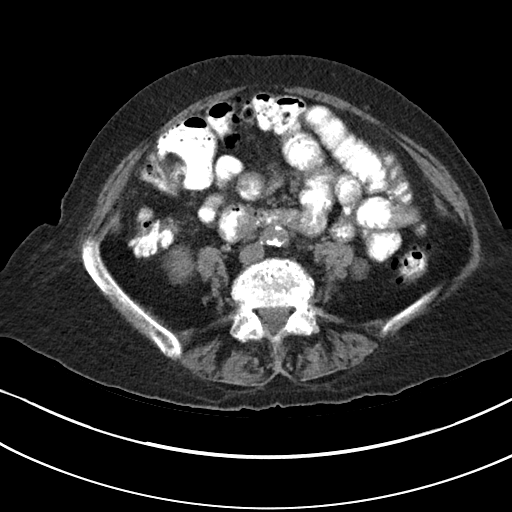
[im 43/77  soft-tissue]
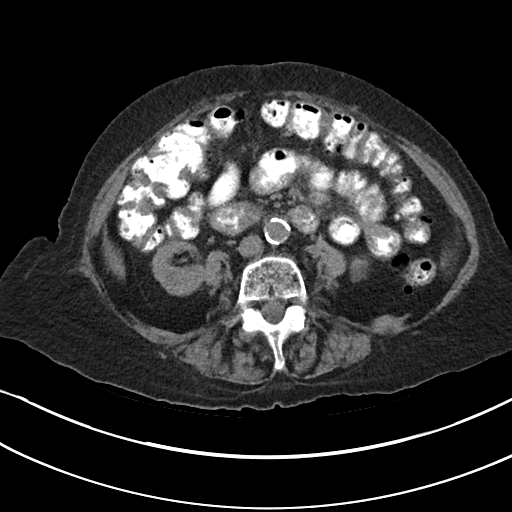
[im 49/77  soft-tissue]
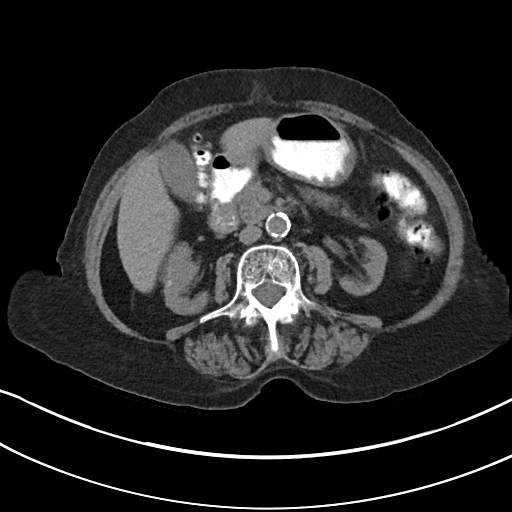
[im 49/77  bone]
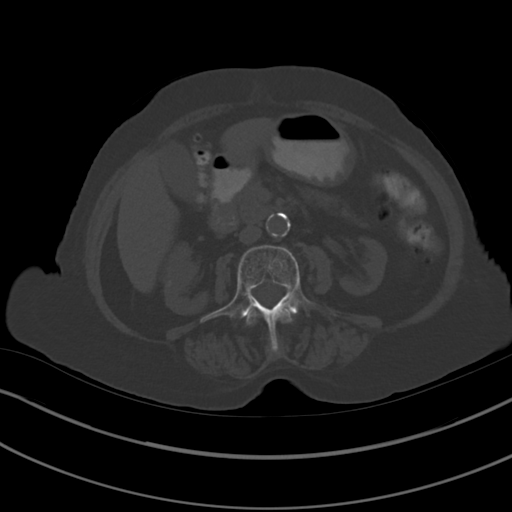
[im 55/77  soft-tissue]
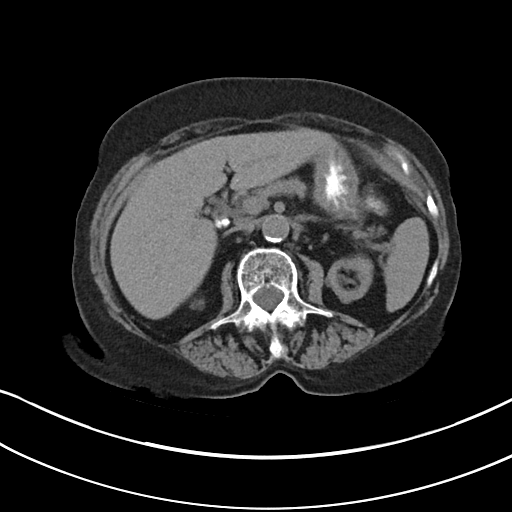
[im 61/77  soft-tissue]
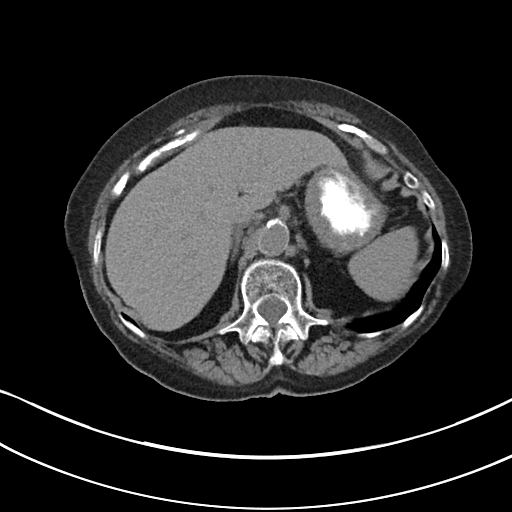
[im 67/77  soft-tissue]
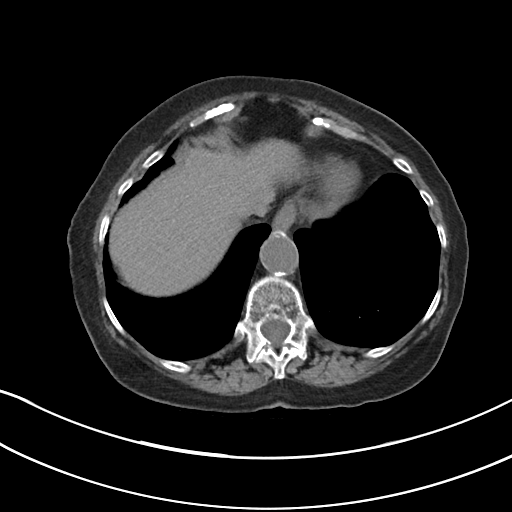
[im 73/77  soft-tissue]
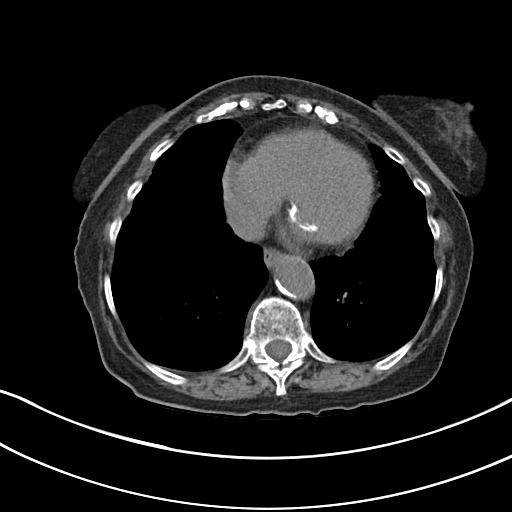

[Series 5: cor st · coronal · 0.59mm/px · 3 of 80 slices shown]
[im 27/80  soft-tissue]
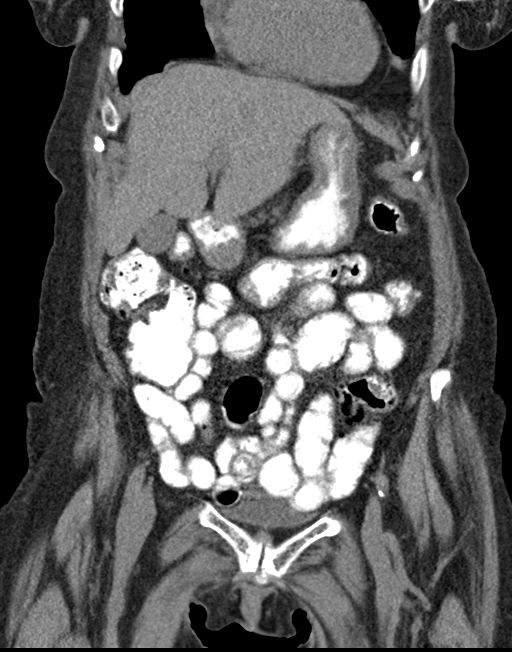
[im 36/80  soft-tissue]
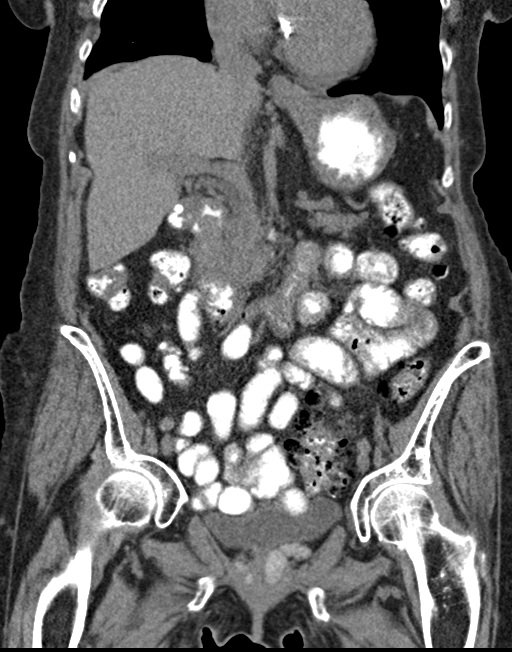
[im 44/80  soft-tissue]
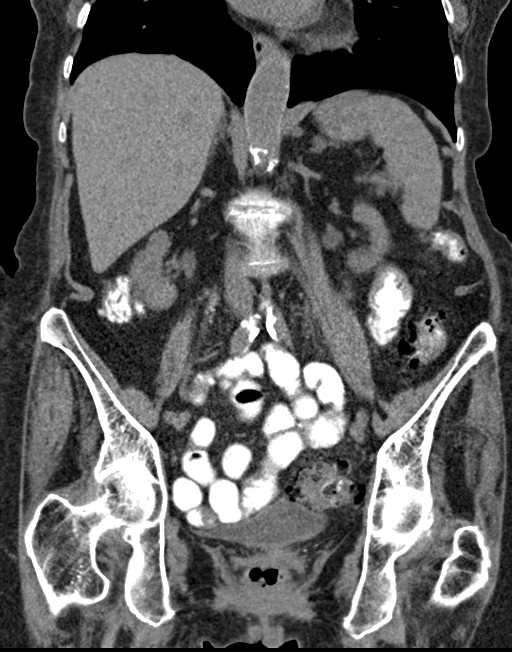

[16 of 46 positions shown; findings below may reference images not displayed]

FINDINGS: Multilevel degenerative disc disease is noted in the lower lumbar
spine. Visualized lung bases are unremarkable.

Cholelithiasis is noted. No focal abnormality is noted in the liver,
spleen or pancreas on these unenhanced images. Adrenal glands appear
normal. Bilateral renal atrophy is noted. No hydronephrosis or renal
obstruction is noted. No renal or ureteral calculi are noted.
Atherosclerosis of abdominal aorta is noted without aneurysm
formation. There is no evidence of bowel obstruction. The appendix
appears normal. Diverticulosis of descending and sigmoid colon is
noted without inflammation. Status post hysterectomy. Urinary
bladder appears normal. Rounded high density areas are seen inferior
to the bladder in the pelvic floor consistent with surgical
injectable material as treatment for incontinence or sphincter
deficiency. No significant adenopathy is noted.
IMPRESSION: Aortic atherosclerosis.

Cholelithiasis without evidence of cholecystitis.

Diverticulosis of descending and sigmoid colon without inflammation.

No acute abnormality seen in the abdomen or pelvis.

## 2016-09-28 ENCOUNTER — Telehealth: Payer: Self-pay

## 2016-09-28 NOTE — Telephone Encounter (Signed)
Pt was seen 09/13/16 and ankles were not swollen; now ankles are swelling; feet and lower legs are not really swollen. No SOB or difficulty breathing; pt has been taking Lasix 30 mg daily. Pt is not sure if swelling in ankles goes down over night or not. Pt request cb with what to do. [facility] .

## 2016-09-28 NOTE — Telephone Encounter (Signed)
I recommend elevating legs during the day, continued efforts at good water intake and avoiding salt/sodium in diet. Read labels to avoid high sodium foods. Goal 1500mg /day.  Continue lasix 30mg  daily for now. Let us know if lower legs or feet start swelling

## 2016-09-29 NOTE — Telephone Encounter (Signed)
Spoke with pt, relayed message per Dr. Darnell Level. Pt says ok and reports she has no swelling today.

## 2016-09-30 NOTE — Telephone Encounter (Addendum)
Pt called and thought a rx was going to be sent to pharmacy;I gave pt instructions Dr Darnell Level left at 09/28/16 note and pt voiced understanding; she is still taking lasix 30 mg po daily and is keeping her feet and legs elevated and is not having swelling. Pt will cb if legs and feet begin to swell. FYI to Dr Darnell Level. (pt is aware we will be in the office tomorrow morning but not the afternoon.)

## 2016-09-30 NOTE — Telephone Encounter (Signed)
Agree. No med changes at this time.

## 2016-10-16 ENCOUNTER — Other Ambulatory Visit: Payer: Self-pay | Admitting: Family Medicine

## 2016-10-17 NOTE — Telephone Encounter (Signed)
plz phone in. 

## 2016-10-18 NOTE — Telephone Encounter (Signed)
Refill left on vm at pharmacy.  

## 2016-10-24 ENCOUNTER — Other Ambulatory Visit: Payer: Self-pay | Admitting: Gastroenterology

## 2016-11-03 ENCOUNTER — Encounter (HOSPITAL_COMMUNITY): Payer: Self-pay | Admitting: Family Medicine

## 2016-11-03 ENCOUNTER — Ambulatory Visit (HOSPITAL_COMMUNITY)
Admission: EM | Admit: 2016-11-03 | Discharge: 2016-11-03 | Disposition: A | Discharge status: Home / Self Care | Payer: Medicare Other | Attending: Family Medicine | Admitting: Family Medicine

## 2016-11-03 ENCOUNTER — Telehealth: Payer: Self-pay | Admitting: Family Medicine

## 2016-11-03 DIAGNOSIS — Z87891 Personal history of nicotine dependence: Secondary | ICD-10-CM | POA: Insufficient documentation

## 2016-11-03 DIAGNOSIS — F419 Anxiety disorder, unspecified: Secondary | ICD-10-CM | POA: Diagnosis not present

## 2016-11-03 DIAGNOSIS — J449 Chronic obstructive pulmonary disease, unspecified: Secondary | ICD-10-CM | POA: Insufficient documentation

## 2016-11-03 DIAGNOSIS — R300 Dysuria: Secondary | ICD-10-CM | POA: Diagnosis present

## 2016-11-03 DIAGNOSIS — I129 Hypertensive chronic kidney disease with stage 1 through stage 4 chronic kidney disease, or unspecified chronic kidney disease: Secondary | ICD-10-CM | POA: Insufficient documentation

## 2016-11-03 DIAGNOSIS — N3000 Acute cystitis without hematuria: Secondary | ICD-10-CM | POA: Diagnosis not present

## 2016-11-03 DIAGNOSIS — K589 Irritable bowel syndrome without diarrhea: Secondary | ICD-10-CM | POA: Diagnosis not present

## 2016-11-03 DIAGNOSIS — N184 Chronic kidney disease, stage 4 (severe): Secondary | ICD-10-CM | POA: Insufficient documentation

## 2016-11-03 DIAGNOSIS — F329 Major depressive disorder, single episode, unspecified: Secondary | ICD-10-CM | POA: Insufficient documentation

## 2016-11-03 DIAGNOSIS — K219 Gastro-esophageal reflux disease without esophagitis: Secondary | ICD-10-CM | POA: Insufficient documentation

## 2016-11-03 DIAGNOSIS — E785 Hyperlipidemia, unspecified: Secondary | ICD-10-CM | POA: Insufficient documentation

## 2016-11-03 DIAGNOSIS — M5136 Other intervertebral disc degeneration, lumbar region: Secondary | ICD-10-CM | POA: Insufficient documentation

## 2016-11-03 DIAGNOSIS — R350 Frequency of micturition: Secondary | ICD-10-CM | POA: Diagnosis present

## 2016-11-03 DIAGNOSIS — R3915 Urgency of urination: Secondary | ICD-10-CM | POA: Diagnosis not present

## 2016-11-03 LAB — POCT URINALYSIS DIP (DEVICE)
Bilirubin Urine: NEGATIVE
Glucose, UA: NEGATIVE mg/dL
Hgb urine dipstick: NEGATIVE
KETONES UR: NEGATIVE mg/dL
Nitrite: NEGATIVE
PH: 6.5 (ref 5.0–8.0)
PROTEIN: 100 mg/dL — AB
Specific Gravity, Urine: 1.01 (ref 1.005–1.030)
Urobilinogen, UA: 0.2 mg/dL (ref 0.0–1.0)

## 2016-11-03 MED ORDER — CEPHALEXIN 500 MG PO CAPS: 500.0000 mg | ORAL_CAPSULE | Freq: Two times a day (BID) | ORAL | 0 refills | Status: DC

## 2016-11-03 MED ORDER — ACETAMINOPHEN 325 MG PO TABS
ORAL_TABLET | ORAL | Status: AC
  Filled 2016-11-03: qty 2

## 2016-11-03 NOTE — ED Triage Notes (Signed)
Pt here for back pain, fever, dysuria, took ASA earlier.

## 2016-11-03 NOTE — Telephone Encounter (Signed)
[facility]     Patient Name: [name] Initial Comment [name] states she has a fever, cough, and stiffness in her lower back.  DOB: 03/10/1932      Nurse Assessment  Nurse: Jimmey Ralph, RN, Lissa Date/Time (Eastern Time): 11/03/2016 3:59:17 PM  Confirm and document reason for call. If symptomatic, describe symptoms. ---[name] states she has a fever, cough, and stiffness in her lower back. These symptoms started I believe it started today. I am thinking I may have been unconsious because I can't remember anything except sunday. I have been in bed for days. The cough is more like drainage. No dizziness now.  Does the patient have any new or worsening symptoms? ---Yes  Will a triage be completed? ---Yes  Related visit to physician within the last 2 weeks? ---No  Does the PT have any chronic conditions? (i.e. diabetes, asthma, etc.) ---Yes  List chronic conditions. ---HTN ~ medication Kidney issues  Is this a behavioral health or substance abuse call? ---No    Guidelines     Guideline Title Affirmed Question Affirmed Notes   Flank Pain Patient sounds very sick or weak to the triager    Final Disposition User   Go to ED Now (or PCP triage) Hammonds, RN, Lissa   Comments   number should be [phone]   I am hot like I have fever and I feel sticky on my skin. I am able to get up and walk around. I am more hot than painful. I don't have a thermometer.   I have one leg and hip that has nerve damage but no new symptoms.   [name] is going to UC now.   Referrals   [facility] Disagree/[facility] Understands Yes  PreDisposition Did not know what to do

## 2016-11-05 LAB — URINE CULTURE: Culture: <10000 — AB
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
## 2016-11-07 ENCOUNTER — Other Ambulatory Visit: Payer: Self-pay | Admitting: Family Medicine

## 2016-11-13 DIAGNOSIS — Z23 Encounter for immunization: Secondary | ICD-10-CM | POA: Diagnosis not present

## 2016-11-15 DIAGNOSIS — N2581 Secondary hyperparathyroidism of renal origin: Secondary | ICD-10-CM | POA: Diagnosis not present

## 2016-11-15 DIAGNOSIS — E872 Acidosis: Secondary | ICD-10-CM | POA: Diagnosis not present

## 2016-11-15 DIAGNOSIS — N184 Chronic kidney disease, stage 4 (severe): Secondary | ICD-10-CM | POA: Diagnosis not present

## 2016-11-15 DIAGNOSIS — R809 Proteinuria, unspecified: Secondary | ICD-10-CM | POA: Diagnosis not present

## 2016-11-15 DIAGNOSIS — I129 Hypertensive chronic kidney disease with stage 1 through stage 4 chronic kidney disease, or unspecified chronic kidney disease: Secondary | ICD-10-CM | POA: Diagnosis not present

## 2016-11-15 DIAGNOSIS — E875 Hyperkalemia: Secondary | ICD-10-CM | POA: Diagnosis not present

## 2016-11-22 ENCOUNTER — Other Ambulatory Visit: Payer: Self-pay | Admitting: Family Medicine

## 2016-11-22 NOTE — Telephone Encounter (Signed)
Last filled 10/22/16, #50 Last OV:  09/13/16 Next OV:  12/20/16 CSA:  04/09/14 UDS:  04/03/14

## 2016-11-23 NOTE — Telephone Encounter (Signed)
Please call in.  Thanks.   

## 2016-11-24 NOTE — Telephone Encounter (Signed)
Refill left on vm at pharmacy per Dr. Damita Dunnings

## 2016-12-15 ENCOUNTER — Ambulatory Visit: Payer: Medicare Other | Admitting: Family Medicine

## 2016-12-20 ENCOUNTER — Ambulatory Visit: Payer: Medicare Other | Admitting: Family Medicine

## 2016-12-25 ENCOUNTER — Other Ambulatory Visit: Payer: Self-pay | Admitting: Family Medicine

## 2016-12-28 ENCOUNTER — Telehealth: Payer: Self-pay | Admitting: Family Medicine

## 2016-12-28 NOTE — Telephone Encounter (Signed)
Copied from [place]. Topic: Quick Communication - Rx Refill/Question >> Dec 28, 2016  4:21 PM Arletha Grippe wrote: Has the patient contacted their pharmacy? Yes.     (Agent: If no, request that the patient contact the pharmacy for the refill.)   Preferred Pharmacy (with phone number or street name): [facility] [address]. Pt cb number is [phone]. [facility] for [facility]ill on this med.    Agent: Please be advised that RX refills may take up to 3 business days. We ask that you follow-up with your pharmacy.

## 2016-12-29 ENCOUNTER — Other Ambulatory Visit: Payer: Self-pay | Admitting: *Deleted

## 2016-12-29 MED ORDER — TRAMADOL HCL 50 MG PO TABS: 50.0000 mg | ORAL_TABLET | Freq: Two times a day (BID) | ORAL | 0 refills | Status: DC | PRN

## 2016-12-29 NOTE — Telephone Encounter (Signed)
Called in refill per Dr. Darnell Level.

## 2016-12-29 NOTE — Telephone Encounter (Signed)
Patient called again concerning the refill on her Tramadol medication.  She stated that she has been trying to get a refill since Saturday, December 8th, and she really needs her medication.

## 2017-01-21 ENCOUNTER — Other Ambulatory Visit: Payer: Self-pay | Admitting: Gastroenterology

## 2017-01-24 ENCOUNTER — Telehealth: Payer: Self-pay | Admitting: *Deleted

## 2017-01-24 NOTE — Telephone Encounter (Signed)
We can discuss this at [place].

## 2017-01-24 NOTE — Telephone Encounter (Signed)
Copied from [place]. Topic: Inquiry >> Jan 24, 2017  2:42 PM Nicole English, NT wrote: Reason for CRM: patient states she has an appt on 02/09/17 and she wants to get a xray on her left thigh and lower back also. She states she wakes up in the middle of the night and feels like her bones are punching her.

## 2017-01-27 ENCOUNTER — Other Ambulatory Visit: Payer: Self-pay | Admitting: Family Medicine

## 2017-01-27 NOTE — Telephone Encounter (Signed)
Last filled:  12/29/16, #50 Last OV:  09/13/16 Next OV:  02/09/17 CSA:  01/08/14 UDS:  09/03/14

## 2017-01-28 NOTE — Telephone Encounter (Signed)
Sent electronically 

## 2017-01-29 ENCOUNTER — Other Ambulatory Visit: Payer: Self-pay | Admitting: Cardiology

## 2017-01-31 ENCOUNTER — Telehealth: Payer: Self-pay

## 2017-01-31 NOTE — Telephone Encounter (Signed)
Rx(s) sent to pharmacy electronically.  

## 2017-01-31 NOTE — Telephone Encounter (Signed)
Unable to reach pt by phone. No answer and no v/m. 

## 2017-01-31 NOTE — Telephone Encounter (Signed)
Please schedule appt for evaluation ASAP.

## 2017-01-31 NOTE — Telephone Encounter (Signed)
PLEASE NOTE: All timestamps contained within this report are represented as [place] Standard Time. CONFIDENTIALTY NOTICE: This fax transmission is intended only for the addressee. It contains information that is legally privileged, confidential or otherwise protected from use or disclosure. If you are not the intended recipient, you are strictly prohibited from reviewing, disclosing, copying using or disseminating any of this information or taking any action in reliance on or regarding this information. If you have received this fax in error, please notify us immediately by telephone so that we can arrange for its return to [place]. Phone: [phone], Toll-Free: [phone], Fax: [phone] Page: 1 of 1 Call Id: [id number] [place] Patient Name: Nicole English Gender: Female DOB: 12-13-1932 Age: 84 Y 10 M 11 D Return Phone Number: [phone] (Secondary) Address: City/State/Zip: [place] [address] Client [facility] - Client Client Site [place] Physician Ria Bush - MD Contact Type Call Who Is Calling Patient / Member / Family / Caregiver Call Type Triage / Clinical Relationship To Patient Self Return Phone Number [phone] (Secondary) Chief Complaint Back Pain - General Reason for Call Symptomatic / Request for [place] states she is running a fever, she does not have a thermometer. She stated she is also feeling pain in her lower back and in the kidney area. Translation No Nurse Assessment Guidelines Guideline Title Affirmed Question Affirmed Notes Nurse Date/Time (Eastern Time) Disp. Time Eilene Ghazi Time) Disposition Final User 01/30/2017 1:37:45 PM Attempt made - message left [place], RN, Fraser Din 01/30/2017 1:58:36 PM Attempt made - message left Whited, RN, Fraser Din 01/30/2017 2:33:14 PM FINAL  ATTEMPT MADE - no message left Yes Whited, Therapist, sports, Fraser Din

## 2017-02-01 NOTE — Telephone Encounter (Signed)
Attempted again to contact pt. Still no answer. VM box has not been set up.  Pt needs to schedule OV ASAP per Dr. Darnell Level (fever, low back pain).

## 2017-02-01 NOTE — Telephone Encounter (Signed)
Andra with [facility] skyped this message; Regarding Patient Nicole English (MRN # [id number]) I was able to get in contact with her. I scheduled her for 7:15am on Friday 02/04/2017 with Dr. Darnell Level for 30 minutes.

## 2017-02-01 NOTE — Telephone Encounter (Addendum)
Attempted to contact pt. No answer. VM box has not been set up.  Need to try to reschedule pt for Wed, 02/02/17 at 1:15 per Dr. Venetia Maxon is too long to wait.

## 2017-02-01 NOTE — Telephone Encounter (Signed)
Attempted to contact pt. No answer. VM box has not been set up yet.  Needs to schedule OV ASAP per Dr. Danise [name] (fever, lower back pain).

## 2017-02-01 NOTE — Telephone Encounter (Signed)
Noted  

## 2017-02-01 NOTE — Telephone Encounter (Signed)
Can we try to get her in tomorrow 1:15pm? Friday is too long a wait.

## 2017-02-02 NOTE — Telephone Encounter (Signed)
Just tried to call pt again before leaving. Still no answer and not able to leave vm to call back.  Will try again tomorrow.

## 2017-02-02 NOTE — Telephone Encounter (Signed)
Can we keep trying to reach her to triage symptoms? If truly fever with flank pain, back pain, want her evaluated as soon as possible.

## 2017-02-02 NOTE — Telephone Encounter (Addendum)
Tried again to contact pt with no response. Not able to leave vm.  Do you want pt to keep 02/04/17 OV?

## 2017-02-03 NOTE — Telephone Encounter (Signed)
Attempted to contact pt. No answer. VM not set up.    Need to triage pt's sxs to see if she is still has a fever with flank and back pain.  Per Dr. Darnell Level, pt would need to be seen asap.

## 2017-02-03 NOTE — Telephone Encounter (Signed)
I spoke with pt and she is not having flank or back pain now. Pt thinks she may have the flu; when asked symptoms pt feels hot but pt has not taken temp. Also thinks may have cracked bone in upper lt leg between hip and knee. Pt cannot come for appt today. Pt would like later appt on 02/04/17. Rescheduled pt on 02/04/17 at 12:15 for 30' appt. Pt voiced understanding and will keep that appt. FYI to Dr Darnell Level.

## 2017-02-04 ENCOUNTER — Encounter: Payer: Self-pay | Admitting: Family Medicine

## 2017-02-04 ENCOUNTER — Ambulatory Visit: Payer: Medicare Other | Admitting: Family Medicine

## 2017-02-04 ENCOUNTER — Ambulatory Visit (INDEPENDENT_AMBULATORY_CARE_PROVIDER_SITE_OTHER): Payer: Medicare Other | Admitting: Family Medicine

## 2017-02-04 VITALS — BP 126/68 | HR 58 | Temp 98.2°F | Wt 113.0 lb

## 2017-02-04 DIAGNOSIS — N184 Chronic kidney disease, stage 4 (severe): Secondary | ICD-10-CM

## 2017-02-04 DIAGNOSIS — M79605 Pain in left leg: Secondary | ICD-10-CM

## 2017-02-04 NOTE — Progress Notes (Signed)
BP 126/68 (BP Location: Left Arm, Patient Position: Sitting, Cuff Size: Normal)   Pulse (!) 58   Temp 98.2 F (36.8 C) (Oral)   Wt 113 lb (51.3 kg)   SpO2 95%   BMI 20.02 kg/m    CC: left leg pain Subjective:    Patient ID: Nicole English, female    DOB: 17-Sep-1932, 84 y.o.   MRN: [id number]  HPI: Nicole English is a 84 y.o. female presenting on 02/04/2017 for Leg Pain (Pain starts in posterior left hip and radiates down lateral side of upper leg, then down posterior lower leg to the heel. Hip stays sore. Initially started from a fall last summer. Pain has gradually worsened.  Has tried Tylenol, no help.)   Confusing story recently (see recent phone notes).   Ongoing L leg pain from lateral hip to knee area - present for months. Lateral hip area stays warm and sore to touch. No fevers/chills, denies paresthesias, numbness or weakness, no bowel/bladder incontinence/accidents, saddle anesthesia. No buttock pain.  Denies inciting trauma/injury.  Tylenol doesn't help, tramadol can help.  Denies recent change in activity or repetitive exercise.   Hasn't recently seen nephrologist.   Relevant past medical, surgical, family and social history reviewed and updated as indicated. Interim medical history since our last visit reviewed. Allergies and medications reviewed and updated. Outpatient Medications Prior to Visit  Medication Sig Dispense Refill  . acetaminophen (TYLENOL) 500 MG tablet Take 1,000 mg by mouth 2 (two) times daily as needed (pain).     Marland Kitchen aspirin EC 81 MG tablet Take 81 mg by mouth daily.    . bisoprolol (ZEBETA) 5 MG tablet Take 1 tablet (5 mg total) by mouth daily. 30 tablet 1  . calcitRIOL (ROCALTROL) 0.25 MCG capsule Take 0.25 mcg by mouth 3 (three) times a week.     . furosemide (LASIX) 20 MG tablet Take 30 mg by mouth daily.    Marland Kitchen gabapentin (NEURONTIN) 100 MG capsule TAKE ONE CAPSULE TWICE A DAY 60 capsule 2  . Probiotic Product (VSL#3) CAPS TAKE 1 CAPSULE BY MOUTH  DAILY. 60 capsule 0  . traMADol (ULTRAM) 50 MG tablet TAKE 1 TABLET TWICE A DAY AS NEEDED 50 tablet 0  . cephALEXin (KEFLEX) 500 MG capsule Take 1 capsule (500 mg total) by mouth 2 (two) times daily. 14 capsule 0  . HYDROcodone-acetaminophen (NORCO) 7.5-325 MG tablet Take 1 tablet by mouth daily as needed.     No facility-administered medications prior to visit.      Per HPI unless specifically indicated in ROS section below Review of Systems     Objective:    BP 126/68 (BP Location: Left Arm, Patient Position: Sitting, Cuff Size: Normal)   Pulse (!) 58   Temp 98.2 F (36.8 C) (Oral)   Wt 113 lb (51.3 kg)   SpO2 95%   BMI 20.02 kg/m   Wt Readings from Last 3 Encounters:  02/04/17 113 lb (51.3 kg)  09/13/16 111 lb 8 oz (50.6 kg)  08/20/16 111 lb (50.3 kg)    Physical Exam  Constitutional: She is oriented to person, place, and time. She appears well-developed and well-nourished. No distress.  Musculoskeletal: Normal range of motion. She exhibits no edema.  No pain midline spine No paraspinous mm tenderness Neg SLR bilaterally. No pain with int/ext rotation at hip. Neg FABER. No pain at SIJ, + pain at L GTB and sciatic notch Tender to palpation lateral leg along ITB FROM at knees  Neurological: She is alert and oriented to person, place, and time.  5/5 strength BLE  Skin: Skin is warm and dry.  Nursing note and vitals reviewed.      Assessment & Plan:   Problem List Items Addressed This Visit    CKD (chronic kidney disease) stage 4, GFR 15-29 ml/min (HCC)   Left leg pain - Primary    Exam today consistent with trochanteric bursitis and ITB syndrome. Supportive care reviewed. provided with exercises from [facility] pt advisor. Avoid NSAIDs in CKD stage 4. If not improving, suggested fu with SM to discuss possible steroid injection          Follow up plan: Return if symptoms worsen or fail to improve.  Ria Bush, MD

## 2017-02-04 NOTE — Assessment & Plan Note (Signed)
Exam today consistent with trochanteric bursitis and ITB syndrome. Supportive care reviewed. provided with exercises from [facility] pt advisor. Avoid NSAIDs in CKD stage 4. If not improving, suggested fu with SM to discuss possible steroid injection

## 2017-02-04 NOTE — Patient Instructions (Signed)
I think you have a bursitis of the left hip. Treat with continued tylenol, tramadol for breakthrough pain, and ice or heat (whichever soothes better). Do exercises provided today. If no better, may return to see Dr Lorelei Pont our sports medicine doctor to evaluate for possible steroid injection.

## 2017-02-09 ENCOUNTER — Encounter: Payer: Medicare Other | Admitting: Family Medicine

## 2017-02-15 ENCOUNTER — Encounter: Payer: Medicare Other | Admitting: Family Medicine

## 2017-02-15 DIAGNOSIS — Z0289 Encounter for other administrative examinations: Secondary | ICD-10-CM

## 2017-02-21 DIAGNOSIS — N184 Chronic kidney disease, stage 4 (severe): Secondary | ICD-10-CM | POA: Diagnosis not present

## 2017-02-21 DIAGNOSIS — E875 Hyperkalemia: Secondary | ICD-10-CM | POA: Diagnosis not present

## 2017-02-21 DIAGNOSIS — N2581 Secondary hyperparathyroidism of renal origin: Secondary | ICD-10-CM | POA: Diagnosis not present

## 2017-02-21 DIAGNOSIS — I129 Hypertensive chronic kidney disease with stage 1 through stage 4 chronic kidney disease, or unspecified chronic kidney disease: Secondary | ICD-10-CM | POA: Diagnosis not present

## 2017-02-21 DIAGNOSIS — E872 Acidosis: Secondary | ICD-10-CM | POA: Diagnosis not present

## 2017-02-21 DIAGNOSIS — R809 Proteinuria, unspecified: Secondary | ICD-10-CM | POA: Diagnosis not present

## 2017-02-26 ENCOUNTER — Other Ambulatory Visit: Payer: Self-pay | Admitting: Family Medicine

## 2017-02-28 NOTE — Telephone Encounter (Signed)
Eprescribed.

## 2017-02-28 NOTE — Telephone Encounter (Signed)
Last filled:  01/28/17, #50 Last OV:  02/04/17 Next OV:  none

## 2017-03-11 NOTE — Progress Notes (Deleted)
[name] Date of Birth: March 01, 1932 Medical Record #[id number]  History of Present Illness: Nicole English is seen  for followup of HTN. She has a history of HTN, diastolic dysfunction, CKD, HLD, COPD - with severe airway obstruction on spirometry noted back in January of 2014, GERD, anxiety, depression, diverticulosis and CKD. Intolerant to ACE/ARB due to renal function. Intolerant to Norvasc due to edema. HCTZ has affected her renal indices. Intolerant to higher doses of hydralazine due to swelling and headache. Last Myoview at [facility] 04/2010 with no ischemia and EF of 90%. Echo 03/2012 with EF 60 to 66%, diastolic dysfunction, MAC. Has bilateral carotid disease of 0 to 39% per doppler in 04/2012. Negative event monitor in 03/2012.   On follow up today she states she is doing well and really has no complaints. Denies any increased edema, SOB, chest pain or dizziness. Stays active.    Current Outpatient Medications  Medication Sig Dispense Refill  . acetaminophen (TYLENOL) 500 MG tablet Take 1,000 mg by mouth 2 (two) times daily as needed (pain).     Marland Kitchen aspirin EC 81 MG tablet Take 81 mg by mouth daily.    . bisoprolol (ZEBETA) 5 MG tablet Take 1 tablet (5 mg total) by mouth daily. 30 tablet 1  . calcitRIOL (ROCALTROL) 0.25 MCG capsule Take 0.25 mcg by mouth 3 (three) times a week.     . furosemide (LASIX) 20 MG tablet Take 30 mg by mouth daily.    Marland Kitchen gabapentin (NEURONTIN) 100 MG capsule TAKE ONE CAPSULE TWICE A DAY 60 capsule 2  . Probiotic Product (VSL#3) CAPS TAKE 1 CAPSULE BY MOUTH DAILY. 60 capsule 0  . traMADol (ULTRAM) 50 MG tablet TAKE 1 TABLET BY MOUTH TWICE A DAY AS NEEDED 50 tablet 0   No current facility-administered medications for this visit.     Allergies  Allergen Reactions  . Amlodipine Other (See Comments)    edema  . Doxycycline Monohydrate Nausea And Vomiting  . Ramipril Other (See Comments)     Worsening renal function    Past Medical History:  Diagnosis Date  .  Abdominal aortic atherosclerosis ([place]) 07/2015   by CT  . Abnormal drug screen 12/2013   abnormally neg xanax (12/2013) and neg xanax and positive EtOH (04/2014), appropriate (08/2014)  . Allergic rhinitis   . Anxiety   . CKD (chronic kidney disease) stage 4, GFR 15-29 ml/min (HCC) 12/2012   per pt after brown recluse bite. sees Dr Juleen China, off ACEI, bp ok slightly elevated to maintain renal perfusion  . 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 DDD (degenerative disc disease), lumbar   . Depression   . Diastolic dysfunction 0630   per echo in 11/2010 and again 03/2012; normal EF  . Diverticulosis    colonsocopy 2002 aborted 2/2 tortuous colon and heavy sigmoid diverticulosis  . Dysphagia    EGD 2012 - wnl, s/p esoph dilation  . GERD (gastroesophageal reflux disease)   . Glucose intolerance (impaired glucose tolerance)   . History of CVA (cerebrovascular accident) 2012   old per prior head CT  . History of diverticulitis of colon   . HLD (hyperlipidemia)   . HTN (hypertension)    Dr. Martinique, cards  . IBS (irritable bowel syndrome)   . Migraine   . Osteoporosis 08/2013   Spine -2.2, hip -3.5  . Rib fractures 10/11/2014  . Right upper lobe pneumonia (  [place]) 03/25/2014  . SUI (stress urinary incontinence, female)    Dr. Amalia Hailey, urology (some urge as well)    Past Surgical History:  Procedure Laterality Date  . BREAST ENHANCEMENT SURGERY  1982  . COLONOSCOPY  2002   does not want another! severe diverticulosis with luminal narrowing and spasm Deatra Ina)  . CT HEAD LIMITED W/O CM  04/2010   nothing acute, ? old infarcts L internal capsule  . DEXA  08/2013   Spine -2.2, hip -3.5  . ESOPHAGOGASTRODUODENOSCOPY  2012   WNL, s/p esoph dilation (Dr. Candace Cruise, [facility])  . macroplastique implantation  03/2008   Dr. Rogers Blocker  . renal doppler  2009?   simple cysts, wnl  . submucous resection  1962  . TONSILLECTOMY  1942  . TOTAL  ABDOMINAL HYSTERECTOMY  1996   for frequent dysmennorhea  . TUBAL LIGATION  1973  . uretherolysis and removal of macroplastique  09/13/08   Dr. Amalia Hailey  . US ECHOCARDIOGRAPHY  11/2010   EF 55-60%, mild LAE, mild diastolic dysfunction, normal valves    Social History   Tobacco Use  Smoking Status Former Smoker  . Packs/day: 0.50  . Years: 54.00  . Pack years: 27.00  . Types: Cigarettes  . Start date: 01/18/1954  . Last attempt to quit: 01/19/2008  . Years since quitting: 9.1  Smokeless Tobacco Never Used    Social History   Substance and Sexual Activity  Alcohol Use Yes  . Alcohol/week: 0.0 oz   Comment: 1 1/2-2 glasses white wine/day    Family History  Problem Relation Age of Onset  . Heart failure Father   . Hypertension Father   . Coronary artery disease Father   . Prostate cancer Father   . Leukemia Mother        CLL prior  . Breast cancer Maternal Aunt   . Breast cancer Paternal Aunt   . Colon cancer Neg Hx   . Esophageal cancer Neg Hx   . Stomach cancer Neg Hx   . Pancreatic cancer Neg Hx   . Liver disease Neg Hx     Review of Systems: The review of systems is per the HPI.  All other systems were reviewed and are negative.  Physical Exam: There were no vitals taken for this visit.  Patient is very pleasant and in no acute distress.  Skin is warm and dry. Color is normal.  HEENT is unremarkable. Normocephalic/atraumatic. PERRL. Sclera are nonicteric. Neck is supple. No masses. No JVD. Lungs reveal few scattered wheezes.  Cardiac exam shows a regular rate and rhythm. No gallop or murmur. Abdomen is soft. Extremities are without edema. Gait and ROM are intact. No gross neurologic deficits noted.  LABORATORY DATA:   Lab Results  Component Value Date   WBC 8.7 08/20/2016   HGB 11.7 (L) 08/20/2016   HCT 37.2 08/20/2016   PLT 315 08/20/2016   GLUCOSE 106 (H) 09/13/2016   CHOL 164 03/08/2016   TRIG 138.0 03/08/2016   HDL 52.30 03/08/2016   LDLDIRECT 103.5  12/03/2010   LDLCALC 84 03/08/2016   ALT 8 (L) 08/20/2016   AST 16 08/20/2016   NA 137 09/13/2016   K 4.8 09/13/2016   CL 103 09/13/2016   CREATININE 2.40 (H) 09/13/2016   BUN 34 (H) 09/13/2016   CO2 27 09/13/2016   TSH 2.394 05/30/2016   INR 1.03 08/19/2012   HGBA1C 5.6 10/02/2008   MICROALBUR 31.3 (H) 03/08/2016     Ecg today shows sinus brady  with HR 57. Otherwise normal. I have personally reviewed and interpreted this study.   Echo 05/31/16:Study Conclusions  - Left ventricle: The cavity size was normal. Wall thickness was   normal. Systolic function was normal. The estimated ejection   fraction was in the range of 60% to 65%. Wall motion was normal;   there were no regional wall motion abnormalities. Features are   consistent with a pseudonormal left ventricular filling pattern,   with concomitant abnormal relaxation and increased filling   pressure (grade 2 diastolic dysfunction). - Aortic valve: Trileaflet; mildly thickened, mildly calcified   leaflets. - Mitral valve: Moderately calcified annulus.  Impressions:  - Compared to the prior study, there has been no significant   interval change.   Assessment / Plan:  1. Chronic diastolic dysfunction - well compensated based on weight and lack of swelling. We discussed the results of her lab work from yesterday showing increased creatinine from 2.2 >> 2.5. She is seeing her nephrologist next week. May consider reduction in lasix dose.  2. COPD  3. CKD -stage III. Creatinine 2.5.   4. HTN - BP is well controlled.    I will follow up in one year.

## 2017-03-12 ENCOUNTER — Other Ambulatory Visit: Payer: Self-pay | Admitting: Family Medicine

## 2017-03-14 NOTE — Telephone Encounter (Signed)
Last filled:  01/27/17, #60 Last OV:  02/04/17 Next OV:  none

## 2017-03-16 ENCOUNTER — Ambulatory Visit: Payer: Medicare Other | Admitting: Cardiology

## 2017-03-23 ENCOUNTER — Other Ambulatory Visit: Payer: Self-pay | Admitting: Family Medicine

## 2017-03-23 NOTE — Telephone Encounter (Signed)
Last filled:  03/01/17, #50 Last OV:  02/04/17 Next OV:  None

## 2017-03-24 NOTE — Telephone Encounter (Signed)
Eprescribed.

## 2017-04-06 ENCOUNTER — Telehealth: Payer: Self-pay | Admitting: Cardiology

## 2017-04-06 MED ORDER — NEBIVOLOL HCL 5 MG PO TABS: 5.0000 mg | ORAL_TABLET | Freq: Every day | ORAL | 6 refills | Status: DC

## 2017-04-06 NOTE — Telephone Encounter (Signed)
Returned call to patient of Dr. Martinique concerning her medication "little pink pill". She was told this medication (unknown) is not available. She does not have the pill bottle. Informed her I will call pharmacy for clarification since she is not sure of the medication.   [facility] - her bisoprolol 5mg  is on manufacturer back order. Will route to MD/[facility] for recommendations on alternative medication   She has an MD office visit on 04/25/17

## 2017-04-06 NOTE — Telephone Encounter (Signed)
Returned call to patient no answer.Unable to leave message no voice mail. 

## 2017-04-06 NOTE — Telephone Encounter (Signed)
Returned call to patient she stated Bisoprolol on back order pharmacist advised her to ask Dr.Jordan to prescribe alternative med.Advised I will send message to [place] for advice.

## 2017-04-06 NOTE — Telephone Encounter (Signed)
Returned call to patient Dr.Jordan's recommendation given.Stated she did not want samples.Bystolic prescription sent to pharmacy.

## 2017-04-06 NOTE — Telephone Encounter (Signed)
New message    Patient is calling to find out about her medication , the pharmacy said that they can not get it , she does not know the name of the medication its a tiny pink pill

## 2017-04-06 NOTE — Telephone Encounter (Signed)
Would switch to bystolic 5 mg daily. We can give her samples if we have them.  Peter Martinique MD, [facility]

## 2017-04-06 NOTE — Telephone Encounter (Signed)
Follow Up:    Pt calling to find out what new medicine was recommended?

## 2017-04-24 NOTE — Progress Notes (Signed)
[name] Date of Birth: 10-29-1932 Medical Record #[id number]  History of Present Illness: Nicole English is seen  for followup of HTN. She has a history of HTN, diastolic dysfunction, CKD, HLD, COPD - with severe airway obstruction on spirometry noted back in January of 2014, GERD, anxiety, depression, diverticulosis and CKD. Intolerant to ACE/ARB due to renal function. Intolerant to Norvasc due to edema. HCTZ has affected her renal indices. Intolerant to higher doses of hydralazine due to swelling and headache. Last Myoview at [facility] 04/2010 with no ischemia and EF of 90%. Echo 03/2012 with EF 60 to 95%, diastolic dysfunction, MAC. Unchanged in May 2018.  Has bilateral carotid disease of 0 to 39% per doppler in 04/2012. Negative event monitor in 03/2012.   On follow up today she states she is doing well. She has not been monitoring her BP at home. Recent UTI. She has been eating a lot of sodium. Denies any increase swelling or dyspnea. No chest pain.     Current Outpatient Medications  Medication Sig Dispense Refill  . acetaminophen (TYLENOL) 500 MG tablet Take 1,000 mg by mouth 2 (two) times daily as needed (pain).     Marland Kitchen aspirin EC 81 MG tablet Take 81 mg by mouth daily.    . calcitRIOL (ROCALTROL) 0.25 MCG capsule Take 0.25 mcg by mouth 3 (three) times a week.     . furosemide (LASIX) 20 MG tablet Take 30 mg by mouth daily.    Marland Kitchen gabapentin (NEURONTIN) 100 MG capsule TAKE ONE CAPSULE BY MOUTH TWICE A DAY 60 capsule 2  . nebivolol (BYSTOLIC) 5 MG tablet Take 1 tablet (5 mg total) by mouth daily. 30 tablet 6  . Probiotic Product (VSL#3) CAPS TAKE 1 CAPSULE BY MOUTH DAILY. 60 capsule 0  . traMADol (ULTRAM) 50 MG tablet TAKE 1 TABLET BY MOUTH TWICE A DAY AS NEEDED 50 tablet 0   No current facility-administered medications for this visit.     Allergies  Allergen Reactions  . Amlodipine Other (See Comments)    edema  . Doxycycline Monohydrate Nausea And Vomiting  . Ramipril Other (See  Comments)     Worsening renal function    Past Medical History:  Diagnosis Date  . Abdominal aortic atherosclerosis ([place]) 07/2015   by CT  . Abnormal drug screen 12/2013   abnormally neg xanax (12/2013) and neg xanax and positive EtOH (04/2014), appropriate (08/2014)  . Allergic rhinitis   . Anxiety   . CKD (chronic kidney disease) stage 4, GFR 15-29 ml/min (HCC) 12/2012   per pt after brown recluse bite. sees Dr Juleen China, off ACEI, bp ok slightly elevated to maintain renal perfusion  . 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 DDD (degenerative disc disease), lumbar   . Depression   . Diastolic dysfunction 1884   per echo in 11/2010 and again 03/2012; normal EF  . Diverticulosis    colonsocopy 2002 aborted 2/2 tortuous colon and heavy sigmoid diverticulosis  . Dysphagia    EGD 2012 - wnl, s/p esoph dilation  . GERD (gastroesophageal reflux disease)   . Glucose intolerance (impaired glucose tolerance)   . History of CVA (cerebrovascular accident) 2012   old per prior head CT  . History of diverticulitis of colon   . HLD (hyperlipidemia)   . HTN (hypertension)    Dr. Martinique, cards  . IBS (irritable bowel syndrome)   . Migraine   .  Osteoporosis 08/2013   Spine -2.2, hip -3.5  . Rib fractures 10/11/2014  . Right upper lobe pneumonia ([place]) 03/25/2014  . SUI (stress urinary incontinence, female)    Dr. Amalia Hailey, urology (some urge as well)    Past Surgical History:  Procedure Laterality Date  . BREAST ENHANCEMENT SURGERY  1982  . COLONOSCOPY  2002   does not want another! severe diverticulosis with luminal narrowing and spasm Deatra Ina)  . CT HEAD LIMITED W/O CM  04/2010   nothing acute, ? old infarcts L internal capsule  . DEXA  08/2013   Spine -2.2, hip -3.5  . ESOPHAGOGASTRODUODENOSCOPY  2012   WNL, s/p esoph dilation (Dr. Candace Cruise, [facility] Bryant)  . macroplastique implantation  03/2008   Dr. Rogers Blocker  . renal doppler   2009?   simple cysts, wnl  . submucous resection  1962  . TONSILLECTOMY  1942  . TOTAL ABDOMINAL HYSTERECTOMY  1996   for frequent dysmennorhea  . TUBAL LIGATION  1973  . uretherolysis and removal of macroplastique  09/13/08   Dr. Amalia Hailey  . US ECHOCARDIOGRAPHY  11/2010   EF 55-60%, mild LAE, mild diastolic dysfunction, normal valves    Social History   Tobacco Use  Smoking Status Former Smoker  . Packs/day: 0.50  . Years: 54.00  . Pack years: 27.00  . Types: Cigarettes  . Start date: 01/18/1954  . Last attempt to quit: 01/19/2008  . Years since quitting: 9.2  Smokeless Tobacco Never Used    Social History   Substance and Sexual Activity  Alcohol Use Yes  . Alcohol/week: 0.0 oz   Comment: 1 1/2-2 glasses white wine/day    Family History  Problem Relation Age of Onset  . Heart failure Father   . Hypertension Father   . Coronary artery disease Father   . Prostate cancer Father   . Leukemia Mother        CLL prior  . Breast cancer Maternal Aunt   . Breast cancer Paternal Aunt   . Colon cancer Neg Hx   . Esophageal cancer Neg Hx   . Stomach cancer Neg Hx   . Pancreatic cancer Neg Hx   . Liver disease Neg Hx     Review of Systems: The review of systems is per the HPI.  All other systems were reviewed and are negative.  Physical Exam: BP (!) 140/100 (BP Location: Left Arm, Patient Position: Sitting, Cuff Size: Normal)   Pulse 100   Ht _0  (1.549 m)   Wt 109 lb (49.4 kg)   BMI 20.60 kg/m   GENERAL:  Well appearing HEENT:  PERRL, EOMI, sclera are clear. Oropharynx is clear. NECK:  No jugular venous distention, carotid upstroke brisk and symmetric, no bruits, no thyromegaly or adenopathy LUNGS:  Clear to auscultation bilaterally CHEST:  Unremarkable HEART:  RRR,  PMI not displaced or sustained,S1 and S2 within normal limits, no S3, no S4: no clicks, no rubs, no murmurs ABD:  Soft, nontender. BS +, no masses or bruits. No hepatomegaly, no splenomegaly EXT:  2 +  pulses throughout, no edema, no cyanosis no clubbing SKIN:  Warm and dry.  No rashes NEURO:  Alert and oriented x 3. Cranial nerves II through XII intact. PSYCH:  Cognitively intact    LABORATORY DATA:   Lab Results  Component Value Date   WBC 8.7 08/20/2016   HGB 11.7 (L) 08/20/2016   HCT 37.2 08/20/2016   PLT 315 08/20/2016   GLUCOSE 106 (H) 09/13/2016  CHOL 164 03/08/2016   TRIG 138.0 03/08/2016   HDL 52.30 03/08/2016   LDLDIRECT 103.5 12/03/2010   LDLCALC 84 03/08/2016   ALT 8 (L) 08/20/2016   AST 16 08/20/2016   NA 137 09/13/2016   K 4.8 09/13/2016   CL 103 09/13/2016   CREATININE 2.40 (H) 09/13/2016   BUN 34 (H) 09/13/2016   CO2 27 09/13/2016   TSH 2.394 05/30/2016   INR 1.03 08/19/2012   HGBA1C 5.6 10/02/2008   MICROALBUR 31.3 (H) 03/08/2016   Dated 02/21/17: creatinine 2.61. Hgb 11.8.  Ecg today shows NSR with rate 100.  Otherwise normal. I have personally reviewed and interpreted this study.   Echo 05/31/16: Study Conclusions  - Left ventricle: The cavity size was normal. Wall thickness was   normal. Systolic function was normal. The estimated ejection   fraction was in the range of 60% to 65%. Wall motion was normal;   there were no regional wall motion abnormalities. Features are   consistent with a pseudonormal left ventricular filling pattern,   with concomitant abnormal relaxation and increased filling   pressure (grade 2 diastolic dysfunction). - Aortic valve: Trileaflet; mildly thickened, mildly calcified   leaflets. - Mitral valve: Moderately calcified annulus.  Impressions:  - Compared to the prior study, there has been no significant   interval change.  Assessment / Plan:  1. Chronic diastolic dysfunction - well compensated based on weight and lack of swelling. We discussed the importance of sodium restriction especially in the setting of CKD.  Continue lasix 30 mg daily  2. COPD  3. CKD -stage III. Creatinine 2.6.   4. HTN - BP is  elevated today. Recommend she take BP measurement at home. If it is staying high will need to increase Bystolic. Sodium restriction will help as well.     I will follow up in one year.

## 2017-04-25 ENCOUNTER — Encounter: Payer: Self-pay | Admitting: Cardiology

## 2017-04-25 ENCOUNTER — Ambulatory Visit (INDEPENDENT_AMBULATORY_CARE_PROVIDER_SITE_OTHER): Payer: Medicare Other | Admitting: Cardiology

## 2017-04-25 VITALS — BP 140/100 | HR 100 | Ht 61.0 in | Wt 109.0 lb

## 2017-04-25 DIAGNOSIS — E785 Hyperlipidemia, unspecified: Secondary | ICD-10-CM

## 2017-04-25 DIAGNOSIS — I5032 Chronic diastolic (congestive) heart failure: Secondary | ICD-10-CM

## 2017-04-25 DIAGNOSIS — I10 Essential (primary) hypertension: Secondary | ICD-10-CM | POA: Diagnosis not present

## 2017-04-25 NOTE — Patient Instructions (Addendum)
Restrict your salt intake  Monitor your blood pressure at home. Let us know if it stays elevated  I will see you in one year

## 2017-04-27 ENCOUNTER — Other Ambulatory Visit: Payer: Self-pay | Admitting: Family Medicine

## 2017-04-27 DIAGNOSIS — N2581 Secondary hyperparathyroidism of renal origin: Secondary | ICD-10-CM | POA: Diagnosis not present

## 2017-04-27 DIAGNOSIS — E875 Hyperkalemia: Secondary | ICD-10-CM | POA: Diagnosis not present

## 2017-04-27 DIAGNOSIS — N184 Chronic kidney disease, stage 4 (severe): Secondary | ICD-10-CM | POA: Diagnosis not present

## 2017-04-27 DIAGNOSIS — D631 Anemia in chronic kidney disease: Secondary | ICD-10-CM | POA: Diagnosis not present

## 2017-04-27 DIAGNOSIS — N390 Urinary tract infection, site not specified: Secondary | ICD-10-CM | POA: Diagnosis not present

## 2017-04-27 DIAGNOSIS — I5030 Unspecified diastolic (congestive) heart failure: Secondary | ICD-10-CM | POA: Diagnosis not present

## 2017-04-28 NOTE — Telephone Encounter (Signed)
Last filled:  03/24/17, #50 Last OV:  02/04/17 Next OV:  none

## 2017-04-29 ENCOUNTER — Other Ambulatory Visit: Payer: Self-pay | Admitting: Cardiology

## 2017-04-29 NOTE — Telephone Encounter (Signed)
E Prescribed

## 2017-05-02 ENCOUNTER — Other Ambulatory Visit: Payer: Self-pay | Admitting: Cardiology

## 2017-05-02 NOTE — Telephone Encounter (Signed)
Rx received from pharmacy for Bisoprolol 5 mg, after reviewed chart, it look like pt was switch from Bisoprolol to Bystolic because Bisoprolol medication was on back order, call and spoke with pt to let her know about the medications switch and see if she is taking Bystolic, pt stated she is not taking Bystolic, spoke with pharmacist which stated pt have not pick up the Bystolic that was send in, Bisoprolol 5 mg was reordered for pt to continue taking, because pt has not started the Bystolic.Marland Kitchen

## 2017-05-02 NOTE — Telephone Encounter (Signed)
°*  STAT* If patient is at the pharmacy, call can be transferred to refill team.   1. Which medications need to be refilled? (please list name of each medication and dose if known) New prescription for Bisoprolol  2. Which pharmacy/location (including street and city if local pharmacy) is medication to be sent to?[phone]  3. Do they need a 30 day or 90 day supply? 90 and refills

## 2017-05-03 NOTE — Telephone Encounter (Signed)
rx for bisoprolol was call into to pt pharmacy, pt never started the [facility]

## 2017-05-21 ENCOUNTER — Other Ambulatory Visit: Payer: Self-pay | Admitting: Family Medicine

## 2017-05-23 NOTE — Telephone Encounter (Signed)
Last filled 04-29-17 #50 Last OV 02-04-17 No Future OV  Forwarding to Dr Damita Dunnings in Dr Synthia Innocent absence

## 2017-05-23 NOTE — Telephone Encounter (Signed)
Sent. Thanks.   

## 2017-06-11 ENCOUNTER — Other Ambulatory Visit: Payer: Self-pay | Admitting: Gastroenterology

## 2017-06-14 ENCOUNTER — Ambulatory Visit: Payer: Self-pay | Admitting: *Deleted

## 2017-06-14 NOTE — Telephone Encounter (Signed)
Pt reports "fever" since Sunday. States has not checked with a thermometer but feels "hot." Reports body aches "Mostly legs."  Tylenol effective, but "Comes back." Pt states "It's obvious it's the flu." Has not been around anyone with the flu. Denies any other symptoms; no cough/cold symptoms, no headache sore throat or earache. Upon further assessment pt noted she does have burning with urination. Has not noted if malodorous or color of urine. Attempted to secure appt for pt; states "I can't come in this week."  Pt requested medication be called in for her "For the flu or this burning."  Alerted to the fact Dr.would most likely NOT do that, she stated she would try to get transportation and call back for appt.. Care advise given per protocol. Advised to try to obtain thermometer, check temp, stay hydrated, call back for appt.. Reason for Disposition . [1] Fever AND [2] no signs of serious infection or localizing symptoms (all other triage questions negative) . Age > 50 years  Answer Assessment - Initial Assessment Questions 1. TEMPERATURE: "What is the most recent temperature?"  "How was it measured?"      Not sure but "Hot' 2. ONSET: "When did the fever start?"      Sunday 3. SYMPTOMS: "Do you have any other symptoms besides the fever?"  (e.g., colds, headache, sore throat, earache, cough, rash, diarrhea, vomiting, abdominal pain)     Burning on urination 4. CAUSE: If there are no symptoms, ask: "What do you think is causing the fever?"      Flu 5. CONTACTS: "Does anyone else in the family have an infection?"     no 6. TREATMENT: "What have you done so far to treat this fever?" (e.g., medications)     Tylenol , "Feel better for a while" 7. IMMUNOCOMPROMISE: "Do you have of the following: diabetes, HIV positive, splenectomy, cancer chemotherapy, chronic steroid treatment, transplant patient, etc."  Answer Assessment - Initial Assessment Questions 1. SEVERITY: "How bad is the pain?"  (e.g.,  Scale 1-10; mild, moderate, or severe)   - MILD (1-3): complains slightly about urination hurting   - MODERATE (4-7): interferes with normal activities     - SEVERE (8-10): excruciating, unwilling or unable to urinate because of the pain      Mild-moderate 2. FREQUENCY: "How many times have you had painful urination today?"      Not noted 3. PATTERN: "Is pain present every time you urinate or just sometimes?"      Not sure 4. ONSET: "When did the painful urination start?"      Sunday 5. FEVER: "Do you have a fever?" If so, ask: "What is your temperature, how was it measured, and when did it start?"     "Feel hot." 6. PAST UTI: "Have you had a urine infection before?" If so, ask: "When was the last time?" and "What happened that time?"      Yes, have had burning at times. 7. CAUSE: "What do you think is causing the painful urination?"  (e.g., UTI, scratch, Herpes sore)     Not sure 8. OTHER SYMPTOMS: "Do you have any other symptoms?" (e.g., flank pain, vaginal discharge, genital sores, urgency, blood in urine)     none  Protocols used: FEVER-A-AH, URINATION PAIN - FEMALE-A-AH

## 2017-06-14 NOTE — Telephone Encounter (Signed)
Agree. Needs OV for eval.

## 2017-06-16 ENCOUNTER — Encounter (HOSPITAL_COMMUNITY): Payer: Self-pay | Admitting: Emergency Medicine

## 2017-06-16 ENCOUNTER — Emergency Department (HOSPITAL_COMMUNITY)
Admission: EM | Admit: 2017-06-16 | Discharge: 2017-06-16 | Disposition: A | Discharge status: Home / Self Care | Payer: Medicare Other | Attending: Emergency Medicine | Admitting: Emergency Medicine

## 2017-06-16 ENCOUNTER — Ambulatory Visit: Payer: Self-pay

## 2017-06-16 DIAGNOSIS — R51 Headache: Secondary | ICD-10-CM | POA: Diagnosis not present

## 2017-06-16 DIAGNOSIS — Z7982 Long term (current) use of aspirin: Secondary | ICD-10-CM | POA: Diagnosis not present

## 2017-06-16 DIAGNOSIS — R410 Disorientation, unspecified: Secondary | ICD-10-CM | POA: Diagnosis not present

## 2017-06-16 DIAGNOSIS — R42 Dizziness and giddiness: Secondary | ICD-10-CM | POA: Diagnosis not present

## 2017-06-16 DIAGNOSIS — J449 Chronic obstructive pulmonary disease, unspecified: Secondary | ICD-10-CM | POA: Insufficient documentation

## 2017-06-16 DIAGNOSIS — N184 Chronic kidney disease, stage 4 (severe): Secondary | ICD-10-CM | POA: Insufficient documentation

## 2017-06-16 DIAGNOSIS — Z8673 Personal history of transient ischemic attack (TIA), and cerebral infarction without residual deficits: Secondary | ICD-10-CM | POA: Insufficient documentation

## 2017-06-16 DIAGNOSIS — Z87891 Personal history of nicotine dependence: Secondary | ICD-10-CM | POA: Diagnosis not present

## 2017-06-16 DIAGNOSIS — R531 Weakness: Secondary | ICD-10-CM | POA: Insufficient documentation

## 2017-06-16 DIAGNOSIS — I129 Hypertensive chronic kidney disease with stage 1 through stage 4 chronic kidney disease, or unspecified chronic kidney disease: Secondary | ICD-10-CM | POA: Diagnosis not present

## 2017-06-16 DIAGNOSIS — R0902 Hypoxemia: Secondary | ICD-10-CM | POA: Diagnosis not present

## 2017-06-16 DIAGNOSIS — Z79899 Other long term (current) drug therapy: Secondary | ICD-10-CM | POA: Diagnosis not present

## 2017-06-16 DIAGNOSIS — R112 Nausea with vomiting, unspecified: Secondary | ICD-10-CM | POA: Diagnosis not present

## 2017-06-16 DIAGNOSIS — I10 Essential (primary) hypertension: Secondary | ICD-10-CM | POA: Diagnosis not present

## 2017-06-16 DIAGNOSIS — G4489 Other headache syndrome: Secondary | ICD-10-CM | POA: Diagnosis not present

## 2017-06-16 LAB — CBC
HCT: 42.8 % (ref 36.0–46.0)
Hemoglobin: 13.7 g/dL (ref 12.0–15.0)
MCH: 31.7 pg (ref 26.0–34.0)
MCHC: 32 g/dL (ref 30.0–36.0)
MCV: 99.1 fL (ref 78.0–100.0)
Platelets: 266 10*3/uL (ref 150–400)
RBC: 4.32 MIL/uL (ref 3.87–5.11)
RDW: 12.1 % (ref 11.5–15.5)
WBC: 6.9 10*3/uL (ref 4.0–10.5)

## 2017-06-16 LAB — BASIC METABOLIC PANEL
Anion gap: 12 (ref 5–15)
BUN: 25 mg/dL — ABNORMAL HIGH (ref 6–20)
CO2: 24 mmol/L (ref 22–32)
Calcium: 9.8 mg/dL (ref 8.9–10.3)
Chloride: 103 mmol/L (ref 101–111)
Creatinine, Ser: 2.5 mg/dL — ABNORMAL HIGH (ref 0.44–1.00)
GFR calc Af Amer: 19 mL/min — ABNORMAL LOW (ref >60)
GFR calc non Af Amer: 17 mL/min — ABNORMAL LOW (ref >60)
Glucose, Bld: 91 mg/dL (ref 65–99)
Potassium: 4.7 mmol/L (ref 3.5–5.1)
Sodium: 139 mmol/L (ref 135–145)

## 2017-06-16 LAB — URINALYSIS, ROUTINE W REFLEX MICROSCOPIC
Bacteria, UA: NONE SEEN
Bilirubin Urine: NEGATIVE
Glucose, UA: NEGATIVE mg/dL
Hgb urine dipstick: NEGATIVE
Ketones, ur: NEGATIVE mg/dL
Leukocytes, UA: NEGATIVE
Nitrite: NEGATIVE
Protein, ur: 100 mg/dL — AB
Specific Gravity, Urine: 1.006 (ref 1.005–1.030)
pH: 8 (ref 5.0–8.0)

## 2017-06-16 LAB — CBG MONITORING, ED: Glucose-Capillary: 81 mg/dL (ref 65–99)

## 2017-06-16 MED ORDER — ONDANSETRON HCL 4 MG PO TABS: 4.0000 mg | ORAL_TABLET | Freq: Four times a day (QID) | ORAL | 0 refills | Status: DC

## 2017-06-16 NOTE — Telephone Encounter (Signed)
I spoke with Hassell Done pts son and he has pt at [facility] on [address]. Hassell Done wonders if pt is dehydrated and also wonders if pt is taking too much med. Hassell Done wants to leave appt with Dr Darnell Level for 06/17/17 at this time. FYI to Dr Darnell Level (who is out of office this afternoon) and Dr Damita Dunnings.

## 2017-06-16 NOTE — ED Triage Notes (Signed)
Per EMS pt is from Urgent care.  She went there because she has had a headache x5 days and weakness x2 days.  At urgent care her CBG was 104 she was hypertensive in the 190s  They did a UA which was negative.  She states having Nausea and Vomiting 2 days ago currently denies both.  EMS states she is AOx4 but she was very slow with answers and they state she was forgetful.  NAD noted at this time.

## 2017-06-16 NOTE — Telephone Encounter (Signed)
Please check with them and reinforce prev dispo advice (to be seen tonight).  Thanks.

## 2017-06-16 NOTE — Telephone Encounter (Signed)
Pt called back to report continued fever, dry mouth, and body aches and vomiting. Pt c/o severe headache, poor appetite. Per the pt the  fever started 2 days ago. Pt was able to touch chest with chin. Denies rash, stiff neck. Pt does not have a thermometer.  Pt sounding very weak and like she was having a hard time finding the right word and a delay in answering a question. Pt unsure when her last void is. Advised to go to [facility] or ED. Pt is alone. Offered to call son Hassell Done [phone]). No answer on his cell phone but left message. Connected back to call with pt and son Hassell Done answered the phone. Updated son on pt's complaints and NT concern that pt may be dehydrated and needs to go to [facility] or ED ASAP. Son responded "this is the first time I heard of this and I'll handle it from here." Call was ended at that point.  Reason for Disposition . [1] Drinking very little AND [2] dehydration suspected (e.g., no urine > 12 hours, very dry mouth, very lightheaded)  Answer Assessment - Initial Assessment Questions 1. TEMPERATURE: "What is the most recent temperature?"  "How was it measured?"      No thermometer 2. ONSET: "When did the fever start?"       2 days ago 3. SYMPTOMS: "Do you have any other symptoms besides the fever?"  (e.g., colds, headache, sore throat, earache, cough, rash, diarrhea, vomiting, abdominal pain)     Headache - migraine vomiting this am, poor appetite,dehydrated  4. CAUSE: If there are no symptoms, ask: "What do you think is causing the fever?"      Pt doesn't know 5. CONTACTS: "Does anyone else in the family have an infection?"     no 6. TREATMENT: "What have you done so far to treat this fever?" (e.g., medications)     No tx 7. IMMUNOCOMPROMISE: "Do you have of the following: diabetes, HIV positive, splenectomy, cancer chemotherapy, chronic steroid treatment, transplant patient, etc."     n/a 8. PREGNANCY: "Is there any chance you are pregnant?" "When was your last menstrual  period?"     n/a 9. TRAVEL: "Have you traveled out of the country in the last month?" (e.g., travel history, exposures)     no  Protocols used: FEVER-A-AH

## 2017-06-16 NOTE — Telephone Encounter (Signed)
Thanks.  Noted.  Routed to PCP.

## 2017-06-17 ENCOUNTER — Ambulatory Visit: Payer: Medicare Other | Admitting: Family Medicine

## 2017-06-17 ENCOUNTER — Ambulatory Visit: Payer: Self-pay | Admitting: *Deleted

## 2017-06-17 DIAGNOSIS — R079 Chest pain, unspecified: Secondary | ICD-10-CM | POA: Diagnosis not present

## 2017-06-17 DIAGNOSIS — I10 Essential (primary) hypertension: Secondary | ICD-10-CM | POA: Diagnosis not present

## 2017-06-17 DIAGNOSIS — R410 Disorientation, unspecified: Secondary | ICD-10-CM | POA: Diagnosis not present

## 2017-06-17 DIAGNOSIS — Z0289 Encounter for other administrative examinations: Secondary | ICD-10-CM

## 2017-06-17 NOTE — Telephone Encounter (Signed)
I spoke with pt and today pt has h/a but not hurting as bad today due to taking migraine med,pt said she still has fever but has not taken temp due to thermometer being broken. pts head feels  Heavy. pthas been able to eat a bit of food today and has drank OJ. No N&V and no CP. Pt said she did not keep appt today with Dr Darnell Level because did not have a way to appt and later in conversation pt said she did not feel like coming to appt.? Pt said she only had transportation today; advised no more appts today at [facility] and would need to go to [facility] or ED but if pt wanted appt next wk I could schedule. Pt said she would not have a way next wk and finally said she would go to [facility] ED. I spoke with Hassell Done, pts son (DPR signed) and he said he had just gotten off the phone with the pt and was on his way to her house to talk with her; he said did not leave ED until 12:30 this morning and he thinks she is calling and wanting appts for attention; she did not have any fever at ED last night. Hassell Done said when he talks with pt and if she insist on going to ED she will go by ambulance. Hassell Done also said pt is going to [facility] next week. Hassell Done said the facility has independent living but also has more structured care if needed. FYI to Dr Darnell Level.

## 2017-06-17 NOTE — Telephone Encounter (Signed)
DUPLICATE ENCOUNTER  DISREGARD

## 2017-06-17 NOTE — Telephone Encounter (Signed)
Patient was seen at an urgent care yesterday  and was seen seen last night - had an appointment with Dr Danise [name]  today and was unable to make it .She has been eating today  Small amounts  Taking liquids well . Feels like she has a fever but has no thermometer.Pt was  advised that she needs to be evaluated today. No availability at this time at [facility] . Spoke with Rena at [facility] who spoke with Dr Danise [name] and pt was advised to be seen tonight at an Urgent Care or Er. Pt advised to go to [facility] er as  An option.

## 2017-06-17 NOTE — Telephone Encounter (Signed)
  Answer Assessment - Initial Assessment Questions 1. LOCATION: "Where does it hurt?"        Headache body aches     2. ONSET: "When did the headache start?" (Minutes, hours or days)         Yesterday   3. PATTERN: "Does the pain come and go, or has it been constant since it started?"        Medication took care of it temporaririly   4. SEVERITY: "How bad is the pain?" and "What does it keep you from doing?"  (e.g., Scale 1-10; mild, moderate, or severe)   - MILD (1-3): doesn't interfere with normal activities    - MODERATE (4-7): interferes with normal activities or awakens from sleep    - SEVERE (8-10): excruciating pain, unable to do any normal activities            3   5. RECURRENT SYMPTOM: "Have you ever had headaches before?" If so, ask: "When was the last time?" and "What happened that time?"        Yes   It has  Been a  While   6. CAUSE: "What do you think is causing the headache?"         Fever  7. MIGRAINE: "Have you been diagnosed with migraine headaches?" If so, ask: "Is this headache similar?"         Has  Had  Migraine but never with a  Fever  8. HEAD INJURY: "Has there been any recent injury to the head?"        Not lately   9. OTHER SYMPTOMS: "Do you have any other symptoms?" (fever, stiff neck, eye pain, sore throat, cold symptoms)       Fever  Body aches  Sweating   10. PREGNANCY: "Is there any chance you are pregnant?" "When was your last menstrual period?" n/a  Protocols used: HEADACHE-A-AH

## 2017-06-18 NOTE — ED Provider Notes (Signed)
[place] EMERGENCY DEPARTMENT Provider Note   CSN: [id number] Arrival date & time: 06/16/17  1916     History   Chief Complaint Chief Complaint  Patient presents with  . Weakness    HPI Nicole English is a 84 y.o. female.  HPI   84 year old female sent from urgent care.  She initially presented there she been having headaches for the past several days felt generally weak for the past 2 days.  They noted blood pressure in the 190s she is to come the emergency room.  She is also been having some nausea and vomiting intermittently for the past few days.  Denies any abdominal pain.  No fevers or chills.  No urinary complaints.  No cough or shortness of breath. Past Medical History:  Diagnosis Date  . Abdominal aortic atherosclerosis ([place]) 07/2015   by CT  . Abnormal drug screen 12/2013   abnormally neg xanax (12/2013) and neg xanax and positive EtOH (04/2014), appropriate (08/2014)  . Allergic rhinitis   . Anxiety   . CKD (chronic kidney disease) stage 4, GFR 15-29 ml/min (HCC) 12/2012   per pt after brown recluse bite. sees Dr Juleen China, off ACEI, bp ok slightly elevated to maintain renal perfusion  . 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 DDD (degenerative disc disease), lumbar   . Depression   . Diastolic dysfunction 0932   per echo in 11/2010 and again 03/2012; normal EF  . Diverticulosis    colonsocopy 2002 aborted 2/2 tortuous colon and heavy sigmoid diverticulosis  . Dysphagia    EGD 2012 - wnl, s/p esoph dilation  . GERD (gastroesophageal reflux disease)   . Glucose intolerance (impaired glucose tolerance)   . History of CVA (cerebrovascular accident) 2012   old per prior head CT  . History of diverticulitis of colon   . HLD (hyperlipidemia)   . HTN (hypertension)    Dr. Martinique, cards  . IBS (irritable bowel syndrome)   . Migraine   . Osteoporosis 08/2013   Spine  -2.2, hip -3.5  . Rib fractures 10/11/2014  . Right upper lobe pneumonia ([place]) 03/25/2014  . SUI (stress urinary incontinence, female)    Dr. Amalia Hailey, urology (some urge as well)    Patient Active Problem List   Diagnosis Date Noted  . Rectal pain 09/13/2016  . Cognitive decline 07/01/2016  . Uncontrolled hypertension 05/30/2016  . Sinus bradycardia 05/30/2016  . Left leg pain 04/02/2016  . Secondary hyperparathyroidism of renal origin ([place]) 03/15/2016  . Abdominal aortic atherosclerosis ([place]) 07/19/2015  . Stool incontinence 06/26/2015  . Urinary incontinence 05/21/2015  . Rib pain on right side 05/21/2015  . Advanced care planning/counseling discussion 03/07/2015  . Paresthesias 09/04/2014  . Alcohol use 09/04/2014  . Ruptured silicone breast implant 05/02/2014  . Hyponatremia 03/25/2014  . Diarrhea 12/12/2013  . Chest pain 03/26/2013  . Bradycardia 08/18/2012  . Syncope and collapse 04/06/2012  . Chronic back pain 03/29/2012  . COPD GOLD III 10/26/2011  . Diastolic dysfunction   . Medicare annual wellness visit, subsequent 12/09/2010  . Dysphagia 07/10/2010  . History of diverticulitis of colon   . HLD (hyperlipidemia)   . Anxiety   . MDD (major depressive disorder) ([place])   . GERD (gastroesophageal reflux disease)   . IBS (irritable bowel syndrome)   . Allergic rhinitis   . Migraine   . HTN (hypertension)   . SUI (  stress urinary incontinence, female)   . History of CVA (cerebrovascular accident) 01/18/2010  . CKD (chronic kidney disease) stage 4, GFR 15-29 ml/min (HCC) 12/01/2009  . Vitamin D deficiency 08/21/2008  . Anemia in chronic kidney disease 08/21/2008  . Osteoporosis 05/19/2007  . Insomnia 05/19/2007    Past Surgical History:  Procedure Laterality Date  . BREAST ENHANCEMENT SURGERY  1982  . COLONOSCOPY  2002   does not want another! severe diverticulosis with luminal narrowing and spasm Deatra Ina)  . CT HEAD LIMITED W/O CM  04/2010   nothing acute, ? old  infarcts L internal capsule  . DEXA  08/2013   Spine -2.2, hip -3.5  . ESOPHAGOGASTRODUODENOSCOPY  2012   WNL, s/p esoph dilation (Dr. Candace Cruise, [facility])  . macroplastique implantation  03/2008   Dr. Rogers Blocker  . renal doppler  2009?   simple cysts, wnl  . submucous resection  1962  . TONSILLECTOMY  1942  . TOTAL ABDOMINAL HYSTERECTOMY  1996   for frequent dysmennorhea  . TUBAL LIGATION  1973  . uretherolysis and removal of macroplastique  09/13/08   Dr. Amalia Hailey  . US ECHOCARDIOGRAPHY  11/2010   EF 55-60%, mild LAE, mild diastolic dysfunction, normal valves     OB History   None      Home Medications    Prior to Admission medications   Medication Sig Start Date End Date Taking? Authorizing Provider  acetaminophen (TYLENOL) 500 MG tablet Take 1,000 mg by mouth 2 (two) times daily as needed (pain).     [provider]  aspirin EC 81 MG tablet Take 81 mg by mouth daily.    [provider]  bisoprolol (ZEBETA) 5 MG tablet TAKE 1 TABLET BY MOUTH ONCE DAILY 05/02/17   Martinique, Peter M, MD  calcitRIOL (ROCALTROL) 0.25 MCG capsule Take 0.25 mcg by mouth 3 (three) times a week.     [provider]  furosemide (LASIX) 20 MG tablet Take 30 mg by mouth daily. 07/08/16   [provider]  gabapentin (NEURONTIN) 100 MG capsule TAKE ONE CAPSULE BY MOUTH TWICE A DAY 03/15/17   Ria Bush, MD  ondansetron (ZOFRAN) 4 MG tablet Take 1 tablet (4 mg total) by mouth every 6 (six) hours. 06/16/17   Virgel Manifold, MD  Probiotic Product (VSL#3) CAPS TAKE 1 CAPSULE BY MOUTH DAILY. 06/14/17   Mauri Pole, MD  traMADol (ULTRAM) 50 MG tablet TAKE 1 TABLET BY MOUTH TWICE A DAY AS NEEDED 05/23/17   Tonia Ghent, MD    Family History Family History  Problem Relation Age of Onset  . Heart failure Father   . Hypertension Father   . Coronary artery disease Father   . Prostate cancer Father   . Leukemia Mother        CLL prior  . Breast cancer Maternal Aunt   . Breast  cancer Paternal Aunt   . Colon cancer Neg Hx   . Esophageal cancer Neg Hx   . Stomach cancer Neg Hx   . Pancreatic cancer Neg Hx   . Liver disease Neg Hx     Social History Social History   Tobacco Use  . Smoking status: Former Smoker    Packs/day: 0.50    Years: 54.00    Pack years: 27.00    Types: Cigarettes    Start date: 01/18/1954    Last attempt to quit: 01/19/2008    Years since quitting: 9.4  . Smokeless tobacco: Never Used  Substance Use  Topics  . Alcohol use: Yes    Alcohol/week: 0.0 oz    Comment: 1 1/2-2 glasses white wine/day  . Drug use: No     Allergies   Amlodipine; Doxycycline monohydrate; and Ramipril   Review of Systems Review of Systems  All systems reviewed and negative, other than as noted in HPI.  Physical Exam Updated Vital Signs BP (!) 164/85   Pulse 90   Temp 99.6 F (37.6 C) (Oral)   Resp 19   Ht 5\' 1"  (1.549 m)   Wt 49 kg (108 lb)   SpO2 99%   BMI 20.41 kg/m   Physical Exam  Constitutional: She is oriented to person, place, and time. She appears well-developed and well-nourished. No distress.  HENT:  Head: Normocephalic and atraumatic.  Eyes: Conjunctivae are normal. Right eye exhibits no discharge. Left eye exhibits no discharge.  Neck: Neck supple.  Cardiovascular: Normal rate, regular rhythm and normal heart sounds. Exam reveals no gallop and no friction rub.  No murmur heard. Pulmonary/Chest: Effort normal and breath sounds normal. No respiratory distress.  Abdominal: Soft. She exhibits no distension. There is no tenderness.  Musculoskeletal: She exhibits no edema or tenderness.  Neurological: She is alert and oriented to person, place, and time.  Clear.  Content appropriate.  Follows commands.  Cranial nerves II through XII intact.  Strength 5 out of 5 bilateral upper and lower extremities.  Skin: Skin is warm and dry.  Psychiatric: She has a normal mood and affect. Her behavior is normal. Thought content normal.  Nursing  note and vitals reviewed.    ED Treatments / Results  Labs (all labs ordered are listed, but only abnormal results are displayed) Labs Reviewed  BASIC METABOLIC PANEL - Abnormal; Notable for the following components:      Result Value   BUN 25 (*)    Creatinine, Ser 2.50 (*)    GFR calc non Af Amer 17 (*)    GFR calc Af Amer 19 (*)    All other components within normal limits  URINALYSIS, ROUTINE W REFLEX MICROSCOPIC - Abnormal; Notable for the following components:   Color, Urine STRAW (*)    Protein, ur 100 (*)    All other components within normal limits  CBC  CBG MONITORING, ED    EKG EKG Interpretation  Date/Time:  Thursday Jun 16 2017 20:00:54 EDT Ventricular Rate:  86 PR Interval:    QRS Duration: 86 QT Interval:  350 QTC Calculation: 419 R Axis:   62 Text Interpretation:  Sinus rhythm Supraventricular bigeminy Low voltage, precordial leads Confirmed by Sherwood Gambler [phone]) on 06/17/2017 4:26:47 PM   Radiology No results found.  Procedures Procedures (including critical care time)  Medications Ordered in ED Medications - No data to display   Initial Impression / Assessment and Plan / ED Course  I have reviewed the triage vital signs and the nursing notes.  Pertinent labs & imaging results that were available during my care of the patient were reviewed by me and considered in my medical decision making (see chart for details).     84 year old female with generalized weakness.  Nothing focal on exam.  She is afebrile.  She does not be UTI.  She is not significantly anemic.  Not sure as to exact etiology, but I doubt emergent process.  Renal function appears to be close to baseline. Final Clinical Impressions(s) / ED Diagnoses   Final diagnoses:  Weakness    ED Discharge Orders  Ordered    ondansetron (ZOFRAN) 4 MG tablet  Every 6 hours     06/16/17 2251       Virgel Manifold, MD 06/18/17 2210

## 2017-06-20 ENCOUNTER — Emergency Department (HOSPITAL_COMMUNITY)
Admission: EM | Admit: 2017-06-20 | Discharge: 2017-06-20 | Disposition: A | Discharge status: Home / Self Care | Payer: Medicare Other | Attending: Emergency Medicine | Admitting: Emergency Medicine

## 2017-06-20 ENCOUNTER — Other Ambulatory Visit: Payer: Self-pay

## 2017-06-20 ENCOUNTER — Emergency Department (HOSPITAL_COMMUNITY): Payer: Medicare Other

## 2017-06-20 ENCOUNTER — Encounter (HOSPITAL_COMMUNITY): Payer: Self-pay

## 2017-06-20 DIAGNOSIS — I130 Hypertensive heart and chronic kidney disease with heart failure and stage 1 through stage 4 chronic kidney disease, or unspecified chronic kidney disease: Secondary | ICD-10-CM | POA: Diagnosis not present

## 2017-06-20 DIAGNOSIS — I5032 Chronic diastolic (congestive) heart failure: Secondary | ICD-10-CM | POA: Diagnosis not present

## 2017-06-20 DIAGNOSIS — N184 Chronic kidney disease, stage 4 (severe): Secondary | ICD-10-CM | POA: Diagnosis not present

## 2017-06-20 DIAGNOSIS — I10 Essential (primary) hypertension: Secondary | ICD-10-CM

## 2017-06-20 DIAGNOSIS — R0789 Other chest pain: Secondary | ICD-10-CM | POA: Insufficient documentation

## 2017-06-20 DIAGNOSIS — Z79899 Other long term (current) drug therapy: Secondary | ICD-10-CM | POA: Diagnosis not present

## 2017-06-20 DIAGNOSIS — R5381 Other malaise: Secondary | ICD-10-CM | POA: Insufficient documentation

## 2017-06-20 DIAGNOSIS — Z87891 Personal history of nicotine dependence: Secondary | ICD-10-CM | POA: Diagnosis not present

## 2017-06-20 DIAGNOSIS — J449 Chronic obstructive pulmonary disease, unspecified: Secondary | ICD-10-CM | POA: Diagnosis not present

## 2017-06-20 DIAGNOSIS — Z7982 Long term (current) use of aspirin: Secondary | ICD-10-CM | POA: Diagnosis not present

## 2017-06-20 DIAGNOSIS — R200 Anesthesia of skin: Secondary | ICD-10-CM | POA: Diagnosis not present

## 2017-06-20 LAB — COMPREHENSIVE METABOLIC PANEL
ALT: 18 U/L (ref 14–54)
AST: 22 U/L (ref 15–41)
Albumin: 3.6 g/dL (ref 3.5–5.0)
Alkaline Phosphatase: 72 U/L (ref 38–126)
Anion gap: 11 (ref 5–15)
BUN: 28 mg/dL — ABNORMAL HIGH (ref 6–20)
CO2: 24 mmol/L (ref 22–32)
CREATININE: 2.79 mg/dL — AB (ref 0.44–1.00)
Calcium: 9.2 mg/dL (ref 8.9–10.3)
Chloride: 101 mmol/L (ref 101–111)
GFR calc non Af Amer: 15 mL/min — ABNORMAL LOW (ref >60)
GFR, EST AFRICAN AMERICAN: 17 mL/min — AB (ref >60)
Glucose, Bld: 99 mg/dL (ref 65–99)
Potassium: 4.1 mmol/L (ref 3.5–5.1)
SODIUM: 136 mmol/L (ref 135–145)
Total Bilirubin: 0.5 mg/dL (ref 0.3–1.2)
Total Protein: 6.4 g/dL — ABNORMAL LOW (ref 6.5–8.1)

## 2017-06-20 LAB — I-STAT CHEM 8, ED
BUN: 35 mg/dL — ABNORMAL HIGH (ref 6–20)
CHLORIDE: 98 mmol/L — AB (ref 101–111)
Calcium, Ion: 1.22 mmol/L (ref 1.15–1.40)
Creatinine, Ser: 2.5 mg/dL — ABNORMAL HIGH (ref 0.44–1.00)
GLUCOSE: 96 mg/dL (ref 65–99)
HCT: 37 % (ref 36.0–46.0)
Hemoglobin: 12.6 g/dL (ref 12.0–15.0)
POTASSIUM: 4.2 mmol/L (ref 3.5–5.1)
Sodium: 135 mmol/L (ref 135–145)
TCO2: 26 mmol/L (ref 22–32)

## 2017-06-20 LAB — DIFFERENTIAL
ABS IMMATURE GRANULOCYTES: 0 10*3/uL (ref 0.0–0.1)
BASOS ABS: 0.1 10*3/uL (ref 0.0–0.1)
BASOS PCT: 1 %
Eosinophils Absolute: 0.1 10*3/uL (ref 0.0–0.7)
Eosinophils Relative: 2 %
Immature Granulocytes: 0 %
Lymphocytes Relative: 29 %
Lymphs Abs: 1.7 10*3/uL (ref 0.7–4.0)
MONO ABS: 0.7 10*3/uL (ref 0.1–1.0)
Monocytes Relative: 12 %
NEUTROS ABS: 3.2 10*3/uL (ref 1.7–7.7)
NEUTROS PCT: 56 %

## 2017-06-20 LAB — CBC
HCT: 37.7 % (ref 36.0–46.0)
HEMOGLOBIN: 11.9 g/dL — AB (ref 12.0–15.0)
MCH: 32.1 pg (ref 26.0–34.0)
MCHC: 31.6 g/dL (ref 30.0–36.0)
MCV: 101.6 fL — ABNORMAL HIGH (ref 78.0–100.0)
Platelets: 284 10*3/uL (ref 150–400)
RBC: 3.71 MIL/uL — AB (ref 3.87–5.11)
RDW: 12.3 % (ref 11.5–15.5)
WBC: 5.7 10*3/uL (ref 4.0–10.5)

## 2017-06-20 LAB — APTT: APTT: 38 s — AB (ref 24–36)

## 2017-06-20 LAB — PROTIME-INR
INR: 1.02
Prothrombin Time: 13.4 seconds (ref 11.4–15.2)

## 2017-06-20 LAB — I-STAT TROPONIN, ED: Troponin i, poc: 0 ng/mL (ref 0.00–0.08)

## 2017-06-20 MED ORDER — HYDROCHLOROTHIAZIDE 12.5 MG PO CAPS
12.5000 mg | ORAL_CAPSULE | Freq: Every day | ORAL | Status: DC
  Administered 2017-06-20: 12.5 mg via ORAL
  Filled 2017-06-20: qty 1

## 2017-06-20 NOTE — ED Triage Notes (Signed)
Pt presents for evaluation of R sided numbness to face arm and leg. States she woke up with heaviness and numbness this morning. No focal weakness.

## 2017-06-20 NOTE — Discharge Instructions (Signed)
Follow the Dash eating plan.  Also try to eat some foods containing potassium to avoid low potassium levels.  We are treating you with a diuretic, or fluid pill, to help control your blood pressure.  This will likely make you urinate more often.  Make sure you follow-up with your doctor for further evaluation and treatment as soon as possible.

## 2017-06-20 NOTE — ED Provider Notes (Signed)
[facility] EMERGENCY DEPARTMENT Provider Note   CSN: [id number] Arrival date & time: 06/20/17  1209     History   Chief Complaint Chief Complaint  Patient presents with  . Numbness    HPI Nicole English is a 84 y.o. female.  HPI   She presents for evaluation of "stiffness right arm and leg," which she noticed when she awoke this morning.  Immediately after noticing that she was able to get up and walk then ate breakfast without trouble.  She has difficulty describing what the stiffness is like, and at one point stated that it was painful.  She missed her PCP appointment on 06/17/2017 for unclear reasons.  She was evaluated here the day before that for vague weakness, and discharged.  She lives with her son who is currently at work.  She denies current headache, neck pain or back pain.  She has pain in her right anterior chest.  She denies fall or other trauma.  She denies fever, chills, cough, shortness of breath, nausea, vomiting, focal weakness or paresthesia.  She is taking her usual prescribed medications.  Patient has difficulty answering some questions and seems to have trouble remembering recent events.  She could not recall why she was evaluated in the ED on 06/16/2017.  There are no other known modifying factors.  Past Medical History:  Diagnosis Date  . Abdominal aortic atherosclerosis ([place]) 07/2015   by CT  . Abnormal drug screen 12/2013   abnormally neg xanax (12/2013) and neg xanax and positive EtOH (04/2014), appropriate (08/2014)  . Allergic rhinitis   . Anxiety   . CKD (chronic kidney disease) stage 4, GFR 15-29 ml/min (HCC) 12/2012   per pt after brown recluse bite. sees Dr Juleen China, off ACEI, bp ok slightly elevated to maintain renal perfusion  . 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 DDD (degenerative disc disease), lumbar   . Depression   . Diastolic dysfunction 8341   per echo in 11/2010 and again 03/2012; normal EF  . Diverticulosis    colonsocopy 2002 aborted 2/2 tortuous colon and heavy sigmoid diverticulosis  . Dysphagia    EGD 2012 - wnl, s/p esoph dilation  . GERD (gastroesophageal reflux disease)   . Glucose intolerance (impaired glucose tolerance)   . History of CVA (cerebrovascular accident) 2012   old per prior head CT  . History of diverticulitis of colon   . HLD (hyperlipidemia)   . HTN (hypertension)    Dr. Martinique, cards  . IBS (irritable bowel syndrome)   . Migraine   . Osteoporosis 08/2013   Spine -2.2, hip -3.5  . Rib fractures 10/11/2014  . Right upper lobe pneumonia ([place]) 03/25/2014  . SUI (stress urinary incontinence, female)    Dr. Amalia Hailey, urology (some urge as well)    Patient Active Problem List   Diagnosis Date Noted  . Rectal pain 09/13/2016  . Cognitive decline 07/01/2016  . Uncontrolled hypertension 05/30/2016  . Sinus bradycardia 05/30/2016  . Left leg pain 04/02/2016  . Secondary hyperparathyroidism of renal origin ([place]) 03/15/2016  . Abdominal aortic atherosclerosis ([place]) 07/19/2015  . Stool incontinence 06/26/2015  . Urinary incontinence 05/21/2015  . Rib pain on right side 05/21/2015  . Advanced care planning/counseling discussion 03/07/2015  . Paresthesias 09/04/2014  . Alcohol use 09/04/2014  . Ruptured silicone breast implant 05/02/2014  . Hyponatremia 03/25/2014  . Diarrhea 12/12/2013  . Chest  pain 03/26/2013  . Bradycardia 08/18/2012  . Syncope and collapse 04/06/2012  . Chronic back pain 03/29/2012  . COPD GOLD III 10/26/2011  . Diastolic dysfunction   . Medicare annual wellness visit, subsequent 12/09/2010  . Dysphagia 07/10/2010  . History of diverticulitis of colon   . HLD (hyperlipidemia)   . Anxiety   . MDD (major depressive disorder) ([place])   . GERD (gastroesophageal reflux disease)   . IBS (irritable bowel syndrome)   . Allergic rhinitis   . Migraine   . HTN (hypertension)   . SUI  (stress urinary incontinence, female)   . History of CVA (cerebrovascular accident) 01/18/2010  . CKD (chronic kidney disease) stage 4, GFR 15-29 ml/min (HCC) 12/01/2009  . Vitamin D deficiency 08/21/2008  . Anemia in chronic kidney disease 08/21/2008  . Osteoporosis 05/19/2007  . Insomnia 05/19/2007    Past Surgical History:  Procedure Laterality Date  . BREAST ENHANCEMENT SURGERY  1982  . COLONOSCOPY  2002   does not want another! severe diverticulosis with luminal narrowing and spasm Deatra Ina)  . CT HEAD LIMITED W/O CM  04/2010   nothing acute, ? old infarcts L internal capsule  . DEXA  08/2013   Spine -2.2, hip -3.5  . ESOPHAGOGASTRODUODENOSCOPY  2012   WNL, s/p esoph dilation (Dr. Candace Cruise, [facility])  . macroplastique implantation  03/2008   Dr. Rogers Blocker  . renal doppler  2009?   simple cysts, wnl  . submucous resection  1962  . TONSILLECTOMY  1942  . TOTAL ABDOMINAL HYSTERECTOMY  1996   for frequent dysmennorhea  . TUBAL LIGATION  1973  . uretherolysis and removal of macroplastique  09/13/08   Dr. Amalia Hailey  . US ECHOCARDIOGRAPHY  11/2010   EF 55-60%, mild LAE, mild diastolic dysfunction, normal valves     OB History   None      Home Medications    Prior to Admission medications   Medication Sig Start Date End Date Taking? Authorizing Provider  acetaminophen (TYLENOL) 500 MG tablet Take 1,000 mg by mouth 2 (two) times daily as needed (pain).    Yes [provider]  aspirin EC 81 MG tablet Take 81 mg by mouth daily.   Yes [provider]  calcitRIOL (ROCALTROL) 0.25 MCG capsule Take 0.25 mcg by mouth 3 (three) times a week.    Yes [provider]  furosemide (LASIX) 20 MG tablet Take 30 mg by mouth daily. 07/08/16  Yes [provider]  gabapentin (NEURONTIN) 100 MG capsule TAKE ONE CAPSULE BY MOUTH TWICE A DAY 03/15/17  Yes Ria Bush, MD  Probiotic Product (VSL#3) CAPS TAKE 1 CAPSULE BY MOUTH DAILY. 06/14/17  Yes Nandigam, Venia Minks, MD    traMADol (ULTRAM) 50 MG tablet TAKE 1 TABLET BY MOUTH TWICE A DAY AS NEEDED 05/23/17  Yes Tonia Ghent, MD  bisoprolol (ZEBETA) 5 MG tablet TAKE 1 TABLET BY MOUTH ONCE DAILY 05/02/17   Martinique, Peter M, MD  ondansetron (ZOFRAN) 4 MG tablet Take 1 tablet (4 mg total) by mouth every 6 (six) hours. Patient not taking: Reported on 06/20/2017 06/16/17   Virgel Manifold, MD    Family History Family History  Problem Relation Age of Onset  . Heart failure Father   . Hypertension Father   . Coronary artery disease Father   . Prostate cancer Father   . Leukemia Mother        CLL prior  . Breast cancer Maternal Aunt   . Breast cancer Paternal Aunt   .  Colon cancer Neg Hx   . Esophageal cancer Neg Hx   . Stomach cancer Neg Hx   . Pancreatic cancer Neg Hx   . Liver disease Neg Hx     Social History Social History   Tobacco Use  . Smoking status: Former Smoker    Packs/day: 0.50    Years: 54.00    Pack years: 27.00    Types: Cigarettes    Start date: 01/18/1954    Last attempt to quit: 01/19/2008    Years since quitting: 9.4  . Smokeless tobacco: Never Used  Substance Use Topics  . Alcohol use: Yes    Alcohol/week: 0.0 oz    Comment: 1 1/2-2 glasses white wine/day  . Drug use: No     Allergies   Amlodipine; Doxycycline monohydrate; and Ramipril   Review of Systems Review of Systems  All other systems reviewed and are negative.    Physical Exam Updated Vital Signs BP (!) 187/88 (BP Location: Right Arm)   Pulse 76   Temp 97.9 F (36.6 C) (Oral)   Resp 16   Ht 5\' 1"  (1.549 m)   Wt 49 kg (108 lb)   SpO2 100%   BMI 20.41 kg/m   Physical Exam  Constitutional: She is oriented to person, place, and time. She appears well-developed. No distress.  Elderly, frail  HENT:  Head: Normocephalic and atraumatic.  Eyes: Pupils are equal, round, and reactive to light. Conjunctivae and EOM are normal.  Neck: Normal range of motion and phonation normal. Neck supple.  Cardiovascular:  Normal rate and regular rhythm.  Pulmonary/Chest: Effort normal and breath sounds normal. She exhibits no tenderness.  Abdominal: Soft. She exhibits no distension. There is no tenderness. There is no guarding.  Musculoskeletal: Normal range of motion.  Normal strength arms and legs bilaterally.  Neurological: She is alert and oriented to person, place, and time. No cranial nerve deficit. She exhibits normal muscle tone. Coordination normal.  No dysarthria, aphasia or nystagmus.  No pronator drift.  No ataxia.  Skin: Skin is warm and dry.  Psychiatric: She has a normal mood and affect. Her behavior is normal. Judgment and thought content normal.  Nursing note and vitals reviewed.    ED Treatments / Results  Labs (all labs ordered are listed, but only abnormal results are displayed) Labs Reviewed  APTT - Abnormal; Notable for the following components:      Result Value   aPTT 38 (*)    All other components within normal limits  CBC - Abnormal; Notable for the following components:   RBC 3.71 (*)    Hemoglobin 11.9 (*)    MCV 101.6 (*)    All other components within normal limits  COMPREHENSIVE METABOLIC PANEL - Abnormal; Notable for the following components:   BUN 28 (*)    Creatinine, Ser 2.79 (*)    Total Protein 6.4 (*)    GFR calc non Af Amer 15 (*)    GFR calc Af Amer 17 (*)    All other components within normal limits  I-STAT CHEM 8, ED - Abnormal; Notable for the following components:   Chloride 98 (*)    BUN 35 (*)    Creatinine, Ser 2.50 (*)    All other components within normal limits  PROTIME-INR  DIFFERENTIAL  I-STAT TROPONIN, ED  CBG MONITORING, ED    EKG EKG Interpretation  Date/Time:  Monday June 20 2017 12:20:50 EDT Ventricular Rate:  77 PR Interval:  152 QRS Duration:  76 QT Interval:  382 QTC Calculation: 432 R Axis:   49 Text Interpretation:  Sinus rhythm with Premature atrial complexes Otherwise normal ECG since last tracing no significant change  Confirmed by Daleen Bo [phone]) on 06/20/2017 8:43:56 PM    Radiology Ct Head Wo Contrast  Result Date: 06/20/2017 CLINICAL DATA:  84 year old female with right face, arm and leg numbness. EXAM: CT HEAD WITHOUT CONTRAST TECHNIQUE: Contiguous axial images were obtained from the base of the skull through the vertex without intravenous contrast. COMPARISON:  Brain MRI 06/03/2016, head CT 10/16/2014 and earlier. FINDINGS: Brain: Stable cerebral volume. No midline shift, ventriculomegaly, mass effect, evidence of mass lesion, intracranial hemorrhage or evidence of cortically based acute infarction. Patchy bilateral cerebral white matter hypodensity appears stable to signal changes on the 2018 MRI. No cortical encephalomalacia identified. Vascular: Calcified atherosclerosis at the skull base. No suspicious intracranial vascular hyperdensity. Skull: stable.  No acute osseous abnormality identified. Sinuses/Orbits: Visualized paranasal sinuses and mastoids are stable and well pneumatized. Other: No acute orbit or scalp soft tissue finding. IMPRESSION: No acute intracranial abnormality. Noncontrast CT appearance of the brain appears stable to the MRI 06/03/2016. Electronically Signed   By: Genevie Ann M.D.   On: 06/20/2017 13:14    Procedures Procedures (including critical care time)  Medications Ordered in ED Medications  hydrochlorothiazide (MICROZIDE) capsule 12.5 mg (has no administration in time range)     Initial Impression / Assessment and Plan / ED Course  I have reviewed the triage vital signs and the nursing notes.  Pertinent labs & imaging results that were available during my care of the patient were reviewed by me and considered in my medical decision making (see chart for details).  Clinical Course as of Jun 20 2101  Mon Jun 20, 2017  2041 Normal except chloride low, BUN high, creatinine  I-Stat Chem 8, ED(!) [EW]  2041 Normal  I-stat troponin, ED [EW]  2042 Normal  Protime-INR [EW]    2042 Normal except BUN high, creatinine high, total protein low  Comprehensive metabolic panel(!) [EW]  2248 Trending of creatinine shows it to be at baseline currently.   [EW]  2042 Normal except hemoglobin low   CBC(!) [EW]  2043 No acute changes, images reviewed.  CT HEAD WO CONTRAST [EW]    Clinical Course User Index [EW] Daleen Bo, MD     Patient Vitals for the past 24 hrs:  BP Temp Temp src Pulse Resp SpO2 Height Weight  06/20/17 2011 (!) 187/88 - - 76 16 100 % - -  06/20/17 1945 (!) 179/90 - - 67 16 100 % - -  06/20/17 1930 (!) 182/80 - - 67 14 100 % - -  06/20/17 1915 (!) 176/78 - - 66 12 100 % - -  06/20/17 1900 (!) 172/92 - - 70 15 99 % - -  06/20/17 1845 (!) 186/86 - - 70 14 100 % - -  06/20/17 1830 (!) 170/131 - - 68 (!) 21 100 % - -  06/20/17 1815 (!) 144/127 - - - - 100 % - -  06/20/17 1645 (!) 143/88 - - 70 19 100 % - -  06/20/17 1615 (!) 185/87 - - 73 17 100 % - -  06/20/17 1527 - - - - - - 5\' 1"  (1.549 m) 49 kg (108 lb)  06/20/17 1450 (!) 157/95 - - 69 16 97 % - -  06/20/17 1346 (!) 186/84 - - 70 18 99 % - -  06/20/17  1225 (!) 160/94 97.9 F (36.6 C) Oral 72 16 100 % - -    8:46 PM Reevaluation with update and discussion. After initial assessment and treatment, an updated evaluation reveals manual blood pressure, by me, right arm 210/90.  Patient is lucid, cooperative, comfortable at this time.  Findings discussed and questions answered. Daleen Bo   Medical Decision Making: Nonspecific symptoms with reassuring evaluation.  Doubt CVA, hypertensive urgency, metabolic instability or impending vascular collapse.  Review of recent blood pressures have been elevated on prior visit, with elevation today.  Will start low-dose diuretic as treatment, and encourage close follow-up with PCP.  CRITICAL CARE-no Performed by: Daleen Bo   Nursing Notes Reviewed/ Care Coordinated Applicable Imaging Reviewed Interpretation of Laboratory Data incorporated into  ED treatment  The patient appears reasonably screened and/or stabilized for discharge and I doubt any other medical condition or other [facility] requiring further screening, evaluation, or treatment in the ED at this time prior to discharge.  Plan: Home Medications-usual medications; Home Treatments-rest, fluids, low-salt diet; return here if the recommended treatment, does not improve the symptoms; Recommended follow up-PCP follow-up 1 week and as needed     Final Clinical Impressions(s) / ED Diagnoses   Final diagnoses:  Malaise  Hypertension, unspecified type    ED Discharge Orders    None       Daleen Bo, MD 06/20/17 2104

## 2017-06-20 NOTE — ED Notes (Signed)
ED Provider at bedside. 

## 2017-06-22 ENCOUNTER — Inpatient Hospital Stay: Payer: Medicare Other | Admitting: Family Medicine

## 2017-06-27 ENCOUNTER — Other Ambulatory Visit: Payer: Self-pay | Admitting: Family Medicine

## 2017-06-27 DIAGNOSIS — R809 Proteinuria, unspecified: Secondary | ICD-10-CM | POA: Diagnosis not present

## 2017-06-27 DIAGNOSIS — N184 Chronic kidney disease, stage 4 (severe): Secondary | ICD-10-CM | POA: Diagnosis not present

## 2017-06-27 DIAGNOSIS — E875 Hyperkalemia: Secondary | ICD-10-CM | POA: Diagnosis not present

## 2017-06-27 DIAGNOSIS — E872 Acidosis: Secondary | ICD-10-CM | POA: Diagnosis not present

## 2017-06-27 DIAGNOSIS — I129 Hypertensive chronic kidney disease with stage 1 through stage 4 chronic kidney disease, or unspecified chronic kidney disease: Secondary | ICD-10-CM | POA: Diagnosis not present

## 2017-06-27 DIAGNOSIS — N2581 Secondary hyperparathyroidism of renal origin: Secondary | ICD-10-CM | POA: Diagnosis not present

## 2017-06-27 NOTE — Telephone Encounter (Addendum)
Pt cancelled her appt for 6/5. Seen at ER 6/3 with unrevealing evaluation besides significant hypertension.  She has scheduled appt 6/14 with me - would call and ensure she keeps this appt.

## 2017-06-27 NOTE — Telephone Encounter (Signed)
Unable to reach pt by phone.

## 2017-06-27 NOTE — Telephone Encounter (Signed)
Spoke with pt confirming her appt on 07/01/17 at 8:00 AM.  States she will be here.

## 2017-06-28 NOTE — Telephone Encounter (Signed)
Last filled:  05/24/17, #50 Last OV:  02/04/17 Next OV:  07/01/17

## 2017-06-28 NOTE — Telephone Encounter (Signed)
Eprescribed.

## 2017-07-01 ENCOUNTER — Inpatient Hospital Stay: Payer: Medicare Other | Admitting: Family Medicine

## 2017-07-04 ENCOUNTER — Telehealth: Payer: Self-pay

## 2017-07-04 NOTE — Telephone Encounter (Signed)
PLEASE NOTE: All timestamps contained within this report are represented as [place] Standard Time. CONFIDENTIALTY NOTICE: This fax transmission is intended only for the addressee. It contains information that is legally privileged, confidential or otherwise protected from use or disclosure. If you are not the intended recipient, you are strictly prohibited from reviewing, disclosing, copying using or disseminating any of this information or taking any action in reliance on or regarding this information. If you have received this fax in error, please notify us immediately by telephone so that we can arrange for its return to [place]. Phone: [phone], Toll-Free: [phone], Fax: [phone] Page: 1 of 2 Call Id: [id number] [name] Patient Name: Nicole English Gender: Female DOB: 1932/07/15 Age: 84 Y 3 M 13 D Return Phone Number: [phone] (Secondary) Address: City/State/Zip: [place] [address] Client [place] [facility] - Client Client Site [place] Physician Ria Bush - MD Contact Type Call Who Is Calling Patient / Member / Family / Caregiver Call Type Triage / Clinical Relationship To Patient Self Return Phone Number [phone] (Secondary) Chief Complaint Unclassified Symptom Reason for Call Symptomatic / Request for [place] states that she is in a facility she is wanting to speak with her doctor. She did not know until this morning the facility she is in can not treat her. She is sick- wuld not say what symptoms she is having Translation No Nurse Assessment Nurse: Leilani Merl, RN, Nira Conn Date/Time (Eastern Time): 07/02/2017 8:44:58 AM Confirm and document reason for call. If symptomatic, describe symptoms. ---[name] states that she is at a living facility and she is nauseated, like she  could vomit, it started during the night. Does the patient have any new or worsening symptoms? ---Yes Will a triage be completed? ---Yes Related visit to physician within the last 2 weeks? ---No Does the PT have any chronic conditions? (i.e. diabetes, asthma, etc.) ---Unknown Is this a behavioral health or substance abuse call? ---No Guidelines Guideline Title Affirmed Question Affirmed Notes Nurse Date/Time (Eastern Time) Nausea [1] Fever > 101 F (38.3 C) AND [2] age > 54 Standifer, RN, Heather 07/02/2017 8:46:51 AM Disp. Time Eilene Ghazi Time) Disposition Final User 07/02/2017 8:52:07 AM Call Completed Standifer, RN, Nira Conn 07/02/2017 8:50:42 AM See Physician within 4 Hours (or PCP triage) Yes Standifer, RN, Soyla Murphy Disagree/Comply Comply PLEASE NOTE: All timestamps contained within this report are represented as [place] Standard Time. CONFIDENTIALTY NOTICE: This fax transmission is intended only for the addressee. It contains information that is legally privileged, confidential or otherwise protected from use or disclosure. If you are not the intended recipient, you are strictly prohibited from reviewing, disclosing, copying using or disseminating any of this information or taking any action in reliance on or regarding this information. If you have received this fax in error, please notify us immediately by telephone so that we can arrange for its return to [place]. Phone: [phone], Toll-Free: [phone], Fax: [phone] Page: 2 of 2 Call Id: [id number] [place] Understands Yes PreDisposition Call Doctor Care Advice Given Per Guideline SEE PHYSICIAN WITHIN 4 HOURS (or PCP triage): * IF OFFICE WILL BE OPEN: You need to be seen within the next 3 or 4 hours. Call your doctor's office now or as soon as it opens. CALL BACK IF: * You become worse. CARE ADVICE given per Nausea (Adult) guideline. Referrals [facility]

## 2017-07-04 NOTE — Telephone Encounter (Signed)
I spoke with pt to get update on how she is feeling. I spoke with pt and today she is doing OK. Pt wants Dr Darnell Level to know she is at [facility]. [place], [place].FYI to Dr Darnell Level.

## 2017-07-05 NOTE — Telephone Encounter (Signed)
Noted. Has missed last several office visits.

## 2017-07-06 ENCOUNTER — Ambulatory Visit: Payer: Self-pay | Admitting: *Deleted

## 2017-07-06 NOTE — Telephone Encounter (Signed)
Pt calling to report "Fever." Has not checked temperature; "I just know I have one and it's the flu." No other symptoms except "Mild aching" onset yesterday. Multiple triage calls relating to similar symptoms. Seen in ED 06/16/17 and 06/20/17. Pt wanted Dr. Danise [name] to be aware of symptoms and that she is presently in [facility]. Home care advise given.  Assured I would let Dr. Danise [name] know she is not feeling well and of her location.  Reason for Disposition . [1] Fever AND [2] no signs of serious infection or localizing symptoms (all other triage questions negative)  Answer Assessment - Initial Assessment Questions 1. TEMPERATURE: "What is the most recent temperature?"  "How was it measured?"     Unsure "I know I have one." 2. ONSET: "When did the fever start?"      Ongoing, intermittent 3. SYMPTOMS: "Do you have any other symptoms besides the fever?"  (e.g., colds, headache, sore throat, earache, cough, rash, diarrhea, vomiting, abdominal pain)     "Mild aching" 4. CAUSE: If there are no symptoms, ask: "What do you think is causing the fever?"      The flu 5. CONTACTS: "Does anyone else in the family have an infection?"     no 6. TREATMENT: "What have you done so far to treat this fever?" (e.g., medications)     nothing 7. IMMUNOCOMPROMISE: "Do you have of the following: diabetes, HIV positive, splenectomy, cancer chemotherapy, chronic steroid treatment, transplant patient, etc."  Protocols used: FEVER-A-AH

## 2017-07-07 NOTE — Telephone Encounter (Signed)
Spoke with pt relaying Dr. Synthia Innocent message. Pt verbalizes understanding but states she has transportation problems.  Says she has been working on getting a ride for about 1 wk.  Pt states a nurse has already diagnosed her with the flu.

## 2017-07-07 NOTE — Telephone Encounter (Signed)
plz notify I recommend evaluation in office for fever or if she's feeling ill. plz schedule office visit if she's willing, but she has no showed last few office visits so would ensure she is planning on keeping appointment.

## 2017-07-10 ENCOUNTER — Other Ambulatory Visit: Payer: Self-pay | Admitting: Family Medicine

## 2017-07-11 ENCOUNTER — Ambulatory Visit: Payer: Self-pay | Admitting: Family Medicine

## 2017-07-11 NOTE — Telephone Encounter (Signed)
Pt c/o legs feeling weak and having to hold onto objects as she is walking as not to fall. Pt was walking around in her apartment and lost the strength to her legs ans slid down a wall. Pt states the weakness began 3 days ago. Pt has had this sx before and stated it went away on its own. Care advice given and appt for 07/13/17 with Dr Danise [name].  Reason for Disposition . [1] MODERATE dizziness (e.g., interferes with normal activities) AND [2] has been evaluated by physician for this  Answer Assessment - Initial Assessment Questions 1. DESCRIPTION: "Describe your dizziness."  get up, as walking she loses strength and hold onto objects to hold onto in order not to fall- today same situation and she slid down the wall 2. LIGHTHEADED: "Do you feel lightheaded?" (e.g., somewhat faint, woozy, weak upon standing)     Yes -weak upon standing 3. VERTIGO: "Do you feel like either you or the room is spinning or tilting?" (i.e. vertigo)     no 4. SEVERITY: "How bad is it?"  "Do you feel like you are going to faint?" "Can you stand and walk?"   - MILD - walking normally   - MODERATE - interferes with normal activities (e.g., work, school)    - SEVERE - unable to stand, requires support to walk, feels like passing out now.      Moderate  5. ONSET:  "When did the dizziness begin?"     3 days ago 6. AGGRAVATING FACTORS: "Does anything make it worse?" (e.g., standing, change in head position)     Pt is unsure 7. HEART RATE: "Can you tell me your heart rate?" "How many beats in 15 seconds?"  (Note: not all patients can do this)       Pt cant perform this 8. CAUSE: "What do you think is causing the dizziness?"     Pt doesn't know 9. RECURRENT SYMPTOM: "Have you had dizziness before?" If so, ask: "When was the last time?" "What happened that time?"     Yes-feels like she is going to faint or like her legs are going to give out-went away on its own 10. OTHER SYMPTOMS: "Do you have any other symptoms?" (e.g.,  fever, chest pain, vomiting, diarrhea, bleeding)       no 11. PREGNANCY: "Is there any chance you are pregnant?" "When was your last menstrual period?"       n/a  Protocols used: DIZZINESS [facility]

## 2017-07-11 NOTE — Telephone Encounter (Signed)
Last filled: 06/12/17, #60 Last OV:  02/04/17 Next OV:  none

## 2017-07-13 ENCOUNTER — Ambulatory Visit (INDEPENDENT_AMBULATORY_CARE_PROVIDER_SITE_OTHER): Payer: Medicare Other | Admitting: Family Medicine

## 2017-07-13 ENCOUNTER — Encounter: Payer: Self-pay | Admitting: Family Medicine

## 2017-07-13 ENCOUNTER — Other Ambulatory Visit: Payer: Self-pay | Admitting: Family Medicine

## 2017-07-13 VITALS — BP 92/58 | HR 51 | Temp 97.8°F | Ht 61.0 in | Wt 106.2 lb

## 2017-07-13 DIAGNOSIS — I951 Orthostatic hypotension: Secondary | ICD-10-CM | POA: Diagnosis not present

## 2017-07-13 MED ORDER — FUROSEMIDE 20 MG PO TABS: 20.0000 mg | ORAL_TABLET | Freq: Every day | ORAL | 1 refills | Status: AC

## 2017-07-13 MED ORDER — METOPROLOL SUCCINATE ER 25 MG PO TB24: 12.5000 mg | ORAL_TABLET | Freq: Every day | ORAL | 0 refills | Status: DC

## 2017-07-13 MED ORDER — BISOPROLOL FUMARATE 5 MG PO TABS: 2.5000 mg | ORAL_TABLET | Freq: Every day | ORAL | 1 refills | Status: DC

## 2017-07-13 NOTE — Telephone Encounter (Signed)
Looks like bisoprolol is on backorder and the pharmacy is suggesting metoprolol as an alternative.

## 2017-07-13 NOTE — Progress Notes (Signed)
BP (!) 92/58 (BP Location: Right Arm, Cuff Size: Normal)   Pulse (!) 51   Temp 97.8 F (36.6 C) (Oral)   Ht 5\' 1"  (1.549 m)   Wt 106 lb 4 oz (48.2 kg)   SpO2 96%   BMI 20.08 kg/m   No data found. Marked orthostatics - 140/68 supine, 92/58 standing    CC: dizziness Subjective:    Patient ID: Nicole English, female    DOB: Nov 17, 1932, 84 y.o.   MRN: [id number]  HPI: Nicole English is a 84 y.o. female presenting on 07/13/2017 for Dizziness (Started a few weeks ago. Fell yesterday. Felt like her legs went out. )   See recent telephone encounters - multiple non specific complaints over the last several months, unrevealing ER visits, has not kept appointments at our office. Last seen here 01/2017. Now living in independent living facility [facility] in [place] (prior living with her youngest son Hassell Done). This is her first week there - she feels she will do well here. She is managing her own medications.   Today she states she's overall feeling well, but this week has felt uneasy - "losing strength in legs". Endorses dizziness and lightheadedness that started this week.   She takes lasix 20mg  (1.5 tablets for a dose of 30mg ) daily and bisprolol 5mg  daily.  Endorses good hydration status.   Denies current fevers, chills, headache or congestion or cough. Denies leg swelling, dyspnea, chest pain.  Last saw nephrology 06/27/2017 [facility]), note reviewed.  Endorses some memory lapses "last few days" since she's not been feeling well.   Relevant past medical, surgical, family and social history reviewed and updated as indicated. Interim medical history since our last visit reviewed. Allergies and medications reviewed and updated. Outpatient Medications Prior to Visit  Medication Sig Dispense Refill  . acetaminophen (TYLENOL) 500 MG tablet Take 1,000 mg by mouth 2 (two) times daily as needed (pain).     Marland Kitchen aspirin EC 81 MG tablet Take 81 mg by mouth daily.    . calcitRIOL (ROCALTROL)  0.25 MCG capsule Take 0.25 mcg by mouth 3 (three) times a week.     . gabapentin (NEURONTIN) 100 MG capsule TAKE ONE CAPSULE BY MOUTH TWICE A DAY 60 capsule 2  . ondansetron (ZOFRAN) 4 MG tablet Take 1 tablet (4 mg total) by mouth every 6 (six) hours. 12 tablet 0  . Probiotic Product (VSL#3) CAPS TAKE 1 CAPSULE BY MOUTH DAILY. 60 capsule 0  . traMADol (ULTRAM) 50 MG tablet TAKE 1 TABLET BY MOUTH TWICE A DAY AS NEEDED 50 tablet 0  . bisoprolol (ZEBETA) 5 MG tablet TAKE 1 TABLET BY MOUTH ONCE DAILY 90 tablet 3  . furosemide (LASIX) 20 MG tablet Take 30 mg by mouth daily.     No facility-administered medications prior to visit.      Per HPI unless specifically indicated in ROS section below Review of Systems     Objective:    BP (!) 92/58 (BP Location: Right Arm, Cuff Size: Normal)   Pulse (!) 51   Temp 97.8 F (36.6 C) (Oral)   Ht 5\' 1"  (1.549 m)   Wt 106 lb 4 oz (48.2 kg)   SpO2 96%   BMI 20.08 kg/m   Wt Readings from Last 3 Encounters:  07/13/17 106 lb 4 oz (48.2 kg)  06/20/17 108 lb (49 kg)  06/16/17 108 lb (49 kg)    Physical Exam  Constitutional: She appears well-developed and well-nourished. No distress.  Walks with cane  HENT:  Mouth/Throat: Oropharynx is clear and moist. No oropharyngeal exudate.  Cardiovascular: Normal rate, regular rhythm and normal heart sounds.  No murmur heard. Pulmonary/Chest: Effort normal and breath sounds normal. No respiratory distress. She has no wheezes. She has no rales.  Musculoskeletal: She exhibits no edema.  Nursing note and vitals reviewed.  Results for orders placed or performed during the hospital encounter of 06/20/17  Protime-INR  Result Value Ref Range   Prothrombin Time 13.4 11.4 - 15.2 seconds   INR 1.02   APTT  Result Value Ref Range   aPTT 38 (H) 24 - 36 seconds  CBC  Result Value Ref Range   WBC 5.7 4.0 - 10.5 K/uL   RBC 3.71 (L) 3.87 - 5.11 MIL/uL   Hemoglobin 11.9 (L) 12.0 - 15.0 g/dL   HCT 37.7 36.0 - 46.0 %     MCV 101.6 (H) 78.0 - 100.0 fL   MCH 32.1 26.0 - 34.0 pg   MCHC 31.6 30.0 - 36.0 g/dL   RDW 12.3 11.5 - 15.5 %   Platelets 284 150 - 400 K/uL  Differential  Result Value Ref Range   Neutrophils Relative % 56 %   Neutro Abs 3.2 1.7 - 7.7 K/uL   Lymphocytes Relative 29 %   Lymphs Abs 1.7 0.7 - 4.0 K/uL   Monocytes Relative 12 %   Monocytes Absolute 0.7 0.1 - 1.0 K/uL   Eosinophils Relative 2 %   Eosinophils Absolute 0.1 0.0 - 0.7 K/uL   Basophils Relative 1 %   Basophils Absolute 0.1 0.0 - 0.1 K/uL   Immature Granulocytes 0 %   Abs Immature Granulocytes 0.0 0.0 - 0.1 K/uL  Comprehensive metabolic panel  Result Value Ref Range   Sodium 136 135 - 145 mmol/L   Potassium 4.1 3.5 - 5.1 mmol/L   Chloride 101 101 - 111 mmol/L   CO2 24 22 - 32 mmol/L   Glucose, Bld 99 65 - 99 mg/dL   BUN 28 (H) 6 - 20 mg/dL   Creatinine, Ser 2.79 (H) 0.44 - 1.00 mg/dL   Calcium 9.2 8.9 - 10.3 mg/dL   Total Protein 6.4 (L) 6.5 - 8.1 g/dL   Albumin 3.6 3.5 - 5.0 g/dL   AST 22 15 - 41 U/L   ALT 18 14 - 54 U/L   Alkaline Phosphatase 72 38 - 126 U/L   Total Bilirubin 0.5 0.3 - 1.2 mg/dL   GFR calc non Af Amer 15 (L) >60 mL/min   GFR calc Af Amer 17 (L) >60 mL/min   Anion gap 11 5 - 15  I-stat troponin, ED  Result Value Ref Range   Troponin i, poc 0.00 0.00 - 0.08 ng/mL   Comment 3          I-Stat Chem 8, ED  Result Value Ref Range   Sodium 135 135 - 145 mmol/L   Potassium 4.2 3.5 - 5.1 mmol/L   Chloride 98 (L) 101 - 111 mmol/L   BUN 35 (H) 6 - 20 mg/dL   Creatinine, Ser 2.50 (H) 0.44 - 1.00 mg/dL   Glucose, Bld 96 65 - 99 mg/dL   Calcium, Ion 1.22 1.15 - 1.40 mmol/L   TCO2 26 22 - 32 mmol/L   Hemoglobin 12.6 12.0 - 15.0 g/dL   HCT 37.0 36.0 - 46.0 %      Assessment & Plan:   Problem List Items Addressed This Visit    Orthostatic hypotension - Primary  Marked orthostasis on exam today - this is likely contributing to recent weakness, dizziness, may also be contributing to recent  confusional episodes.  I will decrease bisoprolol to 2.5mg  daily, decrease lasix to 20mg  daily. I have asked her to return in 3 weeks for close f/u - will need MMSE at that time to further eval memory. Pt agrees with plan.       Relevant Medications   furosemide (LASIX) 20 MG tablet   bisoprolol (ZEBETA) 5 MG tablet       Meds ordered this encounter  Medications  . furosemide (LASIX) 20 MG tablet    Sig: Take 1 tablet (20 mg total) by mouth daily.    Dispense:  90 tablet    Refill:  1    Note new sig  . bisoprolol (ZEBETA) 5 MG tablet    Sig: Take 0.5 tablets (2.5 mg total) by mouth daily.    Dispense:  45 tablet    Refill:  1    Note new sig   No orders of the defined types were placed in this encounter.   Follow up plan: Return in about 3 weeks (around 08/03/2017) for follow up visit.  Ria Bush, MD

## 2017-07-13 NOTE — Patient Instructions (Addendum)
Blood pressures did drop significantly when you stood up - I do recommend we decrease lasix to 20mg  once daily (1 tablet) and bisoprolol to 2.5mg  (1/2 tablet) daily.  Make sure you're staying well hydrated at home.  Return in 3 weeks for follow up visit.

## 2017-07-13 NOTE — Telephone Encounter (Signed)
Pt should have some bisoprolol left as she's only to take 1/2 tablet daily for now - however when she runs out I have sent in toprol XL 25mg  to take 1/2 tablet (12.5mg ) daily until we are able to return to bisoprolol.

## 2017-07-13 NOTE — Assessment & Plan Note (Signed)
Marked orthostasis on exam today - this is likely contributing to recent weakness, dizziness, may also be contributing to recent confusional episodes.  I will decrease bisoprolol to 2.5mg  daily, decrease lasix to 20mg  daily. I have asked her to return in 3 weeks for close f/u - will need MMSE at that time to further eval memory. Pt agrees with plan.

## 2017-07-13 NOTE — Telephone Encounter (Signed)
Spoke with pt relaying Dr. G's message and instructions.  Pt verbalizes understanding. 

## 2017-07-17 ENCOUNTER — Other Ambulatory Visit: Payer: Self-pay | Admitting: Cardiology

## 2017-07-28 ENCOUNTER — Other Ambulatory Visit: Payer: Self-pay | Admitting: Family Medicine

## 2017-07-29 ENCOUNTER — Telehealth: Payer: Self-pay

## 2017-07-29 NOTE — Telephone Encounter (Signed)
Copied from [place] [id number]. Topic: General - Other >> Jul 29, 2017  2:53 PM Carolyn Stare wrote:  Nicole English:  Pt call to say where she lives does not transport her to [place] and not sure she will be able to keep the appt on Wednesday 08/03/17. I did offer her a location in [place] and she said she wanted to speak with Dr Darnell Level before making that decision about transferring    She wanted me to relay this message to Dr Darnell Level

## 2017-07-29 NOTE — Telephone Encounter (Signed)
Name of Medication: Tramadol Name of Pharmacy: [facility] or Written Date and Quantity: 06/28/17, #50 Last Office Visit and Type: 07/13/17, f/u Next Office Visit and Type: 08/03/17, f/u Last Controlled Substance Agreement Date: 01/08/14 Last UDS: 09/03/14

## 2017-07-29 NOTE — Telephone Encounter (Signed)
Eprescribed.

## 2017-08-02 ENCOUNTER — Telehealth: Payer: Self-pay

## 2017-08-02 NOTE — Telephone Encounter (Signed)
I have not seen anything from the tower for this pt.  Dr. Darnell Level, do you receive anything for this pt?

## 2017-08-02 NOTE — Telephone Encounter (Signed)
Copied from [place] [id number]. Topic: Quick Communication - See Telephone Encounter >> Aug 02, 2017  4:21 PM Gardiner Ramus wrote: CRM for notification. See Telephone encounter for: 08/02/17. Chelsea from [facility] called and stated that she had sent a fax over for pt and wanted to know if the office has recived. Please advise CB#[phone]

## 2017-08-03 ENCOUNTER — Ambulatory Visit (INDEPENDENT_AMBULATORY_CARE_PROVIDER_SITE_OTHER): Payer: Medicare Other | Admitting: Family Medicine

## 2017-08-03 ENCOUNTER — Encounter: Payer: Self-pay | Admitting: Family Medicine

## 2017-08-03 VITALS — BP 120/78 | Temp 98.3°F | Ht 61.0 in | Wt 107.2 lb

## 2017-08-03 DIAGNOSIS — R4189 Other symptoms and signs involving cognitive functions and awareness: Secondary | ICD-10-CM | POA: Diagnosis not present

## 2017-08-03 DIAGNOSIS — I10 Essential (primary) hypertension: Secondary | ICD-10-CM | POA: Diagnosis not present

## 2017-08-03 DIAGNOSIS — I951 Orthostatic hypotension: Secondary | ICD-10-CM

## 2017-08-03 MED ORDER — DONEPEZIL HCL 5 MG PO TABS: 5.0000 mg | ORAL_TABLET | Freq: Every day | ORAL | Status: DC

## 2017-08-03 NOTE — Telephone Encounter (Signed)
Spoke at [place] today

## 2017-08-03 NOTE — Assessment & Plan Note (Signed)
BP better managed on current regimen - continue. She has significant orthostasis - consider titrating med to standing blood pressures.  She should currently be taking metoprolol XL in place of bisoprolol (backorder)

## 2017-08-03 NOTE — Patient Instructions (Addendum)
Wednesdays afternoon we are open until 7pm. That may be an easier option to schedule future appointments.  Blood pressures are looking better on current regimen - continue.  Memory testing today suggesting possible trouble with memory. I do recommend we start aricept 5mg  with dinner. Watch for nausea on this medicine. We will refer you to neurology for evaluation as well.

## 2017-08-03 NOTE — Assessment & Plan Note (Addendum)
MMSE today - 24/30 - missed 3 orientation and all 3 recall (but 3/3 with cue). 2/4 clock drawing test. Independent in ADLs, partially dependent in IADLs.  Discussed MMSE concerning for possible memory deficits - 0/3 recall, 3/3 with cue.  Encouraged continued mental exercises, frequent reading, physical activity and social engagement to help with memory.  rec start aricept 5mg  daily - watching for nausea or bradycardia. Will refer to neurology for full evaluation. Pt agrees with plan. She asked me to bring son back and we reviewed all of the above. He brings up concerns that he's worried pt is not taking medications appropriately. Fortunately she will start receiving medication administration assistance at [facility].

## 2017-08-03 NOTE — Assessment & Plan Note (Signed)
Symptoms improved with lower antihypertensive doses. Continue current regimen.

## 2017-08-03 NOTE — Assessment & Plan Note (Deleted)
BP better managed on current regimen - continue. She has significant orthostasis - consider titrating med to standing blood pressures.  She should currently be taking metoprolol XL in place of bisoprolol (backorder)

## 2017-08-03 NOTE — Progress Notes (Signed)
BP 120/78 (BP Location: Left Arm, Patient Position: Standing, Cuff Size: Normal)   Temp 98.3 F (36.8 C) (Oral)   Ht 5\' 1"  (1.549 m)   Wt 107 lb 4 oz (48.6 kg)   SpO2 94%   BMI 20.26 kg/m    No data found.  CC: 3 wk f/u visit Subjective:    Patient ID: Nicole English, female    DOB: 1932/03/12, 84 y.o.   MRN: [id number]  HPI: Nicole English is a 84 y.o. female presenting on 08/03/2017 for Hypertension (Here for 3 wk othostatic HTN f/u. Pt still c/o of some dizziness.)   See prior note for details.  Last seen here with concern for antihypertensive overtreatment as her BP in office was 92/58 on standing (140/68 supine).   Last visit we decreased bisoprolol to 2.5mg  daily, decreased lasix to 20mg  daily. Here for close f/u. She feels she's done better with this change - does not recall and dizzy spells. Denies recent falls.   Now lives in [place] - she likes it there, enjoys social engagement and scheduled meals. Her son Nicole English brought her today.  Ongoing pain "all over" throughout muscles. Taking tramadol for this.  Excited about starting physical therapy. We faxed approved request back to [facility]) today.   Denies urinary incontinence.  Geriatric Assessment: Activities of Daily Living:     Bathing- independent    Dressing- independent    Eating- independent    Toileting- independent    Transferring- independent    Continence- independent Overall Assessment: independent  Instrumental Activities of Daily Living:     Transportation- dependent (stopped driving several years ago)    Meal/Food Preparation- dependent - now offered by [facility]    Shopping Errands- dependent ("I'm not a shopper")    Housekeeping/Chores- independent    Money Management/Finances- independent    Medication Management- independent - but will soon get assistance through SN    Ability to Use Telephone- independent    Laundry- dependent Overall Assessment: partially dependent  Mental  Status Exam: 24/30, 27/30 with cue (value/max value) Clock Drawing Score: 2/4  Relevant past medical, surgical, family and social history reviewed and updated as indicated. Interim medical history since our last visit reviewed. Allergies and medications reviewed and updated. Outpatient Medications Prior to Visit  Medication Sig Dispense Refill  . acetaminophen (TYLENOL) 500 MG tablet Take 1,000 mg by mouth 2 (two) times daily as needed (pain).     Marland Kitchen aspirin EC 81 MG tablet Take 81 mg by mouth daily.    . bisoprolol (ZEBETA) 5 MG tablet Take 0.5 tablets (2.5 mg total) by mouth daily. 45 tablet 1  . calcitRIOL (ROCALTROL) 0.25 MCG capsule Take 0.25 mcg by mouth 3 (three) times a week.     . furosemide (LASIX) 20 MG tablet Take 1 tablet (20 mg total) by mouth daily. 90 tablet 1  . gabapentin (NEURONTIN) 100 MG capsule TAKE ONE CAPSULE BY MOUTH TWICE A DAY 60 capsule 2  . metoprolol succinate (TOPROL-XL) 25 MG 24 hr tablet Take 0.5 tablets (12.5 mg total) by mouth daily. 45 tablet 0  . ondansetron (ZOFRAN) 4 MG tablet Take 1 tablet (4 mg total) by mouth every 6 (six) hours. 12 tablet 0  . Probiotic Product (VSL#3) CAPS TAKE 1 CAPSULE BY MOUTH DAILY. 60 capsule 0  . traMADol (ULTRAM) 50 MG tablet TAKE 1 TABLET BY MOUTH TWICE A DAY AS NEEDED. NEED OFFICE VISIT 50 tablet 0   No facility-administered medications  prior to visit.      Per HPI unless specifically indicated in ROS section below Review of Systems     Objective:    BP 120/78 (BP Location: Left Arm, Patient Position: Standing, Cuff Size: Normal)   Temp 98.3 F (36.8 C) (Oral)   Ht 5\' 1"  (1.549 m)   Wt 107 lb 4 oz (48.6 kg)   SpO2 94%   BMI 20.26 kg/m   Wt Readings from Last 3 Encounters:  08/03/17 107 lb 4 oz (48.6 kg)  07/13/17 106 lb 4 oz (48.2 kg)  06/20/17 108 lb (49 kg)    Physical Exam  Constitutional: She appears well-developed and well-nourished. No distress.  Psychiatric: She has a normal mood and affect. Her  behavior is normal.  Nursing note and vitals reviewed.      Assessment & Plan:   Problem List Items Addressed This Visit    Orthostatic hypotension    Symptoms improved with lower antihypertensive doses. Continue current regimen.       HTN (hypertension)    BP better managed on current regimen - continue. She has significant orthostasis - consider titrating med to standing blood pressures.  She should currently be taking metoprolol XL in place of bisoprolol (backorder)      Cognitive decline - Primary    MMSE today - 24/30 - missed 3 orientation and all 3 recall (but 3/3 with cue). 2/4 clock drawing test. Independent in ADLs, partially dependent in IADLs.  Discussed MMSE concerning for possible memory deficits - 0/3 recall, 3/3 with cue.  Encouraged continued mental exercises, frequent reading, physical activity and social engagement to help with memory.  rec start aricept 5mg  daily - watching for nausea or bradycardia. Will refer to neurology for full evaluation. Pt agrees with plan. She asked me to bring son back and we reviewed all of the above. He brings up concerns that he's worried pt is not taking medications appropriately. Fortunately she will start receiving medication administration assistance at [facility].          Meds ordered this encounter  Medications  . donepezil (ARICEPT) 5 MG tablet    Sig: Take 1 tablet (5 mg total) by mouth at bedtime.    Dispense:  30 tablet    Refill:  03   No orders of the defined types were placed in this encounter.   Follow up plan: Return in about 3 months (around 11/03/2017) for follow up visit, medicare wellness visit.  Ria Bush, MD

## 2017-08-03 NOTE — Telephone Encounter (Signed)
Faxed order to [employer].  Left message on vm for Chelsea confirming form was received, signed dated and I just faxed it back.

## 2017-08-03 NOTE — Telephone Encounter (Signed)
Filled and in Lisa's box 

## 2017-08-04 DIAGNOSIS — R488 Other symbolic dysfunctions: Secondary | ICD-10-CM | POA: Diagnosis not present

## 2017-08-04 DIAGNOSIS — R41841 Cognitive communication deficit: Secondary | ICD-10-CM | POA: Diagnosis not present

## 2017-08-04 DIAGNOSIS — R2681 Unsteadiness on feet: Secondary | ICD-10-CM | POA: Diagnosis not present

## 2017-08-05 DIAGNOSIS — R41841 Cognitive communication deficit: Secondary | ICD-10-CM | POA: Diagnosis not present

## 2017-08-05 DIAGNOSIS — R488 Other symbolic dysfunctions: Secondary | ICD-10-CM | POA: Diagnosis not present

## 2017-08-05 DIAGNOSIS — Z9181 History of falling: Secondary | ICD-10-CM | POA: Diagnosis not present

## 2017-08-05 DIAGNOSIS — M6281 Muscle weakness (generalized): Secondary | ICD-10-CM | POA: Diagnosis not present

## 2017-08-05 DIAGNOSIS — R2681 Unsteadiness on feet: Secondary | ICD-10-CM | POA: Diagnosis not present

## 2017-08-08 DIAGNOSIS — R488 Other symbolic dysfunctions: Secondary | ICD-10-CM | POA: Diagnosis not present

## 2017-08-08 DIAGNOSIS — M6281 Muscle weakness (generalized): Secondary | ICD-10-CM | POA: Diagnosis not present

## 2017-08-08 DIAGNOSIS — R41841 Cognitive communication deficit: Secondary | ICD-10-CM | POA: Diagnosis not present

## 2017-08-08 DIAGNOSIS — R2681 Unsteadiness on feet: Secondary | ICD-10-CM | POA: Diagnosis not present

## 2017-08-08 DIAGNOSIS — Z9181 History of falling: Secondary | ICD-10-CM | POA: Diagnosis not present

## 2017-08-09 DIAGNOSIS — R41841 Cognitive communication deficit: Secondary | ICD-10-CM | POA: Diagnosis not present

## 2017-08-09 DIAGNOSIS — R488 Other symbolic dysfunctions: Secondary | ICD-10-CM | POA: Diagnosis not present

## 2017-08-09 DIAGNOSIS — M6281 Muscle weakness (generalized): Secondary | ICD-10-CM | POA: Diagnosis not present

## 2017-08-09 DIAGNOSIS — Z9181 History of falling: Secondary | ICD-10-CM | POA: Diagnosis not present

## 2017-08-09 DIAGNOSIS — R2681 Unsteadiness on feet: Secondary | ICD-10-CM | POA: Diagnosis not present

## 2017-08-10 DIAGNOSIS — M6281 Muscle weakness (generalized): Secondary | ICD-10-CM | POA: Diagnosis not present

## 2017-08-10 DIAGNOSIS — Z9181 History of falling: Secondary | ICD-10-CM | POA: Diagnosis not present

## 2017-08-10 DIAGNOSIS — R488 Other symbolic dysfunctions: Secondary | ICD-10-CM | POA: Diagnosis not present

## 2017-08-10 DIAGNOSIS — R2681 Unsteadiness on feet: Secondary | ICD-10-CM | POA: Diagnosis not present

## 2017-08-10 DIAGNOSIS — R41841 Cognitive communication deficit: Secondary | ICD-10-CM | POA: Diagnosis not present

## 2017-08-11 DIAGNOSIS — Z9181 History of falling: Secondary | ICD-10-CM | POA: Diagnosis not present

## 2017-08-11 DIAGNOSIS — R2681 Unsteadiness on feet: Secondary | ICD-10-CM | POA: Diagnosis not present

## 2017-08-11 DIAGNOSIS — R488 Other symbolic dysfunctions: Secondary | ICD-10-CM | POA: Diagnosis not present

## 2017-08-11 DIAGNOSIS — M6281 Muscle weakness (generalized): Secondary | ICD-10-CM | POA: Diagnosis not present

## 2017-08-11 DIAGNOSIS — R41841 Cognitive communication deficit: Secondary | ICD-10-CM | POA: Diagnosis not present

## 2017-08-12 ENCOUNTER — Telehealth: Payer: Self-pay | Admitting: Family Medicine

## 2017-08-12 DIAGNOSIS — R4189 Other symptoms and signs involving cognitive functions and awareness: Secondary | ICD-10-CM

## 2017-08-12 DIAGNOSIS — R488 Other symbolic dysfunctions: Secondary | ICD-10-CM | POA: Diagnosis not present

## 2017-08-12 DIAGNOSIS — M6281 Muscle weakness (generalized): Secondary | ICD-10-CM | POA: Diagnosis not present

## 2017-08-12 DIAGNOSIS — R41841 Cognitive communication deficit: Secondary | ICD-10-CM | POA: Diagnosis not present

## 2017-08-12 DIAGNOSIS — R2681 Unsteadiness on feet: Secondary | ICD-10-CM | POA: Diagnosis not present

## 2017-08-12 DIAGNOSIS — Z9181 History of falling: Secondary | ICD-10-CM | POA: Diagnosis not present

## 2017-08-12 NOTE — Telephone Encounter (Signed)
Copied from [place] [id number]. Topic: Quick Communication - See Telephone Encounter >> Aug 12, 2017 11:09 AM Bea Graff, NT wrote: CRM for notification. See Telephone encounter for: 08/12/17. Pts son, Annie Main calling to check status on referral to neurology that was mentioned during his moms appt on 08/03/17. He states that she needs an appointment asap as she is struggling with her memory. CB#: [phone]

## 2017-08-12 NOTE — Telephone Encounter (Signed)
Noted.  Attempted to contact pt to inform her neuro referral was sent.  No answer. VM box is full.

## 2017-08-12 NOTE — Telephone Encounter (Signed)
I spoke with Leafy Ro RN and was advised that the last DPR takes the place of other DPR. Last DPR allows me to speak with Olin Hauser. The phone # for Olin Hauser from [facility] remains busy. Pt seen 08/03/17 and in the office note refer neurology for full evaluation. Rosaria Ferries said needs referral placed.

## 2017-08-12 NOTE — Telephone Encounter (Signed)
I put in the referral.  Thanks.  

## 2017-08-13 NOTE — Telephone Encounter (Signed)
Thank you. I'm sorry looks like I did not place referral at office visit.

## 2017-08-15 DIAGNOSIS — R488 Other symbolic dysfunctions: Secondary | ICD-10-CM | POA: Diagnosis not present

## 2017-08-15 DIAGNOSIS — R41841 Cognitive communication deficit: Secondary | ICD-10-CM | POA: Diagnosis not present

## 2017-08-15 DIAGNOSIS — Z9181 History of falling: Secondary | ICD-10-CM | POA: Diagnosis not present

## 2017-08-15 DIAGNOSIS — M6281 Muscle weakness (generalized): Secondary | ICD-10-CM | POA: Diagnosis not present

## 2017-08-15 DIAGNOSIS — R2681 Unsteadiness on feet: Secondary | ICD-10-CM | POA: Diagnosis not present

## 2017-08-15 NOTE — Telephone Encounter (Addendum)
Attempted to contact pt to inform her neuro referral was sent.  No answer. VM box is full.

## 2017-08-16 DIAGNOSIS — Z9181 History of falling: Secondary | ICD-10-CM | POA: Diagnosis not present

## 2017-08-16 DIAGNOSIS — R41841 Cognitive communication deficit: Secondary | ICD-10-CM | POA: Diagnosis not present

## 2017-08-16 DIAGNOSIS — R2681 Unsteadiness on feet: Secondary | ICD-10-CM | POA: Diagnosis not present

## 2017-08-16 DIAGNOSIS — M6281 Muscle weakness (generalized): Secondary | ICD-10-CM | POA: Diagnosis not present

## 2017-08-16 DIAGNOSIS — R488 Other symbolic dysfunctions: Secondary | ICD-10-CM | POA: Diagnosis not present

## 2017-08-17 DIAGNOSIS — M6281 Muscle weakness (generalized): Secondary | ICD-10-CM | POA: Diagnosis not present

## 2017-08-17 DIAGNOSIS — R41841 Cognitive communication deficit: Secondary | ICD-10-CM | POA: Diagnosis not present

## 2017-08-17 DIAGNOSIS — R2681 Unsteadiness on feet: Secondary | ICD-10-CM | POA: Diagnosis not present

## 2017-08-17 DIAGNOSIS — R488 Other symbolic dysfunctions: Secondary | ICD-10-CM | POA: Diagnosis not present

## 2017-08-17 DIAGNOSIS — Z9181 History of falling: Secondary | ICD-10-CM | POA: Diagnosis not present

## 2017-08-17 NOTE — Telephone Encounter (Signed)
Left message to call back.  Need to inform pt a referral for neurology has been placed.

## 2017-08-18 ENCOUNTER — Ambulatory Visit (HOSPITAL_COMMUNITY)
Admission: EM | Admit: 2017-08-18 | Discharge: 2017-08-18 | Disposition: A | Discharge status: Home / Self Care | Payer: Medicare Other | Attending: Family Medicine | Admitting: Family Medicine

## 2017-08-18 ENCOUNTER — Encounter (HOSPITAL_COMMUNITY): Payer: Self-pay

## 2017-08-18 DIAGNOSIS — R2681 Unsteadiness on feet: Secondary | ICD-10-CM | POA: Diagnosis not present

## 2017-08-18 DIAGNOSIS — M6281 Muscle weakness (generalized): Secondary | ICD-10-CM | POA: Diagnosis not present

## 2017-08-18 DIAGNOSIS — R41841 Cognitive communication deficit: Secondary | ICD-10-CM | POA: Diagnosis not present

## 2017-08-18 DIAGNOSIS — Z9181 History of falling: Secondary | ICD-10-CM | POA: Diagnosis not present

## 2017-08-18 DIAGNOSIS — R488 Other symbolic dysfunctions: Secondary | ICD-10-CM | POA: Diagnosis not present

## 2017-08-18 DIAGNOSIS — R300 Dysuria: Secondary | ICD-10-CM | POA: Diagnosis not present

## 2017-08-18 LAB — POCT URINALYSIS DIP (DEVICE)
BILIRUBIN URINE: NEGATIVE
Glucose, UA: NEGATIVE mg/dL
HGB URINE DIPSTICK: NEGATIVE
Ketones, ur: NEGATIVE mg/dL
Leukocytes, UA: NEGATIVE
NITRITE: NEGATIVE
PROTEIN: 100 mg/dL — AB
SPECIFIC GRAVITY, URINE: 1.02 (ref 1.005–1.030)
Urobilinogen, UA: 0.2 mg/dL (ref 0.0–1.0)
pH: 6 (ref 5.0–8.0)

## 2017-08-18 NOTE — ED Triage Notes (Signed)
Pt presents with urinary tract symptoms

## 2017-08-18 NOTE — Discharge Instructions (Signed)
No sign of infection in urine Please continue to drink plenty of fluids If having vaginal dryness/irritation may try a small amount of coconut oil intravaginally.  Please follow up if symptoms worsening, developing fever, nausea, vomiting, abdominal pain or worsening burning with urination

## 2017-08-18 NOTE — Telephone Encounter (Signed)
Mailing a letter.  

## 2017-08-18 NOTE — ED Provider Notes (Signed)
[place]    CSN: [id number] Arrival date & time: 08/18/17  1430     History   Chief Complaint Chief Complaint  Patient presents with  . Urinary Tract Infection    HPI Nicole English is a 84 y.o. female history of CKD, osteoporosis, hypertension, COPD presenting today for evaluation of dysuria.  Patient states that she has had some slight discomfort with urination.  Denies any increased frequency or urgency.  Denies any change in color of urine or hematuria.  Denies any abdominal pain or suprapubic discomfort, does admit to some mild lower back pain.  Denies fevers, nausea, vomiting.  She was advised to come have her urine checked from her home health nurse that she has been dehydrated recently.  She lives at [facility], patient has had improvement in her dehydration.  Increased energy.  Denies vaginal irritation or itching.  Denies vaginal discharge.  HPI  Past Medical History:  Diagnosis Date  . Abdominal aortic atherosclerosis ([place]) 07/2015   by CT  . Abnormal drug screen 12/2013   abnormally neg xanax (12/2013) and neg xanax and positive EtOH (04/2014), appropriate (08/2014)  . Allergic rhinitis   . Anxiety   . CKD (chronic kidney disease) stage 4, GFR 15-29 ml/min (HCC) 12/2012   per pt after brown recluse bite. sees Dr Juleen China, off ACEI, bp ok slightly elevated to maintain renal perfusion  . 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 DDD (degenerative disc disease), lumbar   . Depression   . Diastolic dysfunction 1660   per echo in 11/2010 and again 03/2012; normal EF  . Diverticulosis    colonsocopy 2002 aborted 2/2 tortuous colon and heavy sigmoid diverticulosis  . Dysphagia    EGD 2012 - wnl, s/p esoph dilation  . GERD (gastroesophageal reflux disease)   . Glucose intolerance (impaired glucose tolerance)   . History of CVA (cerebrovascular accident) 2012   old per prior head CT    . History of diverticulitis of colon   . HLD (hyperlipidemia)   . HTN (hypertension)    Dr. Martinique, cards  . IBS (irritable bowel syndrome)   . Migraine   . Osteoporosis 08/2013   Spine -2.2, hip -3.5  . Rib fractures 10/11/2014  . Right upper lobe pneumonia ([place]) 03/25/2014  . SUI (stress urinary incontinence, female)    Dr. Amalia Hailey, urology (some urge as well)    Patient Active Problem List   Diagnosis Date Noted  . Orthostatic hypotension 07/13/2017  . Rectal pain 09/13/2016  . Cognitive decline 07/01/2016  . Sinus bradycardia 05/30/2016  . Left leg pain 04/02/2016  . Secondary hyperparathyroidism of renal origin ([place]) 03/15/2016  . Abdominal aortic atherosclerosis ([place]) 07/19/2015  . Stool incontinence 06/26/2015  . Urinary incontinence 05/21/2015  . Rib pain on right side 05/21/2015  . Advanced care planning/counseling discussion 03/07/2015  . Paresthesias 09/04/2014  . Alcohol use 09/04/2014  . Ruptured silicone breast implant 05/02/2014  . Hyponatremia 03/25/2014  . Diarrhea 12/12/2013  . Chest pain 03/26/2013  . Bradycardia 08/18/2012  . Syncope and collapse 04/06/2012  . Chronic back pain 03/29/2012  . COPD GOLD III 10/26/2011  . Diastolic dysfunction   . Medicare annual wellness visit, subsequent 12/09/2010  . Dysphagia 07/10/2010  . History of diverticulitis of colon   . HLD (hyperlipidemia)   . Anxiety   . MDD (major depressive disorder) ([place])   . GERD (gastroesophageal  reflux disease)   . IBS (irritable bowel syndrome)   . Allergic rhinitis   . Migraine   . HTN (hypertension)   . SUI (stress urinary incontinence, female)   . History of CVA (cerebrovascular accident) 01/18/2010  . CKD (chronic kidney disease) stage 4, GFR 15-29 ml/min (HCC) 12/01/2009  . Vitamin D deficiency 08/21/2008  . Anemia in chronic kidney disease 08/21/2008  . Osteoporosis 05/19/2007  . Insomnia 05/19/2007    Past Surgical History:  Procedure Laterality Date  . BREAST  ENHANCEMENT SURGERY  1982  . COLONOSCOPY  2002   does not want another! severe diverticulosis with luminal narrowing and spasm Deatra Ina)  . CT HEAD LIMITED W/O CM  04/2010   nothing acute, ? old infarcts L internal capsule  . DEXA  08/2013   Spine -2.2, hip -3.5  . ESOPHAGOGASTRODUODENOSCOPY  2012   WNL, s/p esoph dilation (Dr. Candace Cruise, Jefm Bryant)  . macroplastique implantation  03/2008   Dr. Rogers Blocker  . renal doppler  2009?   simple cysts, wnl  . submucous resection  1962  . TONSILLECTOMY  1942  . TOTAL ABDOMINAL HYSTERECTOMY  1996   for frequent dysmennorhea  . TUBAL LIGATION  1973  . uretherolysis and removal of macroplastique  09/13/08   Dr. Amalia Hailey  . US ECHOCARDIOGRAPHY  11/2010   EF 55-60%, mild LAE, mild diastolic dysfunction, normal valves    OB History   None      Home Medications    Prior to Admission medications   Medication Sig Start Date End Date Taking? Authorizing Provider  acetaminophen (TYLENOL) 500 MG tablet Take 1,000 mg by mouth 2 (two) times daily as needed (pain).     [provider]  aspirin EC 81 MG tablet Take 81 mg by mouth daily.    [provider]  bisoprolol (ZEBETA) 5 MG tablet Take 0.5 tablets (2.5 mg total) by mouth daily. 07/13/17   Ria Bush, MD  calcitRIOL (ROCALTROL) 0.25 MCG capsule Take 0.25 mcg by mouth 3 (three) times a week.     [provider]  donepezil (ARICEPT) 5 MG tablet Take 1 tablet (5 mg total) by mouth at bedtime. 08/03/17   Ria Bush, MD  furosemide (LASIX) 20 MG tablet Take 1 tablet (20 mg total) by mouth daily. 07/13/17   Ria Bush, MD  gabapentin (NEURONTIN) 100 MG capsule TAKE ONE CAPSULE BY MOUTH TWICE A DAY 07/11/17   Ria Bush, MD  metoprolol succinate (TOPROL-XL) 25 MG 24 hr tablet Take 0.5 tablets (12.5 mg total) by mouth daily. 07/13/17   Ria Bush, MD  ondansetron (ZOFRAN) 4 MG tablet Take 1 tablet (4 mg total) by mouth every 6 (six) hours. 06/16/17   Virgel Manifold,  MD  Probiotic Product (VSL#3) CAPS TAKE 1 CAPSULE BY MOUTH DAILY. 06/14/17   Mauri Pole, MD  traMADol (ULTRAM) 50 MG tablet TAKE 1 TABLET BY MOUTH TWICE A DAY AS NEEDED. NEED OFFICE VISIT 07/29/17   Ria Bush, MD    Family History Family History  Problem Relation Age of Onset  . Heart failure Father   . Hypertension Father   . Coronary artery disease Father   . Prostate cancer Father   . Leukemia Mother        CLL prior  . Breast cancer Maternal Aunt   . Breast cancer Paternal Aunt   . Colon cancer Neg Hx   . Esophageal cancer Neg Hx   . Stomach cancer Neg Hx   . Pancreatic cancer  Neg Hx   . Liver disease Neg Hx     Social History Social History   Tobacco Use  . Smoking status: Former Smoker    Packs/day: 0.50    Years: 54.00    Pack years: 27.00    Types: Cigarettes    Start date: 01/18/1954    Last attempt to quit: 01/19/2008    Years since quitting: 9.5  . Smokeless tobacco: Never Used  Substance Use Topics  . Alcohol use: Yes    Alcohol/week: 0.0 oz    Comment: 1 1/2-2 glasses white wine/day  . Drug use: No     Allergies   Amlodipine; Doxycycline monohydrate; and Ramipril   Review of Systems Review of Systems  Constitutional: Negative for fever.  Respiratory: Negative for shortness of breath.   Cardiovascular: Negative for chest pain.  Gastrointestinal: Negative for abdominal pain, diarrhea, nausea and vomiting.  Genitourinary: Positive for dysuria. Negative for flank pain, genital sores, hematuria, menstrual problem, vaginal bleeding, vaginal discharge and vaginal pain.  Musculoskeletal: Negative for back pain.  Skin: Negative for rash.  Neurological: Negative for dizziness, light-headedness and headaches.     Physical Exam Triage Vital Signs ED Triage Vitals  Enc Vitals Group     BP 08/18/17 1517 (!) 146/76     Pulse Rate 08/18/17 1517 60     Resp 08/18/17 1517 19     Temp 08/18/17 1517 97.7 F (36.5 C)     Temp Source 08/18/17  1517 Oral     SpO2 08/18/17 1517 95 %     Weight --      Height --      Head Circumference --      Peak Flow --      Pain Score 08/18/17 1518 0     Pain Loc --      Pain Edu? --      Excl. in [place]? --    No data found.  Updated Vital Signs BP (!) 146/76 (BP Location: Left Arm)   Pulse 60   Temp 97.7 F (36.5 C) (Oral)   Resp 19   SpO2 95%   Visual Acuity Right Eye Distance:   Left Eye Distance:   Bilateral Distance:    Right Eye Near:   Left Eye Near:    Bilateral Near:     Physical Exam  Constitutional: She is oriented to person, place, and time. She appears well-developed and well-nourished.  No acute distress  HENT:  Head: Normocephalic and atraumatic.  Nose: Nose normal.  Eyes: Conjunctivae are normal.  Neck: Neck supple.  Cardiovascular: Normal rate and normal heart sounds.  Pulmonary/Chest: Effort normal and breath sounds normal. No respiratory distress.  Abdominal: She exhibits no distension.  Nontender to palpation throughout abdomen, negative CVA tenderness  Musculoskeletal: Normal range of motion.  Neurological: She is alert and oriented to person, place, and time.  Skin: Skin is warm and dry.  Psychiatric: She has a normal mood and affect.  Nursing note and vitals reviewed.    UC Treatments / Results  Labs (all labs ordered are listed, but only abnormal results are displayed) Labs Reviewed  POCT URINALYSIS DIP (DEVICE) - Abnormal; Notable for the following components:      Result Value   Protein, ur 100 (*)    All other components within normal limits    EKG None  Radiology No results found.  Procedures Procedures (including critical care time)  Medications Ordered in UC Medications - No data to display  Initial Impression / Assessment and Plan / UC Course  I have reviewed the triage vital signs and the nursing notes.  Pertinent labs & imaging results that were available during my care of the patient were reviewed by me and  considered in my medical decision making (see chart for details).     UA with negative nitrites and leuks, no signs of UTI.  Urine otherwise normal besides protein, likely from her CKD.  Recommended continuing to drink plenty of fluids to get urine to be clear to light yellow, advised patient if she is having vaginal dryness to try coconut oil intravaginally at bedtime.  Otherwise follow-up if symptoms worsening, not improving or developing fever, nausea, vomiting, abdominal pain.Discussed strict return precautions. Patient verbalized understanding and is agreeable with plan.  Final Clinical Impressions(s) / UC Diagnoses   Final diagnoses:  Dysuria     Discharge Instructions     No sign of infection in urine Please continue to drink plenty of fluids If having vaginal dryness/irritation may try a small amount of coconut oil intravaginally.  Please follow up if symptoms worsening, developing fever, nausea, vomiting, abdominal pain or worsening burning with urination    ED Prescriptions    None     Controlled Substance Prescriptions  Controlled Substance Registry consulted? Not Applicable   Janith Lima, [place] 08/18/17 1609

## 2017-08-19 DIAGNOSIS — M6281 Muscle weakness (generalized): Secondary | ICD-10-CM | POA: Diagnosis not present

## 2017-08-19 DIAGNOSIS — R2681 Unsteadiness on feet: Secondary | ICD-10-CM | POA: Diagnosis not present

## 2017-08-19 DIAGNOSIS — R41841 Cognitive communication deficit: Secondary | ICD-10-CM | POA: Diagnosis not present

## 2017-08-19 DIAGNOSIS — Z9181 History of falling: Secondary | ICD-10-CM | POA: Diagnosis not present

## 2017-08-19 DIAGNOSIS — R488 Other symbolic dysfunctions: Secondary | ICD-10-CM | POA: Diagnosis not present

## 2017-08-22 DIAGNOSIS — M6281 Muscle weakness (generalized): Secondary | ICD-10-CM | POA: Diagnosis not present

## 2017-08-22 DIAGNOSIS — R2681 Unsteadiness on feet: Secondary | ICD-10-CM | POA: Diagnosis not present

## 2017-08-22 DIAGNOSIS — R41841 Cognitive communication deficit: Secondary | ICD-10-CM | POA: Diagnosis not present

## 2017-08-22 DIAGNOSIS — R488 Other symbolic dysfunctions: Secondary | ICD-10-CM | POA: Diagnosis not present

## 2017-08-22 DIAGNOSIS — Z9181 History of falling: Secondary | ICD-10-CM | POA: Diagnosis not present

## 2017-08-23 DIAGNOSIS — R2681 Unsteadiness on feet: Secondary | ICD-10-CM | POA: Diagnosis not present

## 2017-08-23 DIAGNOSIS — M6281 Muscle weakness (generalized): Secondary | ICD-10-CM | POA: Diagnosis not present

## 2017-08-23 DIAGNOSIS — Z9181 History of falling: Secondary | ICD-10-CM | POA: Diagnosis not present

## 2017-08-23 DIAGNOSIS — R488 Other symbolic dysfunctions: Secondary | ICD-10-CM | POA: Diagnosis not present

## 2017-08-23 DIAGNOSIS — R41841 Cognitive communication deficit: Secondary | ICD-10-CM | POA: Diagnosis not present

## 2017-08-24 DIAGNOSIS — R41841 Cognitive communication deficit: Secondary | ICD-10-CM | POA: Diagnosis not present

## 2017-08-24 DIAGNOSIS — M6281 Muscle weakness (generalized): Secondary | ICD-10-CM | POA: Diagnosis not present

## 2017-08-24 DIAGNOSIS — Z9181 History of falling: Secondary | ICD-10-CM | POA: Diagnosis not present

## 2017-08-24 DIAGNOSIS — R2681 Unsteadiness on feet: Secondary | ICD-10-CM | POA: Diagnosis not present

## 2017-08-24 DIAGNOSIS — R488 Other symbolic dysfunctions: Secondary | ICD-10-CM | POA: Diagnosis not present

## 2017-08-25 DIAGNOSIS — Z9181 History of falling: Secondary | ICD-10-CM | POA: Diagnosis not present

## 2017-08-25 DIAGNOSIS — R2681 Unsteadiness on feet: Secondary | ICD-10-CM | POA: Diagnosis not present

## 2017-08-25 DIAGNOSIS — R41841 Cognitive communication deficit: Secondary | ICD-10-CM | POA: Diagnosis not present

## 2017-08-25 DIAGNOSIS — R488 Other symbolic dysfunctions: Secondary | ICD-10-CM | POA: Diagnosis not present

## 2017-08-25 DIAGNOSIS — M6281 Muscle weakness (generalized): Secondary | ICD-10-CM | POA: Diagnosis not present

## 2017-08-29 DIAGNOSIS — Z9181 History of falling: Secondary | ICD-10-CM | POA: Diagnosis not present

## 2017-08-29 DIAGNOSIS — R488 Other symbolic dysfunctions: Secondary | ICD-10-CM | POA: Diagnosis not present

## 2017-08-29 DIAGNOSIS — R2681 Unsteadiness on feet: Secondary | ICD-10-CM | POA: Diagnosis not present

## 2017-08-29 DIAGNOSIS — M6281 Muscle weakness (generalized): Secondary | ICD-10-CM | POA: Diagnosis not present

## 2017-08-29 DIAGNOSIS — R41841 Cognitive communication deficit: Secondary | ICD-10-CM | POA: Diagnosis not present

## 2017-08-30 DIAGNOSIS — R488 Other symbolic dysfunctions: Secondary | ICD-10-CM | POA: Diagnosis not present

## 2017-08-30 DIAGNOSIS — R2681 Unsteadiness on feet: Secondary | ICD-10-CM | POA: Diagnosis not present

## 2017-08-30 DIAGNOSIS — Z9181 History of falling: Secondary | ICD-10-CM | POA: Diagnosis not present

## 2017-08-30 DIAGNOSIS — R41841 Cognitive communication deficit: Secondary | ICD-10-CM | POA: Diagnosis not present

## 2017-08-30 DIAGNOSIS — M6281 Muscle weakness (generalized): Secondary | ICD-10-CM | POA: Diagnosis not present

## 2017-08-31 DIAGNOSIS — M6281 Muscle weakness (generalized): Secondary | ICD-10-CM | POA: Diagnosis not present

## 2017-08-31 DIAGNOSIS — R41841 Cognitive communication deficit: Secondary | ICD-10-CM | POA: Diagnosis not present

## 2017-08-31 DIAGNOSIS — R2681 Unsteadiness on feet: Secondary | ICD-10-CM | POA: Diagnosis not present

## 2017-08-31 DIAGNOSIS — Z9181 History of falling: Secondary | ICD-10-CM | POA: Diagnosis not present

## 2017-08-31 DIAGNOSIS — R488 Other symbolic dysfunctions: Secondary | ICD-10-CM | POA: Diagnosis not present

## 2017-09-01 ENCOUNTER — Ambulatory Visit (INDEPENDENT_AMBULATORY_CARE_PROVIDER_SITE_OTHER): Payer: Medicare Other | Admitting: Neurology

## 2017-09-01 ENCOUNTER — Encounter: Payer: Self-pay | Admitting: Neurology

## 2017-09-01 VITALS — BP 160/94 | HR 77 | Ht 61.0 in

## 2017-09-01 DIAGNOSIS — G3184 Mild cognitive impairment, so stated: Secondary | ICD-10-CM | POA: Insufficient documentation

## 2017-09-01 MED ORDER — MEMANTINE HCL 10 MG PO TABS: 10.0000 mg | ORAL_TABLET | Freq: Two times a day (BID) | ORAL | 11 refills | Status: DC

## 2017-09-01 MED ORDER — DONEPEZIL HCL 10 MG PO TABS: 10.0000 mg | ORAL_TABLET | Freq: Every day | ORAL | 4 refills | Status: AC
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
## 2017-09-02 ENCOUNTER — Telehealth: Payer: Self-pay | Admitting: Family Medicine

## 2017-09-02 ENCOUNTER — Telehealth: Payer: Self-pay | Admitting: *Deleted

## 2017-09-02 DIAGNOSIS — R2681 Unsteadiness on feet: Secondary | ICD-10-CM | POA: Diagnosis not present

## 2017-09-02 DIAGNOSIS — Z9181 History of falling: Secondary | ICD-10-CM | POA: Diagnosis not present

## 2017-09-02 DIAGNOSIS — R41841 Cognitive communication deficit: Secondary | ICD-10-CM | POA: Diagnosis not present

## 2017-09-02 DIAGNOSIS — M6281 Muscle weakness (generalized): Secondary | ICD-10-CM | POA: Diagnosis not present

## 2017-09-02 DIAGNOSIS — R488 Other symbolic dysfunctions: Secondary | ICD-10-CM | POA: Diagnosis not present

## 2017-09-02 LAB — RPR: RPR Ser Ql: NONREACTIVE

## 2017-09-02 LAB — VITAMIN B12: VITAMIN B 12: 547 pg/mL (ref 232–1245)

## 2017-09-02 LAB — TSH: TSH: 2.06 u[IU]/mL (ref 0.450–4.500)

## 2017-09-02 NOTE — Telephone Encounter (Signed)
Spoke with [facility] relaying Dr. Darnell Level' s message and instructions.  Verbalizes understanding and will call back with a response.

## 2017-09-02 NOTE — Telephone Encounter (Signed)
Copied from [place] [id number]. Topic: General - Other >> Sep 02, 2017  3:16 PM [place], Valda Favia wrote: Van Clines from [facility] is calling in to advise Dr Danise [name] that the pts BP is running high of 170/95 and she is only taking 1/2mg  of metoprolol a day   Cb# [phone]

## 2017-09-02 NOTE — Telephone Encounter (Signed)
-----   Message from Marcial Pacas, MD sent at 09/02/2017  8:07 AM EDT ----- Please call patient for normal laboratory result

## 2017-09-02 NOTE — Telephone Encounter (Signed)
Pt currently taking lasix 20mg  daily and metoprolol succinate 12.5mg  daily.  Would have them check her blood pressures standing and let me know if standing blood pressures are running high.

## 2017-09-02 NOTE — Telephone Encounter (Signed)
Per med list it is 0.5 tab or 12.5 mg of metoprolol.

## 2017-09-02 NOTE — Telephone Encounter (Signed)
Spoke with Pam (on hippa) and reviewed below lab results. She verbalized understanding of same/fim

## 2017-09-05 DIAGNOSIS — Z9181 History of falling: Secondary | ICD-10-CM | POA: Diagnosis not present

## 2017-09-05 DIAGNOSIS — M6281 Muscle weakness (generalized): Secondary | ICD-10-CM | POA: Diagnosis not present

## 2017-09-05 DIAGNOSIS — R488 Other symbolic dysfunctions: Secondary | ICD-10-CM | POA: Diagnosis not present

## 2017-09-05 DIAGNOSIS — R2681 Unsteadiness on feet: Secondary | ICD-10-CM | POA: Diagnosis not present

## 2017-09-05 DIAGNOSIS — R41841 Cognitive communication deficit: Secondary | ICD-10-CM | POA: Diagnosis not present

## 2017-09-06 DIAGNOSIS — R41841 Cognitive communication deficit: Secondary | ICD-10-CM | POA: Diagnosis not present

## 2017-09-06 DIAGNOSIS — M6281 Muscle weakness (generalized): Secondary | ICD-10-CM | POA: Diagnosis not present

## 2017-09-06 DIAGNOSIS — Z9181 History of falling: Secondary | ICD-10-CM | POA: Diagnosis not present

## 2017-09-06 DIAGNOSIS — R2681 Unsteadiness on feet: Secondary | ICD-10-CM | POA: Diagnosis not present

## 2017-09-06 DIAGNOSIS — R488 Other symbolic dysfunctions: Secondary | ICD-10-CM | POA: Diagnosis not present

## 2017-09-07 DIAGNOSIS — R488 Other symbolic dysfunctions: Secondary | ICD-10-CM | POA: Diagnosis not present

## 2017-09-07 DIAGNOSIS — R41841 Cognitive communication deficit: Secondary | ICD-10-CM | POA: Diagnosis not present

## 2017-09-07 DIAGNOSIS — M6281 Muscle weakness (generalized): Secondary | ICD-10-CM | POA: Diagnosis not present

## 2017-09-07 DIAGNOSIS — R2681 Unsteadiness on feet: Secondary | ICD-10-CM | POA: Diagnosis not present

## 2017-09-07 DIAGNOSIS — Z9181 History of falling: Secondary | ICD-10-CM | POA: Diagnosis not present

## 2017-09-08 DIAGNOSIS — R488 Other symbolic dysfunctions: Secondary | ICD-10-CM | POA: Diagnosis not present

## 2017-09-08 DIAGNOSIS — Z9181 History of falling: Secondary | ICD-10-CM | POA: Diagnosis not present

## 2017-09-08 DIAGNOSIS — R2681 Unsteadiness on feet: Secondary | ICD-10-CM | POA: Diagnosis not present

## 2017-09-08 DIAGNOSIS — M6281 Muscle weakness (generalized): Secondary | ICD-10-CM | POA: Diagnosis not present

## 2017-09-08 DIAGNOSIS — R41841 Cognitive communication deficit: Secondary | ICD-10-CM | POA: Diagnosis not present

## 2017-09-09 DIAGNOSIS — R488 Other symbolic dysfunctions: Secondary | ICD-10-CM | POA: Diagnosis not present

## 2017-09-09 DIAGNOSIS — R41841 Cognitive communication deficit: Secondary | ICD-10-CM | POA: Diagnosis not present

## 2017-09-09 DIAGNOSIS — Z9181 History of falling: Secondary | ICD-10-CM | POA: Diagnosis not present

## 2017-09-09 DIAGNOSIS — M6281 Muscle weakness (generalized): Secondary | ICD-10-CM | POA: Diagnosis not present

## 2017-09-09 DIAGNOSIS — R2681 Unsteadiness on feet: Secondary | ICD-10-CM | POA: Diagnosis not present

## 2017-09-13 DIAGNOSIS — Z9181 History of falling: Secondary | ICD-10-CM | POA: Diagnosis not present

## 2017-09-13 DIAGNOSIS — R41841 Cognitive communication deficit: Secondary | ICD-10-CM | POA: Diagnosis not present

## 2017-09-13 DIAGNOSIS — R2681 Unsteadiness on feet: Secondary | ICD-10-CM | POA: Diagnosis not present

## 2017-09-13 DIAGNOSIS — R488 Other symbolic dysfunctions: Secondary | ICD-10-CM | POA: Diagnosis not present

## 2017-09-13 DIAGNOSIS — M6281 Muscle weakness (generalized): Secondary | ICD-10-CM | POA: Diagnosis not present

## 2017-09-14 DIAGNOSIS — R2681 Unsteadiness on feet: Secondary | ICD-10-CM | POA: Diagnosis not present

## 2017-09-14 DIAGNOSIS — Z9181 History of falling: Secondary | ICD-10-CM | POA: Diagnosis not present

## 2017-09-14 DIAGNOSIS — M6281 Muscle weakness (generalized): Secondary | ICD-10-CM | POA: Diagnosis not present

## 2017-09-14 DIAGNOSIS — R488 Other symbolic dysfunctions: Secondary | ICD-10-CM | POA: Diagnosis not present

## 2017-09-14 DIAGNOSIS — R41841 Cognitive communication deficit: Secondary | ICD-10-CM | POA: Diagnosis not present

## 2017-09-15 ENCOUNTER — Other Ambulatory Visit: Payer: Self-pay | Admitting: Family Medicine

## 2017-09-15 DIAGNOSIS — R488 Other symbolic dysfunctions: Secondary | ICD-10-CM | POA: Diagnosis not present

## 2017-09-15 DIAGNOSIS — R41841 Cognitive communication deficit: Secondary | ICD-10-CM | POA: Diagnosis not present

## 2017-09-15 DIAGNOSIS — M6281 Muscle weakness (generalized): Secondary | ICD-10-CM | POA: Diagnosis not present

## 2017-09-15 DIAGNOSIS — Z9181 History of falling: Secondary | ICD-10-CM | POA: Diagnosis not present

## 2017-09-15 DIAGNOSIS — R2681 Unsteadiness on feet: Secondary | ICD-10-CM | POA: Diagnosis not present

## 2017-09-15 NOTE — Telephone Encounter (Signed)
Name of Medication: Tramadol Name of Pharmacy: [facility] [place] or Written Date and Quantity: 07/29/17, #50 Last Office Visit and Type: 08/03/17, f/u Next Office Visit and Type: 11/23/17, CPE Last Controlled Substance Agreement Date: 01/08/14 Last UDS: 09/03/14

## 2017-09-15 NOTE — Telephone Encounter (Signed)
Eprescribed.

## 2017-09-16 DIAGNOSIS — R41841 Cognitive communication deficit: Secondary | ICD-10-CM | POA: Diagnosis not present

## 2017-09-16 DIAGNOSIS — M6281 Muscle weakness (generalized): Secondary | ICD-10-CM | POA: Diagnosis not present

## 2017-09-16 DIAGNOSIS — R488 Other symbolic dysfunctions: Secondary | ICD-10-CM | POA: Diagnosis not present

## 2017-09-16 DIAGNOSIS — Z9181 History of falling: Secondary | ICD-10-CM | POA: Diagnosis not present

## 2017-09-16 DIAGNOSIS — R2681 Unsteadiness on feet: Secondary | ICD-10-CM | POA: Diagnosis not present

## 2017-09-19 DIAGNOSIS — R41841 Cognitive communication deficit: Secondary | ICD-10-CM | POA: Diagnosis not present

## 2017-09-19 DIAGNOSIS — R2681 Unsteadiness on feet: Secondary | ICD-10-CM | POA: Diagnosis not present

## 2017-09-19 DIAGNOSIS — Z9181 History of falling: Secondary | ICD-10-CM | POA: Diagnosis not present

## 2017-09-19 DIAGNOSIS — R488 Other symbolic dysfunctions: Secondary | ICD-10-CM | POA: Diagnosis not present

## 2017-09-19 DIAGNOSIS — M6281 Muscle weakness (generalized): Secondary | ICD-10-CM | POA: Diagnosis not present

## 2017-09-20 DIAGNOSIS — M6281 Muscle weakness (generalized): Secondary | ICD-10-CM | POA: Diagnosis not present

## 2017-09-20 DIAGNOSIS — Z9181 History of falling: Secondary | ICD-10-CM | POA: Diagnosis not present

## 2017-09-20 DIAGNOSIS — R2681 Unsteadiness on feet: Secondary | ICD-10-CM | POA: Diagnosis not present

## 2017-09-20 DIAGNOSIS — R41841 Cognitive communication deficit: Secondary | ICD-10-CM | POA: Diagnosis not present

## 2017-09-20 DIAGNOSIS — R488 Other symbolic dysfunctions: Secondary | ICD-10-CM | POA: Diagnosis not present

## 2017-09-21 DIAGNOSIS — R488 Other symbolic dysfunctions: Secondary | ICD-10-CM | POA: Diagnosis not present

## 2017-09-21 DIAGNOSIS — R41841 Cognitive communication deficit: Secondary | ICD-10-CM | POA: Diagnosis not present

## 2017-09-21 DIAGNOSIS — Z9181 History of falling: Secondary | ICD-10-CM | POA: Diagnosis not present

## 2017-09-21 DIAGNOSIS — M6281 Muscle weakness (generalized): Secondary | ICD-10-CM | POA: Diagnosis not present

## 2017-09-21 DIAGNOSIS — R2681 Unsteadiness on feet: Secondary | ICD-10-CM | POA: Diagnosis not present

## 2017-09-26 DIAGNOSIS — R488 Other symbolic dysfunctions: Secondary | ICD-10-CM | POA: Diagnosis not present

## 2017-09-26 DIAGNOSIS — Z9181 History of falling: Secondary | ICD-10-CM | POA: Diagnosis not present

## 2017-09-26 DIAGNOSIS — R2681 Unsteadiness on feet: Secondary | ICD-10-CM | POA: Diagnosis not present

## 2017-09-26 DIAGNOSIS — R41841 Cognitive communication deficit: Secondary | ICD-10-CM | POA: Diagnosis not present

## 2017-09-26 DIAGNOSIS — M6281 Muscle weakness (generalized): Secondary | ICD-10-CM | POA: Diagnosis not present

## 2017-09-27 DIAGNOSIS — R2681 Unsteadiness on feet: Secondary | ICD-10-CM | POA: Diagnosis not present

## 2017-09-27 DIAGNOSIS — R488 Other symbolic dysfunctions: Secondary | ICD-10-CM | POA: Diagnosis not present

## 2017-09-27 DIAGNOSIS — Z9181 History of falling: Secondary | ICD-10-CM | POA: Diagnosis not present

## 2017-09-27 DIAGNOSIS — M6281 Muscle weakness (generalized): Secondary | ICD-10-CM | POA: Diagnosis not present

## 2017-09-27 DIAGNOSIS — R41841 Cognitive communication deficit: Secondary | ICD-10-CM | POA: Diagnosis not present

## 2017-09-28 DIAGNOSIS — R2681 Unsteadiness on feet: Secondary | ICD-10-CM | POA: Diagnosis not present

## 2017-09-28 DIAGNOSIS — M6281 Muscle weakness (generalized): Secondary | ICD-10-CM | POA: Diagnosis not present

## 2017-09-28 DIAGNOSIS — R41841 Cognitive communication deficit: Secondary | ICD-10-CM | POA: Diagnosis not present

## 2017-09-28 DIAGNOSIS — Z9181 History of falling: Secondary | ICD-10-CM | POA: Diagnosis not present

## 2017-09-28 DIAGNOSIS — R488 Other symbolic dysfunctions: Secondary | ICD-10-CM | POA: Diagnosis not present

## 2017-10-09 ENCOUNTER — Other Ambulatory Visit: Payer: Self-pay | Admitting: Family Medicine

## 2017-10-10 NOTE — Telephone Encounter (Signed)
Gabapentin Last filled:  09/11/17, #60 Last OV:  08/03/17 Next OV:  11/23/17, CPE

## 2017-10-14 ENCOUNTER — Other Ambulatory Visit: Payer: Self-pay | Admitting: Family Medicine

## 2017-10-14 NOTE — Telephone Encounter (Signed)
Name of Medication: Tramadol Name of Pharmacy: [facility] Last Fill or Written Date and Quantity: 09/16/17, #50 Last Office Visit and Type: 08/03/17, acute Next Office Visit and Type: 11/23/17, CPE Last Controlled Substance Agreement Date: 01/08/14 Last UDS: 09/03/14

## 2017-10-16 NOTE — Telephone Encounter (Signed)
Eprescribed.

## 2017-10-25 ENCOUNTER — Telehealth: Payer: Self-pay | Admitting: Family Medicine

## 2017-10-25 ENCOUNTER — Other Ambulatory Visit: Payer: Self-pay | Admitting: Neurology

## 2017-10-25 NOTE — Telephone Encounter (Signed)
Nicole English dropped off physician statement form to be filled out. Placed in rx tower

## 2017-10-25 NOTE — Telephone Encounter (Signed)
Placed form in Dr. G's box.  

## 2017-10-29 DIAGNOSIS — Z0279 Encounter for issue of other medical certificate: Secondary | ICD-10-CM

## 2017-10-29 NOTE — Telephone Encounter (Signed)
Filled and in Lisa's box 

## 2017-10-31 NOTE — Telephone Encounter (Signed)
Spoke with pt's daughter-in-law, Jeannene Patella (on dpr), notifying her form is ready to pick up.  Also, informed her there is a $20.00 fee to have form completed and pt will be billed. Verbalizes understanding.  Says she will pick up tomorrow, 11/01/17.  [Placed form at front office. Also, made 1 copy to scan and 1 for billing.]

## 2017-10-31 NOTE — Telephone Encounter (Signed)
Nicole English pt's daughter in law is checking on status of insurance form that she dropped off on 10/25/17 to be completted  Is ready   Best number [phone]

## 2017-11-14 ENCOUNTER — Other Ambulatory Visit: Payer: Self-pay | Admitting: Family Medicine

## 2017-11-14 DIAGNOSIS — E785 Hyperlipidemia, unspecified: Secondary | ICD-10-CM

## 2017-11-14 DIAGNOSIS — N2581 Secondary hyperparathyroidism of renal origin: Secondary | ICD-10-CM

## 2017-11-14 DIAGNOSIS — E559 Vitamin D deficiency, unspecified: Secondary | ICD-10-CM

## 2017-11-14 DIAGNOSIS — N184 Chronic kidney disease, stage 4 (severe): Secondary | ICD-10-CM

## 2017-11-15 ENCOUNTER — Other Ambulatory Visit: Payer: Self-pay | Admitting: Family Medicine

## 2017-11-15 NOTE — Telephone Encounter (Signed)
Eprescribed.

## 2017-11-15 NOTE — Telephone Encounter (Signed)
Name of Medication:Name of Pharmacy: [facility] or Written Date and Quantity: 10/16/17 #50 Last Office Visit and Type:  08/03/17 Next Office Visit and Type:11/16/17 AWV  Last Controlled Substance Agreement Date: 01/08/14 Last UDS:09/03/14

## 2017-11-16 ENCOUNTER — Ambulatory Visit: Payer: Medicare Other

## 2017-11-23 ENCOUNTER — Encounter: Payer: Medicare Other | Admitting: Family Medicine

## 2017-11-23 DIAGNOSIS — Z0289 Encounter for other administrative examinations: Secondary | ICD-10-CM

## 2017-11-23 NOTE — Progress Notes (Deleted)
There were no vitals taken for this visit.   CC: AMW  Subjective:    Patient ID: Nicole English, female    DOB: 10-20-1932, 84 y.o.   MRN: [id number]  HPI: Nicole English is a 84 y.o. female presenting on 11/23/2017 for No chief complaint on file.   Appt with Katha Cabal cancelled last week.   Preventative: COLONOSCOPY Date: 2002 does not want another! Aged out Mammogram - 05/2015 Birads 1 Well woman: s/p hysterectomy 1996 for dysmenorrhea. Ovaries removed as well.  DEXA Date: 08/2013 Spine -2.2, hip -3.5. Poor calcium in diet. She does have regular weight bearing exercise - uses stairs Flu yearly  Pneumovax 05/2007, prevnar 2015 Tdap 11/2010 Zostavax 06/2009 Advanced directives: needs to set up. Packet provided 2015. Has 3 sons locally. DIL working on this. Asked to bring copy when complete. Seat belt use discussed Sunscreen use discussed. No changing moles on skin. 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 Interim medical history since our last visit reviewed. Allergies and medications reviewed and updated. Outpatient Medications Prior to Visit  Medication Sig Dispense Refill  . acetaminophen (TYLENOL) 500 MG tablet Take 1,000 mg by mouth 2 (two) times daily as needed (pain).     Marland Kitchen aspirin EC 81 MG tablet Take 81 mg by mouth daily.    . calcitRIOL (ROCALTROL) 0.25 MCG capsule Take 0.25 mcg by mouth 3 (three) times a week.     . donepezil (ARICEPT) 10 MG tablet Take 1 tablet (10 mg total) by mouth at bedtime. 90 tablet 4  . furosemide (LASIX) 20 MG tablet Take 1 tablet (20 mg total) by mouth daily. 90 tablet 1  . gabapentin  (NEURONTIN) 100 MG capsule TAKE ONE CAPSULE BY MOUTH TWICE A DAY 60 capsule 2  . memantine (NAMENDA) 10 MG tablet TAKE 1 TABLET BY MOUTH TWICE A DAY 180 tablet 3  . metoprolol succinate (TOPROL-XL) 25 MG 24 hr tablet TAKE 0.5 TABLETS (12.5 MG TOTAL) BY MOUTH DAILY. *IN PLACE OF BISOPROLOL 45 tablet 0  . SODIUM BICARBONATE PO Take 1 tablet by mouth 2 (two) times daily.    . traMADol (ULTRAM) 50 MG tablet TAKE 1 TABLET BY MOUTH TWICE A DAY AS NEEDED. NEED OFFICE VISIT 50 tablet 0   No facility-administered medications prior to visit.      Per HPI unless specifically indicated in ROS section below Review of Systems     Objective:    There were no vitals taken for this visit.  Wt Readings from Last 3 Encounters:  08/03/17 107 lb 4 oz (48.6 kg)  07/13/17 106 lb 4 oz (48.2 kg)  06/20/17 108 lb (49 kg)    Physical Exam  Constitutional: She is oriented to person, place, and time. She appears well-developed and well-nourished. No distress.  HENT:  Head: Normocephalic and atraumatic.  Right Ear: Hearing, tympanic membrane, external ear and ear canal normal.  Left Ear: Hearing, tympanic membrane, external ear and ear canal normal.  Nose: Nose normal.  Mouth/Throat: Uvula is midline, oropharynx is clear and moist and mucous membranes are normal. No oropharyngeal exudate, posterior oropharyngeal edema or posterior oropharyngeal erythema.  Eyes: Pupils are equal, round, and reactive  to light. Conjunctivae and EOM are normal. No scleral icterus.  Neck: Normal range of motion. Neck supple.  Cardiovascular: Normal rate, regular rhythm, normal heart sounds and intact distal pulses.  No murmur heard. Pulses:      Radial pulses are 2+ on the right side, and 2+ on the left side.  Pulmonary/Chest: Effort normal and breath sounds normal. No respiratory distress. She has no wheezes. She has no rales.  Abdominal: Soft. Bowel sounds are normal. She exhibits no distension and no mass. There is no  tenderness. There is no rebound and no guarding.  Musculoskeletal: Normal range of motion. She exhibits no edema.  Lymphadenopathy:    She has no cervical adenopathy.  Neurological: She is alert and oriented to person, place, and time.  CN grossly intact, station and gait intact  Skin: Skin is warm and dry. No rash noted.  Psychiatric: She has a normal mood and affect. Her behavior is normal. Judgment and thought content normal.  Nursing note and vitals reviewed.  Results for orders placed or performed in visit on 09/01/17  TSH  Result Value Ref Range   TSH 2.060 0.450 - 4.500 uIU/mL  Vitamin B12  Result Value Ref Range   Vitamin B-12 547 232 - 1,245 pg/mL  RPR  Result Value Ref Range   RPR Ser Ql Non Reactive Non Reactive      Assessment & Plan:   Problem List Items Addressed This Visit    None       No orders of the defined types were placed in this encounter.  No orders of the defined types were placed in this encounter.   Follow up plan: No follow-ups on file.  Nicole Bush, MD

## 2017-12-12 ENCOUNTER — Other Ambulatory Visit: Payer: Self-pay | Admitting: Family Medicine

## 2017-12-12 NOTE — Telephone Encounter (Signed)
Name of Medication: Tramadol Name of [facility] [address] Last Fill or Written Date and Quantity: 11/15/17. #50/0 Last Office Visit and Type: 08/03/17, f/u Next Office Visit and Type: 01/16/18, CPE Last Controlled Substance Agreement Date: 01/08/14 Last UDS: 09/03/14  Gabapentin Last filled:  12/12/17, #60/2

## 2017-12-13 NOTE — Telephone Encounter (Signed)
Eprescribed.

## 2017-12-19 ENCOUNTER — Telehealth: Payer: Self-pay | Admitting: Family Medicine

## 2017-12-19 NOTE — Telephone Encounter (Signed)
Olin Hauser (pt's daughter-in-law) dropped off a form to be filled out. Placed in [place] tower. She also states that when she visited [place] last, her Metoprolol had gone missing and she's wondering if they can get a refill on it.

## 2017-12-20 DIAGNOSIS — Z0279 Encounter for issue of other medical certificate: Secondary | ICD-10-CM

## 2017-12-20 MED ORDER — METOPROLOL SUCCINATE ER 25 MG PO TB24: ORAL_TABLET | ORAL | 0 refills | Status: AC

## 2017-12-20 NOTE — Telephone Encounter (Signed)
Filled out and in my outbox.

## 2017-12-20 NOTE — Telephone Encounter (Signed)
Patient's daughter-in-law notified form is ready for pick up and $5 charge and I let her know r

## 2017-12-20 NOTE — Telephone Encounter (Signed)
Placed form in Dr. Synthia Innocent box.  E-scribed refill.

## 2017-12-20 NOTE — Telephone Encounter (Signed)
I let daughter-in-law know rx was e-scribed.

## 2018-01-09 ENCOUNTER — Ambulatory Visit: Payer: Medicare Other

## 2018-01-10 ENCOUNTER — Encounter (HOSPITAL_COMMUNITY): Payer: Self-pay

## 2018-01-10 ENCOUNTER — Other Ambulatory Visit: Payer: Self-pay

## 2018-01-10 ENCOUNTER — Emergency Department (HOSPITAL_COMMUNITY): Payer: Medicare Other

## 2018-01-10 ENCOUNTER — Emergency Department (HOSPITAL_COMMUNITY)
Admission: EM | Admit: 2018-01-10 | Discharge: 2018-01-10 | Disposition: A | Discharge status: Home / Self Care | Payer: Medicare Other | Attending: Emergency Medicine | Admitting: Emergency Medicine

## 2018-01-10 DIAGNOSIS — N184 Chronic kidney disease, stage 4 (severe): Secondary | ICD-10-CM | POA: Diagnosis not present

## 2018-01-10 DIAGNOSIS — Y929 Unspecified place or not applicable: Secondary | ICD-10-CM | POA: Diagnosis not present

## 2018-01-10 DIAGNOSIS — S99911A Unspecified injury of right ankle, initial encounter: Secondary | ICD-10-CM | POA: Diagnosis present

## 2018-01-10 DIAGNOSIS — Z87891 Personal history of nicotine dependence: Secondary | ICD-10-CM | POA: Diagnosis not present

## 2018-01-10 DIAGNOSIS — W19XXXA Unspecified fall, initial encounter: Secondary | ICD-10-CM | POA: Diagnosis not present

## 2018-01-10 DIAGNOSIS — R4182 Altered mental status, unspecified: Secondary | ICD-10-CM | POA: Diagnosis not present

## 2018-01-10 DIAGNOSIS — S82839A Other fracture of upper and lower end of unspecified fibula, initial encounter for closed fracture: Secondary | ICD-10-CM | POA: Insufficient documentation

## 2018-01-10 DIAGNOSIS — Z743 Need for continuous supervision: Secondary | ICD-10-CM | POA: Diagnosis not present

## 2018-01-10 DIAGNOSIS — I129 Hypertensive chronic kidney disease with stage 1 through stage 4 chronic kidney disease, or unspecified chronic kidney disease: Secondary | ICD-10-CM | POA: Diagnosis not present

## 2018-01-10 DIAGNOSIS — Y999 Unspecified external cause status: Secondary | ICD-10-CM | POA: Diagnosis not present

## 2018-01-10 DIAGNOSIS — J449 Chronic obstructive pulmonary disease, unspecified: Secondary | ICD-10-CM | POA: Diagnosis not present

## 2018-01-10 DIAGNOSIS — G459 Transient cerebral ischemic attack, unspecified: Secondary | ICD-10-CM | POA: Diagnosis not present

## 2018-01-10 DIAGNOSIS — S82831A Other fracture of upper and lower end of right fibula, initial encounter for closed fracture: Secondary | ICD-10-CM | POA: Diagnosis not present

## 2018-01-10 DIAGNOSIS — R410 Disorientation, unspecified: Secondary | ICD-10-CM | POA: Diagnosis not present

## 2018-01-10 DIAGNOSIS — Y939 Activity, unspecified: Secondary | ICD-10-CM | POA: Insufficient documentation

## 2018-01-10 DIAGNOSIS — I10 Essential (primary) hypertension: Secondary | ICD-10-CM | POA: Diagnosis not present

## 2018-01-10 DIAGNOSIS — R0902 Hypoxemia: Secondary | ICD-10-CM | POA: Diagnosis not present

## 2018-01-10 DIAGNOSIS — S82431A Displaced oblique fracture of shaft of right fibula, initial encounter for closed fracture: Secondary | ICD-10-CM | POA: Diagnosis not present

## 2018-01-10 DIAGNOSIS — R279 Unspecified lack of coordination: Secondary | ICD-10-CM | POA: Diagnosis not present

## 2018-01-10 LAB — URINALYSIS, ROUTINE W REFLEX MICROSCOPIC
Bilirubin Urine: NEGATIVE
Glucose, UA: NEGATIVE mg/dL
Hgb urine dipstick: NEGATIVE
KETONES UR: NEGATIVE mg/dL
Leukocytes, UA: NEGATIVE
Nitrite: NEGATIVE
Protein, ur: 100 mg/dL — AB
Specific Gravity, Urine: 1.009 (ref 1.005–1.030)
pH: 8 (ref 5.0–8.0)

## 2018-01-10 NOTE — ED Notes (Addendum)
Pts son states he has to go home and "will be back to get her in the morning". He says he was talking to [facility] and states "her room is trashed. They don't have enough staff. They say they cant take her tonight"   Dera Vanaken (son) [phone]

## 2018-01-10 NOTE — ED Provider Notes (Signed)
[facility] Emergency Department Provider Note MRN:  [id number]  Arrival date & time: 01/10/18     Chief Complaint   Fall   History of Present Illness   Nicole English is a 84 y.o. year-old female with a history of dementia presenting to the ED with chief complaint of fall.  Fall yesterday at her care facility, noted swollen ankle today.  No head trauma or loss consciousness, no vomiting, no chest pain or shortness of breath, no abdominal pain, no altered mental status.  Report that she is just not been as active today.  Patient with no other complaints.  Review of Systems  A complete 10 system review of systems was obtained and all systems are negative except as noted in the HPI and PMH.   Patient's Health History    Past Medical History:  Diagnosis Date  . Abdominal aortic atherosclerosis ([place]) 07/2015   by CT  . Abnormal drug screen 12/2013   abnormally neg xanax (12/2013) and neg xanax and positive EtOH (04/2014), appropriate (08/2014)  . Allergic rhinitis   . Anxiety   . CKD (chronic kidney disease) stage 4, GFR 15-29 ml/min (HCC) 12/2012   per pt after brown recluse bite. sees Dr Juleen China, off ACEI, bp ok slightly elevated to maintain renal perfusion  . Cognitive decline   . 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 DDD (degenerative disc disease), lumbar   . Depression   . Diastolic dysfunction 8119   per echo in 11/2010 and again 03/2012; normal EF  . Diverticulosis    colonsocopy 2002 aborted 2/2 tortuous colon and heavy sigmoid diverticulosis  . Dysphagia    EGD 2012 - wnl, s/p esoph dilation  . GERD (gastroesophageal reflux disease)   . Glucose intolerance (impaired glucose tolerance)   . History of CVA (cerebrovascular accident) 2012   old per prior head CT  . History of diverticulitis of colon   . HLD (hyperlipidemia)   . HTN (hypertension)    Dr. [place], cards  .  IBS (irritable bowel syndrome)   . Migraine   . Osteoporosis 08/2013   Spine -2.2, hip -3.5  . Rib fractures 10/11/2014  . Right upper lobe pneumonia ([place]) 03/25/2014  . SUI (stress urinary incontinence, female)    Dr. Amalia Hailey, urology (some urge as well)    Past Surgical History:  Procedure Laterality Date  . BREAST ENHANCEMENT SURGERY  1982  . COLONOSCOPY  2002   does not want another! severe diverticulosis with luminal narrowing and spasm Deatra Ina)  . CT HEAD LIMITED W/O CM  04/2010   nothing acute, ? old infarcts L internal capsule  . DEXA  08/2013   Spine -2.2, hip -3.5  . ESOPHAGOGASTRODUODENOSCOPY  2012   WNL, s/p esoph dilation (Dr. Candace Cruise, [facility])  . macroplastique implantation  03/2008   Dr. Rogers Blocker  . renal doppler  2009?   simple cysts, wnl  . submucous resection  1962  . TONSILLECTOMY  1942  . TOTAL ABDOMINAL HYSTERECTOMY  1996   for frequent dysmennorhea  . TUBAL LIGATION  1973  . uretherolysis and removal of macroplastique  09/13/08   Dr. Amalia Hailey  . US ECHOCARDIOGRAPHY  11/2010   EF 55-60%, mild LAE, mild diastolic dysfunction, normal valves    Family History  Problem Relation Age of Onset  . Heart failure Father   . Hypertension Father   . Coronary artery disease Father   .  Prostate cancer Father   . Leukemia Mother        CLL prior  . Breast cancer Maternal Aunt   . Breast cancer Paternal Aunt   . Colon cancer Neg Hx   . Esophageal cancer Neg Hx   . Stomach cancer Neg Hx   . Pancreatic cancer Neg Hx   . Liver disease Neg Hx     Social History   Socioeconomic History  . Marital status: Widowed    Spouse name: Not on file  . Number of children: 3  . Years of education: Not on file  . Highest education level: Some college, no degree  Occupational History  . Occupation: Former Personal assistant  Social Needs  . Financial resource strain: Not on file  . Food insecurity:    Worry: Not on file    Inability: Not on file  . Transportation needs:    Medical: Not  on file    Non-medical: Not on file  Tobacco Use  . Smoking status: Former Smoker    Packs/day: 0.50    Years: 54.00    Pack years: 27.00    Types: Cigarettes    Start date: 01/18/1954    Last attempt to quit: 01/19/2008    Years since quitting: 9.9  . Smokeless tobacco: Never Used  Substance and Sexual Activity  . Alcohol use: Yes    Alcohol/week: 0.0 standard drinks    Comment: rare  . Drug use: No  . Sexual activity: Not Currently  Lifestyle  . Physical activity:    Days per week: Not on file    Minutes per session: Not on file  . Stress: Not on file  Relationships  . Social connections:    Talks on phone: Not on file    Gets together: Not on file    Attends religious service: Not on file    Active member of club or organization: Not on file    Attends meetings of clubs or organizations: Not on file    Relationship status: Not on file  . Intimate partner violence:    Fear of current or ex partner: Not on file    Emotionally abused: Not on file    Physically abused: Not on file    Forced sexual activity: Not on file  Other Topics Concern  . Not on file  Social History Narrative   Lives at [facility] in an apartment (Independent Living).   Right-handed.   Caffeine use:  Occasional caffeine use     Physical Exam  Vital Signs and Nursing Notes reviewed Vitals:   01/10/18 1714 01/10/18 1930  BP: (!) 161/95 (!) 165/93  Pulse: 87 90  Resp: 15 16  Temp: 99 F (37.2 C)   SpO2: 97% 98%    CONSTITUTIONAL: Well-appearing, NAD NEURO:  Alert and oriented x 3, no focal deficits EYES:  eyes equal and reactive ENT/NECK:  no LAD, no JVD CARDIO: Regular rate, well-perfused, normal S1 and S2 PULM:  CTAB no wheezing or rhonchi GI/GU:  normal bowel sounds, non-distended, non-tender MSK/SPINE:  No gross deformities, edema and tenderness to the right ankle SKIN:  no rash, atraumatic PSYCH:  Appropriate speech and behavior  Diagnostic and Interventional Summary    Labs  Reviewed  URINALYSIS, ROUTINE W REFLEX MICROSCOPIC - Abnormal; Notable for the following components:      Result Value   APPearance HAZY (*)    Protein, ur 100 (*)    Bacteria, UA RARE (*)  All other components within normal limits    DG Foot Complete Right  Final Result    DG Ankle Complete Right  Final Result      Medications - No data to display   Procedures Critical Care  ED Course and Medical Decision Making  I have reviewed the triage vital signs and the nursing notes.  Pertinent labs & imaging results that were available during my care of the patient were reviewed by me and considered in my medical decision making (see below for details).  X-rays reveal distal fibular fracture, splinted here.  Will provide orthopedic surgeon contact information.  UA pending.  Patient is otherwise very well-appearing, vital signs stable.  Appropriate for discharge.  After the discussed management above, the patient was determined to be safe for discharge.  The patient was in agreement with this plan and all questions regarding their care were answered.  ED return precautions were discussed and the patient will return to the ED with any significant worsening of condition.  Barth Kirks. Sedonia Small, MD [place] [email or web address]  Final Clinical Impressions(s) / ED Diagnoses     ICD-10-CM   1. Closed fracture of distal end of fibula, unspecified fracture morphology, unspecified laterality, initial encounter S82.839A     ED Discharge Orders    None         Maudie Flakes, MD 01/10/18 2107

## 2018-01-10 NOTE — ED Triage Notes (Signed)
She comes to [place] from [facility] ([place]) with c/o fell yesterday or the day before and has a sore, swollen right ankle/foot. Her family also tell EMS that pt. Is "not as active as usual". She is confused at her baseline. She arrives in no distress.

## 2018-01-10 NOTE — Progress Notes (Signed)
Orthopedic Tech Progress Note Patient Details:  Nicole English 27-Nov-1932 [id number] Pt was only order a stirrup but went ahead and put short leg on with that. Ortho Devices Type of Ortho Device: Ace wrap, Post splint, Short leg splint, Stirrup splint, Post (short) splint Splint Material: Fiberglass Ortho Device/Splint Location: Rt leg Ortho Device/Splint Interventions: Application   Post Interventions Patient Tolerated: Well Instructions Provided: Adjustment of device, Care of device   Ladell Pier [facility] 01/10/2018, 7:27 PM

## 2018-01-10 NOTE — ED Notes (Signed)
Bed: [facility] Expected date:  Expected time:  Means of arrival:  Comments: EMS Fall

## 2018-01-10 NOTE — ED Notes (Signed)
[facility] arrived.  

## 2018-01-10 NOTE — ED Notes (Signed)
Spoke with [facility] staff about patient returning home. They will gladly receive her.

## 2018-01-10 NOTE — Discharge Instructions (Signed)
You were evaluated in the Emergency Department and after careful evaluation, we did not find any emergent condition requiring admission or further testing in the hospital.  Your symptoms today seem to be due to a broken bone in the ankle.  Please call the orthopedic specialist provided as soon as possible to schedule an appointment.  Please return to the Emergency Department if you experience any worsening of your condition.  We encourage you to follow up with a primary care provider.  Thank you for allowing [place] to be a part of your care.

## 2018-01-10 NOTE — ED Notes (Signed)
Bed: [facility] Expected date:  Expected time:  Means of arrival:  Comments: Venita Sheffield

## 2018-01-15 ENCOUNTER — Encounter: Payer: Self-pay | Admitting: Family Medicine

## 2018-01-15 NOTE — Progress Notes (Deleted)
There were no vitals taken for this visit.   CC: AMW f/u visit Subjective:    Patient ID: Nicole English, female    DOB: [birth year], 84 y.o.   MRN: [id number]  HPI: Nicole English is a 84 y.o. female presenting on 01/16/2018 for No chief complaint on file.   Seeing Katha Cabal today for medicare wellness visit, note will be reviewed.   Seen at ER 01/10/2018 after fall at home. Found to have R distal fibular fracture, received splint at ER. rec ortho f/u.   Preventative: COLONOSCOPY Date: 2002 does not want another! Aged out Mammogram - 05/2015 Birads 1 Well woman: s/p hysterectomy 1996 for dysmenorrhea. Ovaries removed as well.  DEXA Date: 08/2013 Spine -2.2, hip -3.5. Poor calcium in diet. She does have regular weight bearing exercise - uses stairs Flu yearly  Pneumovax 05/2007, prevnar 2015 Tdap 11/2010 Zostavax 06/2009 Advanced directives: needs to set up. Packet provided 2015. Has 3 sons locally. DIL working on this. Asked to bring copy when complete. Seat belt use discussed Sunscreen use discussed. No changing moles on skin. Ex smoker - quit 2010 Alcohol - rare  Lives with middle son Has family nearby (3 sons locally) - cares for granddaughter [name]) occasionally Occupation: retired, was Holiday representative, stays involved with Environmental manager Activity: stretching regularly  Diet: good water, some vegetables, follows renal diet       Relevant past medical, surgical, family and social history reviewed and updated as indicated. Interim medical history since our last visit reviewed. Allergies and medications reviewed and updated. Outpatient Medications Prior to Visit  Medication Sig Dispense Refill  . aspirin EC 81 MG tablet Take 81 mg by mouth daily.    . calcitRIOL (ROCALTROL) 0.25 MCG capsule Take 0.25 mcg by mouth 3 (three) times a week.     . donepezil (ARICEPT) 10 MG tablet Take 1 tablet (10 mg total) by mouth at bedtime. (Patient not taking: Reported on 01/10/2018) 90 tablet  4  . furosemide (LASIX) 20 MG tablet Take 1 tablet (20 mg total) by mouth daily. 90 tablet 1  . gabapentin (NEURONTIN) 100 MG capsule TAKE ONE CAPSULE BY MOUTH TWICE A DAY 60 capsule 2  . memantine (NAMENDA) 10 MG tablet TAKE 1 TABLET BY MOUTH TWICE A DAY 180 tablet 3  . metoprolol succinate (TOPROL-XL) 25 MG 24 hr tablet TAKE 0.5 TABLETS (12.5 MG TOTAL) BY MOUTH DAILY. *IN PLACE OF BISOPROLOL 45 tablet 0  . SODIUM BICARBONATE PO Take 1 tablet by mouth 2 (two) times daily.    . traMADol (ULTRAM) 50 MG tablet Take 1 tablet (50 mg total) by mouth every 12 (twelve) hours as needed for moderate pain. 50 tablet 0   No facility-administered medications prior to visit.      Per HPI unless specifically indicated in ROS section below Review of Systems Objective:    There were no vitals taken for this visit.  Wt Readings from Last 3 Encounters:  08/03/17 107 lb 4 oz (48.6 kg)  07/13/17 106 lb 4 oz (48.2 kg)  06/20/17 108 lb (49 kg)    Physical Exam    Results for orders placed or performed during the hospital encounter of 01/10/18  Urinalysis, Routine w reflex microscopic  Result Value Ref Range   Color, Urine YELLOW YELLOW   APPearance HAZY (A) CLEAR   Specific Gravity, Urine 1.009 1.005 - 1.030   pH 8.0 5.0 - 8.0   Glucose, UA NEGATIVE NEGATIVE mg/dL   Hgb urine dipstick  NEGATIVE NEGATIVE   Bilirubin Urine NEGATIVE NEGATIVE   Ketones, ur NEGATIVE NEGATIVE mg/dL   Protein, ur 100 (A) NEGATIVE mg/dL   Nitrite NEGATIVE NEGATIVE   Leukocytes, UA NEGATIVE NEGATIVE   RBC / HPF 0-5 0 - 5 RBC/hpf   WBC, UA 11-20 0 - 5 WBC/hpf   Bacteria, UA RARE (A) NONE SEEN   Squamous Epithelial / LPF 0-5 0 - 5   Mucus PRESENT    Assessment & Plan:   Problem List Items Addressed This Visit    None       No orders of the defined types were placed in this encounter.  No orders of the defined types were placed in this encounter.   Follow up plan: No follow-ups on file.  Ria Bush, MD

## 2018-01-16 ENCOUNTER — Ambulatory Visit: Payer: Medicare Other

## 2018-01-16 ENCOUNTER — Telehealth: Payer: Self-pay | Admitting: Family Medicine

## 2018-01-16 ENCOUNTER — Encounter: Payer: Medicare Other | Admitting: Family Medicine

## 2018-01-16 NOTE — Telephone Encounter (Signed)
Pt's daughter in law Pam (on dpr) called to cancel the physical for today with Dr.G. She stated the pt fractured her foot from a fall and is bedridden. She is also requesting a call from Dr.G's nurse because they are transitioning her into a assisted living. Best cb number [phone]

## 2018-01-16 NOTE — Telephone Encounter (Signed)
I last filled out long term care form ([employer]Qual[employer] Policy Addendum) for continence support only (12/21/2017). Now after fracture may qualify for more - does she need form filled out again? Reviewed ER note - fall at facility without head injury or LOC. Xray foot/ankle showed R distal lateral ankle (fibular) fracture. Blood pressure was high at 160/90s. Recommended ortho f/u for broken ankle.

## 2018-01-16 NOTE — Telephone Encounter (Signed)
Spoke with pt's daughter-in-law, Jeannene Patella.  She is asking for update on pt's long term care that was discussed.  Also, wants to know if Dr. Darnell Level can give her some details of pt's ER visit on 01/10/18. Says she was not with the pt and that pt was only give one printed sheet with her picture on it after the visit.  Says pt is not able to come in right now for an ER f/u visit. Pam gives permission to lvm.

## 2018-01-17 NOTE — Telephone Encounter (Signed)
Spoke with pt's daughter-in-law, Pam, relaying Dr. Synthia Innocent message.  She verbalizes understanding and states she will check to see if another form is needed.  Also, says she will take pt to [place]. Fyi to Dr. Darnell Level.

## 2018-02-03 ENCOUNTER — Telehealth: Payer: Self-pay | Admitting: Family Medicine

## 2018-02-03 MED ORDER — SERTRALINE HCL 25 MG PO TABS: 25.0000 mg | ORAL_TABLET | Freq: Every day | ORAL | 1 refills | Status: AC

## 2018-02-03 NOTE — Telephone Encounter (Signed)
Pt daughter in law, Wanona Stare (on [place]) said pt is leaving an independent nursing facility for a skilled nursing facility and the facility recommended she be prescribed an antidepressant. Pt was last seen 7/19 for follow up. She asked for a call back to discuss .[phone]

## 2018-02-03 NOTE — Telephone Encounter (Signed)
Nicole English returned to let you know [facility] is sending ppw regarding her move and wants to know if it has been received. I have looked up front and I cannot confirm.

## 2018-02-03 NOTE — Telephone Encounter (Addendum)
Discussed with pam. Ok to restart sertraline 25mg  daily.  plz fax to [facility]F

## 2018-02-03 NOTE — Addendum Note (Signed)
Addended by: Ria Bush on: 02/03/2018 07:10 PM   Modules accepted: Orders

## 2018-02-06 NOTE — Telephone Encounter (Signed)
Faxed form to [employer], ATTN:  Claudina Lick, at [phone]. Phn #:  [phone].

## 2018-02-07 ENCOUNTER — Telehealth: Payer: Self-pay | Admitting: Family Medicine

## 2018-02-07 NOTE — Telephone Encounter (Signed)
Documented pt's meds and faxed form.

## 2018-02-07 NOTE — Telephone Encounter (Signed)
Nicole English is calling and stated the SL2 that was sent she stated all the medication need to be on the form and not attached. Please advise. She stated it can't be accepted that way.

## 2018-02-20 DIAGNOSIS — Z8673 Personal history of transient ischemic attack (TIA), and cerebral infarction without residual deficits: Secondary | ICD-10-CM | POA: Diagnosis not present

## 2018-02-20 DIAGNOSIS — I10 Essential (primary) hypertension: Secondary | ICD-10-CM | POA: Diagnosis not present

## 2018-02-20 DIAGNOSIS — I5032 Chronic diastolic (congestive) heart failure: Secondary | ICD-10-CM | POA: Diagnosis not present

## 2018-02-20 DIAGNOSIS — Z79899 Other long term (current) drug therapy: Secondary | ICD-10-CM | POA: Diagnosis not present

## 2018-02-20 DIAGNOSIS — G300 Alzheimer's disease with early onset: Secondary | ICD-10-CM | POA: Diagnosis not present

## 2018-02-23 DIAGNOSIS — Z79899 Other long term (current) drug therapy: Secondary | ICD-10-CM | POA: Diagnosis not present

## 2018-03-07 ENCOUNTER — Ambulatory Visit: Payer: Medicare Other | Admitting: Neurology

## 2018-03-13 DIAGNOSIS — Z79899 Other long term (current) drug therapy: Secondary | ICD-10-CM | POA: Diagnosis not present

## 2018-03-13 DIAGNOSIS — I10 Essential (primary) hypertension: Secondary | ICD-10-CM | POA: Diagnosis not present

## 2018-03-13 DIAGNOSIS — I5032 Chronic diastolic (congestive) heart failure: Secondary | ICD-10-CM | POA: Diagnosis not present

## 2018-03-13 DIAGNOSIS — G309 Alzheimer's disease, unspecified: Secondary | ICD-10-CM | POA: Diagnosis not present

## 2018-03-20 DIAGNOSIS — Z79899 Other long term (current) drug therapy: Secondary | ICD-10-CM | POA: Diagnosis not present

## 2018-03-20 DIAGNOSIS — I10 Essential (primary) hypertension: Secondary | ICD-10-CM | POA: Diagnosis not present

## 2018-03-20 DIAGNOSIS — M7989 Other specified soft tissue disorders: Secondary | ICD-10-CM | POA: Diagnosis not present

## 2018-04-17 DIAGNOSIS — Z79899 Other long term (current) drug therapy: Secondary | ICD-10-CM | POA: Diagnosis not present

## 2018-04-17 DIAGNOSIS — G300 Alzheimer's disease with early onset: Secondary | ICD-10-CM | POA: Diagnosis not present

## 2018-04-17 DIAGNOSIS — I10 Essential (primary) hypertension: Secondary | ICD-10-CM | POA: Diagnosis not present

## 2018-04-17 DIAGNOSIS — K625 Hemorrhage of anus and rectum: Secondary | ICD-10-CM | POA: Diagnosis not present

## 2018-05-03 ENCOUNTER — Telehealth: Payer: Self-pay | Admitting: Cardiology

## 2018-05-03 NOTE — Telephone Encounter (Signed)
LVM to schedule 1 yr follow up 

## 2018-05-15 DIAGNOSIS — G8929 Other chronic pain: Secondary | ICD-10-CM | POA: Diagnosis not present

## 2018-05-15 DIAGNOSIS — K5901 Slow transit constipation: Secondary | ICD-10-CM | POA: Diagnosis not present

## 2018-05-15 DIAGNOSIS — I10 Essential (primary) hypertension: Secondary | ICD-10-CM | POA: Diagnosis not present

## 2018-05-15 DIAGNOSIS — Z79899 Other long term (current) drug therapy: Secondary | ICD-10-CM | POA: Diagnosis not present

## 2018-05-15 DIAGNOSIS — M545 Low back pain: Secondary | ICD-10-CM | POA: Diagnosis not present

## 2018-05-22 DIAGNOSIS — I5030 Unspecified diastolic (congestive) heart failure: Secondary | ICD-10-CM | POA: Diagnosis not present

## 2018-05-22 DIAGNOSIS — M545 Low back pain: Secondary | ICD-10-CM | POA: Diagnosis not present

## 2018-05-22 DIAGNOSIS — G8929 Other chronic pain: Secondary | ICD-10-CM | POA: Diagnosis not present

## 2018-05-22 DIAGNOSIS — I10 Essential (primary) hypertension: Secondary | ICD-10-CM | POA: Diagnosis not present

## 2018-05-22 DIAGNOSIS — Z79899 Other long term (current) drug therapy: Secondary | ICD-10-CM | POA: Diagnosis not present

## 2018-06-26 DIAGNOSIS — I10 Essential (primary) hypertension: Secondary | ICD-10-CM | POA: Diagnosis not present

## 2018-06-26 DIAGNOSIS — I5030 Unspecified diastolic (congestive) heart failure: Secondary | ICD-10-CM | POA: Diagnosis not present

## 2018-06-26 DIAGNOSIS — R5383 Other fatigue: Secondary | ICD-10-CM | POA: Diagnosis not present

## 2018-06-26 DIAGNOSIS — Z79899 Other long term (current) drug therapy: Secondary | ICD-10-CM | POA: Diagnosis not present

## 2018-06-26 DIAGNOSIS — N184 Chronic kidney disease, stage 4 (severe): Secondary | ICD-10-CM | POA: Diagnosis not present

## 2018-06-27 DIAGNOSIS — N390 Urinary tract infection, site not specified: Secondary | ICD-10-CM | POA: Diagnosis not present

## 2018-06-27 DIAGNOSIS — Z79899 Other long term (current) drug therapy: Secondary | ICD-10-CM | POA: Diagnosis not present

## 2018-06-28 DIAGNOSIS — Z79899 Other long term (current) drug therapy: Secondary | ICD-10-CM | POA: Diagnosis not present

## 2018-06-28 DIAGNOSIS — I5032 Chronic diastolic (congestive) heart failure: Secondary | ICD-10-CM | POA: Diagnosis not present

## 2018-06-28 DIAGNOSIS — N184 Chronic kidney disease, stage 4 (severe): Secondary | ICD-10-CM | POA: Diagnosis not present

## 2018-06-28 DIAGNOSIS — I10 Essential (primary) hypertension: Secondary | ICD-10-CM | POA: Diagnosis not present

## 2018-07-03 DIAGNOSIS — I10 Essential (primary) hypertension: Secondary | ICD-10-CM | POA: Diagnosis not present

## 2018-07-03 DIAGNOSIS — N185 Chronic kidney disease, stage 5: Secondary | ICD-10-CM | POA: Diagnosis not present

## 2018-07-03 DIAGNOSIS — N390 Urinary tract infection, site not specified: Secondary | ICD-10-CM | POA: Diagnosis not present

## 2018-07-03 DIAGNOSIS — Z79899 Other long term (current) drug therapy: Secondary | ICD-10-CM | POA: Diagnosis not present

## 2018-07-03 DIAGNOSIS — F0390 Unspecified dementia without behavioral disturbance: Secondary | ICD-10-CM | POA: Diagnosis not present

## 2018-07-05 DIAGNOSIS — N184 Chronic kidney disease, stage 4 (severe): Secondary | ICD-10-CM | POA: Diagnosis not present

## 2018-07-06 DIAGNOSIS — I132 Hypertensive heart and chronic kidney disease with heart failure and with stage 5 chronic kidney disease, or end stage renal disease: Secondary | ICD-10-CM | POA: Diagnosis not present

## 2018-07-06 DIAGNOSIS — R159 Full incontinence of feces: Secondary | ICD-10-CM | POA: Diagnosis not present

## 2018-07-06 DIAGNOSIS — J449 Chronic obstructive pulmonary disease, unspecified: Secondary | ICD-10-CM | POA: Diagnosis not present

## 2018-07-06 DIAGNOSIS — R32 Unspecified urinary incontinence: Secondary | ICD-10-CM | POA: Diagnosis not present

## 2018-07-06 DIAGNOSIS — I69318 Other symptoms and signs involving cognitive functions following cerebral infarction: Secondary | ICD-10-CM | POA: Diagnosis not present

## 2018-07-06 DIAGNOSIS — M810 Age-related osteoporosis without current pathological fracture: Secondary | ICD-10-CM | POA: Diagnosis not present

## 2018-07-06 DIAGNOSIS — F0390 Unspecified dementia without behavioral disturbance: Secondary | ICD-10-CM | POA: Diagnosis not present

## 2018-07-06 DIAGNOSIS — I509 Heart failure, unspecified: Secondary | ICD-10-CM | POA: Diagnosis not present

## 2018-07-06 DIAGNOSIS — Z87440 Personal history of urinary (tract) infections: Secondary | ICD-10-CM | POA: Diagnosis not present

## 2018-07-06 DIAGNOSIS — Z681 Body mass index (BMI) 19 or less, adult: Secondary | ICD-10-CM | POA: Diagnosis not present

## 2018-07-06 DIAGNOSIS — N186 End stage renal disease: Secondary | ICD-10-CM | POA: Diagnosis not present

## 2018-07-06 DIAGNOSIS — F329 Major depressive disorder, single episode, unspecified: Secondary | ICD-10-CM | POA: Diagnosis not present

## 2018-07-07 DIAGNOSIS — I69318 Other symptoms and signs involving cognitive functions following cerebral infarction: Secondary | ICD-10-CM | POA: Diagnosis not present

## 2018-07-07 DIAGNOSIS — Z87440 Personal history of urinary (tract) infections: Secondary | ICD-10-CM | POA: Diagnosis not present

## 2018-07-07 DIAGNOSIS — I132 Hypertensive heart and chronic kidney disease with heart failure and with stage 5 chronic kidney disease, or end stage renal disease: Secondary | ICD-10-CM | POA: Diagnosis not present

## 2018-07-07 DIAGNOSIS — F0390 Unspecified dementia without behavioral disturbance: Secondary | ICD-10-CM | POA: Diagnosis not present

## 2018-07-07 DIAGNOSIS — I509 Heart failure, unspecified: Secondary | ICD-10-CM | POA: Diagnosis not present

## 2018-07-07 DIAGNOSIS — N186 End stage renal disease: Secondary | ICD-10-CM | POA: Diagnosis not present

## 2018-07-12 DIAGNOSIS — N186 End stage renal disease: Secondary | ICD-10-CM | POA: Diagnosis not present

## 2018-07-12 DIAGNOSIS — I132 Hypertensive heart and chronic kidney disease with heart failure and with stage 5 chronic kidney disease, or end stage renal disease: Secondary | ICD-10-CM | POA: Diagnosis not present

## 2018-07-12 DIAGNOSIS — I69318 Other symptoms and signs involving cognitive functions following cerebral infarction: Secondary | ICD-10-CM | POA: Diagnosis not present

## 2018-07-12 DIAGNOSIS — F0390 Unspecified dementia without behavioral disturbance: Secondary | ICD-10-CM | POA: Diagnosis not present

## 2018-07-12 DIAGNOSIS — Z87440 Personal history of urinary (tract) infections: Secondary | ICD-10-CM | POA: Diagnosis not present

## 2018-07-12 DIAGNOSIS — I509 Heart failure, unspecified: Secondary | ICD-10-CM | POA: Diagnosis not present

## 2018-07-17 DIAGNOSIS — F0390 Unspecified dementia without behavioral disturbance: Secondary | ICD-10-CM | POA: Diagnosis not present

## 2018-07-17 DIAGNOSIS — I69318 Other symptoms and signs involving cognitive functions following cerebral infarction: Secondary | ICD-10-CM | POA: Diagnosis not present

## 2018-07-17 DIAGNOSIS — N186 End stage renal disease: Secondary | ICD-10-CM | POA: Diagnosis not present

## 2018-07-17 DIAGNOSIS — I132 Hypertensive heart and chronic kidney disease with heart failure and with stage 5 chronic kidney disease, or end stage renal disease: Secondary | ICD-10-CM | POA: Diagnosis not present

## 2018-07-17 DIAGNOSIS — Z87440 Personal history of urinary (tract) infections: Secondary | ICD-10-CM | POA: Diagnosis not present

## 2018-07-17 DIAGNOSIS — I509 Heart failure, unspecified: Secondary | ICD-10-CM | POA: Diagnosis not present

## 2018-07-19 DIAGNOSIS — J449 Chronic obstructive pulmonary disease, unspecified: Secondary | ICD-10-CM | POA: Diagnosis not present

## 2018-07-19 DIAGNOSIS — R32 Unspecified urinary incontinence: Secondary | ICD-10-CM | POA: Diagnosis not present

## 2018-07-19 DIAGNOSIS — F0390 Unspecified dementia without behavioral disturbance: Secondary | ICD-10-CM | POA: Diagnosis not present

## 2018-07-19 DIAGNOSIS — N186 End stage renal disease: Secondary | ICD-10-CM | POA: Diagnosis not present

## 2018-07-19 DIAGNOSIS — R159 Full incontinence of feces: Secondary | ICD-10-CM | POA: Diagnosis not present

## 2018-07-19 DIAGNOSIS — F329 Major depressive disorder, single episode, unspecified: Secondary | ICD-10-CM | POA: Diagnosis not present

## 2018-07-19 DIAGNOSIS — M810 Age-related osteoporosis without current pathological fracture: Secondary | ICD-10-CM | POA: Diagnosis not present

## 2018-07-19 DIAGNOSIS — I132 Hypertensive heart and chronic kidney disease with heart failure and with stage 5 chronic kidney disease, or end stage renal disease: Secondary | ICD-10-CM | POA: Diagnosis not present

## 2018-07-19 DIAGNOSIS — I509 Heart failure, unspecified: Secondary | ICD-10-CM | POA: Diagnosis not present

## 2018-07-19 DIAGNOSIS — I69318 Other symptoms and signs involving cognitive functions following cerebral infarction: Secondary | ICD-10-CM | POA: Diagnosis not present

## 2018-07-19 DIAGNOSIS — Z681 Body mass index (BMI) 19 or less, adult: Secondary | ICD-10-CM | POA: Diagnosis not present

## 2018-07-19 DIAGNOSIS — Z87440 Personal history of urinary (tract) infections: Secondary | ICD-10-CM | POA: Diagnosis not present

## 2018-07-21 DIAGNOSIS — I69318 Other symptoms and signs involving cognitive functions following cerebral infarction: Secondary | ICD-10-CM | POA: Diagnosis not present

## 2018-07-21 DIAGNOSIS — I509 Heart failure, unspecified: Secondary | ICD-10-CM | POA: Diagnosis not present

## 2018-07-21 DIAGNOSIS — N186 End stage renal disease: Secondary | ICD-10-CM | POA: Diagnosis not present

## 2018-07-21 DIAGNOSIS — F0390 Unspecified dementia without behavioral disturbance: Secondary | ICD-10-CM | POA: Diagnosis not present

## 2018-07-21 DIAGNOSIS — Z87440 Personal history of urinary (tract) infections: Secondary | ICD-10-CM | POA: Diagnosis not present

## 2018-07-21 DIAGNOSIS — I132 Hypertensive heart and chronic kidney disease with heart failure and with stage 5 chronic kidney disease, or end stage renal disease: Secondary | ICD-10-CM | POA: Diagnosis not present

## 2018-07-24 DIAGNOSIS — N186 End stage renal disease: Secondary | ICD-10-CM | POA: Diagnosis not present

## 2018-07-24 DIAGNOSIS — F0390 Unspecified dementia without behavioral disturbance: Secondary | ICD-10-CM | POA: Diagnosis not present

## 2018-07-24 DIAGNOSIS — I132 Hypertensive heart and chronic kidney disease with heart failure and with stage 5 chronic kidney disease, or end stage renal disease: Secondary | ICD-10-CM | POA: Diagnosis not present

## 2018-07-24 DIAGNOSIS — I509 Heart failure, unspecified: Secondary | ICD-10-CM | POA: Diagnosis not present

## 2018-07-24 DIAGNOSIS — Z87440 Personal history of urinary (tract) infections: Secondary | ICD-10-CM | POA: Diagnosis not present

## 2018-07-24 DIAGNOSIS — I69318 Other symptoms and signs involving cognitive functions following cerebral infarction: Secondary | ICD-10-CM | POA: Diagnosis not present

## 2018-07-31 DIAGNOSIS — I132 Hypertensive heart and chronic kidney disease with heart failure and with stage 5 chronic kidney disease, or end stage renal disease: Secondary | ICD-10-CM | POA: Diagnosis not present

## 2018-07-31 DIAGNOSIS — I509 Heart failure, unspecified: Secondary | ICD-10-CM | POA: Diagnosis not present

## 2018-07-31 DIAGNOSIS — N186 End stage renal disease: Secondary | ICD-10-CM | POA: Diagnosis not present

## 2018-07-31 DIAGNOSIS — Z87440 Personal history of urinary (tract) infections: Secondary | ICD-10-CM | POA: Diagnosis not present

## 2018-07-31 DIAGNOSIS — F0390 Unspecified dementia without behavioral disturbance: Secondary | ICD-10-CM | POA: Diagnosis not present

## 2018-07-31 DIAGNOSIS — I69318 Other symptoms and signs involving cognitive functions following cerebral infarction: Secondary | ICD-10-CM | POA: Diagnosis not present

## 2018-08-09 DIAGNOSIS — Z1159 Encounter for screening for other viral diseases: Secondary | ICD-10-CM | POA: Diagnosis not present

## 2018-08-14 DIAGNOSIS — R531 Weakness: Secondary | ICD-10-CM | POA: Diagnosis not present

## 2018-08-14 DIAGNOSIS — R5383 Other fatigue: Secondary | ICD-10-CM | POA: Diagnosis not present

## 2018-08-16 DIAGNOSIS — Z1159 Encounter for screening for other viral diseases: Secondary | ICD-10-CM | POA: Diagnosis not present

## 2018-08-19 DIAGNOSIS — I69318 Other symptoms and signs involving cognitive functions following cerebral infarction: Secondary | ICD-10-CM | POA: Diagnosis not present

## 2018-08-19 DIAGNOSIS — J449 Chronic obstructive pulmonary disease, unspecified: Secondary | ICD-10-CM | POA: Diagnosis not present

## 2018-08-19 DIAGNOSIS — I509 Heart failure, unspecified: Secondary | ICD-10-CM | POA: Diagnosis not present

## 2018-08-19 DIAGNOSIS — N186 End stage renal disease: Secondary | ICD-10-CM | POA: Diagnosis not present

## 2018-08-19 DIAGNOSIS — R32 Unspecified urinary incontinence: Secondary | ICD-10-CM | POA: Diagnosis not present

## 2018-08-19 DIAGNOSIS — R159 Full incontinence of feces: Secondary | ICD-10-CM | POA: Diagnosis not present

## 2018-08-19 DIAGNOSIS — Z681 Body mass index (BMI) 19 or less, adult: Secondary | ICD-10-CM | POA: Diagnosis not present

## 2018-08-19 DIAGNOSIS — F0390 Unspecified dementia without behavioral disturbance: Secondary | ICD-10-CM | POA: Diagnosis not present

## 2018-08-19 DIAGNOSIS — F329 Major depressive disorder, single episode, unspecified: Secondary | ICD-10-CM | POA: Diagnosis not present

## 2018-08-19 DIAGNOSIS — M810 Age-related osteoporosis without current pathological fracture: Secondary | ICD-10-CM | POA: Diagnosis not present

## 2018-08-19 DIAGNOSIS — Z87440 Personal history of urinary (tract) infections: Secondary | ICD-10-CM | POA: Diagnosis not present

## 2018-08-19 DIAGNOSIS — I132 Hypertensive heart and chronic kidney disease with heart failure and with stage 5 chronic kidney disease, or end stage renal disease: Secondary | ICD-10-CM | POA: Diagnosis not present

## 2018-08-21 DIAGNOSIS — R51 Headache: Secondary | ICD-10-CM | POA: Diagnosis not present

## 2018-08-21 DIAGNOSIS — Z79899 Other long term (current) drug therapy: Secondary | ICD-10-CM | POA: Diagnosis not present

## 2018-08-21 DIAGNOSIS — I10 Essential (primary) hypertension: Secondary | ICD-10-CM | POA: Diagnosis not present

## 2018-08-22 DIAGNOSIS — Z87440 Personal history of urinary (tract) infections: Secondary | ICD-10-CM | POA: Diagnosis not present

## 2018-08-22 DIAGNOSIS — I132 Hypertensive heart and chronic kidney disease with heart failure and with stage 5 chronic kidney disease, or end stage renal disease: Secondary | ICD-10-CM | POA: Diagnosis not present

## 2018-08-22 DIAGNOSIS — N186 End stage renal disease: Secondary | ICD-10-CM | POA: Diagnosis not present

## 2018-08-22 DIAGNOSIS — I69318 Other symptoms and signs involving cognitive functions following cerebral infarction: Secondary | ICD-10-CM | POA: Diagnosis not present

## 2018-08-22 DIAGNOSIS — I509 Heart failure, unspecified: Secondary | ICD-10-CM | POA: Diagnosis not present

## 2018-08-22 DIAGNOSIS — F0390 Unspecified dementia without behavioral disturbance: Secondary | ICD-10-CM | POA: Diagnosis not present

## 2018-08-25 DIAGNOSIS — Z1159 Encounter for screening for other viral diseases: Secondary | ICD-10-CM | POA: Diagnosis not present

## 2018-08-31 DIAGNOSIS — Z1159 Encounter for screening for other viral diseases: Secondary | ICD-10-CM | POA: Diagnosis not present

## 2018-09-11 DIAGNOSIS — I132 Hypertensive heart and chronic kidney disease with heart failure and with stage 5 chronic kidney disease, or end stage renal disease: Secondary | ICD-10-CM | POA: Diagnosis not present

## 2018-09-11 DIAGNOSIS — I69318 Other symptoms and signs involving cognitive functions following cerebral infarction: Secondary | ICD-10-CM | POA: Diagnosis not present

## 2018-09-11 DIAGNOSIS — I509 Heart failure, unspecified: Secondary | ICD-10-CM | POA: Diagnosis not present

## 2018-09-11 DIAGNOSIS — Z87440 Personal history of urinary (tract) infections: Secondary | ICD-10-CM | POA: Diagnosis not present

## 2018-09-11 DIAGNOSIS — N186 End stage renal disease: Secondary | ICD-10-CM | POA: Diagnosis not present

## 2018-09-11 DIAGNOSIS — F0390 Unspecified dementia without behavioral disturbance: Secondary | ICD-10-CM | POA: Diagnosis not present

## 2018-09-19 DIAGNOSIS — F329 Major depressive disorder, single episode, unspecified: Secondary | ICD-10-CM | POA: Diagnosis not present

## 2018-09-19 DIAGNOSIS — Z87440 Personal history of urinary (tract) infections: Secondary | ICD-10-CM | POA: Diagnosis not present

## 2018-09-19 DIAGNOSIS — R159 Full incontinence of feces: Secondary | ICD-10-CM | POA: Diagnosis not present

## 2018-09-19 DIAGNOSIS — J449 Chronic obstructive pulmonary disease, unspecified: Secondary | ICD-10-CM | POA: Diagnosis not present

## 2018-09-19 DIAGNOSIS — I69318 Other symptoms and signs involving cognitive functions following cerebral infarction: Secondary | ICD-10-CM | POA: Diagnosis not present

## 2018-09-19 DIAGNOSIS — M810 Age-related osteoporosis without current pathological fracture: Secondary | ICD-10-CM | POA: Diagnosis not present

## 2018-09-19 DIAGNOSIS — I132 Hypertensive heart and chronic kidney disease with heart failure and with stage 5 chronic kidney disease, or end stage renal disease: Secondary | ICD-10-CM | POA: Diagnosis not present

## 2018-09-19 DIAGNOSIS — N186 End stage renal disease: Secondary | ICD-10-CM | POA: Diagnosis not present

## 2018-09-19 DIAGNOSIS — Z681 Body mass index (BMI) 19 or less, adult: Secondary | ICD-10-CM | POA: Diagnosis not present

## 2018-09-19 DIAGNOSIS — R32 Unspecified urinary incontinence: Secondary | ICD-10-CM | POA: Diagnosis not present

## 2018-09-19 DIAGNOSIS — F0390 Unspecified dementia without behavioral disturbance: Secondary | ICD-10-CM | POA: Diagnosis not present

## 2018-09-19 DIAGNOSIS — I509 Heart failure, unspecified: Secondary | ICD-10-CM | POA: Diagnosis not present

## 2018-09-20 DIAGNOSIS — N186 End stage renal disease: Secondary | ICD-10-CM | POA: Diagnosis not present

## 2018-09-20 DIAGNOSIS — F0390 Unspecified dementia without behavioral disturbance: Secondary | ICD-10-CM | POA: Diagnosis not present

## 2018-09-20 DIAGNOSIS — I69318 Other symptoms and signs involving cognitive functions following cerebral infarction: Secondary | ICD-10-CM | POA: Diagnosis not present

## 2018-09-20 DIAGNOSIS — I132 Hypertensive heart and chronic kidney disease with heart failure and with stage 5 chronic kidney disease, or end stage renal disease: Secondary | ICD-10-CM | POA: Diagnosis not present

## 2018-09-20 DIAGNOSIS — Z87440 Personal history of urinary (tract) infections: Secondary | ICD-10-CM | POA: Diagnosis not present

## 2018-09-20 DIAGNOSIS — I509 Heart failure, unspecified: Secondary | ICD-10-CM | POA: Diagnosis not present

## 2018-09-22 DIAGNOSIS — I509 Heart failure, unspecified: Secondary | ICD-10-CM | POA: Diagnosis not present

## 2018-09-22 DIAGNOSIS — F0390 Unspecified dementia without behavioral disturbance: Secondary | ICD-10-CM | POA: Diagnosis not present

## 2018-09-22 DIAGNOSIS — N186 End stage renal disease: Secondary | ICD-10-CM | POA: Diagnosis not present

## 2018-09-22 DIAGNOSIS — I69318 Other symptoms and signs involving cognitive functions following cerebral infarction: Secondary | ICD-10-CM | POA: Diagnosis not present

## 2018-09-22 DIAGNOSIS — I132 Hypertensive heart and chronic kidney disease with heart failure and with stage 5 chronic kidney disease, or end stage renal disease: Secondary | ICD-10-CM | POA: Diagnosis not present

## 2018-09-22 DIAGNOSIS — Z87440 Personal history of urinary (tract) infections: Secondary | ICD-10-CM | POA: Diagnosis not present

## 2018-09-27 DIAGNOSIS — I509 Heart failure, unspecified: Secondary | ICD-10-CM | POA: Diagnosis not present

## 2018-09-27 DIAGNOSIS — F0390 Unspecified dementia without behavioral disturbance: Secondary | ICD-10-CM | POA: Diagnosis not present

## 2018-09-27 DIAGNOSIS — I69318 Other symptoms and signs involving cognitive functions following cerebral infarction: Secondary | ICD-10-CM | POA: Diagnosis not present

## 2018-09-27 DIAGNOSIS — N186 End stage renal disease: Secondary | ICD-10-CM | POA: Diagnosis not present

## 2018-09-27 DIAGNOSIS — Z87440 Personal history of urinary (tract) infections: Secondary | ICD-10-CM | POA: Diagnosis not present

## 2018-09-27 DIAGNOSIS — I132 Hypertensive heart and chronic kidney disease with heart failure and with stage 5 chronic kidney disease, or end stage renal disease: Secondary | ICD-10-CM | POA: Diagnosis not present

## 2018-10-04 DIAGNOSIS — F0390 Unspecified dementia without behavioral disturbance: Secondary | ICD-10-CM | POA: Diagnosis not present

## 2018-10-04 DIAGNOSIS — I509 Heart failure, unspecified: Secondary | ICD-10-CM | POA: Diagnosis not present

## 2018-10-04 DIAGNOSIS — Z87440 Personal history of urinary (tract) infections: Secondary | ICD-10-CM | POA: Diagnosis not present

## 2018-10-04 DIAGNOSIS — N186 End stage renal disease: Secondary | ICD-10-CM | POA: Diagnosis not present

## 2018-10-04 DIAGNOSIS — I69318 Other symptoms and signs involving cognitive functions following cerebral infarction: Secondary | ICD-10-CM | POA: Diagnosis not present

## 2018-10-04 DIAGNOSIS — I132 Hypertensive heart and chronic kidney disease with heart failure and with stage 5 chronic kidney disease, or end stage renal disease: Secondary | ICD-10-CM | POA: Diagnosis not present

## 2018-10-09 DIAGNOSIS — N186 End stage renal disease: Secondary | ICD-10-CM | POA: Diagnosis not present

## 2018-10-09 DIAGNOSIS — Z87440 Personal history of urinary (tract) infections: Secondary | ICD-10-CM | POA: Diagnosis not present

## 2018-10-09 DIAGNOSIS — I132 Hypertensive heart and chronic kidney disease with heart failure and with stage 5 chronic kidney disease, or end stage renal disease: Secondary | ICD-10-CM | POA: Diagnosis not present

## 2018-10-09 DIAGNOSIS — F0390 Unspecified dementia without behavioral disturbance: Secondary | ICD-10-CM | POA: Diagnosis not present

## 2018-10-09 DIAGNOSIS — I509 Heart failure, unspecified: Secondary | ICD-10-CM | POA: Diagnosis not present

## 2018-10-09 DIAGNOSIS — I69318 Other symptoms and signs involving cognitive functions following cerebral infarction: Secondary | ICD-10-CM | POA: Diagnosis not present

## 2018-10-17 DIAGNOSIS — I509 Heart failure, unspecified: Secondary | ICD-10-CM | POA: Diagnosis not present

## 2018-10-17 DIAGNOSIS — F0390 Unspecified dementia without behavioral disturbance: Secondary | ICD-10-CM | POA: Diagnosis not present

## 2018-10-17 DIAGNOSIS — Z87440 Personal history of urinary (tract) infections: Secondary | ICD-10-CM | POA: Diagnosis not present

## 2018-10-17 DIAGNOSIS — N186 End stage renal disease: Secondary | ICD-10-CM | POA: Diagnosis not present

## 2018-10-17 DIAGNOSIS — I69318 Other symptoms and signs involving cognitive functions following cerebral infarction: Secondary | ICD-10-CM | POA: Diagnosis not present

## 2018-10-17 DIAGNOSIS — I132 Hypertensive heart and chronic kidney disease with heart failure and with stage 5 chronic kidney disease, or end stage renal disease: Secondary | ICD-10-CM | POA: Diagnosis not present

## 2018-10-19 DIAGNOSIS — I69318 Other symptoms and signs involving cognitive functions following cerebral infarction: Secondary | ICD-10-CM | POA: Diagnosis not present

## 2018-10-19 DIAGNOSIS — R159 Full incontinence of feces: Secondary | ICD-10-CM | POA: Diagnosis not present

## 2018-10-19 DIAGNOSIS — Z87440 Personal history of urinary (tract) infections: Secondary | ICD-10-CM | POA: Diagnosis not present

## 2018-10-19 DIAGNOSIS — Z681 Body mass index (BMI) 19 or less, adult: Secondary | ICD-10-CM | POA: Diagnosis not present

## 2018-10-19 DIAGNOSIS — F0390 Unspecified dementia without behavioral disturbance: Secondary | ICD-10-CM | POA: Diagnosis not present

## 2018-10-19 DIAGNOSIS — I132 Hypertensive heart and chronic kidney disease with heart failure and with stage 5 chronic kidney disease, or end stage renal disease: Secondary | ICD-10-CM | POA: Diagnosis not present

## 2018-10-19 DIAGNOSIS — J449 Chronic obstructive pulmonary disease, unspecified: Secondary | ICD-10-CM | POA: Diagnosis not present

## 2018-10-19 DIAGNOSIS — N186 End stage renal disease: Secondary | ICD-10-CM | POA: Diagnosis not present

## 2018-10-19 DIAGNOSIS — R32 Unspecified urinary incontinence: Secondary | ICD-10-CM | POA: Diagnosis not present

## 2018-10-19 DIAGNOSIS — M810 Age-related osteoporosis without current pathological fracture: Secondary | ICD-10-CM | POA: Diagnosis not present

## 2018-10-19 DIAGNOSIS — I509 Heart failure, unspecified: Secondary | ICD-10-CM | POA: Diagnosis not present

## 2018-10-19 DIAGNOSIS — F329 Major depressive disorder, single episode, unspecified: Secondary | ICD-10-CM | POA: Diagnosis not present

## 2018-10-19 DIAGNOSIS — R197 Diarrhea, unspecified: Secondary | ICD-10-CM | POA: Diagnosis not present

## 2018-10-27 DIAGNOSIS — I509 Heart failure, unspecified: Secondary | ICD-10-CM | POA: Diagnosis not present

## 2018-10-27 DIAGNOSIS — F0390 Unspecified dementia without behavioral disturbance: Secondary | ICD-10-CM | POA: Diagnosis not present

## 2018-10-27 DIAGNOSIS — J449 Chronic obstructive pulmonary disease, unspecified: Secondary | ICD-10-CM | POA: Diagnosis not present

## 2018-10-27 DIAGNOSIS — I69318 Other symptoms and signs involving cognitive functions following cerebral infarction: Secondary | ICD-10-CM | POA: Diagnosis not present

## 2018-10-27 DIAGNOSIS — I132 Hypertensive heart and chronic kidney disease with heart failure and with stage 5 chronic kidney disease, or end stage renal disease: Secondary | ICD-10-CM | POA: Diagnosis not present

## 2018-10-27 DIAGNOSIS — N186 End stage renal disease: Secondary | ICD-10-CM | POA: Diagnosis not present

## 2018-10-30 DIAGNOSIS — N185 Chronic kidney disease, stage 5: Secondary | ICD-10-CM | POA: Diagnosis not present

## 2018-10-30 DIAGNOSIS — Z79899 Other long term (current) drug therapy: Secondary | ICD-10-CM | POA: Diagnosis not present

## 2018-10-30 DIAGNOSIS — F0150 Vascular dementia without behavioral disturbance: Secondary | ICD-10-CM | POA: Diagnosis not present

## 2018-10-30 DIAGNOSIS — I10 Essential (primary) hypertension: Secondary | ICD-10-CM | POA: Diagnosis not present

## 2018-10-30 DIAGNOSIS — I5030 Unspecified diastolic (congestive) heart failure: Secondary | ICD-10-CM | POA: Diagnosis not present

## 2018-10-31 DIAGNOSIS — J449 Chronic obstructive pulmonary disease, unspecified: Secondary | ICD-10-CM | POA: Diagnosis not present

## 2018-10-31 DIAGNOSIS — F0390 Unspecified dementia without behavioral disturbance: Secondary | ICD-10-CM | POA: Diagnosis not present

## 2018-10-31 DIAGNOSIS — I69318 Other symptoms and signs involving cognitive functions following cerebral infarction: Secondary | ICD-10-CM | POA: Diagnosis not present

## 2018-10-31 DIAGNOSIS — N186 End stage renal disease: Secondary | ICD-10-CM | POA: Diagnosis not present

## 2018-10-31 DIAGNOSIS — I132 Hypertensive heart and chronic kidney disease with heart failure and with stage 5 chronic kidney disease, or end stage renal disease: Secondary | ICD-10-CM | POA: Diagnosis not present

## 2018-10-31 DIAGNOSIS — I509 Heart failure, unspecified: Secondary | ICD-10-CM | POA: Diagnosis not present

## 2018-11-03 DIAGNOSIS — I509 Heart failure, unspecified: Secondary | ICD-10-CM | POA: Diagnosis not present

## 2018-11-03 DIAGNOSIS — I132 Hypertensive heart and chronic kidney disease with heart failure and with stage 5 chronic kidney disease, or end stage renal disease: Secondary | ICD-10-CM | POA: Diagnosis not present

## 2018-11-03 DIAGNOSIS — N186 End stage renal disease: Secondary | ICD-10-CM | POA: Diagnosis not present

## 2018-11-03 DIAGNOSIS — F0390 Unspecified dementia without behavioral disturbance: Secondary | ICD-10-CM | POA: Diagnosis not present

## 2018-11-03 DIAGNOSIS — I69318 Other symptoms and signs involving cognitive functions following cerebral infarction: Secondary | ICD-10-CM | POA: Diagnosis not present

## 2018-11-03 DIAGNOSIS — J449 Chronic obstructive pulmonary disease, unspecified: Secondary | ICD-10-CM | POA: Diagnosis not present

## 2018-11-06 DIAGNOSIS — I10 Essential (primary) hypertension: Secondary | ICD-10-CM | POA: Diagnosis not present

## 2018-11-06 DIAGNOSIS — R634 Abnormal weight loss: Secondary | ICD-10-CM | POA: Diagnosis not present

## 2018-11-06 DIAGNOSIS — F0150 Vascular dementia without behavioral disturbance: Secondary | ICD-10-CM | POA: Diagnosis not present

## 2018-11-06 DIAGNOSIS — F331 Major depressive disorder, recurrent, moderate: Secondary | ICD-10-CM | POA: Diagnosis not present

## 2018-11-06 DIAGNOSIS — Z79899 Other long term (current) drug therapy: Secondary | ICD-10-CM | POA: Diagnosis not present

## 2018-11-10 DIAGNOSIS — F0390 Unspecified dementia without behavioral disturbance: Secondary | ICD-10-CM | POA: Diagnosis not present

## 2018-11-10 DIAGNOSIS — J449 Chronic obstructive pulmonary disease, unspecified: Secondary | ICD-10-CM | POA: Diagnosis not present

## 2018-11-10 DIAGNOSIS — I132 Hypertensive heart and chronic kidney disease with heart failure and with stage 5 chronic kidney disease, or end stage renal disease: Secondary | ICD-10-CM | POA: Diagnosis not present

## 2018-11-10 DIAGNOSIS — I69318 Other symptoms and signs involving cognitive functions following cerebral infarction: Secondary | ICD-10-CM | POA: Diagnosis not present

## 2018-11-10 DIAGNOSIS — I509 Heart failure, unspecified: Secondary | ICD-10-CM | POA: Diagnosis not present

## 2018-11-10 DIAGNOSIS — N186 End stage renal disease: Secondary | ICD-10-CM | POA: Diagnosis not present

## 2018-11-16 DIAGNOSIS — I69318 Other symptoms and signs involving cognitive functions following cerebral infarction: Secondary | ICD-10-CM | POA: Diagnosis not present

## 2018-11-16 DIAGNOSIS — I132 Hypertensive heart and chronic kidney disease with heart failure and with stage 5 chronic kidney disease, or end stage renal disease: Secondary | ICD-10-CM | POA: Diagnosis not present

## 2018-11-16 DIAGNOSIS — F0390 Unspecified dementia without behavioral disturbance: Secondary | ICD-10-CM | POA: Diagnosis not present

## 2018-11-16 DIAGNOSIS — N186 End stage renal disease: Secondary | ICD-10-CM | POA: Diagnosis not present

## 2018-11-16 DIAGNOSIS — I509 Heart failure, unspecified: Secondary | ICD-10-CM | POA: Diagnosis not present

## 2018-11-16 DIAGNOSIS — J449 Chronic obstructive pulmonary disease, unspecified: Secondary | ICD-10-CM | POA: Diagnosis not present

## 2018-11-19 DIAGNOSIS — F329 Major depressive disorder, single episode, unspecified: Secondary | ICD-10-CM | POA: Diagnosis not present

## 2018-11-19 DIAGNOSIS — R159 Full incontinence of feces: Secondary | ICD-10-CM | POA: Diagnosis not present

## 2018-11-19 DIAGNOSIS — R197 Diarrhea, unspecified: Secondary | ICD-10-CM | POA: Diagnosis not present

## 2018-11-19 DIAGNOSIS — R32 Unspecified urinary incontinence: Secondary | ICD-10-CM | POA: Diagnosis not present

## 2018-11-19 DIAGNOSIS — Z681 Body mass index (BMI) 19 or less, adult: Secondary | ICD-10-CM | POA: Diagnosis not present

## 2018-11-19 DIAGNOSIS — M810 Age-related osteoporosis without current pathological fracture: Secondary | ICD-10-CM | POA: Diagnosis not present

## 2018-11-19 DIAGNOSIS — Z87440 Personal history of urinary (tract) infections: Secondary | ICD-10-CM | POA: Diagnosis not present

## 2018-11-19 DIAGNOSIS — J449 Chronic obstructive pulmonary disease, unspecified: Secondary | ICD-10-CM | POA: Diagnosis not present

## 2018-11-19 DIAGNOSIS — F0390 Unspecified dementia without behavioral disturbance: Secondary | ICD-10-CM | POA: Diagnosis not present

## 2018-11-19 DIAGNOSIS — I132 Hypertensive heart and chronic kidney disease with heart failure and with stage 5 chronic kidney disease, or end stage renal disease: Secondary | ICD-10-CM | POA: Diagnosis not present

## 2018-11-19 DIAGNOSIS — N186 End stage renal disease: Secondary | ICD-10-CM | POA: Diagnosis not present

## 2018-11-19 DIAGNOSIS — I69318 Other symptoms and signs involving cognitive functions following cerebral infarction: Secondary | ICD-10-CM | POA: Diagnosis not present

## 2018-11-19 DIAGNOSIS — I509 Heart failure, unspecified: Secondary | ICD-10-CM | POA: Diagnosis not present

## 2018-11-20 DIAGNOSIS — I69318 Other symptoms and signs involving cognitive functions following cerebral infarction: Secondary | ICD-10-CM | POA: Diagnosis not present

## 2018-11-20 DIAGNOSIS — N186 End stage renal disease: Secondary | ICD-10-CM | POA: Diagnosis not present

## 2018-11-20 DIAGNOSIS — I509 Heart failure, unspecified: Secondary | ICD-10-CM | POA: Diagnosis not present

## 2018-11-20 DIAGNOSIS — I132 Hypertensive heart and chronic kidney disease with heart failure and with stage 5 chronic kidney disease, or end stage renal disease: Secondary | ICD-10-CM | POA: Diagnosis not present

## 2018-11-20 DIAGNOSIS — J449 Chronic obstructive pulmonary disease, unspecified: Secondary | ICD-10-CM | POA: Diagnosis not present

## 2018-11-20 DIAGNOSIS — F0390 Unspecified dementia without behavioral disturbance: Secondary | ICD-10-CM | POA: Diagnosis not present

## 2018-11-22 DIAGNOSIS — N186 End stage renal disease: Secondary | ICD-10-CM | POA: Diagnosis not present

## 2018-11-22 DIAGNOSIS — F0390 Unspecified dementia without behavioral disturbance: Secondary | ICD-10-CM | POA: Diagnosis not present

## 2018-11-22 DIAGNOSIS — J449 Chronic obstructive pulmonary disease, unspecified: Secondary | ICD-10-CM | POA: Diagnosis not present

## 2018-11-22 DIAGNOSIS — I69318 Other symptoms and signs involving cognitive functions following cerebral infarction: Secondary | ICD-10-CM | POA: Diagnosis not present

## 2018-11-22 DIAGNOSIS — I132 Hypertensive heart and chronic kidney disease with heart failure and with stage 5 chronic kidney disease, or end stage renal disease: Secondary | ICD-10-CM | POA: Diagnosis not present

## 2018-11-22 DIAGNOSIS — I509 Heart failure, unspecified: Secondary | ICD-10-CM | POA: Diagnosis not present

## 2018-11-24 DIAGNOSIS — I509 Heart failure, unspecified: Secondary | ICD-10-CM | POA: Diagnosis not present

## 2018-11-24 DIAGNOSIS — N186 End stage renal disease: Secondary | ICD-10-CM | POA: Diagnosis not present

## 2018-11-24 DIAGNOSIS — I132 Hypertensive heart and chronic kidney disease with heart failure and with stage 5 chronic kidney disease, or end stage renal disease: Secondary | ICD-10-CM | POA: Diagnosis not present

## 2018-11-24 DIAGNOSIS — F0390 Unspecified dementia without behavioral disturbance: Secondary | ICD-10-CM | POA: Diagnosis not present

## 2018-11-24 DIAGNOSIS — J449 Chronic obstructive pulmonary disease, unspecified: Secondary | ICD-10-CM | POA: Diagnosis not present

## 2018-11-24 DIAGNOSIS — I69318 Other symptoms and signs involving cognitive functions following cerebral infarction: Secondary | ICD-10-CM | POA: Diagnosis not present

## 2018-12-01 DIAGNOSIS — I132 Hypertensive heart and chronic kidney disease with heart failure and with stage 5 chronic kidney disease, or end stage renal disease: Secondary | ICD-10-CM | POA: Diagnosis not present

## 2018-12-01 DIAGNOSIS — F0390 Unspecified dementia without behavioral disturbance: Secondary | ICD-10-CM | POA: Diagnosis not present

## 2018-12-01 DIAGNOSIS — J449 Chronic obstructive pulmonary disease, unspecified: Secondary | ICD-10-CM | POA: Diagnosis not present

## 2018-12-01 DIAGNOSIS — I69318 Other symptoms and signs involving cognitive functions following cerebral infarction: Secondary | ICD-10-CM | POA: Diagnosis not present

## 2018-12-01 DIAGNOSIS — N186 End stage renal disease: Secondary | ICD-10-CM | POA: Diagnosis not present

## 2018-12-01 DIAGNOSIS — I509 Heart failure, unspecified: Secondary | ICD-10-CM | POA: Diagnosis not present

## 2018-12-06 DIAGNOSIS — I509 Heart failure, unspecified: Secondary | ICD-10-CM | POA: Diagnosis not present

## 2018-12-06 DIAGNOSIS — J449 Chronic obstructive pulmonary disease, unspecified: Secondary | ICD-10-CM | POA: Diagnosis not present

## 2018-12-06 DIAGNOSIS — N186 End stage renal disease: Secondary | ICD-10-CM | POA: Diagnosis not present

## 2018-12-06 DIAGNOSIS — I69318 Other symptoms and signs involving cognitive functions following cerebral infarction: Secondary | ICD-10-CM | POA: Diagnosis not present

## 2018-12-06 DIAGNOSIS — I132 Hypertensive heart and chronic kidney disease with heart failure and with stage 5 chronic kidney disease, or end stage renal disease: Secondary | ICD-10-CM | POA: Diagnosis not present

## 2018-12-06 DIAGNOSIS — F0390 Unspecified dementia without behavioral disturbance: Secondary | ICD-10-CM | POA: Diagnosis not present

## 2018-12-11 DIAGNOSIS — I10 Essential (primary) hypertension: Secondary | ICD-10-CM | POA: Diagnosis not present

## 2018-12-11 DIAGNOSIS — I69318 Other symptoms and signs involving cognitive functions following cerebral infarction: Secondary | ICD-10-CM | POA: Diagnosis not present

## 2018-12-11 DIAGNOSIS — I132 Hypertensive heart and chronic kidney disease with heart failure and with stage 5 chronic kidney disease, or end stage renal disease: Secondary | ICD-10-CM | POA: Diagnosis not present

## 2018-12-11 DIAGNOSIS — Z79899 Other long term (current) drug therapy: Secondary | ICD-10-CM | POA: Diagnosis not present

## 2018-12-11 DIAGNOSIS — J449 Chronic obstructive pulmonary disease, unspecified: Secondary | ICD-10-CM | POA: Diagnosis not present

## 2018-12-11 DIAGNOSIS — N185 Chronic kidney disease, stage 5: Secondary | ICD-10-CM | POA: Diagnosis not present

## 2018-12-11 DIAGNOSIS — F0390 Unspecified dementia without behavioral disturbance: Secondary | ICD-10-CM | POA: Diagnosis not present

## 2018-12-11 DIAGNOSIS — R627 Adult failure to thrive: Secondary | ICD-10-CM | POA: Diagnosis not present

## 2018-12-11 DIAGNOSIS — F0150 Vascular dementia without behavioral disturbance: Secondary | ICD-10-CM | POA: Diagnosis not present

## 2018-12-11 DIAGNOSIS — I509 Heart failure, unspecified: Secondary | ICD-10-CM | POA: Diagnosis not present

## 2018-12-11 DIAGNOSIS — N186 End stage renal disease: Secondary | ICD-10-CM | POA: Diagnosis not present

## 2018-12-12 DIAGNOSIS — F0390 Unspecified dementia without behavioral disturbance: Secondary | ICD-10-CM | POA: Diagnosis not present

## 2018-12-12 DIAGNOSIS — I69318 Other symptoms and signs involving cognitive functions following cerebral infarction: Secondary | ICD-10-CM | POA: Diagnosis not present

## 2018-12-12 DIAGNOSIS — N186 End stage renal disease: Secondary | ICD-10-CM | POA: Diagnosis not present

## 2018-12-12 DIAGNOSIS — I509 Heart failure, unspecified: Secondary | ICD-10-CM | POA: Diagnosis not present

## 2018-12-12 DIAGNOSIS — I132 Hypertensive heart and chronic kidney disease with heart failure and with stage 5 chronic kidney disease, or end stage renal disease: Secondary | ICD-10-CM | POA: Diagnosis not present

## 2018-12-12 DIAGNOSIS — J449 Chronic obstructive pulmonary disease, unspecified: Secondary | ICD-10-CM | POA: Diagnosis not present

## 2018-12-29 ENCOUNTER — Non-Acute Institutional Stay: Payer: Medicare Other | Admitting: Hospice

## 2018-12-29 ENCOUNTER — Other Ambulatory Visit: Payer: Self-pay

## 2018-12-29 DIAGNOSIS — Z515 Encounter for palliative care: Secondary | ICD-10-CM

## 2018-12-29 DIAGNOSIS — F0390 Unspecified dementia without behavioral disturbance: Secondary | ICD-10-CM

## 2018-12-29 NOTE — Progress Notes (Signed)
[facility] Consult Note Telephone: [phone]  Fax: [phone]  PATIENT NAME: Nicole English DOB: 1933-01-09 MRN: [id number]  PRIMARY CARE PROVIDER:   Marvis Moeller, NP  REFERRING PROVIDER: Marvis Moeller, NP  RESPONSIBLE PARTY:   Son Vincie Linn Kincaid daughter in law [phone]  TELEHEALTH VISIT STATEMENT Due to the COVID-19 crisis, this visit was done via telemedicine from my office. It was initiated and consented to by this patient and/or family.  RECOMMENDATIONS/PLAN:   Advance Care Planning/Goals of Care: Telehealth Visit consisted of building trust and discussions on Palliative Medicine as specialized medical care for people living with serious illness, aimed at facilitating better quality of life through symptoms relief, assisting with advance care plan and establishing goals of care. Nicole English verbalized understanding and affirmed that patient is a DNR. Facility staff Nicole English affirmed DNR in the chart. DNR copy signed and uploaded in [place] today. Goals of care include to maximize quality of life and symptom management. Left a voicemail for Nicole English with call back number.  Symptom management: Memory loss related to Dementia at baseline. She continues on Memantine and Aricept. She is ambulatory, continent of bowel and bladder, FAST 6. She denied pain/discomfort. Nursing staff with no concerns/complaints. Encouraged ongoing supportive care.  Follow up: Palliative care will continue to follow patient for goals of care clarification and symptom management. I spent 30  minutes providing this consultation, from 10am to 11am. More than 50% of the time in this consultation was spent on coordinating communication  HISTORY OF PRESENT ILLNESS:  Nicole English is a 85 y.o. year old female with multiple medical problems including CVA, HTN Dysphagia, Depression CKD 4.  Palliative Care was asked to help address goals of  care.   CODE STATUS: DNR  PPS: 50% HOSPICE ELIGIBILITY/DIAGNOSIS: TBD  PAST MEDICAL HISTORY:  Past Medical History:  Diagnosis Date  . Abdominal aortic atherosclerosis ([place]) 07/2015   by CT  . Abnormal drug screen 12/2013   abnormally neg xanax (12/2013) and neg xanax and positive EtOH (04/2014), appropriate (08/2014)  . Allergic rhinitis   . Anxiety   . CKD (chronic kidney disease) stage 4, GFR 15-29 ml/min (HCC) 12/2012   per pt after brown recluse bite. sees Dr Juleen China, off ACEI, bp ok slightly elevated to maintain renal perfusion  . Cognitive decline   . 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 DDD (degenerative disc disease), lumbar   . Depression   . Diastolic dysfunction 0354   per echo in 11/2010 and again 03/2012; normal EF  . Diverticulosis    colonsocopy 2002 aborted 2/2 tortuous colon and heavy sigmoid diverticulosis  . Dysphagia    EGD 2012 - wnl, s/p esoph dilation  . GERD (gastroesophageal reflux disease)   . Glucose intolerance (impaired glucose tolerance)   . History of CVA (cerebrovascular accident) 2012   old per prior head CT  . History of diverticulitis of colon   . HLD (hyperlipidemia)   . HTN (hypertension)    Dr. Martinique, cards  . IBS (irritable bowel syndrome)   . Migraine   . Osteoporosis 08/2013   Spine -2.2, hip -3.5  . Rib fractures 10/11/2014  . Right upper lobe pneumonia ([place]) 03/25/2014  . SUI (stress urinary incontinence, female)    Dr. Amalia Hailey, urology (some urge as well)    SOCIAL HX:  Social History   Tobacco Use  . Smoking status: Former Smoker    Packs/day: 0.50    Years: 54.00    Pack years: 27.00    Types: Cigarettes    Start date: 01/18/1954    Quit date: 01/19/2008    Years since quitting: 10.9  . Smokeless tobacco: Never Used  Substance Use Topics  . Alcohol use: Yes    Alcohol/week: 0.0 standard drinks    Comment: rare    ALLERGIES:  Allergies   Allergen Reactions  . Amlodipine Other (See Comments)    edema  . Doxycycline Monohydrate Nausea And Vomiting  . Ramipril Other (See Comments)     Worsening renal function     PERTINENT MEDICATIONS:  Outpatient Encounter Medications as of 12/29/2018  Medication Sig  . aspirin EC 81 MG tablet Take 81 mg by mouth daily.  . calcitRIOL (ROCALTROL) 0.25 MCG capsule Take 0.25 mcg by mouth 3 (three) times a week.   . donepezil (ARICEPT) 10 MG tablet Take 1 tablet (10 mg total) by mouth at bedtime. (Patient not taking: Reported on 01/10/2018)  . furosemide (LASIX) 20 MG tablet Take 1 tablet (20 mg total) by mouth daily.  Marland Kitchen gabapentin (NEURONTIN) 100 MG capsule TAKE ONE CAPSULE BY MOUTH TWICE A DAY  . memantine (NAMENDA) 10 MG tablet TAKE 1 TABLET BY MOUTH TWICE A DAY  . metoprolol succinate (TOPROL-XL) 25 MG 24 hr tablet TAKE 0.5 TABLETS (12.5 MG TOTAL) BY MOUTH DAILY. *IN PLACE OF BISOPROLOL  . sertraline (ZOLOFT) 25 MG tablet Take 1 tablet (25 mg total) by mouth daily.  . SODIUM BICARBONATE PO Take 1 tablet by mouth 2 (two) times daily.  . traMADol (ULTRAM) 50 MG tablet Take 1 tablet (50 mg total) by mouth every 12 (twelve) hours as needed for moderate pain.   No facility-administered encounter medications on file as of 12/29/2018.    Teodoro Spray, NP

## 2019-01-03 DIAGNOSIS — Z1159 Encounter for screening for other viral diseases: Secondary | ICD-10-CM | POA: Diagnosis not present

## 2019-01-08 DIAGNOSIS — Z1159 Encounter for screening for other viral diseases: Secondary | ICD-10-CM | POA: Diagnosis not present

## 2019-01-15 DIAGNOSIS — Z1159 Encounter for screening for other viral diseases: Secondary | ICD-10-CM | POA: Diagnosis not present

## 2019-01-22 DIAGNOSIS — Z1159 Encounter for screening for other viral diseases: Secondary | ICD-10-CM | POA: Diagnosis not present

## 2019-01-25 DIAGNOSIS — Z1159 Encounter for screening for other viral diseases: Secondary | ICD-10-CM | POA: Diagnosis not present

## 2019-02-05 DIAGNOSIS — Z1159 Encounter for screening for other viral diseases: Secondary | ICD-10-CM | POA: Diagnosis not present

## 2019-02-08 DIAGNOSIS — Z1159 Encounter for screening for other viral diseases: Secondary | ICD-10-CM | POA: Diagnosis not present

## 2019-02-12 DIAGNOSIS — Z1159 Encounter for screening for other viral diseases: Secondary | ICD-10-CM | POA: Diagnosis not present

## 2019-02-15 DIAGNOSIS — Z1159 Encounter for screening for other viral diseases: Secondary | ICD-10-CM | POA: Diagnosis not present

## 2019-02-19 DIAGNOSIS — Z1159 Encounter for screening for other viral diseases: Secondary | ICD-10-CM | POA: Diagnosis not present

## 2019-02-22 DIAGNOSIS — Z1159 Encounter for screening for other viral diseases: Secondary | ICD-10-CM | POA: Diagnosis not present

## 2019-02-26 DIAGNOSIS — Z1159 Encounter for screening for other viral diseases: Secondary | ICD-10-CM | POA: Diagnosis not present

## 2019-03-01 DIAGNOSIS — Z1159 Encounter for screening for other viral diseases: Secondary | ICD-10-CM | POA: Diagnosis not present

## 2019-03-02 ENCOUNTER — Other Ambulatory Visit: Payer: Self-pay

## 2019-03-02 ENCOUNTER — Non-Acute Institutional Stay: Payer: Medicare Other | Admitting: Hospice

## 2019-03-02 DIAGNOSIS — Z515 Encounter for palliative care: Secondary | ICD-10-CM

## 2019-03-02 DIAGNOSIS — F0390 Unspecified dementia without behavioral disturbance: Secondary | ICD-10-CM

## 2019-03-02 NOTE — Progress Notes (Signed)
[facility] Consult Note Telephone: [phone]  Fax: [phone]  PATIENT NAME: Nicole English DOB: [birth year] MRN: [id number]  PRIMARY CARE PROVIDER:   Marvis Moeller, NP  REFERRING PROVIDER: Marvis Moeller, NP  RESPONSIBLE PARTY:   Son Areona Homer [place] daughter in law [phone]   RECOMMENDATIONS/PLAN:   Advance Care Planning/Goals of Care:  Visit consisted of building trust and and follow up on palliative care. Patient is a DNR. Goals of care include to maximize quality of life and symptom management. Called Pam and updated her on visit. Symptom management: Memory loss related to Dementia remains at baseline. Patient able to engage in fairly reasonable conversation, despite memory loss. She continues on Memantine and Aricept. She is ambulatory without a walker, continent of bowel and bladder, FAST 6. She denied pain/discomfort. Nursing staff with no concerns/complaints. Encouraged ongoing supportive care.  Follow up: Palliative care will continue to follow patient for goals of care clarification and symptom management. I spent 40 minutes providing this consultation. More than 50% of the time in this consultation was spent on coordinating communication  HISTORY OF PRESENT ILLNESS:  Nicole English is a 86 y.o. year old female with multiple medical problems including CVA, HTN Dysphagia, Depression CKD 4.  Palliative Care was asked to help address goals of care.  CODE STATUS: DNR  PPS: 60% HOSPICE ELIGIBILITY/DIAGNOSIS: TBD  PAST MEDICAL HISTORY:  Past Medical History:  Diagnosis Date  . Abdominal aortic atherosclerosis ([place]) 07/2015   by CT  . Abnormal drug screen 12/2013   abnormally neg xanax (12/2013) and neg xanax and positive EtOH (04/2014), appropriate (08/2014)  . Allergic rhinitis   . Anxiety   . CKD (chronic kidney disease) stage 4, GFR 15-29 ml/min (HCC) 12/2012   per pt after  brown recluse bite. sees Dr Juleen China, off ACEI, bp ok slightly elevated to maintain renal perfusion  . Cognitive decline   . 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 DDD (degenerative disc disease), lumbar   . Depression   . Diastolic dysfunction 3354   per echo in 11/2010 and again 03/2012; normal EF  . Diverticulosis    colonsocopy 2002 aborted 2/2 tortuous colon and heavy sigmoid diverticulosis  . Dysphagia    EGD 2012 - wnl, s/p esoph dilation  . GERD (gastroesophageal reflux disease)   . Glucose intolerance (impaired glucose tolerance)   . History of CVA (cerebrovascular accident) 2012   old per prior head CT  . History of diverticulitis of colon   . HLD (hyperlipidemia)   . HTN (hypertension)    Dr. Martinique, cards  . IBS (irritable bowel syndrome)   . Migraine   . Osteoporosis 08/2013   Spine -2.2, hip -3.5  . Rib fractures 10/11/2014  . Right upper lobe pneumonia ([place]) 03/25/2014  . SUI (stress urinary incontinence, female)    Dr. Amalia Hailey, urology (some urge as well)    SOCIAL HX:  Social History   Tobacco Use  . Smoking status: Former Smoker    Packs/day: 0.50    Years: 54.00    Pack years: 27.00    Types: Cigarettes    Start date: 01/18/1954    Quit date: 01/19/2008    Years since quitting: 11.1  . Smokeless tobacco: Never Used  Substance Use Topics  . Alcohol use: Yes    Alcohol/week: 0.0  standard drinks    Comment: rare    ALLERGIES:  Allergies  Allergen Reactions  . Amlodipine Other (See Comments)    edema  . Doxycycline Monohydrate Nausea And Vomiting  . Ramipril Other (See Comments)     Worsening renal function     PERTINENT MEDICATIONS:  Outpatient Encounter Medications as of 03/02/2019  Medication Sig  . aspirin EC 81 MG tablet Take 81 mg by mouth daily.  . calcitRIOL (ROCALTROL) 0.25 MCG capsule Take 0.25 mcg by mouth 3 (three) times a week.   . donepezil (ARICEPT) 10 MG  tablet Take 1 tablet (10 mg total) by mouth at bedtime. (Patient not taking: Reported on 01/10/2018)  . furosemide (LASIX) 20 MG tablet Take 1 tablet (20 mg total) by mouth daily.  Marland Kitchen gabapentin (NEURONTIN) 100 MG capsule TAKE ONE CAPSULE BY MOUTH TWICE A DAY  . memantine (NAMENDA) 10 MG tablet TAKE 1 TABLET BY MOUTH TWICE A DAY  . metoprolol succinate (TOPROL-XL) 25 MG 24 hr tablet TAKE 0.5 TABLETS (12.5 MG TOTAL) BY MOUTH DAILY. *IN PLACE OF BISOPROLOL  . sertraline (ZOLOFT) 25 MG tablet Take 1 tablet (25 mg total) by mouth daily.  . SODIUM BICARBONATE PO Take 1 tablet by mouth 2 (two) times daily.  . traMADol (ULTRAM) 50 MG tablet Take 1 tablet (50 mg total) by mouth every 12 (twelve) hours as needed for moderate pain.   No facility-administered encounter medications on file as of 03/02/2019.    PHYSICAL EXAM:   General: NAD, pleasant, thin Cardiovascular: regular rate and rhythm Pulmonary: clear ant fields Abdomen: soft, nontender, + bowel sounds GU: no suprapubic tenderness Extremities: no edema, no joint deformities Skin: no rashes to exposed skin Neurological: Weakness but otherwise nonfocal  Teodoro Spray, NP

## 2019-03-05 DIAGNOSIS — Z1159 Encounter for screening for other viral diseases: Secondary | ICD-10-CM | POA: Diagnosis not present

## 2019-03-08 DIAGNOSIS — Z1159 Encounter for screening for other viral diseases: Secondary | ICD-10-CM | POA: Diagnosis not present

## 2019-03-12 DIAGNOSIS — Z1159 Encounter for screening for other viral diseases: Secondary | ICD-10-CM | POA: Diagnosis not present

## 2019-03-15 DIAGNOSIS — Z1159 Encounter for screening for other viral diseases: Secondary | ICD-10-CM | POA: Diagnosis not present

## 2019-03-19 DIAGNOSIS — Z1159 Encounter for screening for other viral diseases: Secondary | ICD-10-CM | POA: Diagnosis not present

## 2019-03-22 DIAGNOSIS — Z1159 Encounter for screening for other viral diseases: Secondary | ICD-10-CM | POA: Diagnosis not present

## 2019-03-26 DIAGNOSIS — Z1159 Encounter for screening for other viral diseases: Secondary | ICD-10-CM | POA: Diagnosis not present

## 2019-03-29 DIAGNOSIS — Z1159 Encounter for screening for other viral diseases: Secondary | ICD-10-CM | POA: Diagnosis not present

## 2019-04-02 DIAGNOSIS — Z1159 Encounter for screening for other viral diseases: Secondary | ICD-10-CM | POA: Diagnosis not present

## 2019-04-05 DIAGNOSIS — Z1159 Encounter for screening for other viral diseases: Secondary | ICD-10-CM | POA: Diagnosis not present

## 2019-04-09 DIAGNOSIS — Z1159 Encounter for screening for other viral diseases: Secondary | ICD-10-CM | POA: Diagnosis not present

## 2019-04-12 DIAGNOSIS — Z1159 Encounter for screening for other viral diseases: Secondary | ICD-10-CM | POA: Diagnosis not present

## 2019-04-16 DIAGNOSIS — Z1159 Encounter for screening for other viral diseases: Secondary | ICD-10-CM | POA: Diagnosis not present

## 2019-04-19 DIAGNOSIS — Z1159 Encounter for screening for other viral diseases: Secondary | ICD-10-CM | POA: Diagnosis not present

## 2019-04-23 DIAGNOSIS — Z1159 Encounter for screening for other viral diseases: Secondary | ICD-10-CM | POA: Diagnosis not present

## 2019-04-26 DIAGNOSIS — Z1159 Encounter for screening for other viral diseases: Secondary | ICD-10-CM | POA: Diagnosis not present

## 2019-04-30 DIAGNOSIS — Z1159 Encounter for screening for other viral diseases: Secondary | ICD-10-CM | POA: Diagnosis not present

## 2019-05-03 DIAGNOSIS — Z1159 Encounter for screening for other viral diseases: Secondary | ICD-10-CM | POA: Diagnosis not present

## 2019-05-07 DIAGNOSIS — I639 Cerebral infarction, unspecified: Secondary | ICD-10-CM | POA: Diagnosis not present

## 2019-05-07 DIAGNOSIS — Z1159 Encounter for screening for other viral diseases: Secondary | ICD-10-CM | POA: Diagnosis not present

## 2019-05-07 DIAGNOSIS — I129 Hypertensive chronic kidney disease with stage 1 through stage 4 chronic kidney disease, or unspecified chronic kidney disease: Secondary | ICD-10-CM | POA: Diagnosis not present

## 2019-05-07 DIAGNOSIS — N184 Chronic kidney disease, stage 4 (severe): Secondary | ICD-10-CM | POA: Diagnosis not present

## 2019-05-07 DIAGNOSIS — I509 Heart failure, unspecified: Secondary | ICD-10-CM | POA: Diagnosis not present

## 2019-05-10 DIAGNOSIS — Z1159 Encounter for screening for other viral diseases: Secondary | ICD-10-CM | POA: Diagnosis not present

## 2019-05-11 DIAGNOSIS — G301 Alzheimer's disease with late onset: Secondary | ICD-10-CM | POA: Diagnosis not present

## 2019-05-14 DIAGNOSIS — Z1159 Encounter for screening for other viral diseases: Secondary | ICD-10-CM | POA: Diagnosis not present

## 2019-05-17 DIAGNOSIS — Z1159 Encounter for screening for other viral diseases: Secondary | ICD-10-CM | POA: Diagnosis not present

## 2019-05-18 ENCOUNTER — Other Ambulatory Visit: Payer: Self-pay

## 2019-05-18 ENCOUNTER — Non-Acute Institutional Stay: Payer: Medicare Other | Admitting: Hospice

## 2019-05-18 DIAGNOSIS — Z515 Encounter for palliative care: Secondary | ICD-10-CM

## 2019-05-18 DIAGNOSIS — F0390 Unspecified dementia without behavioral disturbance: Secondary | ICD-10-CM

## 2019-05-18 NOTE — Progress Notes (Signed)
[facility] Consult Note Telephone: [phone]  Fax: [phone]  PATIENT NAME: Nicole English DOB: [birth year] MRN: [id number]  PRIMARY CARE PROVIDER: Marvis Moeller NP. REFERRING PROVIDER: Marvis Moeller NP.  RESPONSIBLE PARTY:Son - [name] daughter in law [phone]- can call her to update  .  RECOMMENDATIONS/PLAN:   Advance Care Planning/Goals of Care: Visit consisted of building trust and  following up on palliative care. Patient is a DNR.  Goals of care include to maximize quality of life and symptom management.  Symptom management: Patient denies pain discomfort.  She denies coughing or respiratory distress.  She was seen interacting with other residents at the dining area.  She ambulates without rolling walker.  Nursing staff with no report of fall or medical acuity.  Ongoing memory loss and confusion related to dementia remains at baseline;  she is sometimes incontinent of urine FAST 6C.  This is a functional decline from last visit when she was FAST 6.  She is pleasant, cooperative and in no acute distress.  She is compliant with her medications.  Nursing staff with no concerns at this time. Follow up: Palliative care will continue to follow patient for goals of care clarification and symptom management. I spent 48  minutes providing this consultation; time includes chart review and documentation. More than 50% of the time in this consultation was spent on coordinating communication  HISTORY OF PRESENT ILLNESS:Nicole English is a 86 year old female with multiple medical problems including CVA, HTN, DYSPHAGIA, depression, CKD.  Palliative care was asked to help address goals of care  CODE STATUS: DNR  PPS: 60% HOSPICE ELIGIBILITY/DIAGNOSIS: TBD  PAST MEDICAL HISTORY:  Past Medical History:  Diagnosis Date  . Abdominal aortic atherosclerosis ([place]) 07/2015   by CT  . Abnormal drug  screen 12/2013   abnormally neg xanax (12/2013) and neg xanax and positive EtOH (04/2014), appropriate (08/2014)  . Allergic rhinitis   . Anxiety   . CKD (chronic kidney disease) stage 4, GFR 15-29 ml/min (HCC) 12/2012   per pt after brown recluse bite. sees Dr Juleen China, off ACEI, bp ok slightly elevated to maintain renal perfusion  . Cognitive decline   . 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 DDD (degenerative disc disease), lumbar   . Depression   . Diastolic dysfunction 4742   per echo in 11/2010 and again 03/2012; normal EF  . Diverticulosis    colonsocopy 2002 aborted 2/2 tortuous colon and heavy sigmoid diverticulosis  . Dysphagia    EGD 2012 - wnl, s/p esoph dilation  . GERD (gastroesophageal reflux disease)   . Glucose intolerance (impaired glucose tolerance)   . History of CVA (cerebrovascular accident) 2012   old per prior head CT  . History of diverticulitis of colon   . HLD (hyperlipidemia)   . HTN (hypertension)    Dr. Martinique, cards  . IBS (irritable bowel syndrome)   . Migraine   . Osteoporosis 08/2013   Spine -2.2, hip -3.5  . Rib fractures 10/11/2014  . Right upper lobe pneumonia ([place]) 03/25/2014  . SUI (stress urinary incontinence, female)    Dr. Amalia Hailey, urology (some urge as well)    SOCIAL HX:  Social History   Tobacco Use  . Smoking status: Former Smoker    Packs/day: 0.50    Years: 54.00    Pack years: 27.00  Types: Cigarettes    Start date: 01/18/1954    Quit date: 01/19/2008    Years since quitting: 11.3  . Smokeless tobacco: Never Used  Substance Use Topics  . Alcohol use: Yes    Alcohol/week: 0.0 standard drinks    Comment: rare    ALLERGIES:  Allergies  Allergen Reactions  . Amlodipine Other (See Comments)    edema  . Doxycycline Monohydrate Nausea And Vomiting  . Ramipril Other (See Comments)     Worsening renal function     PERTINENT MEDICATIONS:  Outpatient  Encounter Medications as of 05/18/2019  Medication Sig  . aspirin EC 81 MG tablet Take 81 mg by mouth daily.  . calcitRIOL (ROCALTROL) 0.25 MCG capsule Take 0.25 mcg by mouth 3 (three) times a week.   . donepezil (ARICEPT) 10 MG tablet Take 1 tablet (10 mg total) by mouth at bedtime. (Patient not taking: Reported on 01/10/2018)  . furosemide (LASIX) 20 MG tablet Take 1 tablet (20 mg total) by mouth daily.  Marland Kitchen gabapentin (NEURONTIN) 100 MG capsule TAKE ONE CAPSULE BY MOUTH TWICE A DAY  . memantine (NAMENDA) 10 MG tablet TAKE 1 TABLET BY MOUTH TWICE A DAY  . metoprolol succinate (TOPROL-XL) 25 MG 24 hr tablet TAKE 0.5 TABLETS (12.5 MG TOTAL) BY MOUTH DAILY. *IN PLACE OF BISOPROLOL  . sertraline (ZOLOFT) 25 MG tablet Take 1 tablet (25 mg total) by mouth daily.  . SODIUM BICARBONATE PO Take 1 tablet by mouth 2 (two) times daily.  . traMADol (ULTRAM) 50 MG tablet Take 1 tablet (50 mg total) by mouth every 12 (twelve) hours as needed for moderate pain.   No facility-administered encounter medications on file as of 05/18/2019.    PHYSICAL EXAM/ROS  General: NAD, frail appearing, thin, cooperative. Cardiovascular: regular rate and rhythm; denies chest pain. Pulmonary: clear ant/post fields; normal respiratory effort. Abdomen: soft, nontender, + bowel sounds GU: no suprapubic tenderness Extremities: no edema, no joint deformities Skin: no rashes on exposed skin. Neurological: Weakness but otherwise nonfocal  Teodoro Spray, NP

## 2019-05-21 DIAGNOSIS — Z1159 Encounter for screening for other viral diseases: Secondary | ICD-10-CM | POA: Diagnosis not present

## 2019-05-24 DIAGNOSIS — Z1159 Encounter for screening for other viral diseases: Secondary | ICD-10-CM | POA: Diagnosis not present

## 2019-05-28 DIAGNOSIS — Z1159 Encounter for screening for other viral diseases: Secondary | ICD-10-CM | POA: Diagnosis not present

## 2019-05-31 DIAGNOSIS — Z1159 Encounter for screening for other viral diseases: Secondary | ICD-10-CM | POA: Diagnosis not present

## 2019-06-04 DIAGNOSIS — Z1159 Encounter for screening for other viral diseases: Secondary | ICD-10-CM | POA: Diagnosis not present

## 2019-06-07 DIAGNOSIS — Z1159 Encounter for screening for other viral diseases: Secondary | ICD-10-CM | POA: Diagnosis not present

## 2019-06-11 DIAGNOSIS — Z1159 Encounter for screening for other viral diseases: Secondary | ICD-10-CM | POA: Diagnosis not present

## 2019-06-14 DIAGNOSIS — Z1159 Encounter for screening for other viral diseases: Secondary | ICD-10-CM | POA: Diagnosis not present

## 2019-06-18 DIAGNOSIS — Z1159 Encounter for screening for other viral diseases: Secondary | ICD-10-CM | POA: Diagnosis not present

## 2019-06-19 DIAGNOSIS — B351 Tinea unguium: Secondary | ICD-10-CM | POA: Diagnosis not present

## 2019-06-19 DIAGNOSIS — Q845 Enlarged and hypertrophic nails: Secondary | ICD-10-CM | POA: Diagnosis not present

## 2019-06-19 DIAGNOSIS — I739 Peripheral vascular disease, unspecified: Secondary | ICD-10-CM | POA: Diagnosis not present

## 2019-06-21 DIAGNOSIS — Z1159 Encounter for screening for other viral diseases: Secondary | ICD-10-CM | POA: Diagnosis not present

## 2019-06-25 DIAGNOSIS — Z1159 Encounter for screening for other viral diseases: Secondary | ICD-10-CM | POA: Diagnosis not present

## 2019-06-28 DIAGNOSIS — Z1159 Encounter for screening for other viral diseases: Secondary | ICD-10-CM | POA: Diagnosis not present

## 2019-07-02 DIAGNOSIS — Z1159 Encounter for screening for other viral diseases: Secondary | ICD-10-CM | POA: Diagnosis not present

## 2019-07-03 ENCOUNTER — Other Ambulatory Visit: Payer: Self-pay

## 2019-07-03 ENCOUNTER — Non-Acute Institutional Stay: Payer: Medicare Other | Admitting: Hospice

## 2019-07-03 DIAGNOSIS — F0390 Unspecified dementia without behavioral disturbance: Secondary | ICD-10-CM

## 2019-07-03 DIAGNOSIS — Z515 Encounter for palliative care: Secondary | ICD-10-CM | POA: Diagnosis not present

## 2019-07-03 NOTE — Progress Notes (Signed)
[facility] Consult Note Telephone: [phone]  Fax: [phone]  PATIENT NAME: Nicole English DOB: July 31, 1932 MRN: [id number]  PRIMARY CARE PROVIDER: Marvis Moeller NP. REFERRING PROVIDER: Marvis Moeller NP.  RESPONSIBLE PARTY:Son - Nicholaus Corolla [place] daughter in law [phone]- can call her to update  .  RECOMMENDATIONS/PLAN:   Advance Care Planning/Goals of Care: Visit is to follow up on palliative care. Patient is a DNR.  Goals of care include to maximize quality of life and symptom management.  Symptom management:  Patient resting in her room during visit, denies pain discomfort.  FLACC 0 she denies coughing or respiratory distress. No significant changes since last visit, no hospitalization.  Ongoing memory loss and confusion related to dementia remains at baseline;  she is sometimes incontinent of urine FAST 6C. She is pleasant, cooperative and in no acute distress.   Nursing staff with no concerns at this time. She is compliant with her medications.  Encouraged ongoing nursing care. Follow up: Palliative care will continue to follow patient for goals of care clarification and symptom management. I spent  50  minutes providing this consultation; time includes chart review and documentation. More than 50% of the time in this consultation was spent on coordinating communication  HISTORY OF PRESENT ILLNESS:Nicole English is a 86 year old female with multiple medical problems including CVA, HTN, DYSPHAGIA, depression, CKD.  Palliative care was asked to help address goals of care  CODE STATUS: DNR  PPS: 60% HOSPICE ELIGIBILITY/DIAGNOSIS: TBD  PAST MEDICAL HISTORY:  Past Medical History:  Diagnosis Date  . Abdominal aortic atherosclerosis ([place]) 07/2015   by CT  . Abnormal drug screen 12/2013   abnormally neg xanax (12/2013) and neg xanax and positive EtOH (04/2014), appropriate (08/2014)  .  Allergic rhinitis   . Anxiety   . CKD (chronic kidney disease) stage 4, GFR 15-29 ml/min (HCC) 12/2012   per pt after brown recluse bite. sees Dr Juleen China, off ACEI, bp ok slightly elevated to maintain renal perfusion  . Cognitive decline   . 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 DDD (degenerative disc disease), lumbar   . Depression   . Diastolic dysfunction 2353   per echo in 11/2010 and again 03/2012; normal EF  . Diverticulosis    colonsocopy 2002 aborted 2/2 tortuous colon and heavy sigmoid diverticulosis  . Dysphagia    EGD 2012 - wnl, s/p esoph dilation  . GERD (gastroesophageal reflux disease)   . Glucose intolerance (impaired glucose tolerance)   . History of CVA (cerebrovascular accident) 2012   old per prior head CT  . History of diverticulitis of colon   . HLD (hyperlipidemia)   . HTN (hypertension)    Dr. Martinique, cards  . IBS (irritable bowel syndrome)   . Migraine   . Osteoporosis 08/2013   Spine -2.2, hip -3.5  . Rib fractures 10/11/2014  . Right upper lobe pneumonia ([place]) 03/25/2014  . SUI (stress urinary incontinence, female)    Dr. Amalia Hailey, urology (some urge as well)    SOCIAL HX:  Social History   Tobacco Use  . Smoking status: Former Smoker    Packs/day: 0.50    Years: 54.00    Pack years: 27.00    Types: Cigarettes    Start date: 01/18/1954    Quit date: 01/19/2008    Years since quitting: 11.4  .  Smokeless tobacco: Never Used  Substance Use Topics  . Alcohol use: Yes    Alcohol/week: 0.0 standard drinks    Comment: rare    ALLERGIES:  Allergies  Allergen Reactions  . Amlodipine Other (See Comments)    edema  . Doxycycline Monohydrate Nausea And Vomiting  . Ramipril Other (See Comments)     Worsening renal function     PERTINENT MEDICATIONS:  Outpatient Encounter Medications as of 07/03/2019  Medication Sig  . aspirin EC 81 MG tablet Take 81 mg by mouth daily.  .  calcitRIOL (ROCALTROL) 0.25 MCG capsule Take 0.25 mcg by mouth 3 (three) times a week.   . donepezil (ARICEPT) 10 MG tablet Take 1 tablet (10 mg total) by mouth at bedtime. (Patient not taking: Reported on 01/10/2018)  . furosemide (LASIX) 20 MG tablet Take 1 tablet (20 mg total) by mouth daily.  Marland Kitchen gabapentin (NEURONTIN) 100 MG capsule TAKE ONE CAPSULE BY MOUTH TWICE A DAY  . memantine (NAMENDA) 10 MG tablet TAKE 1 TABLET BY MOUTH TWICE A DAY  . metoprolol succinate (TOPROL-XL) 25 MG 24 hr tablet TAKE 0.5 TABLETS (12.5 MG TOTAL) BY MOUTH DAILY. *IN PLACE OF BISOPROLOL  . sertraline (ZOLOFT) 25 MG tablet Take 1 tablet (25 mg total) by mouth daily.  . SODIUM BICARBONATE PO Take 1 tablet by mouth 2 (two) times daily.  . traMADol (ULTRAM) 50 MG tablet Take 1 tablet (50 mg total) by mouth every 12 (twelve) hours as needed for moderate pain.   No facility-administered encounter medications on file as of 07/03/2019.    PHYSICAL EXAM/ROS  General: NAD, frail appearing, thin, cooperative. Cardiovascular: regular rate and rhythm; denies chest pain. Pulmonary: clear ant/post fields; normal respiratory effort; no adventitious sounds auscultated Abdomen: soft, nontender, + bowel sounds GU: no suprapubic tenderness Extremities: no edema, no joint deformities Skin: no rashes on exposed skin. Neurological: Weakness but otherwise nonfocal  Teodoro Spray, NP

## 2019-07-05 DIAGNOSIS — Z1159 Encounter for screening for other viral diseases: Secondary | ICD-10-CM | POA: Diagnosis not present

## 2019-07-09 DIAGNOSIS — Z1159 Encounter for screening for other viral diseases: Secondary | ICD-10-CM | POA: Diagnosis not present

## 2019-07-12 DIAGNOSIS — Z1159 Encounter for screening for other viral diseases: Secondary | ICD-10-CM | POA: Diagnosis not present

## 2019-07-16 DIAGNOSIS — Z1159 Encounter for screening for other viral diseases: Secondary | ICD-10-CM | POA: Diagnosis not present

## 2019-07-19 DIAGNOSIS — Z1159 Encounter for screening for other viral diseases: Secondary | ICD-10-CM | POA: Diagnosis not present

## 2019-07-23 DIAGNOSIS — Z1159 Encounter for screening for other viral diseases: Secondary | ICD-10-CM | POA: Diagnosis not present

## 2019-07-26 DIAGNOSIS — Z1159 Encounter for screening for other viral diseases: Secondary | ICD-10-CM | POA: Diagnosis not present

## 2019-07-30 DIAGNOSIS — Z1159 Encounter for screening for other viral diseases: Secondary | ICD-10-CM | POA: Diagnosis not present

## 2019-07-31 ENCOUNTER — Other Ambulatory Visit: Payer: Self-pay

## 2019-07-31 ENCOUNTER — Non-Acute Institutional Stay: Payer: Medicare Other | Admitting: Hospice

## 2019-07-31 DIAGNOSIS — Z515 Encounter for palliative care: Secondary | ICD-10-CM | POA: Diagnosis not present

## 2019-07-31 DIAGNOSIS — F0390 Unspecified dementia without behavioral disturbance: Secondary | ICD-10-CM

## 2019-07-31 NOTE — Progress Notes (Signed)
[place] Consult Note Telephone: [phone]  Fax: [phone]  [name] DOB:01/30/1932 HMC:[id number]  PRIMARY CARE PROVIDER:JacquelineThigpenNP. REFERRING PROVIDER:JacquelineThigpenNP.  RESPONSIBLE PARTY:Son - Nicole English daughter in law [phone]- can call her to update  .  RECOMMENDATIONS/PLAN:  Advance Care Planning/Goals of Care:Visit is to follow up on palliative care. Patient remains a DNR. Visit consisted of counseling and education dealing with the complex and emotionally intense issues of symptom management and palliative care in the setting of serious and potentially life-threatening illness. Goals of care include to maximize quality of life and symptom management.  Symptom management: Patient in no acute distress, pleasant, cooperative,resting in her room during visit, denies pain discomfort.   She followed the NP to dining for breakfast; fed self with no difficulty, FLACC 0; no coughing or respiratory distress. No significant changes since last visit, no hospitalization.  Ongoing memory loss and confusion related to dementia remains at baseline;she is sometimes incontinent of urine FAST6C.Nursing staff Nicole English with no concerns or complaints at this time. She is compliant with her medications.  Encouraged ongoing nursing care. Follow Nicole English care will continue to follow patient for goals of care clarification and symptom management. I spent65minutes providing this consultation; time includes chart review and documentation. More than 50% of the time in this consultation was spent on coordinating communication  HISTORY OF PRESENT ILLNESS:Nicole English is a 86 year old female with multiple medical problems including CVA, HTN, DYSPHAGIA, depression, CKD. Palliative care was asked to help address goals of care  CODE  STATUS:DNR  PPS:60% HOSPICE ELIGIBILITY/DIAGNOSIS: TBD  PAST MEDICAL HISTORY:  Past Medical History:  Diagnosis Date  . Abdominal aortic atherosclerosis ([place]) 07/2015   by CT  . Abnormal drug screen 12/2013   abnormally neg xanax (12/2013) and neg xanax and positive EtOH (04/2014), appropriate (08/2014)  . Allergic rhinitis   . Anxiety   . CKD (chronic kidney disease) stage 4, GFR 15-29 ml/min ([facility]) 12/2012   per pt after brown recluse bite. sees Dr Juleen China, off ACEI, bp ok slightly elevated to maintain renal perfusion  . Cognitive decline   . 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 DDD (degenerative disc disease), lumbar   . Depression   . Diastolic dysfunction 5170   per echo in 11/2010 and again 03/2012; normal EF  . Diverticulosis    colonsocopy 2002 aborted 2/2 tortuous colon and heavy sigmoid diverticulosis  . Dysphagia    EGD 2012 - wnl, s/p esoph dilation  . GERD (gastroesophageal reflux disease)   . Glucose intolerance (impaired glucose tolerance)   . History of CVA (cerebrovascular accident) 2012   old per prior head CT  . History of diverticulitis of colon   . HLD (hyperlipidemia)   . HTN (hypertension)    Dr. [place], cards  . IBS (irritable bowel syndrome)   . Migraine   . Osteoporosis 08/2013   Spine -2.2, hip -3.5  . Rib fractures 10/11/2014  . Right upper lobe pneumonia ([place]) 03/25/2014  . SUI (stress urinary incontinence, female)    Dr. Amalia Hailey, urology (some urge as well)    SOCIAL HX:  Social History   Tobacco Use  . Smoking status: Former Smoker    Packs/day: 0.50    Years: 54.00    Pack years: 27.00    Types: Cigarettes    Start date: 01/18/1954  Quit date: 01/19/2008    Years since quitting: 11.5  . Smokeless tobacco: Never Used  Substance Use Topics  . Alcohol use: Yes    Alcohol/week: 0.0 standard drinks    Comment: rare    ALLERGIES:  Allergies  Allergen  Reactions  . Amlodipine Other (See Comments)    edema  . Doxycycline Monohydrate Nausea And Vomiting  . Ramipril Other (See Comments)     Worsening renal function     PERTINENT MEDICATIONS:  Outpatient Encounter Medications as of 07/31/2019  Medication Sig  . aspirin EC 81 MG tablet Take 81 mg by mouth daily.  . calcitRIOL (ROCALTROL) 0.25 MCG capsule Take 0.25 mcg by mouth 3 (three) times a week.   . donepezil (ARICEPT) 10 MG tablet Take 1 tablet (10 mg total) by mouth at bedtime. (Patient not taking: Reported on 01/10/2018)  . furosemide (LASIX) 20 MG tablet Take 1 tablet (20 mg total) by mouth daily.  Marland Kitchen gabapentin (NEURONTIN) 100 MG capsule TAKE ONE CAPSULE BY MOUTH TWICE A DAY  . memantine (NAMENDA) 10 MG tablet TAKE 1 TABLET BY MOUTH TWICE A DAY  . metoprolol succinate (TOPROL-XL) 25 MG 24 hr tablet TAKE 0.5 TABLETS (12.5 MG TOTAL) BY MOUTH DAILY. *IN PLACE OF BISOPROLOL  . sertraline (ZOLOFT) 25 MG tablet Take 1 tablet (25 mg total) by mouth daily.  . SODIUM BICARBONATE PO Take 1 tablet by mouth 2 (two) times daily.  . traMADol (ULTRAM) 50 MG tablet Take 1 tablet (50 mg total) by mouth every 12 (twelve) hours as needed for moderate pain.   No facility-administered encounter medications on file as of 07/31/2019.    PHYSICAL EXAM/ROS:  General: NAD, frail appearing, thin,cooperative. Cardiovascular: regular rate and rhythm;denies chest pain. Pulmonary: clear ant/postfields;normal respiratory effort; no adventitious sounds auscultated Abdomen: soft, nontender, + bowel sounds GU: no suprapubic tenderness Extremities: no edema, no joint deformities Skin: no rasheson exposed skin. Neurological: Weakness but otherwise nonfocal  Teodoro Spray, NP

## 2019-08-02 DIAGNOSIS — Z1159 Encounter for screening for other viral diseases: Secondary | ICD-10-CM | POA: Diagnosis not present

## 2019-08-06 DIAGNOSIS — Z1159 Encounter for screening for other viral diseases: Secondary | ICD-10-CM | POA: Diagnosis not present

## 2019-08-09 DIAGNOSIS — Z1159 Encounter for screening for other viral diseases: Secondary | ICD-10-CM | POA: Diagnosis not present

## 2019-08-13 DIAGNOSIS — Z1159 Encounter for screening for other viral diseases: Secondary | ICD-10-CM | POA: Diagnosis not present

## 2019-08-16 DIAGNOSIS — Z1159 Encounter for screening for other viral diseases: Secondary | ICD-10-CM | POA: Diagnosis not present

## 2019-08-20 DIAGNOSIS — Z1159 Encounter for screening for other viral diseases: Secondary | ICD-10-CM | POA: Diagnosis not present

## 2019-08-23 DIAGNOSIS — Z1159 Encounter for screening for other viral diseases: Secondary | ICD-10-CM | POA: Diagnosis not present

## 2019-08-27 DIAGNOSIS — Z1159 Encounter for screening for other viral diseases: Secondary | ICD-10-CM | POA: Diagnosis not present

## 2019-08-30 DIAGNOSIS — Z1159 Encounter for screening for other viral diseases: Secondary | ICD-10-CM | POA: Diagnosis not present

## 2019-09-03 DIAGNOSIS — Z1159 Encounter for screening for other viral diseases: Secondary | ICD-10-CM | POA: Diagnosis not present

## 2019-09-06 DIAGNOSIS — Z1159 Encounter for screening for other viral diseases: Secondary | ICD-10-CM | POA: Diagnosis not present

## 2019-09-10 DIAGNOSIS — Z1159 Encounter for screening for other viral diseases: Secondary | ICD-10-CM | POA: Diagnosis not present

## 2019-09-13 DIAGNOSIS — Z1159 Encounter for screening for other viral diseases: Secondary | ICD-10-CM | POA: Diagnosis not present

## 2019-09-17 DIAGNOSIS — Z1159 Encounter for screening for other viral diseases: Secondary | ICD-10-CM | POA: Diagnosis not present

## 2019-09-20 DIAGNOSIS — Z1159 Encounter for screening for other viral diseases: Secondary | ICD-10-CM | POA: Diagnosis not present

## 2019-09-24 DIAGNOSIS — Z1159 Encounter for screening for other viral diseases: Secondary | ICD-10-CM | POA: Diagnosis not present

## 2019-09-27 DIAGNOSIS — Z1159 Encounter for screening for other viral diseases: Secondary | ICD-10-CM | POA: Diagnosis not present

## 2019-10-01 DIAGNOSIS — Z1159 Encounter for screening for other viral diseases: Secondary | ICD-10-CM | POA: Diagnosis not present

## 2019-10-04 DIAGNOSIS — Z1159 Encounter for screening for other viral diseases: Secondary | ICD-10-CM | POA: Diagnosis not present

## 2019-10-08 DIAGNOSIS — Z1159 Encounter for screening for other viral diseases: Secondary | ICD-10-CM | POA: Diagnosis not present

## 2019-10-11 DIAGNOSIS — Z1159 Encounter for screening for other viral diseases: Secondary | ICD-10-CM | POA: Diagnosis not present

## 2019-10-15 DIAGNOSIS — N186 End stage renal disease: Secondary | ICD-10-CM | POA: Diagnosis not present

## 2019-10-15 DIAGNOSIS — I509 Heart failure, unspecified: Secondary | ICD-10-CM | POA: Diagnosis not present

## 2019-10-15 DIAGNOSIS — I129 Hypertensive chronic kidney disease with stage 1 through stage 4 chronic kidney disease, or unspecified chronic kidney disease: Secondary | ICD-10-CM | POA: Diagnosis not present

## 2019-10-15 DIAGNOSIS — Z8673 Personal history of transient ischemic attack (TIA), and cerebral infarction without residual deficits: Secondary | ICD-10-CM | POA: Diagnosis not present

## 2019-10-19 DEATH — deceased
